# Patient Record
Sex: Male | Born: 1960 | Race: White | Hispanic: No | Marital: Single | Smoking: Current every day smoker
Health system: Southern US, Community
[De-identification: ages and names within clinical notes are randomized; demographics above are authoritative.]

## PROBLEM LIST (undated history)

## (undated) ENCOUNTER — Emergency Department (HOSPITAL_COMMUNITY): Admission: EM | Payer: 59 | Source: Home / Self Care

## (undated) DIAGNOSIS — N489 Disorder of penis, unspecified: Secondary | ICD-10-CM

## (undated) HISTORY — PX: NO PAST SURGERIES: SHX2092

---

## 2015-10-24 ENCOUNTER — Ambulatory Visit (INDEPENDENT_AMBULATORY_CARE_PROVIDER_SITE_OTHER): Payer: Managed Care, Other (non HMO) | Admitting: Physician Assistant

## 2015-10-24 ENCOUNTER — Other Ambulatory Visit: Payer: Self-pay

## 2015-10-24 VITALS — BP 150/100 | HR 97 | Temp 98.3°F | Resp 16 | Ht 69.5 in | Wt 144.8 lb

## 2015-10-24 DIAGNOSIS — L723 Sebaceous cyst: Principal | ICD-10-CM

## 2015-10-24 DIAGNOSIS — L089 Local infection of the skin and subcutaneous tissue, unspecified: Secondary | ICD-10-CM

## 2015-10-24 MED ORDER — DOXYCYCLINE HYCLATE 100 MG PO TABS: 100.0000 mg | ORAL_TABLET | Freq: Two times a day (BID) | ORAL | Status: DC

## 2015-10-24 NOTE — Telephone Encounter (Signed)
Rx sent to [facility], wrong pharmacy.

## 2015-10-24 NOTE — Patient Instructions (Addendum)
Warm compresses Finish all of the antibiotic Pull the packing out in 3 days but please change the dressing daily    IF you received an x-ray today, you will receive an invoice from [facility]. Please contact [facility] at [phone] with questions or concerns regarding your invoice.   IF you received labwork today, you will receive an invoice from [employer]. Please contact [facility] at [phone] with questions or concerns regarding your invoice.   Our billing staff will not be able to assist you with questions regarding bills from these companies.  You will be contacted with the lab results as soon as they are available. The fastest way to get your results is to activate your My Chart account. Instructions are located on the last page of this paperwork. If you have not heard from [place] regarding the results in 2 weeks, please contact this office.

## 2015-10-24 NOTE — Progress Notes (Signed)
   David Townsend  MRN: [id number] DOB: 26-Jul-1960  Subjective:  Pt presents to clinic with area on his left groin that he has had for several days - it is an infection - started as a small red bump but it has gotten bigger and more painful.  He has tried to open it at home and he has not been successful - the area is getting bigger.  He thinks it may have started as an ingrown hair.  There are no active problems to display for this patient.   No current outpatient prescriptions on file prior to visit.   No current facility-administered medications on file prior to visit.    Allergies  Allergen Reactions  . Penicillins     whelps    Review of Systems  Constitutional: Negative for fever and chills.  Skin: Positive for wound.   Objective:  BP 150/100 mmHg  Pulse 97  Temp(Src) 98.3 F (36.8 C) (Oral)  Resp 16  Ht 5' 9.5" (1.765 m)  Wt 144 lb 12.8 oz (65.681 kg)  BMI 21.08 kg/m2  SpO2 99%  Physical Exam  Constitutional: He is oriented to person, place, and time and well-developed, well-nourished, and in no distress.  HENT:  Head: Normocephalic and atraumatic.  Right Ear: External ear normal.  Left Ear: External ear normal.  Eyes: Conjunctivae are normal.  Neck: Normal range of motion.  Pulmonary/Chest: Effort normal.  Neurological: He is alert and oriented to person, place, and time. Gait normal.  Skin: Skin is warm and dry.  Inguinal crease on the left a 1cm raw tissue area without surrounding erythema but induration track inferior  Psychiatric: Mood, memory, affect and judgment normal.   Procedure:  Consent obtained - 2% lido with epi - #11 blade used to open the area - sebaceous msack removed without difficulty  - there was an open track inferior to the wound that had no purulence - 1/4in plain packing placed into the track -drsg placed Assessment and Plan :  Infected sebaceous cyst - Plan: doxycycline (VIBRA-TABS) 100 MG tablet   warm compresses - pull packing out  in 3 days - finish all abx  Windell Hummingbird PA-C  [facility] 10/24/2015 3:18 PM

## 2015-10-25 ENCOUNTER — Telehealth: Payer: Self-pay

## 2015-10-25 NOTE — Telephone Encounter (Signed)
Pt called again wanting to ask Judson Roch some questions.  I gave him David Townsend's response and he said if it is not any better by tomorrow, he will call back.

## 2015-10-25 NOTE — Telephone Encounter (Signed)
Sarah  Patient wants to know if it is okay to put neporin on his wound.  It does not seem to be getting any better.   [phone]

## 2015-10-25 NOTE — Telephone Encounter (Signed)
Keeping taking oral abx.  It is ok to put neosporin on it.  It is going to take a while to look better because of it's size and it takes about 24-48h for the abx to make it better.

## 2015-10-27 ENCOUNTER — Ambulatory Visit (INDEPENDENT_AMBULATORY_CARE_PROVIDER_SITE_OTHER): Payer: Managed Care, Other (non HMO) | Admitting: Physician Assistant

## 2015-10-27 VITALS — BP 148/90 | HR 120 | Temp 98.1°F | Resp 18

## 2015-10-27 DIAGNOSIS — L723 Sebaceous cyst: Secondary | ICD-10-CM

## 2015-10-27 DIAGNOSIS — L089 Local infection of the skin and subcutaneous tissue, unspecified: Secondary | ICD-10-CM

## 2015-10-27 MED ORDER — SULFAMETHOXAZOLE-TRIMETHOPRIM 800-160 MG PO TABS: 1.0000 | ORAL_TABLET | Freq: Two times a day (BID) | ORAL | Status: DC

## 2015-10-27 NOTE — Patient Instructions (Addendum)
Continue the antibiotic as prescribed, and ADD the second antibiotic. Apply a warm compress to the area for 15-20 minutes 2-4 times each day. Change the dressing if it becomes saturated, leaks or falls off.     IF you received an x-ray today, you will receive an invoice from [facility]. Please contact [facility] at [phone] with questions or concerns regarding your invoice.   IF you received labwork today, you will receive an invoice from [employer]. Please contact [facility] at [phone] with questions or concerns regarding your invoice.   Our billing staff will not be able to assist you with questions regarding bills from these companies.  You will be contacted with the lab results as soon as they are available. The fastest way to get your results is to activate your My Chart account. Instructions are located on the last page of this paperwork. If you have not heard from [place] regarding the results in 2 weeks, please contact this office.

## 2015-10-27 NOTE — Progress Notes (Signed)
Chief Complaint  Patient presents with  . Wound Check    left side of groin     History of Present Illness: Patient presents for wound care.  The patient initially presented on 5/22 with a tender, growing lump in the LEFT groin. He was noted to have a raw area in the LEFT inguinal crease with a track inferiorly, but without surrounding erythema. The area was anesthetized and incised, allowing for removal of a sebaceous cyst sack. There was no purulence, but the area was packed due to the track. Doxycycline was prescribed.   Today he reports that he was not improved at all.  He is tolerating the doxycycline without difficulty. No fever, chills. No nausea/vomiting.   Allergies  Allergen Reactions  . Penicillins     whelps    Prior to Admission medications   Medication Sig Start Date End Date Taking? Authorizing Provider  doxycycline (VIBRA-TABS) 100 MG tablet Take 1 tablet (100 mg total) by mouth 2 (two) times daily. 10/24/15  Yes Mancel Bale, PA-C    There are no active problems to display for this patient.    Physical Exam  Constitutional: He is oriented to person, place, and time. He appears well-developed and well-nourished. He is active and cooperative. No distress.  BP 148/90 mmHg  Pulse 120  Temp(Src) 98.1 F (36.7 C) (Oral)  Resp 18  SpO2 98%   Eyes: Conjunctivae are normal.  Pulmonary/Chest: Effort normal.  Neurological: He is alert and oriented to person, place, and time.  Skin:  The dressing and packing removed and the area is noted to be inflamed locally, but there is no surrounding erythema or induration. Palpable track inferiorly, but not tender. Only the raw edges of the wound are tender. Wound cavity irrigated with 1% lidocaine plain and repacked, and dressing applied.   Psychiatric: He has a normal mood and affect. His speech is normal and behavior is normal.      ASSESSMENT & PLAN:  1. Infected sebaceous cyst Add Bactrim DS. Continue local wound  care and warm compresses. RTC in 2 days for re-evaluation. - sulfamethoxazole-trimethoprim (BACTRIM DS,SEPTRA DS) 800-160 MG tablet; Take 1 tablet by mouth 2 (two) times daily.  Dispense: 20 tablet; Refill: 0   Fara Chute, PA-C Physician Assistant-Certified [facility]

## 2015-10-29 ENCOUNTER — Ambulatory Visit (INDEPENDENT_AMBULATORY_CARE_PROVIDER_SITE_OTHER): Payer: Managed Care, Other (non HMO) | Admitting: Physician Assistant

## 2015-10-29 ENCOUNTER — Other Ambulatory Visit: Payer: Self-pay | Admitting: Physician Assistant

## 2015-10-29 VITALS — BP 130/90 | HR 114 | Temp 98.1°F | Resp 18 | Ht 69.5 in | Wt 141.4 lb

## 2015-10-29 DIAGNOSIS — L089 Local infection of the skin and subcutaneous tissue, unspecified: Secondary | ICD-10-CM

## 2015-10-29 DIAGNOSIS — S31104A Unspecified open wound of abdominal wall, left lower quadrant without penetration into peritoneal cavity, initial encounter: Secondary | ICD-10-CM | POA: Diagnosis not present

## 2015-10-29 DIAGNOSIS — L723 Sebaceous cyst: Secondary | ICD-10-CM

## 2015-10-29 DIAGNOSIS — Z125 Encounter for screening for malignant neoplasm of prostate: Secondary | ICD-10-CM

## 2015-10-29 DIAGNOSIS — R739 Hyperglycemia, unspecified: Secondary | ICD-10-CM

## 2015-10-29 LAB — COMPREHENSIVE METABOLIC PANEL
ALT: 292 U/L — ABNORMAL HIGH (ref 9–46)
AST: 1645 U/L — ABNORMAL HIGH (ref 10–35)
Albumin: 4.8 g/dL (ref 3.6–5.1)
Alkaline Phosphatase: 202 U/L — ABNORMAL HIGH (ref 40–115)
BUN: 14 mg/dL (ref 7–25)
CHLORIDE: 95 mmol/L — AB (ref 98–110)
CO2: 23 mmol/L (ref 20–31)
Calcium: 9.4 mg/dL (ref 8.6–10.3)
Creat: 1.24 mg/dL (ref 0.70–1.33)
GLUCOSE: 120 mg/dL — AB (ref 65–99)
POTASSIUM: 4.4 mmol/L (ref 3.5–5.3)
Sodium: 132 mmol/L — ABNORMAL LOW (ref 135–146)
Total Bilirubin: 3.7 mg/dL — ABNORMAL HIGH (ref 0.2–1.2)
Total Protein: 7.2 g/dL (ref 6.1–8.1)

## 2015-10-29 LAB — LIPID PANEL
CHOL/HDL RATIO: 2.5 ratio (ref <=5.0)
Cholesterol: 137 mg/dL (ref 125–200)
HDL: 54 mg/dL (ref >=40)
LDL CALC: 58 mg/dL (ref <130)
TRIGLYCERIDES: 127 mg/dL (ref <150)
VLDL: 25 mg/dL (ref <30)

## 2015-10-29 LAB — POCT CBC
Granulocyte percent: 88.7 %G — AB (ref 37–80)
HEMATOCRIT: 44.9 % (ref 43.5–53.7)
Hemoglobin: 16 g/dL (ref 14.1–18.1)
Lymph, poc: 1 (ref 0.6–3.4)
MCH: 37.1 pg — AB (ref 27–31.2)
MCHC: 35.7 g/dL — AB (ref 31.8–35.4)
MCV: 103.9 fL — AB (ref 80–97)
MID (CBC): 0.2 (ref 0–0.9)
MPV: 7 fL (ref 0–99.8)
POC GRANULOCYTE: 9.3 — AB (ref 2–6.9)
POC LYMPH %: 9.8 % — AB (ref 10–50)
POC MID %: 1.5 %M (ref 0–12)
Platelet Count, POC: 168 10*3/uL (ref 142–424)
RBC: 4.32 M/uL — AB (ref 4.69–6.13)
RDW, POC: 14 %
WBC: 10.5 10*3/uL — AB (ref 4.6–10.2)

## 2015-10-29 LAB — GLUCOSE, POCT (MANUAL RESULT ENTRY): POC Glucose: 133 mg/dl — AB (ref 70–99)

## 2015-10-29 LAB — HEPATITIS C ANTIBODY: HCV Ab: NEGATIVE

## 2015-10-29 LAB — TSH: TSH: 2.06 m[IU]/L (ref 0.40–4.50)

## 2015-10-29 LAB — POCT GLYCOSYLATED HEMOGLOBIN (HGB A1C): HEMOGLOBIN A1C: 5.1

## 2015-10-29 NOTE — Patient Instructions (Addendum)
Continue the antibiotic as prescribed. Apply a warm compress to the area for 15-20 minutes 2-4 times each day. Change the dressing if it becomes saturated, leaks or falls off.     IF you received an x-ray today, you will receive an invoice from [facility]. Please contact [facility] at [phone] with questions or concerns regarding your invoice.   IF you received labwork today, you will receive an invoice from [employer]. Please contact [facility] at [phone] with questions or concerns regarding your invoice.   Our billing staff will not be able to assist you with questions regarding bills from these companies.  You will be contacted with the lab results as soon as they are available. The fastest way to get your results is to activate your My Chart account. Instructions are located on the last page of this paperwork. If you have not heard from [place] regarding the results in 2 weeks, please contact this office.

## 2015-10-29 NOTE — Progress Notes (Signed)
Chief Complaint  Patient presents with  . Wound Check    wound care on sebaceous cyst    History of Present Illness: Patient presents for wound care. He is accompanied today by his father, Mortimer Fries, who asks that we perform screening labs on the patient.  The patient initially presented on 5/22 with a tender, growing lump in the LEFT groin. He was noted to have a raw area in the LEFT inguinal crease with a track inferiorly, but without surrounding erythema. The area was anesthetized and incised, allowing for removal of a sebaceous cyst sack. There was no purulence, but the area was packed due to the track.  Doxycycline was prescribed.  He returned here on 5/25 for wound care, reporting that he was not improved at all. The dressing and packing were removed and the area was noted to be inflamed locally, but there was still no surrounding erythema or induration. There was a palpable track inferiorly, but it was not tender. Only the raw edges of the wound were tender. The wound cavity was irrigated with 1% lidocaine plain and repacked, and Bactrim DS was added to the doxycycline.  Today he reports still no improvement. He is tolerating both antibiotics. No fever, chills, nausea or vomiting.   Allergies  Allergen Reactions  . Penicillins     whelps    Prior to Admission medications   Medication Sig Start Date End Date Taking? Authorizing Provider  doxycycline (VIBRA-TABS) 100 MG tablet Take 1 tablet (100 mg total) by mouth 2 (two) times daily. 10/24/15  Yes Mancel Bale, PA-C  sulfamethoxazole-trimethoprim (BACTRIM DS,SEPTRA DS) 800-160 MG tablet Take 1 tablet by mouth 2 (two) times daily. 10/27/15  Yes Avenir Lozinski, PA-C    There are no active problems to display for this patient.    Physical Exam  Constitutional: He is oriented to person, place, and time. He appears well-developed and well-nourished. He is active and cooperative. He appears distressed (obviously uncomfortable, a bit  anxious appearing, mildly jittery).  BP 130/90 mmHg  Pulse 114  Temp(Src) 98.1 F (36.7 C) (Oral)  Resp 18  Ht 5' 9.5" (1.765 m)  Wt 141 lb 6 oz (64.127 kg)  BMI 20.59 kg/m2  SpO2 98%   Eyes: Conjunctivae are normal.  Pulmonary/Chest: Effort normal.  Neurological: He is alert and oriented to person, place, and time.  Psychiatric: His speech is normal and behavior is normal. His mood appears anxious. His affect is not angry, not blunt, not labile and not inappropriate. He does not exhibit a depressed mood.   Dressing and packing removed. The opening of the wound remains red and very tender. The roof of the wound cavity remains red, but is not tender, and while I can palpate the area of the track inferiorly, it is also not tender. There is no induration and none of the surrounding area is erythematous or tender. There is no exudate of the wound opening or wound bed and no purulence. No surrounding lymphadenopathy. The area was cleansed with gauze and water and bandage applied, with the intention of allowing healing of the wound opening.   Results for orders placed or performed in visit on 10/29/15  POCT CBC  Result Value Ref Range   WBC 10.5 (A) 4.6 - 10.2 K/uL   Lymph, poc 1.0 0.6 - 3.4   POC LYMPH PERCENT 9.8 (A) 10 - 50 %L   MID (cbc) 0.2 0 - 0.9   POC MID % 1.5 0 - 12 %M  POC Granulocyte 9.3 (A) 2 - 6.9   Granulocyte percent 88.7 (A) 37 - 80 %G   RBC 4.32 (A) 4.69 - 6.13 M/uL   Hemoglobin 16.0 14.1 - 18.1 g/dL   HCT, POC 44.9 43.5 - 53.7 %   MCV 103.9 (A) 80 - 97 fL   MCH, POC 37.1 (A) 27 - 31.2 pg   MCHC 35.7 (A) 31.8 - 35.4 g/dL   RDW, POC 14.0 %   Platelet Count, POC 168 142 - 424 K/uL   MPV 7.0 0 - 99.8 fL  POCT glucose (manual entry)  Result Value Ref Range   POC Glucose 133 (A) 70 - 99 mg/dl  POCT glycosylated hemoglobin (Hb A1C)  Result Value Ref Range   Hemoglobin A1C 5.1      ASSESSMENT & PLAN:  1. Infected sebaceous cyst 2. Non-healing open wound of left  groin, initial encounter Unclear why he remains so tender, without any evidence of persistent infection. As the significant pain is at the wound opening, intend to allow healing to see if there is improvement with that. If not, will recommend evaluation with general surgery. - POCT CBC - POCT glucose (manual entry) - Hepatitis C antibody - HIV antibody - Lipid panel - Comprehensive metabolic panel - TSH - RPR  3. Screening for prostate cancer - PSA  4. Hyperglycemia Normal A1C. - POCT glycosylated hemoglobin (Hb A1C)   Fara Chute, PA-C Physician Assistant-Certified [facility]

## 2015-10-30 LAB — HIV ANTIBODY (ROUTINE TESTING W REFLEX): HIV 1&2 Ab, 4th Generation: NONREACTIVE

## 2015-10-31 LAB — PSA: PSA: 2.79 ng/mL (ref <=4.00)

## 2015-11-01 ENCOUNTER — Ambulatory Visit (INDEPENDENT_AMBULATORY_CARE_PROVIDER_SITE_OTHER): Payer: Managed Care, Other (non HMO) | Admitting: Physician Assistant

## 2015-11-01 VITALS — BP 122/72 | HR 100 | Temp 98.3°F | Resp 17 | Ht 69.5 in | Wt 142.0 lb

## 2015-11-01 DIAGNOSIS — L723 Sebaceous cyst: Secondary | ICD-10-CM

## 2015-11-01 DIAGNOSIS — F1010 Alcohol abuse, uncomplicated: Secondary | ICD-10-CM

## 2015-11-01 DIAGNOSIS — L089 Local infection of the skin and subcutaneous tissue, unspecified: Secondary | ICD-10-CM

## 2015-11-01 DIAGNOSIS — R748 Abnormal levels of other serum enzymes: Secondary | ICD-10-CM

## 2015-11-01 LAB — HEPATITIS PANEL, ACUTE
HCV Ab: NEGATIVE
HEP A IGM: NONREACTIVE
Hep B C IgM: NONREACTIVE
Hepatitis B Surface Ag: NEGATIVE

## 2015-11-01 NOTE — Progress Notes (Signed)
   David Townsend  MRN: [id number] DOB: 07-04-1960  Subjective:  Pt presents to clinic for wound recheck.  The pain is much better over the last 2 days.  He is taking and tolerating the abx ok.  He has been off work and he is planning on getting back to work but not doing much movement.   He has not heard about his labs. He did them mainly for his dad because the patient is not worried about anything.  He feels fine. ETOH - daily use of beer - 40-50 beers a week, 3-4 shots vodka a week, - for about for about 15 years - less EOTH use prior to this then but still daily from about teenager Smoker - 2 ppd - for 34 years  There are no active problems to display for this patient.   Current Outpatient Prescriptions on File Prior to Visit  Medication Sig Dispense Refill  . doxycycline (VIBRA-TABS) 100 MG tablet Take 1 tablet (100 mg total) by mouth 2 (two) times daily. 20 tablet 0  . sulfamethoxazole-trimethoprim (BACTRIM DS,SEPTRA DS) 800-160 MG tablet Take 1 tablet by mouth 2 (two) times daily. 20 tablet 0   No current facility-administered medications on file prior to visit.    Allergies  Allergen Reactions  . Penicillins     whelps    Review of Systems  Constitutional: Negative for fever and chills.  Skin: Positive for wound.   Objective:  BP 122/72 mmHg  Pulse 100  Temp(Src) 98.3 F (36.8 C) (Oral)  Resp 17  Ht 5' 9.5" (1.765 m)  Wt 142 lb (64.411 kg)  BMI 20.68 kg/m2  SpO2 99%  Physical Exam  Constitutional: He is oriented to person, place, and time and well-developed, well-nourished, and in no distress.  HENT:  Head: Normocephalic and atraumatic.  Right Ear: External ear normal.  Left Ear: External ear normal.  Eyes: Conjunctivae are normal. No scleral icterus.  Neck: Normal range of motion.  Pulmonary/Chest: Effort normal.  Neurological: He is alert and oriented to person, place, and time. Gait normal.  Skin: Skin is warm and dry.  Scabbed over wound in his groin  - erythema still present but improved from last visit when I saw him - the is no fluctuance but there is still induration present - no TTP  No jaundice  Psychiatric: Mood, memory, affect and judgment normal.    Assessment and Plan :  Infected sebaceous cyst - finish abx - warm compresses - continue to watch but no f/u for this is needed  Elevated liver enzymes   D/w pt his lab work and extremely elevated liver enzymes - he feels fine - we will wait the pending acute hepatitis panel and [place] of liver - we discussed decreasing his ETOH intake but not completely stopping due to withdraw.  Windell Hummingbird PA-C  [facility] 11/01/2015 3:27 PM

## 2015-11-01 NOTE — Patient Instructions (Signed)
     IF you received an x-ray today, you will receive an invoice from [facility]. Please contact [facility] at [phone] with questions or concerns regarding your invoice.   IF you received labwork today, you will receive an invoice from [facility]. Please contact [facility] at [phone] with questions or concerns regarding your invoice.   Our billing staff will not be able to assist you with questions regarding bills from these companies.  You will be contacted with the lab results as soon as they are available. The fastest way to get your results is to activate your My Chart account. Instructions are located on the last page of this paperwork. If you have not heard from us regarding the results in 2 weeks, please contact this office.      

## 2015-11-02 LAB — RPR

## 2015-11-10 ENCOUNTER — Ambulatory Visit
Admission: RE | Admit: 2015-11-10 | Discharge: 2015-11-10 | Disposition: A | Discharge status: Home / Self Care | Payer: Managed Care, Other (non HMO) | Source: Ambulatory Visit | Attending: Physician Assistant | Admitting: Physician Assistant

## 2015-11-10 ENCOUNTER — Telehealth: Payer: Self-pay | Admitting: Radiology

## 2015-11-10 ENCOUNTER — Other Ambulatory Visit: Payer: Self-pay | Admitting: Physician Assistant

## 2015-11-10 DIAGNOSIS — R7989 Other specified abnormal findings of blood chemistry: Secondary | ICD-10-CM

## 2015-11-10 DIAGNOSIS — R748 Abnormal levels of other serum enzymes: Secondary | ICD-10-CM

## 2015-11-10 DIAGNOSIS — F1010 Alcohol abuse, uncomplicated: Secondary | ICD-10-CM

## 2015-11-10 DIAGNOSIS — R945 Abnormal results of liver function studies: Principal | ICD-10-CM

## 2015-11-10 NOTE — Telephone Encounter (Signed)
I see your note about seeing a specialist but do not see a referral. What type of specialist would you like him to see?

## 2015-11-10 NOTE — Telephone Encounter (Signed)
Pt called in stating that someone called and left a VM for him earlier today. Pt states that the person on the VM stated that he needs to see another specialist. Nothing was documented regarding a phone call.

## 2015-11-10 NOTE — Telephone Encounter (Signed)
The information is in result note from an abd [place] it is likely in the CMA/Rad pool

## 2015-11-15 ENCOUNTER — Telehealth: Payer: Self-pay

## 2015-11-15 DIAGNOSIS — R7989 Other specified abnormal findings of blood chemistry: Secondary | ICD-10-CM

## 2015-11-15 DIAGNOSIS — R945 Abnormal results of liver function studies: Principal | ICD-10-CM

## 2015-11-15 NOTE — Telephone Encounter (Signed)
Pt has still not heard anything about what is going on with his liver levels.  Says he was told there was something wrong, then received a call from an "unknown" person that his liver was fine.  He also mentioned other tests and a [facility] visit.  (See previous notes). The last phone messages were from 11-10-15, but he says he has called several times about this.  Please call him at [phone] and if he cant answer he requests that you leave him a number that he can specifically call you directly.

## 2015-11-18 NOTE — Telephone Encounter (Signed)
That is fine - - I would like the patient to RTC in 1 month for a lab only visit which I have placed for lab work only to make sure it is not getting worse  Referral - can we please cancel the referral to GI

## 2015-11-18 NOTE — Telephone Encounter (Signed)
Please call patient.  His liver enzymes were elevated which we talked about were likely related to his alcohol use.  The [place] that we did showed that his liver was not enlarged and had no masses.  It did has some fatty infiltrate but this is nothing something that would cause his LFTs to be as elevated as his were.  There are a few other conditions other than alcohol use that can cause high LFTs and a GI specialist would be the best person to do this type of work-up.  Please give him this information and if he still has further questions I will call him.

## 2015-11-18 NOTE — Telephone Encounter (Signed)
Patient states that he would like to cut back on the alcohol use for now and see if this will help before he goes to see a GI specialist. Patient states that if you do not agree with that plan of care please give him a call.

## 2015-11-19 NOTE — Telephone Encounter (Signed)
Spoke with pt. He states he will come back for lab in 1 month and he will cut back on drinking beer.

## 2015-12-12 NOTE — Progress Notes (Signed)
Pt states he is planning on come back soon. I informed him of our new appt only policy and he stated he will call back to make an appt in the near future.

## 2018-04-29 ENCOUNTER — Emergency Department (HOSPITAL_COMMUNITY): Payer: 59

## 2018-04-29 ENCOUNTER — Encounter (HOSPITAL_COMMUNITY): Payer: Self-pay | Admitting: Emergency Medicine

## 2018-04-29 ENCOUNTER — Other Ambulatory Visit: Payer: Self-pay

## 2018-04-29 ENCOUNTER — Emergency Department (HOSPITAL_COMMUNITY)
Admission: EM | Admit: 2018-04-29 | Discharge: 2018-04-30 | Disposition: A | Discharge status: Home / Self Care | Payer: 59 | Attending: Emergency Medicine | Admitting: Emergency Medicine

## 2018-04-29 DIAGNOSIS — Z79899 Other long term (current) drug therapy: Secondary | ICD-10-CM | POA: Diagnosis not present

## 2018-04-29 DIAGNOSIS — F17210 Nicotine dependence, cigarettes, uncomplicated: Secondary | ICD-10-CM | POA: Insufficient documentation

## 2018-04-29 DIAGNOSIS — R079 Chest pain, unspecified: Secondary | ICD-10-CM | POA: Insufficient documentation

## 2018-04-29 DIAGNOSIS — R531 Weakness: Secondary | ICD-10-CM | POA: Diagnosis not present

## 2018-04-29 LAB — I-STAT TROPONIN, ED: TROPONIN I, POC: 0 ng/mL (ref 0.00–0.08)

## 2018-04-29 LAB — URINALYSIS, ROUTINE W REFLEX MICROSCOPIC
Bilirubin Urine: NEGATIVE
Glucose, UA: NEGATIVE mg/dL
Hgb urine dipstick: NEGATIVE
KETONES UR: NEGATIVE mg/dL
LEUKOCYTES UA: NEGATIVE
NITRITE: NEGATIVE
PH: 6 (ref 5.0–8.0)
Protein, ur: NEGATIVE mg/dL
Specific Gravity, Urine: 1.003 — ABNORMAL LOW (ref 1.005–1.030)

## 2018-04-29 LAB — CBC WITH DIFFERENTIAL/PLATELET
ABS IMMATURE GRANULOCYTES: 0.03 10*3/uL (ref 0.00–0.07)
Basophils Absolute: 0.1 10*3/uL (ref 0.0–0.1)
Basophils Relative: 1 %
EOS ABS: 0.1 10*3/uL (ref 0.0–0.5)
Eosinophils Relative: 2 %
HEMATOCRIT: 41.6 % (ref 39.0–52.0)
Hemoglobin: 14.1 g/dL (ref 13.0–17.0)
Immature Granulocytes: 0 %
LYMPHS ABS: 2.2 10*3/uL (ref 0.7–4.0)
Lymphocytes Relative: 32 %
MCH: 36.8 pg — AB (ref 26.0–34.0)
MCHC: 33.9 g/dL (ref 30.0–36.0)
MCV: 108.6 fL — AB (ref 80.0–100.0)
MONO ABS: 0.6 10*3/uL (ref 0.1–1.0)
Monocytes Relative: 9 %
Neutro Abs: 3.8 10*3/uL (ref 1.7–7.7)
Neutrophils Relative %: 56 %
Platelets: 178 10*3/uL (ref 150–400)
RBC: 3.83 MIL/uL — ABNORMAL LOW (ref 4.22–5.81)
RDW: 13.6 % (ref 11.5–15.5)
WBC: 6.8 10*3/uL (ref 4.0–10.5)
nRBC: 0 % (ref 0.0–0.2)

## 2018-04-29 LAB — BASIC METABOLIC PANEL
Anion gap: 13 (ref 5–15)
BUN: 6 mg/dL (ref 6–20)
CO2: 24 mmol/L (ref 22–32)
Calcium: 9 mg/dL (ref 8.9–10.3)
Chloride: 103 mmol/L (ref 98–111)
Creatinine, Ser: 0.78 mg/dL (ref 0.61–1.24)
GFR calc Af Amer: >60 mL/min (ref >60)
GFR calc non Af Amer: >60 mL/min (ref >60)
Glucose, Bld: 99 mg/dL (ref 70–99)
POTASSIUM: 4.2 mmol/L (ref 3.5–5.1)
Sodium: 140 mmol/L (ref 135–145)

## 2018-04-29 NOTE — ED Triage Notes (Signed)
Pt arriving with chest pain that began 3 days ago and bilateral leg weakness. Pt states the chest pain is "around the sides".

## 2018-04-29 NOTE — ED Provider Notes (Signed)
[name] Provider Note   CSN: [id number] Arrival date & time: 04/29/18  1957     History   Chief Complaint Chief Complaint  Patient presents with  . Chest Pain    HPI David Townsend is a 57 y.o. male.  Patient presents to the ED with complaint of anterior chest discomfort and bilateral lower extremity weakness. The weakness in his legs resulted in a fall at home last night. Chest pain is not substernal, but is located to the lateral aspect of the chest. Patient denies shortness of breath. No headache or visual disturbance. No back pain or prior back injury. Patient states he feels like his thighs are weak.  The history is provided by the patient. No language interpreter was used.  Chest Pain   This is a new problem. The current episode started 2 days ago. The problem occurs constantly. The problem has not changed since onset.The pain is associated with movement. The pain is present in the lateral region. The pain is mild. The quality of the pain is described as pleuritic. The pain does not radiate. Associated symptoms include weakness. Risk factors include alcohol intake.    History reviewed. No pertinent past medical history.  Patient Active Problem List   Diagnosis Date Noted  . ETOH abuse 11/01/2015    History reviewed. No pertinent surgical history.      Home Medications    Prior to Admission medications   Medication Sig Start Date End Date Taking? Authorizing Provider  doxycycline (VIBRA-TABS) 100 MG tablet Take 1 tablet (100 mg total) by mouth 2 (two) times daily. 10/24/15   Weber, Damaris Hippo, PA-C  sulfamethoxazole-trimethoprim (BACTRIM DS,SEPTRA DS) 800-160 MG tablet Take 1 tablet by mouth 2 (two) times daily. 10/27/15   Harrison Mons, PA    Family History No family history on file.  Social History Social History   Tobacco Use  . Smoking status: Current Every Day Smoker    Packs/day: 2.00    Years: 34.00    Pack years:  68.00    Types: Cigarettes  . Smokeless tobacco: Never Used  Substance Use Topics  . Alcohol use: Yes    Alcohol/week: 0.0 standard drinks    Comment: 40-50 drinks a week  . Drug use: No     Allergies   Penicillins   Review of Systems Review of Systems  Constitutional: Positive for activity change.  Cardiovascular: Positive for chest pain.  Neurological: Positive for weakness.  All other systems reviewed and are negative.    Physical Exam Updated Vital Signs BP 135/85 (BP Location: Right Arm)   Pulse 95   Temp 98.2 F (36.8 C) (Oral)   Resp 16   Ht 5\' 10"  (1.778 m)   Wt 68 kg   SpO2 96%   BMI 21.52 kg/m   Physical Exam  Constitutional: He is oriented to person, place, and time. He appears well-developed and well-nourished.  HENT:  Head: Atraumatic.  Eyes: EOM are normal.  Neck: Neck supple.  Cardiovascular: Normal rate and regular rhythm.  Pulmonary/Chest: Effort normal and breath sounds normal.  Abdominal: Soft.  Musculoskeletal:       Right lower leg: Normal.       Left lower leg: Normal.  Neurological: He is alert and oriented to person, place, and time. He has normal strength. No sensory deficit. GCS eye subscore is 4. GCS verbal subscore is 5. GCS motor subscore is 6.  No demonstrable lower extremity weakness elicited on exam.  Skin: Skin is warm and dry.  Psychiatric: He has a normal mood and affect.  Nursing note and vitals reviewed.    ED Treatments / Results  Labs (all labs ordered are listed, but only abnormal results are displayed) Labs Reviewed  CBC WITH DIFFERENTIAL/PLATELET - Abnormal; Notable for the following components:      Result Value   RBC 3.83 (*)    MCV 108.6 (*)    MCH 36.8 (*)    All other components within normal limits  BASIC METABOLIC PANEL  I-STAT TROPONIN, ED    EKG EKG Interpretation  Date/Time:  Tuesday April 29 2018 20:20:29 EST Ventricular Rate:  93 PR Interval:    QRS Duration: 105 QT  Interval:  351 QTC Calculation: 437 R Axis:   65 Text Interpretation:  Sinus rhythm Probable left atrial enlargement RSR' in V1 or V2, right VCD or RVH Borderline ST elevation, anterior leads No old tracing to compare Confirmed by Dorie Rank [phone]) on 04/29/2018 9:20:41 PM   Radiology Dg Chest 2 View  Result Date: 04/29/2018 CLINICAL DATA:  Chest pain EXAM: CHEST - 2 VIEW COMPARISON:  None. FINDINGS: The heart size and mediastinal contours are within normal limits. Both lungs are clear. The visualized skeletal structures are unremarkable. IMPRESSION: No active cardiopulmonary disease. Electronically Signed   By: Donavan Foil M.D.   On: 04/29/2018 21:21    Procedures Procedures (including critical care time)  Medications Ordered in ED Medications - No data to display   Initial Impression / Assessment and Plan / ED Course  I have reviewed the triage vital signs and the nursing notes.  Pertinent labs & imaging results that were available during my care of the patient were reviewed by me and considered in my medical decision making (see chart for details).     Patient is to be discharged with recommendation to follow up with PCP in regards to today's hospital visit. Chest pain is not likely of cardiac or pulmonary etiology d/t presentation, perc negative, VSS, no tracheal deviation, no JVD or new murmur, RRR, breath sounds equal bilaterally, EKG without acute abnormalities, negative troponin, and negative CXR. Pt has been advised to return to the ED is CP becomes exertional, associated with diaphoresis or nausea, radiates to left jaw/arm, worsens or becomes concerning in any way. Pt appears reliable for follow up and is agreeable to discharge.   Case has been discussed with  Dr. Tomi Bamberger who agrees with the above plan to discharge.   Final Clinical Impressions(s) / ED Diagnoses   Final diagnoses:  Nonspecific chest pain    ED Discharge Orders    None       [name],  NP 04/30/18 [name]    Dorie Rank, MD 04/30/18 1859

## 2018-04-29 NOTE — ED Notes (Signed)
Urine sample at bedside

## 2018-04-29 NOTE — ED Notes (Signed)
Called pt from lobby to triage x1 No response

## 2018-04-30 LAB — I-STAT TROPONIN, ED: TROPONIN I, POC: 0.01 ng/mL (ref 0.00–0.08)

## 2019-06-21 ENCOUNTER — Ambulatory Visit: Payer: Self-pay | Attending: Internal Medicine

## 2019-06-21 DIAGNOSIS — Z23 Encounter for immunization: Secondary | ICD-10-CM | POA: Insufficient documentation

## 2019-06-21 NOTE — Progress Notes (Signed)
   Covid-19 Vaccination Clinic  Name:  David Townsend    MRN: [id number] DOB: Apr 09, 1961  06/21/2019  David Townsend was observed post Covid-19 immunization for 15 minutes without incidence. He was provided with Vaccine Information Sheet and instruction to access the V-Safe system.   David Townsend was instructed to call 911 with any severe reactions post vaccine: Marland Kitchen Difficulty breathing  . Swelling of your face and throat  . A fast heartbeat  . A bad rash all over your body  . Dizziness and weakness   Left 11:25

## 2019-07-12 ENCOUNTER — Ambulatory Visit: Payer: Self-pay | Attending: Internal Medicine

## 2019-07-12 DIAGNOSIS — Z23 Encounter for immunization: Secondary | ICD-10-CM | POA: Insufficient documentation

## 2019-07-12 NOTE — Progress Notes (Signed)
   Covid-19 Vaccination Clinic  Name:  David Townsend    MRN: [id number] DOB: [birth year]  07/12/2019  David Townsend was observed post Covid-19 immunization for 15 minutes without incidence. He was provided with Vaccine Information Sheet and instruction to access the V-Safe system.   David Townsend was instructed to call 911 with any severe reactions post vaccine: Marland Kitchen Difficulty breathing  . Swelling of your face and throat  . A fast heartbeat  . A bad rash all over your body  . Dizziness and weakness    Immunizations Administered    Name Date Dose VIS Date Route   Pfizer COVID-19 Vaccine 07/12/2019 10:44 AM 0.3 mL 05/15/2019 Intramuscular   Manufacturer: [place]   Lot: [id number]   [place]: [id number]

## 2019-08-12 ENCOUNTER — Ambulatory Visit: Payer: Self-pay | Admitting: *Deleted

## 2019-08-12 ENCOUNTER — Ambulatory Visit
Admission: EM | Admit: 2019-08-12 | Discharge: 2019-08-12 | Disposition: A | Discharge status: Home / Self Care | Payer: Self-pay | Attending: Internal Medicine | Admitting: Internal Medicine

## 2019-08-12 ENCOUNTER — Other Ambulatory Visit: Payer: Self-pay

## 2019-08-12 ENCOUNTER — Ambulatory Visit (INDEPENDENT_AMBULATORY_CARE_PROVIDER_SITE_OTHER): Payer: Self-pay

## 2019-08-12 ENCOUNTER — Emergency Department (HOSPITAL_COMMUNITY)
Admission: EM | Admit: 2019-08-12 | Discharge: 2019-08-12 | Disposition: A | Discharge status: Home / Self Care | Payer: Self-pay | Attending: Emergency Medicine | Admitting: Emergency Medicine

## 2019-08-12 ENCOUNTER — Encounter: Payer: Self-pay | Admitting: Emergency Medicine

## 2019-08-12 DIAGNOSIS — R109 Unspecified abdominal pain: Secondary | ICD-10-CM

## 2019-08-12 DIAGNOSIS — R102 Pelvic and perineal pain: Secondary | ICD-10-CM

## 2019-08-12 DIAGNOSIS — F17210 Nicotine dependence, cigarettes, uncomplicated: Secondary | ICD-10-CM | POA: Insufficient documentation

## 2019-08-12 DIAGNOSIS — R140 Abdominal distension (gaseous): Secondary | ICD-10-CM

## 2019-08-12 DIAGNOSIS — R1030 Lower abdominal pain, unspecified: Secondary | ICD-10-CM | POA: Insufficient documentation

## 2019-08-12 DIAGNOSIS — R339 Retention of urine, unspecified: Secondary | ICD-10-CM | POA: Insufficient documentation

## 2019-08-12 DIAGNOSIS — E871 Hypo-osmolality and hyponatremia: Secondary | ICD-10-CM | POA: Insufficient documentation

## 2019-08-12 DIAGNOSIS — R338 Other retention of urine: Secondary | ICD-10-CM

## 2019-08-12 LAB — BASIC METABOLIC PANEL
Anion gap: 13 (ref 5–15)
BUN: 10 mg/dL (ref 6–20)
CO2: 22 mmol/L (ref 22–32)
Calcium: 9 mg/dL (ref 8.9–10.3)
Chloride: 94 mmol/L — ABNORMAL LOW (ref 98–111)
Creatinine, Ser: 0.78 mg/dL (ref 0.61–1.24)
GFR calc Af Amer: >60 mL/min (ref >60)
GFR calc non Af Amer: >60 mL/min (ref >60)
Glucose, Bld: 84 mg/dL (ref 70–99)
Potassium: 4 mmol/L (ref 3.5–5.1)
Sodium: 129 mmol/L — ABNORMAL LOW (ref 135–145)

## 2019-08-12 LAB — CBC
HCT: 35 % — ABNORMAL LOW (ref 39.0–52.0)
Hemoglobin: 12 g/dL — ABNORMAL LOW (ref 13.0–17.0)
MCH: 40.1 pg — ABNORMAL HIGH (ref 26.0–34.0)
MCHC: 34.3 g/dL (ref 30.0–36.0)
MCV: 117.1 fL — ABNORMAL HIGH (ref 80.0–100.0)
Platelets: 154 10*3/uL (ref 150–400)
RBC: 2.99 MIL/uL — ABNORMAL LOW (ref 4.22–5.81)
RDW: 13.4 % (ref 11.5–15.5)
WBC: 8.7 10*3/uL (ref 4.0–10.5)
nRBC: 0 % (ref 0.0–0.2)

## 2019-08-12 LAB — POCT URINALYSIS DIP (MANUAL ENTRY)
Bilirubin, UA: NEGATIVE
Glucose, UA: NEGATIVE mg/dL
Leukocytes, UA: NEGATIVE
Nitrite, UA: NEGATIVE
Protein Ur, POC: NEGATIVE mg/dL
Spec Grav, UA: 1.015 (ref 1.010–1.025)
Urobilinogen, UA: 0.2 E.U./dL
pH, UA: 6.5 (ref 5.0–8.0)

## 2019-08-12 LAB — URINALYSIS, ROUTINE W REFLEX MICROSCOPIC
Bilirubin Urine: NEGATIVE
Glucose, UA: NEGATIVE mg/dL
Hgb urine dipstick: NEGATIVE
Ketones, ur: 5 mg/dL — AB
Leukocytes,Ua: NEGATIVE
Nitrite: NEGATIVE
Protein, ur: NEGATIVE mg/dL
Specific Gravity, Urine: 1.01 (ref 1.005–1.030)
pH: 6 (ref 5.0–8.0)

## 2019-08-12 MED ORDER — TAMSULOSIN HCL 0.4 MG PO CAPS: 0.4000 mg | ORAL_CAPSULE | Freq: Two times a day (BID) | ORAL | 0 refills | Status: AC

## 2019-08-12 MED ORDER — LIDOCAINE HCL URETHRAL/MUCOSAL 2 % EX GEL
1.0000 "application " | Freq: Once | CUTANEOUS | Status: AC
  Administered 2019-08-12: 1 via URETHRAL
  Filled 2019-08-12: qty 30

## 2019-08-12 NOTE — ED Triage Notes (Addendum)
Pt presents to [facility] for assessment of 3 days of lower, mid abdominal pain.  States he took a stool softener without relief of pain.  Denies n/v/d.  Pt c/o of "dribbles of urine" since pain began.  Pt c/o worsening of pain with standing, and the pain also comes in 20 minute "waves" of worsening.

## 2019-08-12 NOTE — ED Notes (Signed)
Catheter unclamped per MD request.

## 2019-08-12 NOTE — Discharge Instructions (Signed)
Lower abdominal/pelvic pain, abdominal distention and xray suggestive of massively distended bladder

## 2019-08-12 NOTE — ED Triage Notes (Signed)
Pt reports that was seen at [facility] today and has scan down and advised to go to ED due to distended bladder. Reports having trouble urinating and lower abd pains since Saturday.

## 2019-08-12 NOTE — ED Notes (Signed)
Standard drainage bag removed and leg bag applied. Instructed patient on use of leg bag. Patient verbalizes understanding.

## 2019-08-12 NOTE — ED Notes (Signed)
ED Provider at bedside. 

## 2019-08-12 NOTE — ED Provider Notes (Signed)
[facility] DEPT Provider Note   CSN: [id number] Arrival date & time: 08/12/19  1251     History Chief Complaint  Patient presents with  . Urinary Retention    David Townsend is a 59 y.o. male.  59 year old male with past medical history below who presents with urinary retention.  Patient states that 4 days ago he began having difficulty urinating.  He noticed that he was not able to urinate much when he sat on the toilet and had suprapubic abdominal pain.  He seemed to be able to urinate a little better in a hot shower.  He urinated a little bit this morning but his symptoms have been progressively worsening over the past 4 days.  He denies any vomiting, fevers, or other complaints.  He denies history of urinary retention but he does endorse nocturia, usually waking up 2-3 times per night to void.  The history is provided by the patient.       No past medical history on file.  Patient Active Problem List   Diagnosis Date Noted  . ETOH abuse 11/01/2015    No past surgical history on file.     Family History  Family history unknown: Yes    Social History   Tobacco Use  . Smoking status: Current Every Day Smoker    Packs/day: 2.00    Years: 34.00    Pack years: 68.00    Types: Cigarettes  . Smokeless tobacco: Never Used  Substance Use Topics  . Alcohol use: Yes    Alcohol/week: 0.0 standard drinks    Comment: 40-50 drinks a week  . Drug use: No    Home Medications Prior to Admission medications   Medication Sig Start Date End Date Taking? Authorizing Provider  alum & mag hydroxide-simeth (MAALOX/MYLANTA) 200-200-20 MG/5ML suspension Take 15 mLs by mouth every 6 (six) hours as needed for indigestion or heartburn.   Yes [provider]  bismuth subsalicylate (PEPTO BISMOL) 262 MG/15ML suspension Take 30 mLs by mouth every 6 (six) hours as needed for indigestion or diarrhea or loose stools.   Yes [provider]   tamsulosin (FLOMAX) 0.4 MG CAPS capsule Take 1 capsule (0.4 mg total) by mouth 2 (two) times daily for 14 days. 08/12/19 08/26/19  Little, Wenda Overland, MD    Allergies    Penicillins  Review of Systems   Review of Systems All other systems reviewed and are negative except that which was mentioned in HPI  Physical Exam Updated Vital Signs BP (!) 158/91   Pulse 94   Temp 98.3 F (36.8 C) (Oral)   Resp 14   SpO2 100%   Physical Exam Vitals and nursing note reviewed.  Constitutional:      General: He is not in acute distress.    Appearance: He is well-developed.  HENT:     Head: Normocephalic and atraumatic.  Eyes:     Conjunctiva/sclera: Conjunctivae normal.  Cardiovascular:     Rate and Rhythm: Normal rate and regular rhythm.     Heart sounds: Normal heart sounds. No murmur.  Pulmonary:     Effort: Pulmonary effort is normal.     Breath sounds: Normal breath sounds.  Abdominal:     General: Bowel sounds are normal. There is distension.     Comments: Severely distended and firm bladder palpable above umbilicus, no LUQ or RUQ tenderness  Musculoskeletal:     Cervical back: Neck supple.  Skin:    General: Skin is warm  and dry.  Neurological:     Mental Status: He is alert and oriented to person, place, and time.     Comments: Fluent speech  Psychiatric:        Judgment: Judgment normal.     ED Results / Procedures / Treatments   Labs (all labs ordered are listed, but only abnormal results are displayed) Labs Reviewed  URINALYSIS, ROUTINE W REFLEX MICROSCOPIC - Abnormal; Notable for the following components:      Result Value   Ketones, ur 5 (*)    All other components within normal limits  BASIC METABOLIC PANEL - Abnormal; Notable for the following components:   Sodium 129 (*)    Chloride 94 (*)    All other components within normal limits  CBC - Abnormal; Notable for the following components:   RBC 2.99 (*)    Hemoglobin 12.0 (*)    HCT 35.0 (*)    MCV  117.1 (*)    MCH 40.1 (*)    All other components within normal limits    EKG None  Radiology DG Abd 2 Views  Result Date: 08/12/2019 CLINICAL DATA:  Abdominal pain and bloating for 4 days. EXAM: ABDOMEN - 2 VIEW COMPARISON:  None. FINDINGS: There is a large mass projecting out of the pelvis and up into the abdomen. I suspect this is a massively distended bladder. Displacement of the bowel is noted but no findings for small bowel obstruction or free air. The soft tissue shadows of the abdomen are grossly maintained. No worrisome calcifications. The lung bases are clear. The bony structures are intact. IMPRESSION: Large mass projecting out of the pelvis and up into the abdomen. I suspect this is a massively distended bladder. Electronically Signed   By: Marijo Sanes M.D.   On: 08/12/2019 12:09    Procedures Procedures (including critical care time)  Medications Ordered in ED Medications  lidocaine (XYLOCAINE) 2 % jelly 1 application (has no administration in time range)    ED Course  I have reviewed the triage vital signs and the nursing notes.  Pertinent labs that were available during my care of the patient were reviewed by me and considered in my medical decision making (see chart for details).    MDM Rules/Calculators/A&P                      Severe bladder distension on exam. Foley placed with >3043ml urine out. Clamped at one point and observed prior to unclamping again. Pt remained hemodynamically stable on cardiac monitor. LAbs show normal creatinine, Na 129. UA normal.  I discussed follow-up with urology for further management and we will start the patient on Flomax for suspected BPH.  I recommended follow-up with PCP regarding mild hyponatremia. Final Clinical Impression(s) / ED Diagnoses Final diagnoses:  Acute urinary retention  Hyponatremia    Rx / DC Orders ED Discharge Orders         Ordered    tamsulosin (FLOMAX) 0.4 MG CAPS capsule  2 times daily      08/12/19 1547           Little, Wenda Overland, MD 08/12/19 1550

## 2019-08-12 NOTE — ED Provider Notes (Addendum)
[facility]    CSN: [id number] Arrival date & time: 08/12/19  1105      History   Chief Complaint Chief Complaint  Patient presents with  . Abdominal Pain    HPI David Townsend is a 59 y.o. male history of alcohol abuse, tobacco use, presenting today for evaluation of abdominal pain.  Patient states that over the past 4 days he has had lower abdominal pain.  Initially attributed this to upset stomach and tried some Mylanta and Pepto-Bismol without relief.  He then tried using a laxative as he thought he may be constipated, but this is not helped.  He reports watery bowels prior to onset of pain as well.  Pain has felt lower over his pelvic area.  Has noted some decreased urination and occasional dribbling of urine.  Denies dysuria, increased frequency or urgency.  Denies associated nausea or vomiting.  Denies testicle pain or swelling.  Patient does report he has noticed his abdomen being more distended progressively over the past 6 months.  Denies history of any prostate issues or prior UTIs.  Denies history of kidney stones.  HPI  History reviewed. No pertinent past medical history.  Patient Active Problem List   Diagnosis Date Noted  . ETOH abuse 11/01/2015    History reviewed. No pertinent surgical history.     Home Medications    Prior to Admission medications   Not on File    Family History Family History  Family history unknown: Yes    Social History Social History   Tobacco Use  . Smoking status: Current Every Day Smoker    Packs/day: 2.00    Years: 34.00    Pack years: 68.00    Types: Cigarettes  . Smokeless tobacco: Never Used  Substance Use Topics  . Alcohol use: Yes    Alcohol/week: 0.0 standard drinks    Comment: 40-50 drinks a week  . Drug use: No     Allergies   Penicillins   Review of Systems Review of Systems  Constitutional: Negative for fever.  HENT: Negative for sore throat.   Respiratory: Negative for shortness of  breath.   Cardiovascular: Negative for chest pain.  Gastrointestinal: Positive for abdominal pain. Negative for constipation, diarrhea, nausea and vomiting.  Genitourinary: Positive for decreased urine volume. Negative for difficulty urinating, discharge, dysuria, frequency, penile pain, penile swelling, scrotal swelling and testicular pain.  Skin: Negative for rash.  Neurological: Negative for dizziness, light-headedness and headaches.     Physical Exam Triage Vital Signs ED Triage Vitals  Enc Vitals Group     BP 08/12/19 1112 (!) 145/78     Pulse Rate 08/12/19 1112 (!) 106     Resp 08/12/19 1112 18     Temp 08/12/19 1112 97.9 F (36.6 C)     Temp Source 08/12/19 1112 Temporal     SpO2 08/12/19 1112 97 %     Weight --      Height --      Head Circumference --      Peak Flow --      Pain Score 08/12/19 1114 5     Pain Loc --      Pain Edu? --      Excl. in [place]? --    No data found.  Updated Vital Signs BP (!) 145/78 (BP Location: Left Arm)   Pulse (!) 106   Temp 97.9 F (36.6 C) (Temporal)   Resp 18   SpO2 97%   Visual  Acuity Right Eye Distance:   Left Eye Distance:   Bilateral Distance:    Right Eye Near:   Left Eye Near:    Bilateral Near:     Physical Exam Vitals and nursing note reviewed.  Constitutional:      Appearance: He is well-developed.     Comments: Appears uncomfortable, leaning slightly forward  HENT:     Head: Normocephalic and atraumatic.     Mouth/Throat:     Comments: Breathing comfortably at rest, CTABL, no wheezing, rales or other adventitious sounds auscultated Eyes:     Conjunctiva/sclera: Conjunctivae normal.  Cardiovascular:     Rate and Rhythm: Normal rate and regular rhythm.     Heart sounds: No murmur.  Pulmonary:     Effort: Pulmonary effort is normal. No respiratory distress.     Breath sounds: Normal breath sounds.     Comments: Breathing comfortably at rest, CTABL, no wheezing, rales or other adventitious sounds  auscultated Abdominal:     General: There is distension.     Tenderness: There is abdominal tenderness.     Comments: Abdomen distended and taut, no discoloration, nontender to palpation to upper abdomen, tenderness to palpation of lower Abdomen and into suprapubic area  Musculoskeletal:     Cervical back: Neck supple.  Skin:    General: Skin is warm and dry.  Neurological:     Mental Status: He is alert.      UC Treatments / Results  Labs (all labs ordered are listed, but only abnormal results are displayed) Labs Reviewed  POCT URINALYSIS DIP (MANUAL ENTRY) - Abnormal; Notable for the following components:      Result Value   Ketones, POC UA small (15) (*)    Blood, UA trace-intact (*)    All other components within normal limits    EKG   Radiology DG Abd 2 Views  Result Date: 08/12/2019 CLINICAL DATA:  Abdominal pain and bloating for 4 days. EXAM: ABDOMEN - 2 VIEW COMPARISON:  None. FINDINGS: There is a large mass projecting out of the pelvis and up into the abdomen. I suspect this is a massively distended bladder. Displacement of the bowel is noted but no findings for small bowel obstruction or free air. The soft tissue shadows of the abdomen are grossly maintained. No worrisome calcifications. The lung bases are clear. The bony structures are intact. IMPRESSION: Large mass projecting out of the pelvis and up into the abdomen. I suspect this is a massively distended bladder. Electronically Signed   By: Marijo Sanes M.D.   On: 08/12/2019 12:09    Procedures Procedures (including critical care time)  Medications Ordered in UC Medications - No data to display  Initial Impression / Assessment and Plan / UC Course  I have reviewed the triage vital signs and the nursing notes.  Pertinent labs & imaging results that were available during my care of the patient were reviewed by me and considered in my medical decision making (see chart for details).     X-ray suggestive of  massively distended bladder extending into the abdomen, trace hemoglobin on UA.  Likely urinary retention and distention causing pain, but unclear cause of urinary obstruction-possible stone versus mass versus BPH, recommended CT scanning today in emergency room for further evaluation to figure out cause as well as further relief of bladder distention.  Patient discharged independently with private car, patient has a driver.  Discussed strict return precautions. Patient verbalized understanding and is agreeable with plan.  Final  Clinical Impressions(s) / UC Diagnoses   Final diagnoses:  Pelvic pain     Discharge Instructions     Lower abdominal/pelvic pain, abdominal distention and xray suggestive of massively distended bladder    ED Prescriptions    None     PDMP not reviewed this encounter.   Joneen Caraway, Ilion C, PA-C 08/12/19 1225    Dangela How, [place] C, [place] 08/12/19 1257

## 2019-08-12 NOTE — ED Notes (Signed)
Patient able to ambulate independently  

## 2019-08-12 NOTE — ED Notes (Signed)
MD made aware of patient output. Instructed to clamp foley at this time. Patient placed on cardiac monitoring.

## 2019-08-12 NOTE — Discharge Instructions (Signed)
Your sodium level was slightly lower today. Have your primary care provider recheck your sodium level. Call [facility] for appointment.

## 2019-08-12 NOTE — Telephone Encounter (Signed)
Patient calls with moderate to severe abdominal pain that gradual came on Saturday and has began to worsen over the last 24 hours. It is centered lower abdominal pain above the pubic area.Does not radiate to sides/back. No chest or back pain.. Comes and goes and is worse with standing/walking. Describes as dull core ache that hits about every 20 minutes and last approximately 15 seconds. Taking pepto/mylanta stools are now watery, "squirts of stool" relieve the pain for minutes. Dribbles of urine at times since pain started. No blood in urine or stool noticed. Denies headache and body aches.No fever/vomiting No appetite and decreased fluid intake to less than 3 glasses yesterday.  Advised UC or ED at this time. Will have someone drive him at this time. Patient will call [facility] and set up transfer of care appointment after today.  Reason for Disposition . [1] MODERATE pain (e.g., interferes with normal activities) AND [2] pain comes and goes (cramps) AND [3] present > 24 hours  (Exception: pain with Vomiting or Diarrhea - see that Guideline)  Answer Assessment - Initial Assessment Questions 1. LOCATION: "Where does it hurt?"     Lower abdominal pain 2. RADIATION: "Does the pain shoot anywhere else?" (e.g., chest, back)      3. ONSET: "When did the pain begin?" (Minutes, hours or days ago)      3 days ago 4. SUDDEN: "Gradual or sudden onset?"     Gradual on saturday 5. PATTERN "Does the pain come and go, or is it constant?"    - If constant: "Is it getting better, staying the same, or worsening?"      (Note: Constant means the pain never goes away completely; most serious pain is constant and it progresses)     - If intermittent: "How long does it last?" "Do you have pain now?"     (Note: Intermittent means the pain goes away completely between bouts)     Comes and goes depending on activity 6. SEVERITY: "How bad is the pain?"  (e.g., Scale 1-10; mild, moderate, or severe)    - MILD (1-3):  doesn't interfere with normal activities, abdomen soft and not tender to touch     - MODERATE (4-7): interferes with normal activities or awakens from sleep, tender to touch     - SEVERE (8-10): excruciating pain, doubled over, unable to do any normal activities       9-10 7. RECURRENT SYMPTOM: "Have you ever had this type of abdominal pain before?" If so, ask: "When was the last time?" and "What happened that time?"      no 8. CAUSE: "What do you think is causing the abdominal pain?"     unknown 9. RELIEVING/AGGRAVATING FACTORS: "What makes it better or worse?" (e.g., movement, antacids, bowel movement)     Liquid stool helps for just minutes 10. OTHER SYMPTOMS: "Has there been any vomiting, diarrhea, constipation, or urine problems?"       none  Protocols used: ABDOMINAL PAIN - MALE-A-AH

## 2019-12-28 ENCOUNTER — Other Ambulatory Visit: Payer: Self-pay | Admitting: Urology

## 2020-01-01 ENCOUNTER — Encounter (HOSPITAL_BASED_OUTPATIENT_CLINIC_OR_DEPARTMENT_OTHER): Payer: Self-pay | Admitting: Urology

## 2020-01-01 ENCOUNTER — Other Ambulatory Visit: Payer: Self-pay

## 2020-01-01 NOTE — Progress Notes (Signed)
Spoke w/ via phone for pre-op interview---Pt Lab needs dos---- none              Lab results------none COVID test ------01-08-2020 at 1100 am Arrive at -------900 am 01-12-2020 NPO after MN NO Solid Food.  Clear liquids from MN until---800 amthen npo Medications to take morning of surgery -----none Diabetic medication -----n/a Patient Special Instructions -----none Pre-Op special Istructions -----none Patient verbalized understanding of instructions that were given at this phone interview. Patient denies shortness of breath, chest pain, fever, cough a this phone interview.

## 2020-01-08 ENCOUNTER — Other Ambulatory Visit (HOSPITAL_COMMUNITY)
Admission: RE | Admit: 2020-01-08 | Discharge: 2020-01-08 | Disposition: A | Discharge status: Home / Self Care | Payer: 59 | Source: Ambulatory Visit | Attending: Urology | Admitting: Urology

## 2020-01-08 DIAGNOSIS — Z01812 Encounter for preprocedural laboratory examination: Secondary | ICD-10-CM | POA: Diagnosis present

## 2020-01-08 DIAGNOSIS — Z20822 Contact with and (suspected) exposure to covid-19: Secondary | ICD-10-CM | POA: Diagnosis not present

## 2020-01-08 LAB — SARS CORONAVIRUS 2 (TAT 6-24 HRS): SARS Coronavirus 2: NEGATIVE

## 2020-01-12 ENCOUNTER — Other Ambulatory Visit: Payer: Self-pay | Admitting: Urology

## 2020-01-12 ENCOUNTER — Ambulatory Visit (HOSPITAL_BASED_OUTPATIENT_CLINIC_OR_DEPARTMENT_OTHER): Payer: 59 | Admitting: Anesthesiology

## 2020-01-12 ENCOUNTER — Other Ambulatory Visit: Payer: Self-pay

## 2020-01-12 ENCOUNTER — Encounter (HOSPITAL_BASED_OUTPATIENT_CLINIC_OR_DEPARTMENT_OTHER): Payer: Self-pay | Admitting: Urology

## 2020-01-12 ENCOUNTER — Encounter (HOSPITAL_BASED_OUTPATIENT_CLINIC_OR_DEPARTMENT_OTHER)
Admission: RE | Disposition: A | Discharge status: Home / Self Care | Payer: Self-pay | Source: Home / Self Care | Attending: Urology

## 2020-01-12 ENCOUNTER — Ambulatory Visit (HOSPITAL_COMMUNITY)
Admission: RE | Admit: 2020-01-12 | Discharge: 2020-01-12 | Disposition: A | Discharge status: Home / Self Care | Payer: 59 | Attending: Urology | Admitting: Urology

## 2020-01-12 DIAGNOSIS — R338 Other retention of urine: Secondary | ICD-10-CM | POA: Insufficient documentation

## 2020-01-12 DIAGNOSIS — A630 Anogenital (venereal) warts: Secondary | ICD-10-CM | POA: Insufficient documentation

## 2020-01-12 DIAGNOSIS — D074 Carcinoma in situ of penis: Secondary | ICD-10-CM | POA: Insufficient documentation

## 2020-01-12 DIAGNOSIS — N401 Enlarged prostate with lower urinary tract symptoms: Secondary | ICD-10-CM | POA: Insufficient documentation

## 2020-01-12 DIAGNOSIS — F17200 Nicotine dependence, unspecified, uncomplicated: Secondary | ICD-10-CM | POA: Diagnosis not present

## 2020-01-12 DIAGNOSIS — Z880 Allergy status to penicillin: Secondary | ICD-10-CM | POA: Diagnosis not present

## 2020-01-12 HISTORY — PX: PENILE BIOPSY: SHX6013

## 2020-01-12 HISTORY — DX: Disorder of penis, unspecified: N48.9

## 2020-01-12 SURGERY — BIOPSY, PENIS
Anesthesia: General | Site: Penis

## 2020-01-12 MED ORDER — CLINDAMYCIN PHOSPHATE 600 MG/50ML IV SOLN
INTRAVENOUS | Status: AC
  Filled 2020-01-12: qty 50

## 2020-01-12 MED ORDER — CLINDAMYCIN PHOSPHATE 600 MG/50ML IV SOLN: 600.0000 mg | Freq: Once | INTRAVENOUS | Status: DC

## 2020-01-12 MED ORDER — CEFAZOLIN SODIUM-DEXTROSE 2-4 GM/100ML-% IV SOLN
INTRAVENOUS | Status: AC
  Filled 2020-01-12: qty 100

## 2020-01-12 MED ORDER — DEXAMETHASONE SODIUM PHOSPHATE 10 MG/ML IJ SOLN
INTRAMUSCULAR | Status: AC
  Filled 2020-01-12: qty 1

## 2020-01-12 MED ORDER — OXYCODONE HCL 5 MG/5ML PO SOLN: 5.0000 mg | Freq: Once | ORAL | Status: DC | PRN

## 2020-01-12 MED ORDER — LIDOCAINE 2% (20 MG/ML) 5 ML SYRINGE
INTRAMUSCULAR | Status: AC
  Filled 2020-01-12: qty 5

## 2020-01-12 MED ORDER — FENTANYL CITRATE (PF) 100 MCG/2ML IJ SOLN
INTRAMUSCULAR | Status: DC | PRN
  Administered 2020-01-12: 50 ug via INTRAVENOUS
  Administered 2020-01-12 (×2): 25 ug via INTRAVENOUS

## 2020-01-12 MED ORDER — MIDAZOLAM HCL 2 MG/2ML IJ SOLN
INTRAMUSCULAR | Status: AC
  Filled 2020-01-12: qty 2

## 2020-01-12 MED ORDER — BUPIVACAINE HCL (PF) 0.25 % IJ SOLN
INTRAMUSCULAR | Status: DC | PRN
  Administered 2020-01-12: 5 mL

## 2020-01-12 MED ORDER — CEFAZOLIN SODIUM-DEXTROSE 2-4 GM/100ML-% IV SOLN: 2.0000 g | INTRAVENOUS | Status: DC

## 2020-01-12 MED ORDER — PROMETHAZINE HCL 25 MG/ML IJ SOLN: 6.2500 mg | INTRAMUSCULAR | Status: DC | PRN

## 2020-01-12 MED ORDER — LIDOCAINE HCL 1 % IJ SOLN
INTRAMUSCULAR | Status: DC | PRN
  Administered 2020-01-12: 5 mL

## 2020-01-12 MED ORDER — PROPOFOL 10 MG/ML IV BOLUS
INTRAVENOUS | Status: AC
  Filled 2020-01-12: qty 20

## 2020-01-12 MED ORDER — CLINDAMYCIN PHOSPHATE 600 MG/50ML IV SOLN
INTRAVENOUS | Status: DC | PRN
Start: 2020-01-12 — End: 2020-01-12
  Administered 2020-01-12: 600 mg via INTRAVENOUS

## 2020-01-12 MED ORDER — MIDAZOLAM HCL 5 MG/5ML IJ SOLN
INTRAMUSCULAR | Status: DC | PRN
  Administered 2020-01-12: 2 mg via INTRAVENOUS

## 2020-01-12 MED ORDER — OXYCODONE HCL 5 MG PO TABS: 5.0000 mg | ORAL_TABLET | Freq: Once | ORAL | Status: DC | PRN

## 2020-01-12 MED ORDER — HYDROCODONE-ACETAMINOPHEN 5-325 MG PO TABS: 1.0000 | ORAL_TABLET | Freq: Four times a day (QID) | ORAL | 0 refills | Status: AC | PRN

## 2020-01-12 MED ORDER — FENTANYL CITRATE (PF) 100 MCG/2ML IJ SOLN
INTRAMUSCULAR | Status: AC
  Filled 2020-01-12: qty 2

## 2020-01-12 MED ORDER — LACTATED RINGERS IV SOLN: INTRAVENOUS | Status: DC

## 2020-01-12 MED ORDER — ONDANSETRON HCL 4 MG/2ML IJ SOLN
INTRAMUSCULAR | Status: DC | PRN
  Administered 2020-01-12: 4 mg via INTRAVENOUS

## 2020-01-12 MED ORDER — ACETAMINOPHEN 500 MG PO TABS
1000.0000 mg | ORAL_TABLET | Freq: Once | ORAL | Status: AC
  Administered 2020-01-12: 1000 mg via ORAL

## 2020-01-12 MED ORDER — ACETAMINOPHEN 500 MG PO TABS
ORAL_TABLET | ORAL | Status: AC
  Filled 2020-01-12: qty 2

## 2020-01-12 MED ORDER — ONDANSETRON HCL 4 MG/2ML IJ SOLN
INTRAMUSCULAR | Status: AC
  Filled 2020-01-12: qty 2

## 2020-01-12 MED ORDER — LIDOCAINE HCL (CARDIAC) PF 100 MG/5ML IV SOSY
PREFILLED_SYRINGE | INTRAVENOUS | Status: DC | PRN
  Administered 2020-01-12: 80 mg via INTRAVENOUS

## 2020-01-12 MED ORDER — DEXAMETHASONE SODIUM PHOSPHATE 4 MG/ML IJ SOLN
INTRAMUSCULAR | Status: DC | PRN
  Administered 2020-01-12: 5 mg via INTRAVENOUS

## 2020-01-12 MED ORDER — PROPOFOL 10 MG/ML IV BOLUS
INTRAVENOUS | Status: DC | PRN
  Administered 2020-01-12: 30 mg via INTRAVENOUS
  Administered 2020-01-12: 150 mg via INTRAVENOUS

## 2020-01-12 MED ORDER — FENTANYL CITRATE (PF) 100 MCG/2ML IJ SOLN: 25.0000 ug | INTRAMUSCULAR | Status: DC | PRN

## 2020-01-12 SURGICAL SUPPLY — 28 items
BLADE SURG 15 STRL LF DISP TIS (BLADE) ×1 IMPLANT
BLADE SURG 15 STRL SS (BLADE) ×3
BNDG COHESIVE 1X5 TAN NS LF (GAUZE/BANDAGES/DRESSINGS) ×3 IMPLANT
BNDG COHESIVE 1X5 TAN STRL LF (GAUZE/BANDAGES/DRESSINGS) ×3 IMPLANT
BNDG GAUZE ELAST 4 BULKY (GAUZE/BANDAGES/DRESSINGS) ×3 IMPLANT
CATH COUDE FOLEY 2W 5CC 18FR (CATHETERS) ×3 IMPLANT
COVER BACK TABLE 60X90IN (DRAPES) ×3 IMPLANT
COVER MAYO STAND STRL (DRAPES) ×3 IMPLANT
COVER WAND RF STERILE (DRAPES) ×3 IMPLANT
DRAPE LAPAROTOMY T 98X78 PEDS (DRAPES) ×3 IMPLANT
ELECT REM PT RETURN 9FT ADLT (ELECTROSURGICAL) ×3
ELECTRODE REM PT RTRN 9FT ADLT (ELECTROSURGICAL) ×1 IMPLANT
GAUZE SPONGE 4X4 12PLY STRL (GAUZE/BANDAGES/DRESSINGS) ×3 IMPLANT
GAUZE XEROFORM 1X8 LF (GAUZE/BANDAGES/DRESSINGS) ×3 IMPLANT
GLOVE BIO SURGEON STRL SZ 6.5 (GLOVE) ×2 IMPLANT
GLOVE BIO SURGEONS STRL SZ 6.5 (GLOVE) ×1
GOWN STRL REUS W/TWL LRG LVL3 (GOWN DISPOSABLE) ×3 IMPLANT
KIT TURNOVER CYSTO (KITS) ×3 IMPLANT
NEEDLE HYPO 22GX1.5 SAFETY (NEEDLE) ×3 IMPLANT
NS IRRIG 1000ML POUR BTL (IV SOLUTION) IMPLANT
PACK BASIN DAY SURGERY FS (CUSTOM PROCEDURE TRAY) ×3 IMPLANT
PENCIL SMOKE EVACUATOR (MISCELLANEOUS) IMPLANT
SUT CHROMIC 3 0 SH 27 (SUTURE) ×6 IMPLANT
SUT CHROMIC 4 0 SH 27 (SUTURE) ×3 IMPLANT
SYR 30ML LL (SYRINGE) ×3 IMPLANT
SYR BULB EAR ULCER 3OZ GRN STR (SYRINGE) ×3 IMPLANT
SYR CONTROL 10ML LL (SYRINGE) ×6 IMPLANT
TOWEL OR 17X26 10 PK STRL BLUE (TOWEL DISPOSABLE) ×3 IMPLANT

## 2020-01-12 NOTE — Op Note (Addendum)
Operative Note  Preoperative diagnosis:  1.  Penile CIS with positive margins  Postoperative diagnosis: 1.  Same  Procedure(s): 1.  Re-excision of penile lesion for positive margins  Surgeon: Jacalyn Lefevre, MD  Assistants:  None  Anesthesia:  General  Complications:  None  EBL:  5 mL  Specimens: 1. Left penile lesion 2 cm  Drains/Catheters: 1.  18Fr coude foley changed intraoperatively  Intraoperative findings:   1. Small raised area at prior excision site  Indication:  David Townsend is a 59 y.o. male with history of penile lesion that was excised and pathology showed CIS with positive margins.  Description of procedure:  After the risks, benefits and alternatives of the procedure were discussed with the patient.  Informed consent was obtained.  The patient was taken back to the OR and anesthesia was induced and antibiotics were administered.  He was prepped and draped in the usual sterile fashion.  A time out was performed.  The site of the prior scar was identified and outlined in an ellipse.  Local anesthesia was then injected.  The outlined area was then sharply excised.  Hemostasis was achieved.  The defect in the penile skin was then closed in 2 layers using interuppted 4-0 chromic.   The pre-existing foley catheter was removed prior to the start of the case.  A new 18Fr coude catheter was then placed after the excision of penile lesion using sterile technique.  A xeroform gauze with koban dressing was then applied to penile wound.  The patient emerged from anesthesia in stable condition and was transferred to the PACU in stable condition.      Plan:  Follow up for pathology in 2 weeks

## 2020-01-12 NOTE — Anesthesia Postprocedure Evaluation (Signed)
Anesthesia Post Note  Patient: [name]  Procedure(s) Performed: EXCISION OF PENILE LESION (N/A Penis)     Patient location during evaluation: PACU Anesthesia Type: General Level of consciousness: awake and alert and oriented Pain management: pain level controlled Vital Signs Assessment: post-procedure vital signs reviewed and stable Respiratory status: spontaneous breathing, nonlabored ventilation and respiratory function stable Cardiovascular status: blood pressure returned to baseline Postop Assessment: no apparent nausea or vomiting Anesthetic complications: no   No complications documented.  Last Vitals:  Vitals:   01/12/20 1155 01/12/20 1218  BP: (!) 141/86 (!) 135/94  Pulse: 80 85  Resp: 10 14  Temp:  36.7 C  SpO2: 97% 98%    Last Pain:  Vitals:   01/12/20 1218  TempSrc:   PainSc: 0-No pain                 David Townsend

## 2020-01-12 NOTE — Anesthesia Preprocedure Evaluation (Addendum)
Anesthesia Evaluation  Patient identified by MRN, date of birth, ID band Patient awake    Reviewed: Allergy & Precautions, NPO status , Patient's Chart, lab work & pertinent test results  History of Anesthesia Complications Negative for: history of anesthetic complications  Airway Mallampati: II  TM Distance: >3 FB Neck ROM: Full    Dental  (+) Chipped,    Pulmonary Current Smoker and Patient abstained from smoking.,    Pulmonary exam normal        Cardiovascular negative cardio ROS Normal cardiovascular exam     Neuro/Psych negative neurological ROS  negative psych ROS   GI/Hepatic negative GI ROS, Neg liver ROS,   Endo/Other  negative endocrine ROS  Renal/GU negative Renal ROS   Penile lesion    Musculoskeletal negative musculoskeletal ROS (+)   Abdominal   Peds  Hematology negative hematology ROS (+)   Anesthesia Other Findings Day of surgery medications reviewed with patient.  Reproductive/Obstetrics negative OB ROS                            Anesthesia Physical Anesthesia Plan  ASA: II  Anesthesia Plan: General   Post-op Pain Management:    Induction: Intravenous  PONV Risk Score and Plan: 2 and Treatment may vary due to age or medical condition, Ondansetron, Dexamethasone and Midazolam  Airway Management Planned: LMA  Additional Equipment: None  Intra-op Plan:   Post-operative Plan: Extubation in OR  Informed Consent: I have reviewed the patients History and Physical, chart, labs and discussed the procedure including the risks, benefits and alternatives for the proposed anesthesia with the patient or authorized representative who has indicated his/her understanding and acceptance.     Dental advisory given  Plan Discussed with: CRNA  Anesthesia Plan Comments:        Anesthesia Quick Evaluation

## 2020-01-12 NOTE — H&P (Signed)
cc: urinary retention and CIS penile foreskin   12/11/19: 59 year old man with urinary retention since March 2021 as well as CIS of the penile foreskin comes in today for cystoscopy. We have been talking about learning CIC and patient states he is willing to try today. His urodynamics did show that he felt an urge to urinate but was not able to generate any detrusor function. Patient states he does get an urge to urinate with a catheter placed as well.   11/16/19: 59 year old man with urinary retention since March 2021 returns for follow-up after urodynamic study was done. Held a maximum bladder capacity about 960 mL. His 1st sensation was at 587 mL. He was not able to generate any detrusor activity. He returns today for discussion of bladder management. 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 We have discussed re-excise in this in the operating room.   NURSE IMPRESSION  Mr. See held a max capacity of approx. 960 mls. His 1st sensation was felt at 587 mls. No instability was noted. He tried for a long time to void, as filling was continued. He attempted to void while seated as well as standing up but was never able to generate any detrusor activity. He became uncomfortably full, so the study was stopped. No reflux was noted. A 16 fr foley was inserted post UDS. He will return later this month for OV with Dr. Claudia Desanctis.   10/19/19: 59 year old man with urinary retention and penile lesion that was excised 2 weeks of returns for follow-up. He has a urodynamics scheduled on 11/05/2019. Penile lesion pathology showed SCC in-situ with a positive lateral margin. Patient has been doing well since the procedure and everything skin healing fine. Foley remains in place has been draining well.   10/05/19: 59 year old man with urinary retention as well as penile lesion. Patient has now failed 3 void trials including today. He is also here today for excision of penile lesion.    09/03/19: 59 year old man who presented to the ER with urinary retention on 08/12/2019 and has failed 1 void trial 2 weeks ago returns for 2nd void trial. 500 cc of sterile saline a placed in the bladder however he was unable to void. He denies any urge to void with the catheter in place or bladder spasms.   08/18/19: 59 year old man presents the ER with inability to urinate on 08/12/2019. Foley catheter was placed and over 3 L of urine was drained. No evidence of AKI on lab work. Cross sectional imaging has not been done before. Patient does not have a primary care physician and has had no prostate cancer screening. He denies history of gross hematuria, UTIs, family history of prostate cancer or kidney stones. Prior to going to the emergency room he urinated few times a day and 2-3 times a night. He said his stream was medium and strength. Patient also has a lesion on his penis that is been there for years.     ALLERGIES: penicillin     MEDICATIONS: Flomax 0.4 mg capsule 1 capsule PO BID     GU PSH: Complex cystometrogram, w/ void pressure and urethral pressure profile studies, any technique - 11/05/2019 Complex Uroflow - 11/05/2019 Emg surf Electrd - 11/05/2019 Exc H-f-nk-sp B9+marg 2.1-3 - 10/05/2019, 10/05/2019 Inject For cystogram - 11/05/2019 Intrabd voidng Press - 11/05/2019 Simple Excision of Penis Lesion - 10/05/2019     NON-GU PSH: None   GU PMH: Disorder of Penis Ot, Patient has an exophytic  lesion on left penile shaft that is possibly a wart however looks to be scabbed. Discussed with patient scheduling him for excision of this. - 08/18/2019 Encounter for Prostate Cancer screening, DRE today without any abnormalities. I would like to get a PSA test in the future however given recent urinary retention may be falsely elevated so will defer this a few weeks. - 08/18/2019 Urinary Retention, Patient had over 3 L in his bladder when he went to the emergency room and catheters only been in for 5 days. I  discussed with him leaving and a longer to give his bladder time to recover. He will come back in a little over week for void trial. He will continue to take 0.8 mg of Flomax a day. We discussed that he is at risk for further episodes of urinary retention. I discussed with him that pending the results of his void trial we may need to do further investigating to see how his bladder is functioning and look at prostate size. - 08/18/2019    NON-GU PMH: Carcinoma in situ of skin, unspecified, Discussed pathology report with patient and positive lateral margin. I recommended re-excision of the skin so that we obtain negative margins. I will hold off on scheduling this and till urodynamics sitting completed in the event that he needs a procedure on his prostate so we can coordinate the same time. - 10/19/2019    FAMILY HISTORY: None   SOCIAL HISTORY: Marital Status: Single Preferred Language: English; Ethnicity: Not Hispanic Or Latino; Race: White Current Smoking Status: Patient smokes.   Tobacco Use Assessment Completed: Used Tobacco in last 30 days? Social Drinker.  Does not drink caffeine.    REVIEW OF SYSTEMS:    GU Review Male:   Patient denies frequent urination, hard to postpone urination, burning/ pain with urination, get up at night to urinate, leakage of urine, stream starts and stops, trouble starting your stream, have to strain to urinate , erection problems, and penile pain.  Gastrointestinal (Upper):   Patient denies nausea, vomiting, and indigestion/ heartburn.  Gastrointestinal (Lower):   Patient denies diarrhea and constipation.  Constitutional:   Patient denies fever, night sweats, weight loss, and fatigue.  Skin:   Patient denies skin rash/ lesion and itching.  Eyes:   Patient denies blurred vision and double vision.  Ears/ Nose/ Throat:   Patient denies sore throat and sinus problems.  Hematologic/Lymphatic:   Patient denies swollen glands and easy bruising.  Cardiovascular:    Patient denies chest pains and leg swelling.  Respiratory:   Patient denies cough and shortness of breath.  Endocrine:   Patient denies excessive thirst.  Musculoskeletal:   Patient denies back pain and joint pain.  Neurological:   Patient denies headaches and dizziness.  Psychologic:   Patient denies depression and anxiety.   VITAL SIGNS: None   GU PHYSICAL EXAMINATION:    Urethral Meatus: Normal size. No lesion, no wart, no discharge, no polyp. Normal location.  Penis: Circumcised, no warts, no cracks. No dorsal Peyronie's plaques, no left corporal Peyronie's plaques, no right corporal Peyronie's plaques, no scarring, no warts. No balanitis, no meatal stenosis.   MULTI-SYSTEM PHYSICAL EXAMINATION:    Constitutional: Well-nourished. No physical deformities. Normally developed. Good grooming.  Neck: Neck symmetrical, not swollen. Normal tracheal position.  Respiratory: No labored breathing, no use of accessory muscles.   Cardiovascular: Normal temperature  Skin: No paleness, no jaundice, no cyanosis. No lesion, no ulcer, no rash.  Neurologic / Psychiatric: Oriented to time,  oriented to place, oriented to person. No depression, no anxiety, no agitation.  Gastrointestinal: No rigidity, non obese abdomen.   Eyes: Normal conjunctivae. Normal eyelids.  Ears, Nose, Mouth, and Throat: Left ear no scars, no lesions, no masses. Right ear no scars, no lesions, no masses. Nose no scars, no lesions, no masses. Normal hearing. Normal lips.  Musculoskeletal: Normal gait and station of head and neck.     Complexity of Data:  Records Review:   POC Tool  Urine Test Review:   Urinalysis  Notes:                     08/12/2019: BUN 10, creatinine 0.78   PROCEDURES:         Flexible Cystoscopy - 52000  Risks, benefits, and some of the potential complications of the procedure were discussed at length with the patient including infection, bleeding, voiding discomfort, urinary retention, fever, chills,  sepsis, and others. All questions were answered. Informed consent was obtained. Sterile technique and intraurethral analgesia were used.  Meatus:  Normal size. Normal location. Normal condition.  Urethra:  No strictures.  External Sphincter:  Normal.  Verumontanum:  Normal.  Prostate:  Obstructing. Enlarged median lobe. No hyperplasia.  Bladder Neck:  Non-obstructing.  Ureteral Orifices:  Normal location. Normal size. Normal shape. Effluxed clear urine.  Bladder:  No trabeculation. No tumors. Normal mucosa. No stones. Patient with edema of the posterior bladder wall and some erythema consistent with having indwelling Foley catheter.      The lower urinary tract was carefully examined. The procedure was well-tolerated and without complications. Instructions were given to call the office immediately for bloody urine, difficulty urinating, urinary retention, painful or frequent urination, fever, chills, nausea, vomiting or other illness. The patient stated that he understood these instructions and would comply with them.   ASSESSMENT:      ICD-10 Details  1 GU:   Disorder of Penis Ot - H84.69 Acute, Uncomplicated - Again discussed with patient re-excising the margins from the the lesion on his penis. This will be done in the operating room under general anesthesia. The risks and benefits of the procedure were discussed with the patient including bleeding, infection, pain, poor cosmesis, need for future treatment.  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 Patient is able to the sense and urge to urinate however urodynamics did not show good detrusor function. Patient was unable to learn CIC in the office today secondary to pain. It may be that if his prostate was resected he be able to perform CIC more easily. We also discussed the possibility of proceeding with TURP in the event that he might be able to urinate on his own. I  counseled him though that based on his urodynamic study his detrusor activity was poor and there is a high possibility he will not be able to urinate spontaneously again. He understands and would like to proceed with the TURP.  3   BPH w/LUTS - N40.1 Chronic, Stable

## 2020-01-12 NOTE — Transfer of Care (Signed)
Immediate Anesthesia Transfer of Care Note  Patient: David Townsend  Procedure(s) Performed: Procedure(s) (LRB): EXCISION OF PENILE LESION (N/A)  Patient Location: PACU  Anesthesia Type: General  Level of Consciousness: awake, sedated, patient cooperative and responds to stimulation  Airway & Oxygen Therapy: Patient Spontanous Breathing and Patient connected to [place] 02 and soft FM   Post-op Assessment: Report given to PACU RN, Post -op Vital signs reviewed and stable and Patient moving all extremities  Post vital signs: Reviewed and stable  Complications: No apparent anesthesia complications

## 2020-01-12 NOTE — Discharge Instructions (Signed)
Discharge instructions following penile surgery  Call your doctor for:  Fever is greater than 100.5  Severe nausea or vomiting  Increasing pain not controlled by pain medication  Increasing redness or drainage from incisions  The number for questions or concerns is [phone]  Activity level: No lifting greater than 10 pounds (about equal to milk) for the next 2 weeks or until cleared to do so at follow-up appointment.  Otherwise activity as tolerated by comfort level.      Post Anesthesia Home Care Instructions  Activity: Get plenty of rest for the remainder of the day. A responsible individual must stay with you for 24 hours following the procedure.  For the next 24 hours, DO NOT: -Drive a car -Paediatric nurse -Drink alcoholic beverages -Take any medication unless instructed by your physician -Make any legal decisions or sign important papers.  Meals: Start with liquid foods such as gelatin or soup. Progress to regular foods as tolerated. Avoid greasy, spicy, heavy foods. If nausea and/or vomiting occur, drink only clear liquids until the nausea and/or vomiting subsides. Call your physician if vomiting continues.  Special Instructions/Symptoms: Your throat may feel dry or sore from the anesthesia or the breathing tube placed in your throat during surgery. If this causes discomfort, gargle with warm salt water. The discomfort should disappear within 24 hours.  If you had a scopolamine patch placed behind your ear for the management of post- operative nausea and/or vomiting:  1. The medication in the patch is effective for 72 hours, after which it should be removed.  Wrap patch in a tissue and discard in the trash. Wash hands thoroughly with soap and water. 2. You may remove the patch earlier than 72 hours if you experience unpleasant side effects which may include dry mouth, dizziness or visual disturbances. 3. Avoid touching the patch. Wash your hands with soap and  water after contact with the patch.   Do not take any Tylenol until after 3:00 pm today.      Diet: May resume your regular diet as tolerated  Driving: No driving while still taking opiate pain medications (weight at least 6-8 hours after last dose).  No driving if you still sore from surgery as it may limit her ability to react quickly if necessary.   Shower/bath: May shower and get incision wet pad dry immediately following.  Do not scrub vigorously for the next 2-3 weeks.  Do not soak incision (ID soaking in bath or swimming) until told he may do so by Dr., as this may promote a wound infection.  Wound care: Leave bandage on for 48 hour.  You may then remove it and shower on Thursday, 01/14/20.  Follow-up appointments: Follow-up appointment is scheduled for 8/24 at 2:30pm.

## 2020-01-12 NOTE — Interval H&P Note (Signed)
History and Physical Interval Note: Patient not ready for CIC.  Will hold off on TURP until he is ready for that.  Will proceed with reexcision of penile lesion for margins.   01/12/2020 10:00 AM  David Townsend  has presented today for surgery, with the diagnosis of PENILE LESION.  The various methods of treatment have been discussed with the patient and family. After consideration of risks, benefits and other options for treatment, the patient has consented to  Procedure(s) with comments: [place] (N/A) - 45 MINS as a surgical intervention.  The patient's history has been reviewed, patient examined, no change in status, stable for surgery.  I have reviewed the patient's chart and labs.  Questions were answered to the patient's satisfaction.     David Townsend D Salathiel Ferrara

## 2020-01-12 NOTE — Anesthesia Procedure Notes (Signed)
Procedure Name: LMA Insertion Date/Time: 01/12/2020 10:55 AM Performed by: Justice Rocher, CRNA Pre-anesthesia Checklist: Patient identified, Emergency Drugs available, Suction available, Patient being monitored and Timeout performed Patient Re-evaluated:Patient Re-evaluated prior to induction Oxygen Delivery Method: Circle system utilized Preoxygenation: Pre-oxygenation with 100% oxygen Induction Type: IV induction Ventilation: Mask ventilation without difficulty LMA: LMA inserted LMA Size: 4.0 Number of attempts: 1 Airway Equipment and Method: Bite block Placement Confirmation: positive ETCO2,  CO2 detector and breath sounds checked- equal and bilateral Tube secured with: Tape Dental Injury: Teeth and Oropharynx as per pre-operative assessment

## 2020-01-13 ENCOUNTER — Encounter (HOSPITAL_BASED_OUTPATIENT_CLINIC_OR_DEPARTMENT_OTHER): Payer: Self-pay | Admitting: Urology

## 2020-01-13 LAB — SURGICAL PATHOLOGY

## 2020-02-16 ENCOUNTER — Ambulatory Visit: Payer: Self-pay | Attending: Internal Medicine

## 2020-02-16 DIAGNOSIS — Z23 Encounter for immunization: Secondary | ICD-10-CM

## 2020-02-16 NOTE — Progress Notes (Signed)
   Covid-19 Vaccination Clinic  Name:  Christpoher Sievers    MRN: [id number] DOB: 07-26-1960  02/16/2020  Mr. Kishi was observed post Covid-19 immunization for 15 minutes without incident. He was provided with Vaccine Information Sheet and instruction to access the V-Safe system.   Mr. Orton was instructed to call 911 with any severe reactions post vaccine: Marland Kitchen Difficulty breathing  . Swelling of face and throat  . A fast heartbeat  . A bad rash all over body  . Dizziness and weakness

## 2020-05-16 IMAGING — CR DG CHEST 2V
2 series · 2 of 2 positions shown · non-contrast
Comparison: None.

CLINICAL DATA: Chest pain

EXAM:
CHEST - 2 VIEW

[w chest pa]
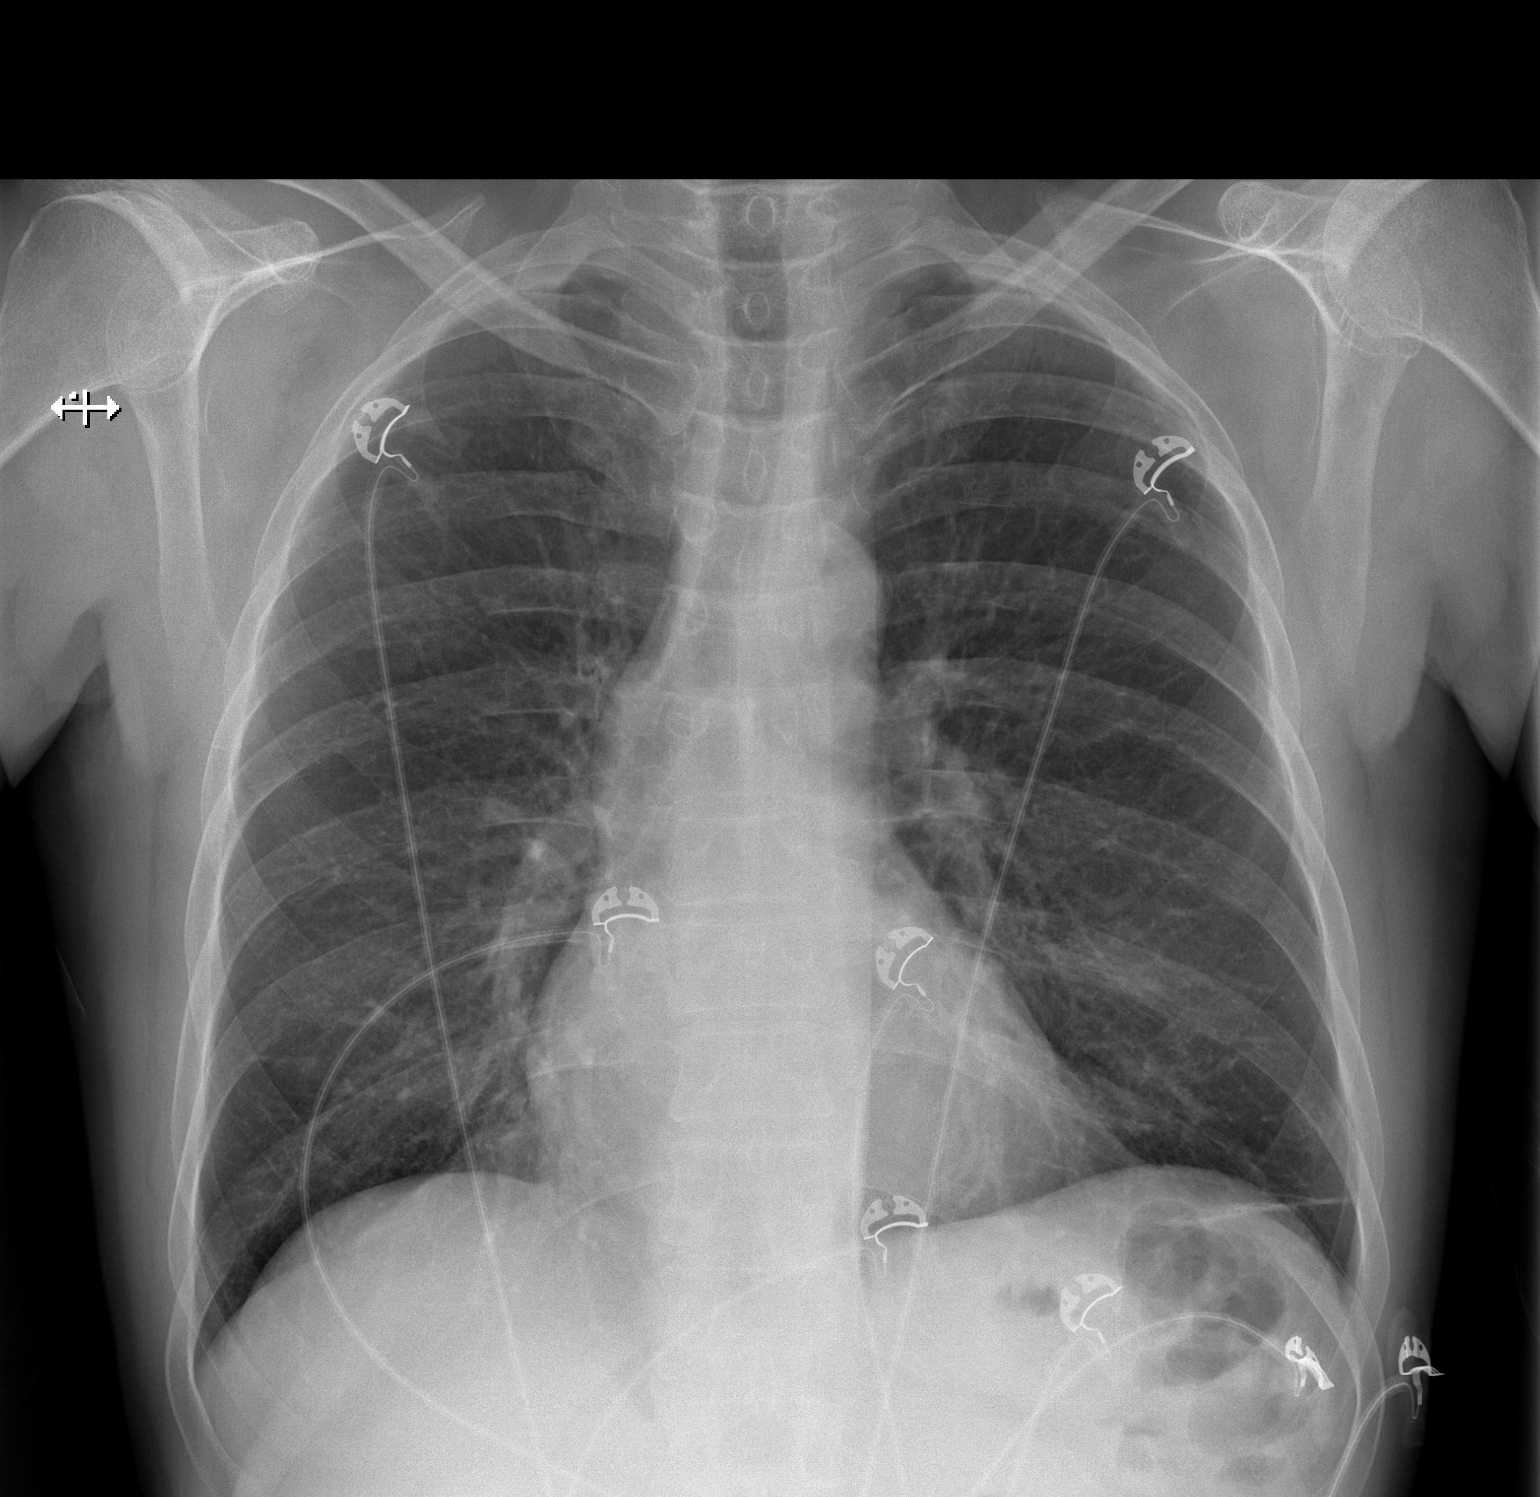

[w chest lat]
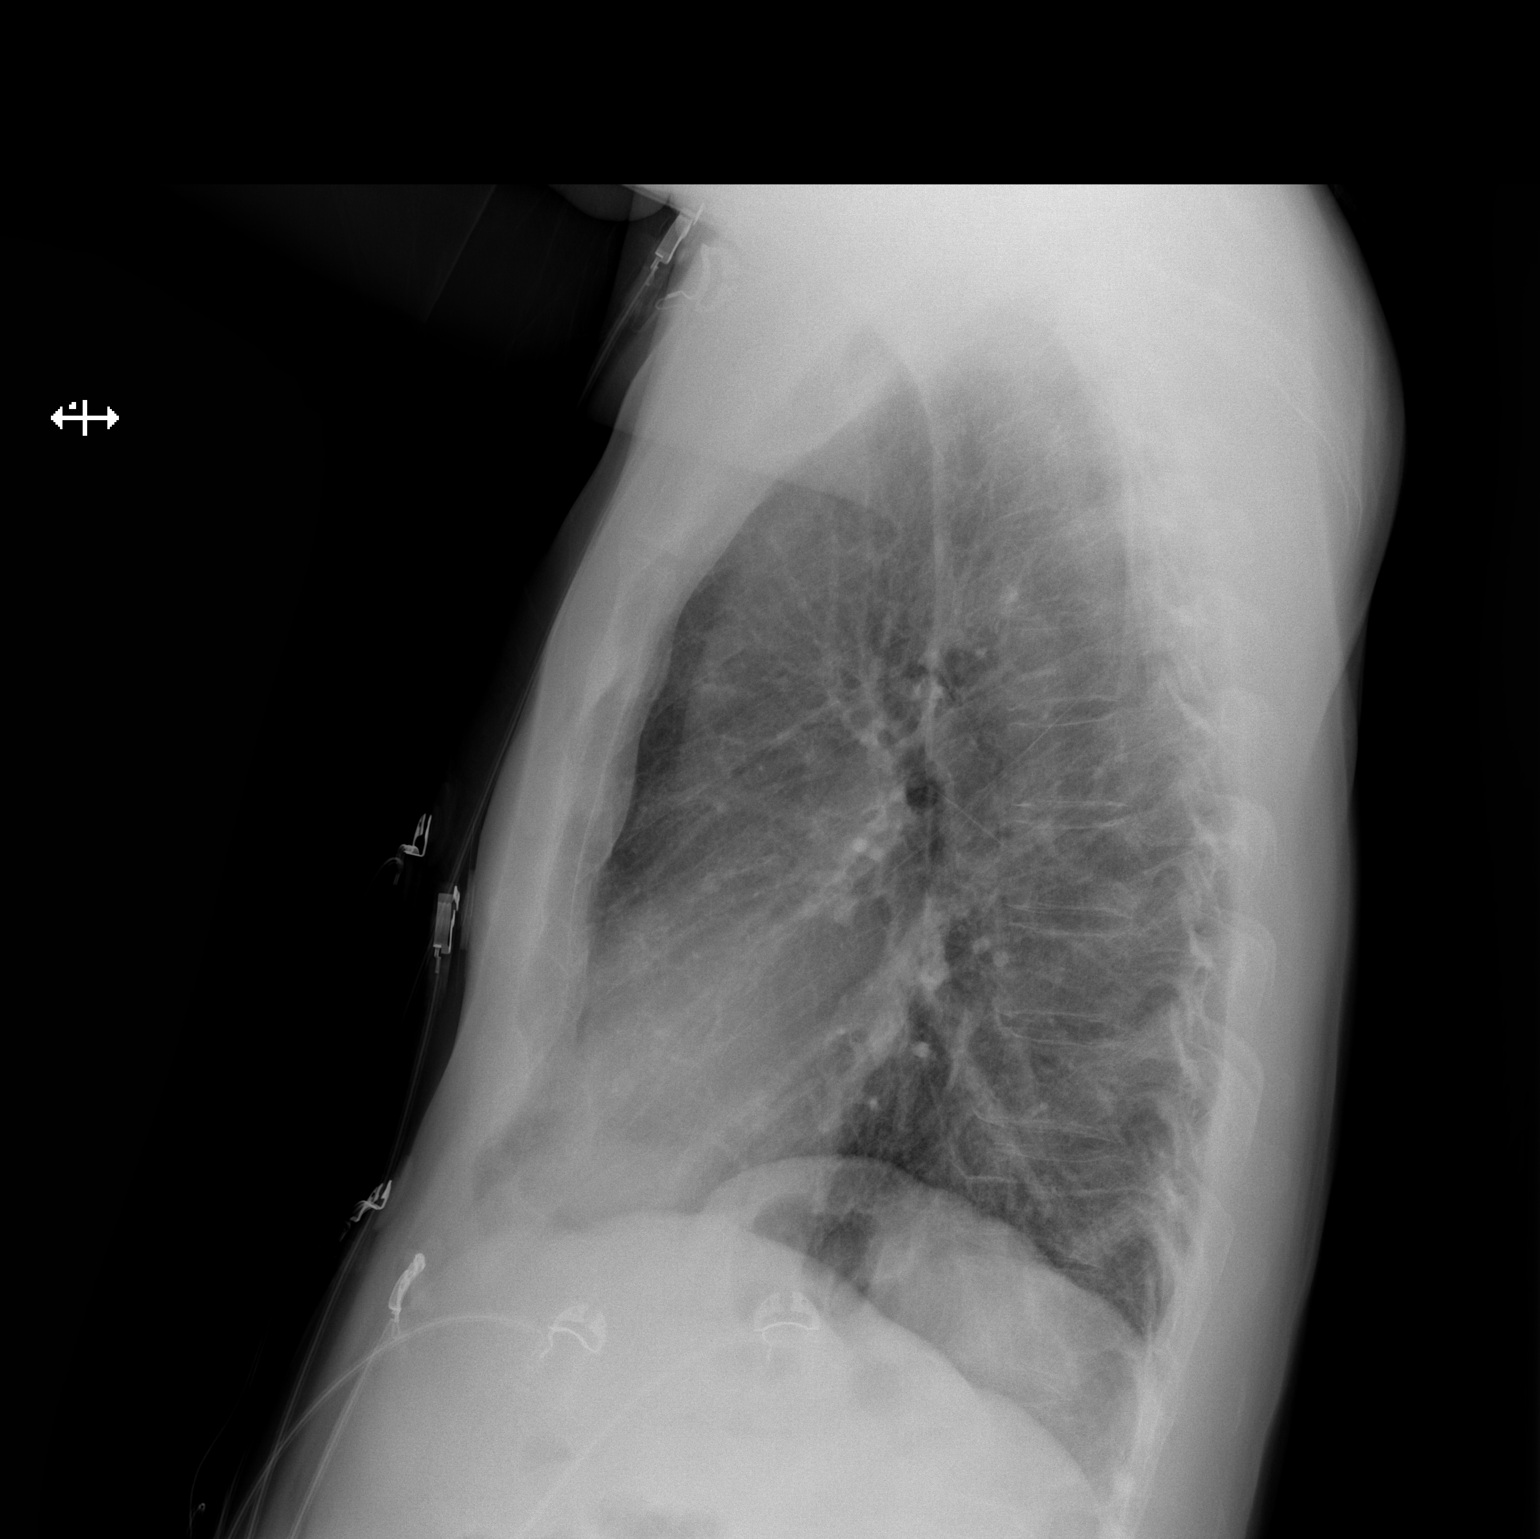

[2 of 2 positions shown; findings below may reference images not displayed]

FINDINGS: The heart size and mediastinal contours are within normal limits.
Both lungs are clear. The visualized skeletal structures are
unremarkable.
IMPRESSION: No active cardiopulmonary disease.

## 2020-12-31 ENCOUNTER — Emergency Department (HOSPITAL_COMMUNITY)
Admission: EM | Admit: 2020-12-31 | Discharge: 2020-12-31 | Disposition: A | Discharge status: Home / Self Care | Payer: 59 | Attending: Emergency Medicine | Admitting: Emergency Medicine

## 2020-12-31 ENCOUNTER — Encounter (HOSPITAL_COMMUNITY): Payer: Self-pay | Admitting: Emergency Medicine

## 2020-12-31 DIAGNOSIS — F17210 Nicotine dependence, cigarettes, uncomplicated: Secondary | ICD-10-CM | POA: Insufficient documentation

## 2020-12-31 DIAGNOSIS — Y846 Urinary catheterization as the cause of abnormal reaction of the patient, or of later complication, without mention of misadventure at the time of the procedure: Secondary | ICD-10-CM | POA: Insufficient documentation

## 2020-12-31 DIAGNOSIS — T83098A Other mechanical complication of other indwelling urethral catheter, initial encounter: Secondary | ICD-10-CM | POA: Diagnosis not present

## 2020-12-31 DIAGNOSIS — T839XXA Unspecified complication of genitourinary prosthetic device, implant and graft, initial encounter: Secondary | ICD-10-CM

## 2020-12-31 LAB — URINALYSIS, ROUTINE W REFLEX MICROSCOPIC
Bilirubin Urine: NEGATIVE
Glucose, UA: NEGATIVE mg/dL
Ketones, ur: NEGATIVE mg/dL
Nitrite: POSITIVE — AB
Protein, ur: 100 mg/dL — AB
Specific Gravity, Urine: 1.014 (ref 1.005–1.030)
WBC, UA: >50 WBC/hpf — ABNORMAL HIGH (ref 0–5)
pH: 5 (ref 5.0–8.0)

## 2020-12-31 NOTE — ED Triage Notes (Signed)
Pt states he has a chronic cath due to bladder issues and the cath came out ~1 hour ago. Pt requesting replacement. No physical complaints at this time.

## 2020-12-31 NOTE — ED Provider Notes (Signed)
[facility] DEPT Provider Note   CSN: [id number] Arrival date & time: 12/31/20  2103     History Chief Complaint  Patient presents with   Catheter Issue    David Townsend is a 60 y.o. male.  HPI     This is a 60 year old male who presents with Foley catheter dislodgment.  Patient reports that his Foley catheter came out approximately 1 hour ago.  He states he has had a catheter for over a year.  Initially he was having it changed monthly; however he states that that got old and this catheter had been in place since approximately December.  He states that it "just came out" 1 hour ago.  He had no significant pain.  Denied any urinary symptoms or fevers.  Past Medical History:  Diagnosis Date   Penile lesion     Patient Active Problem List   Diagnosis Date Noted   ETOH abuse 11/01/2015    Past Surgical History:  Procedure Laterality Date   NO PAST SURGERIES     PENILE BIOPSY N/A 01/12/2020   Procedure: EXCISION OF PENILE LESION;  Surgeon: Robley Fries, MD;  Location: [facility];  Service: Urology;  Laterality: N/A;       Family History  Family history unknown: Yes    Social History   Tobacco Use   Smoking status: Every Day    Packs/day: 2.00    Years: 34.00    Pack years: 68.00    Types: Cigarettes   Smokeless tobacco: Never  Vaping Use   Vaping Use: Never used  Substance Use Topics   Alcohol use: Yes    Alcohol/week: 0.0 standard drinks    Comment: 5-6 beers per day   Drug use: No    Home Medications Prior to Admission medications   Medication Sig Start Date End Date Taking? Authorizing Provider  HYDROcodone-acetaminophen (NORCO/VICODIN) 5-325 MG tablet Take 1 tablet by mouth every 6 (six) hours as needed for moderate pain (postop penile pain). 01/12/20 01/11/21  Robley Fries, MD    Allergies    Penicillins  Review of Systems   Review of Systems  Constitutional:  Negative for fever.   Genitourinary:  Negative for difficulty urinating, dysuria and scrotal swelling.  All other systems reviewed and are negative.  Physical Exam Updated Vital Signs BP (!) 160/100 (BP Location: Right Arm)   Pulse 90   Temp 98.5 F (36.9 C) (Oral)   Resp 16   SpO2 96%   Physical Exam Vitals and nursing note reviewed.  Constitutional:      Appearance: He is well-developed. He is not ill-appearing.  HENT:     Head: Normocephalic and atraumatic.     Nose: Nose normal.     Mouth/Throat:     Mouth: Mucous membranes are moist.  Eyes:     Pupils: Pupils are equal, round, and reactive to light.  Cardiovascular:     Rate and Rhythm: Normal rate and regular rhythm.  Pulmonary:     Effort: Pulmonary effort is normal. No respiratory distress.  Abdominal:     Palpations: Abdomen is soft.  Genitourinary:    Comments: Deferred Musculoskeletal:     Cervical back: Neck supple.  Lymphadenopathy:     Cervical: No cervical adenopathy.  Skin:    General: Skin is warm and dry.  Neurological:     Mental Status: He is alert and oriented to person, place, and time.  Psychiatric:  Mood and Affect: Mood normal.    ED Results / Procedures / Treatments   Labs (all labs ordered are listed, but only abnormal results are displayed) Labs Reviewed  URINALYSIS, ROUTINE W REFLEX MICROSCOPIC    EKG None  Radiology No results found.  Procedures Procedures   Medications Ordered in ED Medications - No data to display  ED Course  I have reviewed the triage vital signs and the nursing notes.  Pertinent labs & imaging results that were available during my care of the patient were reviewed by me and considered in my medical decision making (see chart for details).    MDM Rules/Calculators/A&P                           Patient presents with dislodgment of his Foley catheter.  He brought his Foley catheter with him.  It appears quite dirty and I suspect the Foley bulb got a hole in it.   Catheter was replaced.  Discussed with patient that he needs to follow-up with urology given his ongoing catheter use for routine care.  Patient stated understanding.  Urinalysis was sent but patient did not stay for results.  Doubt UTI and patient is not systemically ill appearing.  After history, exam, and medical workup I feel the patient has been appropriately medically screened and is safe for discharge home. Pertinent diagnoses were discussed with the patient. Patient was given return precautions.  Final Clinical Impression(s) / ED Diagnoses Final diagnoses:  Problem with Foley catheter, initial encounter [facility])    Rx / DC Orders ED Discharge Orders     None        Merryl Hacker, MD 12/31/20 2220

## 2020-12-31 NOTE — Discharge Instructions (Addendum)
You were seen today for replacement of your Foley catheter.  Follow-up with Dr. Claudia Desanctis.  If you develop fevers or any new or worsening symptoms, you should be reassessed.

## 2021-02-25 ENCOUNTER — Other Ambulatory Visit: Payer: Self-pay

## 2021-07-07 ENCOUNTER — Other Ambulatory Visit (HOSPITAL_COMMUNITY): Payer: Self-pay | Admitting: Urology

## 2021-07-07 DIAGNOSIS — C61 Malignant neoplasm of prostate: Secondary | ICD-10-CM

## 2021-07-13 ENCOUNTER — Encounter (HOSPITAL_COMMUNITY)
Admission: RE | Admit: 2021-07-13 | Discharge: 2021-07-13 | Disposition: A | Discharge status: Home / Self Care | Payer: 59 | Source: Ambulatory Visit | Attending: Urology | Admitting: Urology

## 2021-07-13 ENCOUNTER — Other Ambulatory Visit: Payer: Self-pay

## 2021-07-13 DIAGNOSIS — C61 Malignant neoplasm of prostate: Secondary | ICD-10-CM | POA: Insufficient documentation

## 2021-07-13 MED ORDER — TECHNETIUM TC 99M MEDRONATE IV KIT
20.0000 | PACK | Freq: Once | INTRAVENOUS | Status: AC | PRN
  Administered 2021-07-13: 21.2 via INTRAVENOUS

## 2021-08-09 ENCOUNTER — Encounter (HOSPITAL_COMMUNITY): Payer: Self-pay | Admitting: Radiology

## 2021-10-17 ENCOUNTER — Ambulatory Visit
Admission: EM | Admit: 2021-10-17 | Discharge: 2021-10-17 | Disposition: A | Discharge status: Home / Self Care | Payer: 59 | Attending: Physician Assistant | Admitting: Physician Assistant

## 2021-10-17 ENCOUNTER — Other Ambulatory Visit: Payer: Self-pay

## 2021-10-17 ENCOUNTER — Encounter: Payer: Self-pay | Admitting: Emergency Medicine

## 2021-10-17 DIAGNOSIS — Z760 Encounter for issue of repeat prescription: Secondary | ICD-10-CM | POA: Diagnosis not present

## 2021-10-17 DIAGNOSIS — I10 Essential (primary) hypertension: Secondary | ICD-10-CM

## 2021-10-17 DIAGNOSIS — R030 Elevated blood-pressure reading, without diagnosis of hypertension: Secondary | ICD-10-CM

## 2021-10-17 MED ORDER — OLMESARTAN MEDOXOMIL-HCTZ 40-25 MG PO TABS: 1.0000 | ORAL_TABLET | Freq: Every day | ORAL | 2 refills | Status: DC

## 2021-10-17 NOTE — ED Provider Notes (Signed)
?[facility] ? ? ? ?CSN: [id number] ?Arrival date & time: 10/17/21  0737 ? ? ?  ? ?History   ?Chief Complaint ?Chief Complaint  ?Patient presents with  ? Hypertension  ? ? ?HPI ?David Townsend is a 61 y.o. male.  ? ?Patient presents today for blood pressure recheck.  Reports that his primary care provider retired and he has been without primary care for several months.  He is followed by urology for prostate cancer and they refilled his blood pressure medication but probably to give an additional refill as he needed to establish with PCP.  He has been concerned about running out of his medication and so has been rationing it by taking it once every few days.  His last dose was earlier today.  He is not monitoring his diet for salt.  He does avoid NSAIDs and decongestants.  He golfs regularly and is otherwise active.  He monitors his blood pressure occasionally at home and reports systolic between 106-269 and diastolic between 80 and 90.  He denies any chest pain, shortness of breath, headache, vision change, dizziness, leg swelling. ? ? ?Past Medical History:  ?Diagnosis Date  ? Penile lesion   ? ? ?Patient Active Problem List  ? Diagnosis Date Noted  ? ETOH abuse 11/01/2015  ? ? ?Past Surgical History:  ?Procedure Laterality Date  ? NO PAST SURGERIES    ? PENILE BIOPSY N/A 01/12/2020  ? Procedure: EXCISION OF PENILE LESION;  Surgeon: Robley Fries, MD;  Location: [facility];  Service: Urology;  Laterality: N/A;  ? ? ? ? ? ?Home Medications   ? ?Prior to Admission medications   ?Medication Sig Start Date End Date Taking? Authorizing Provider  ?olmesartan-hydrochlorothiazide (BENICAR HCT) 40-25 MG tablet Take 1 tablet by mouth daily. 10/17/21  Yes Kayhan Boardley, Derry Skill, PA-C  ? ? ?Family History ?Family History  ?Family history unknown: Yes  ? ? ?Social History ?Social History  ? ?Tobacco Use  ? Smoking status: Every Day  ?  Packs/day: 2.00  ?  Years: 34.00  ?  Pack years: 68.00  ?  Types:  Cigarettes  ? Smokeless tobacco: Never  ?Vaping Use  ? Vaping Use: Never used  ?Substance Use Topics  ? Alcohol use: Yes  ?  Alcohol/week: 0.0 standard drinks  ?  Comment: 5-6 beers per day  ? Drug use: No  ? ? ? ?Allergies   ?Penicillins ? ? ?Review of Systems ?Review of Systems  ?Constitutional:  Negative for activity change, appetite change, fatigue and fever.  ?Eyes:  Negative for visual disturbance.  ?Respiratory:  Negative for shortness of breath.   ?Cardiovascular:  Negative for chest pain.  ?Gastrointestinal:  Negative for abdominal pain, diarrhea, nausea and vomiting.  ?Neurological:  Negative for dizziness, light-headedness and headaches.  ? ? ?Physical Exam ?Triage Vital Signs ?ED Triage Vitals [10/17/21 1020]  ?Enc Vitals Group  ?   BP (!) 149/82  ?   Pulse Rate 92  ?   Resp 18  ?   Temp 98.7 ?F (37.1 ?C)  ?   Temp Source Oral  ?   SpO2 99 %  ?   Weight   ?   Height   ?   Head Circumference   ?   Peak Flow   ?   Pain Score 0  ?   Pain Loc   ?   Pain Edu?   ?   Excl. in [place]?   ? ?No data  found. ? ?Updated Vital Signs ?BP (!) 149/82 (BP Location: Left Arm)   Pulse 92   Temp 98.7 ?F (37.1 ?C) (Oral)   Resp 18   SpO2 99%  ? ?Visual Acuity ?Right Eye Distance:   ?Left Eye Distance:   ?Bilateral Distance:   ? ?Right Eye Near:   ?Left Eye Near:    ?Bilateral Near:    ? ?Physical Exam ?Vitals reviewed.  ?Constitutional:   ?   General: He is awake.  ?   Appearance: Normal appearance. He is well-developed. He is not ill-appearing.  ?   Comments: Very pleasant male appears stated age in no acute distress sitting comfortably in exam room  ?HENT:  ?   Head: Normocephalic and atraumatic.  ?Cardiovascular:  ?   Rate and Rhythm: Normal rate and regular rhythm.  ?   Heart sounds: Normal heart sounds, S1 normal and S2 normal. No murmur heard. ?Pulmonary:  ?   Effort: Pulmonary effort is normal.  ?   Breath sounds: Normal breath sounds. No stridor. No wheezing, rhonchi or rales.  ?   Comments: Clear to auscultation  bilaterally ?Musculoskeletal:  ?   Right lower leg: No edema.  ?   Left lower leg: No edema.  ?Neurological:  ?   Mental Status: He is alert.  ?Psychiatric:     ?   Behavior: Behavior is cooperative.  ? ? ? ?UC Treatments / Results  ?Labs ?(all labs ordered are listed, but only abnormal results are displayed) ?Labs Reviewed  ?COMPREHENSIVE METABOLIC PANEL  ? ? ?EKG ? ? ?Radiology ?No results found. ? ?Procedures ?Procedures (including critical care time) ? ?Medications Ordered in UC ?Medications - No data to display ? ?Initial Impression / Assessment and Plan / UC Course  ?I have reviewed the triage vital signs and the nursing notes. ? ?Pertinent labs & imaging results that were available during my care of the patient were reviewed by me and considered in my medical decision making (see chart for details). ? ?  ? ?Blood pressure is slightly above goal in clinic but improved.  This will normalize with regular use of medication as patient has been rationing it as of recently.  He denies any signs/symptoms of endorgan damage that would warrant emergent evaluation.  CMP obtained today to monitor electrolytes as well as kidney/liver function.  We will contact patient if lab work changes of treatment plan.  Recommended lifestyle treatment measures including avoiding salt, regular exercise.  He is to monitor his blood pressure at home.  We will try to establish with PCP in the area; referral to obesity assistance placed.  Discussed that if he develops any chest pain, shortness of breath, headache, vision change, dizziness, leg swelling in the setting of high blood pressure he needs to be seen immediately to which he expressed understanding.  Strict return precautions given. ? ?Final Clinical Impressions(s) / UC Diagnoses  ? ?Final diagnoses:  ?Essential hypertension  ?Elevated blood pressure reading  ?Medication refill  ? ? ? ?Discharge Instructions   ? ?  ?Please start taking your medication daily as prescribed.  I will  contact you if your lab work is abnormal and we need to make any adjustments to your treatment plan.  Someone to contact you to schedule a primary care appointment.  Try to avoid salt as well as NSAIDs (aspirin, ibuprofen/Advil, naproxen/Aleve).  If you develop any chest pain, shortness of breath, headache, vision change in the setting of high blood pressure you need to  be seen immediately. ? ? ? ? ?ED Prescriptions   ? ? Medication Sig Dispense Auth. Provider  ? olmesartan-hydrochlorothiazide (BENICAR HCT) 40-25 MG tablet Take 1 tablet by mouth daily. 30 tablet Tamaria Dunleavy K, PA-C  ? ?  ? ?PDMP not reviewed this encounter. ?  ?Terrilee Croak, PA-C ?10/17/21 1041 ? ?

## 2021-10-17 NOTE — ED Triage Notes (Signed)
Pt here for hypertension; pt lost his PCP and needs refill ?

## 2021-10-17 NOTE — Discharge Instructions (Signed)
Please start taking your medication daily as prescribed.  I will contact you if your lab work is abnormal and we need to make any adjustments to your treatment plan.  Someone to contact you to schedule a primary care appointment.  Try to avoid salt as well as NSAIDs (aspirin, ibuprofen/Advil, naproxen/Aleve).  If you develop any chest pain, shortness of breath, headache, vision change in the setting of high blood pressure you need to be seen immediately. ?

## 2021-10-18 ENCOUNTER — Telehealth: Payer: Self-pay

## 2021-10-18 LAB — COMPREHENSIVE METABOLIC PANEL
ALT: 28 IU/L (ref 0–44)
AST: 32 IU/L (ref 0–40)
Albumin/Globulin Ratio: 2.3 — ABNORMAL HIGH (ref 1.2–2.2)
Albumin: 5.2 g/dL — ABNORMAL HIGH (ref 3.8–4.9)
Alkaline Phosphatase: 82 IU/L (ref 44–121)
BUN/Creatinine Ratio: 12 (ref 10–24)
BUN: 11 mg/dL (ref 8–27)
Bilirubin Total: 0.8 mg/dL (ref 0.0–1.2)
CO2: 19 mmol/L — ABNORMAL LOW (ref 20–29)
Calcium: 10.4 mg/dL — ABNORMAL HIGH (ref 8.6–10.2)
Chloride: 96 mmol/L (ref 96–106)
Creatinine, Ser: 0.92 mg/dL (ref 0.76–1.27)
Globulin, Total: 2.3 g/dL (ref 1.5–4.5)
Glucose: 82 mg/dL (ref 70–99)
Potassium: 5.1 mmol/L (ref 3.5–5.2)
Sodium: 133 mmol/L — ABNORMAL LOW (ref 134–144)
Total Protein: 7.5 g/dL (ref 6.0–8.5)
eGFR: 95 mL/min/{1.73_m2} (ref >59)

## 2021-10-18 MED ORDER — HYDROCHLOROTHIAZIDE 25 MG PO TABS: 25.0000 mg | ORAL_TABLET | Freq: Every day | ORAL | 1 refills | Status: DC

## 2021-10-18 MED ORDER — OLMESARTAN MEDOXOMIL 40 MG PO TABS: 40.0000 mg | ORAL_TABLET | Freq: Every day | ORAL | 1 refills | Status: DC

## 2021-10-18 NOTE — Telephone Encounter (Signed)
Medication refill sent: Benicar '40mg'$  daily and HCTZ '25mg'$  daily ?

## 2021-11-01 ENCOUNTER — Inpatient Hospital Stay: Payer: 59 | Admitting: Family

## 2021-11-05 NOTE — Progress Notes (Signed)
Subjective:    David Townsend - 61 y.o. male MRN [id number]  Date of birth: [birth year]  HPI  David Townsend is to establish care.  Current issues and/or concerns: Urgent care follow-up: 10/17/2021 [facility] per PA note:  Blood pressure is slightly above goal in clinic but improved.  This will normalize with regular use of medication as patient has been rationing it as of recently.  He denies any signs/symptoms of endorgan damage that would warrant emergent evaluation.  CMP obtained today to monitor electrolytes as well as kidney/liver function.  We will contact patient if lab work changes of treatment plan.  Recommended lifestyle treatment measures including avoiding salt, regular exercise.  He is to monitor his blood pressure at home.  We will try to establish with PCP in the area; referral to obesity assistance placed.  Discussed that if he develops any chest pain, shortness of breath, headache, vision change, dizziness, leg swelling in the setting of high blood pressure he needs to be seen immediately to which he expressed understanding.  Strict return precautions given.  11/10/2021: Med Adherence: '[x]'$  Yes    '[]'$  No Medication side effects: '[]'$  Yes    '[x]'$  No Adherence with salt restriction (low-salt diet): '[]'$  Yes  '[x]'$  No Exercise: golf  Home Monitoring?: '[x]'$  Yes    '[]'$  No Monitoring Frequency: '[x]'$  Yes    '[]'$  No Home BP results range: '[x]'$  Yes, 130's-150's/80's-90's Smoking '[x]'$  Yes, cigarettes and occasional smoking other substances rarely. Reports began smoking in his 20's. Currently smoking a few packs daily. He is not ready to quit. SOB? '[]'$  Yes    '[x]'$  No Chest Pain?: '[]'$  Yes    '[x]'$  No Comments: Reports he does drink beer and or several mixed alcohol beverages almost daily but tries to limit intake when able to do so. 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 They do have home health aides and friends come over to his father's help to help sometimes which allows him to  take personal time for himself. Reports he is established with Urology for prostate related concerns. Also, reports he is established with a doctor who manages his urinary catheter which he has had for about 1 year without complications.      11/10/2021   10:01 AM 11/01/2015    2:28 PM 10/27/2015    1:01 PM 10/24/2015    1:26 PM  Depression screen PHQ 2/9  Decreased Interest 0 0 0 0  Down, Depressed, Hopeless 0 0 0 0  PHQ - 2 Score 0 0 0 0     ROS per HPI    Health Maintenance: Health Maintenance Due  Topic Date Due   TETANUS/TDAP  Never done   Zoster Vaccines- Shingrix (1 of 2) Never done   COLONOSCOPY (Pts 45-17yr Insurance coverage will need to be confirmed)  Never done   COVID-19 Vaccine (4 - Booster for [employer]) 04/12/2020     Past Medical History: Patient Active Problem List   Diagnosis Date Noted   ETOH abuse 11/01/2015     Social History   reports that he has been smoking cigarettes. He has a 68.00 pack-year smoking history. He has been exposed to tobacco smoke. He has never used smokeless tobacco. He reports current alcohol use. He reports that he does not use drugs.   Family History  Family history is unknown by patient.   Medications: reviewed and updated   Objective:   Physical Exam BP (!) 143/81 (BP Location: Left  Arm, Patient Position: Sitting, Cuff Size: Normal)   Pulse 97   Temp 98.3 F (36.8 C)   Resp 18   Ht '5\' 7"'$  (1.702 m)   Wt 166 lb (75.3 kg)   SpO2 100%   BMI 26.00 kg/m   Physical Exam HENT:     Head: Normocephalic and atraumatic.  Eyes:     Extraocular Movements: Extraocular movements intact.     Conjunctiva/sclera: Conjunctivae normal.     Pupils: Pupils are equal, round, and reactive to light.  Cardiovascular:     Rate and Rhythm: Normal rate and regular rhythm.     Pulses: Normal pulses.     Heart sounds: Normal heart sounds.  Pulmonary:     Effort: Pulmonary effort is normal.     Breath sounds: Normal breath sounds.   Musculoskeletal:     Cervical back: Normal range of motion and neck supple. No tenderness.  Neurological:     General: No focal deficit present.     Mental Status: He is alert and oriented to person, place, and time.  Psychiatric:        Mood and Affect: Mood normal.        Behavior: Behavior normal.     Assessment & Plan:  1. Encounter to establish care - Patient presents today to establish care.  - Return for annual physical examination, labs, and health maintenance. Arrive fasting meaning having no food for at least 8 hours prior to appointment. You may have only water or black coffee. Please take scheduled medications as normal.  2. Essential (primary) hypertension - Blood pressure not at goal during today's visit. Patient asymptomatic without chest pressure, chest pain, palpitations, shortness of breath, worst headache of life, and any additional red flag symptoms. - Continue Olmesartan as prescribed. No refills needed as of present. - Increase Hydrochlorothiazide from 25 mg daily to 50 mg daily. No refills needed as of present. Instructed to take two 25 mg tablets daily to equal the new recommended dose. - Counseled on blood pressure goal of less than 130/80, low-sodium, DASH diet, medication compliance, and 150 minutes of moderate intensity exercise per week as tolerated. Counseled on medication adherence and adverse effects. - BMP to evaluate kidney function and electrolyte balance. - Follow-up with primary provider in 2 weeks or sooner if needed for blood pressure check.  - Basic Metabolic Panel  3. Encounter for smoking cessation counseling - Counseled to quit.  Discussed health risk associated with smoking and cessation methods including NRT, Chantix, Buproprion. Patient declined.  - Follow-up with primary provider as scheduled.  4. Admits to alcohol consumption - Counseled to quit. Patient not ready to do so as of present.     Patient was given clear instructions to go  to Emergency Department or return to medical center if symptoms don't improve, worsen, or new problems develop.The patient verbalized understanding.  I discussed the assessment and treatment plan with the patient. The patient was provided an opportunity to ask questions and all were answered. The patient agreed with the plan and demonstrated an understanding of the instructions.   The patient was advised to call back or seek an in-person evaluation if the symptoms worsen or if the condition fails to improve as anticipated.    Durene Fruits, NP 11/10/2021, 10:11 AM Primary Care at [facility]

## 2021-11-08 ENCOUNTER — Institutional Professional Consult (permissible substitution): Payer: 59 | Admitting: Physician Assistant

## 2021-11-10 ENCOUNTER — Ambulatory Visit (INDEPENDENT_AMBULATORY_CARE_PROVIDER_SITE_OTHER): Payer: 59 | Admitting: Family

## 2021-11-10 ENCOUNTER — Encounter: Payer: Self-pay | Admitting: Family

## 2021-11-10 VITALS — BP 143/81 | HR 97 | Temp 98.3°F | Resp 18 | Ht 67.0 in | Wt 166.0 lb

## 2021-11-10 DIAGNOSIS — I10 Essential (primary) hypertension: Secondary | ICD-10-CM

## 2021-11-10 DIAGNOSIS — Z716 Tobacco abuse counseling: Secondary | ICD-10-CM

## 2021-11-10 DIAGNOSIS — Z7689 Persons encountering health services in other specified circumstances: Secondary | ICD-10-CM | POA: Diagnosis not present

## 2021-11-10 DIAGNOSIS — Z789 Other specified health status: Secondary | ICD-10-CM

## 2021-11-10 NOTE — Patient Instructions (Signed)
Thank you for choosing Primary Care at [facility] for your medical home!    David Townsend was seen by Camillia Herter, NP today.   David Townsend primary care provider is Durene Fruits, NP.   For the best care possible,  you should try to see Durene Fruits, NP whenever you come to office.   We look forward to seeing you again soon!  If you have any questions about your visit today,  please call us at [phone]  Or feel free to reach your provider via [facility].   Keeping you healthy   Get these tests Blood pressure- Have your blood pressure checked once a year by your healthcare provider.  Normal blood pressure is 120/80. Weight- Have your body mass index (BMI) calculated to screen for obesity.  BMI is a measure of body fat based on height and weight. You can also calculate your own BMI at [employer]velB[employer].it. Cholesterol- Have your cholesterol checked regularly starting at age 22, sooner may be necessary if you have diabetes, high blood pressure, if a family member developed heart diseases at an early age or if you smoke.  Chlamydia, HIV, and other sexual transmitted disease- Get screened each year until the age of 22 then within three months of each new sexual partner. Diabetes- Have your blood sugar checked regularly if you have high blood pressure, high cholesterol, a family history of diabetes or if you are overweight.   Get these vaccines Flu shot- Every fall. Tetanus shot- Every 10 years. Menactra- Single dose; prevents meningitis.   Take these steps Don't smoke- If you do smoke, ask your healthcare provider about quitting. For tips on how to quit, go to www.smokefree.gov or call 1-800-QUIT-NOW. Be physically active- Exercise 5 days a week for at least 30 minutes.  If you are not already physically active start slow and gradually work up to 30 minutes of moderate physical activity.  Examples of moderate activity include walking briskly, mowing the yard, dancing, swimming  bicycling, etc. Eat a healthy diet- Eat a variety of healthy foods such as fruits, vegetables, low fat milk, low fat cheese, yogurt, lean meats, poultry, fish, beans, tofu, etc.  For more information on healthy eating, go to www.thenutritionsource.org Drink alcohol in moderation- Limit alcohol intake two drinks or less a day.  Never drink and drive. Dentist- Brush and floss teeth twice daily; visit your dentis twice a year. Depression-Your emotional health is as important as your physical health.  If you're feeling down, losing interest in things you normally enjoy please talk with your healthcare provider. Gun Safety- If you keep a gun in your home, keep it unloaded and with the safety lock on.  Bullets should be stored separately. Helmet use- Always wear a helmet when riding a motorcycle, bicycle, rollerblading or skateboarding. Safe sex- If you may be exposed to a sexually transmitted infection, use a condom Seat belts- Seat bels can save your life; always wear one. Smoke/Carbon Monoxide detectors- These detectors need to be installed on the appropriate level of your home.  Replace batteries at least once a year. Skin Cancer- When out in the sun, cover up and use sunscreen SPF 15 or higher. Violence- If anyone is threatening or hurting you, please tell your healthcare provider.

## 2021-11-10 NOTE — Progress Notes (Signed)
.  Pt presents to establish care,   

## 2021-11-11 ENCOUNTER — Other Ambulatory Visit: Payer: Self-pay | Admitting: Family

## 2021-11-11 DIAGNOSIS — I10 Essential (primary) hypertension: Secondary | ICD-10-CM

## 2021-11-11 LAB — BASIC METABOLIC PANEL
BUN/Creatinine Ratio: 12 (ref 10–24)
BUN: 11 mg/dL (ref 8–27)
CO2: 21 mmol/L (ref 20–29)
Calcium: 10.9 mg/dL — ABNORMAL HIGH (ref 8.6–10.2)
Chloride: 101 mmol/L (ref 96–106)
Creatinine, Ser: 0.94 mg/dL (ref 0.76–1.27)
Glucose: 112 mg/dL — ABNORMAL HIGH (ref 70–99)
Potassium: 5.7 mmol/L — ABNORMAL HIGH (ref 3.5–5.2)
Sodium: 140 mmol/L (ref 134–144)
eGFR: 93 mL/min/{1.73_m2} (ref >59)

## 2021-11-24 NOTE — Progress Notes (Signed)
Patient ID: David Townsend, male    DOB: July 09, 1960  MRN: [id number]  CC: Annual Physical Exam  Subjective: David Townsend is a 61 y.o. male who presents for annual physical exam.   His concerns today include:  - Doing well on current blood pressure medications, no issues/concerns.  Patient Active Problem List   Diagnosis Date Noted   ETOH abuse 11/01/2015     Current Outpatient Medications on File Prior to Visit  Medication Sig Dispense Refill   hydrochlorothiazide (HYDRODIURIL) 25 MG tablet Take 1 tablet (25 mg total) by mouth daily. 90 tablet 1   olmesartan (BENICAR) 40 MG tablet Take 1 tablet (40 mg total) by mouth daily. 90 tablet 1   No current facility-administered medications on file prior to visit.    Allergies  Allergen Reactions   Penicillins     Whelps  Has patient had a PCN reaction causing immediate rash, facial/tongue/throat swelling, SOB or lightheadedness with hypotension: Y Has patient had a PCN reaction causing severe rash involving mucus membranes or skin necrosis: Y Has patient had a PCN reaction that required hospitalization: N Has patient had a PCN reaction occurring within the last 10 years: N If all of the above answers are "NO", then may proceed with Cephalosporin use.     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   Lives alone.   [employer]   Social Determinants of Health   Financial Resource Strain: Not on file  Food Insecurity: Not on file  Transportation Needs: Not on file  Physical Activity: Not on file  Stress: Not on file  Social Connections: Not on file  Intimate Partner Violence: Not on file    Family History  Family history unknown: Yes    Past Surgical History:  Procedure Laterality Date   NO PAST SURGERIES     PENILE BIOPSY N/A 01/12/2020   Procedure: EXCISION OF PENILE LESION;  Surgeon: Robley Fries, MD;  Location: [facility];  Service: Urology;  Laterality: N/A;    ROS: Review of Systems Negative except as stated above  PHYSICAL EXAM: BP 123/77 (BP Location: Left Arm, Patient Position: Sitting, Cuff Size: Normal)   Pulse 93   Temp 98.3 F (36.8 C)   Resp 16   Ht 5' 10.32" (1.786 m)   Wt 168 lb (76.2 kg)   SpO2 98%   BMI 23.89 kg/m   Physical Exam HENT:     Head: Normocephalic and atraumatic.     Right Ear: Tympanic membrane, ear canal and external ear normal.  Left Ear: Tympanic membrane, ear canal and external ear normal.     Nose: Nose normal.     Mouth/Throat:     Mouth: Mucous membranes are moist.     Pharynx: Oropharynx is clear.  Eyes:     Extraocular Movements: Extraocular movements intact.     Conjunctiva/sclera: Conjunctivae normal.     Pupils: Pupils are equal, round, and reactive to light.  Cardiovascular:     Rate and Rhythm: Normal rate and regular rhythm.     Pulses: Normal pulses.     Heart sounds: Normal heart sounds.  Pulmonary:     Effort: Pulmonary effort is normal.     Breath sounds: Normal breath sounds.  Abdominal:     General: Bowel sounds are normal.     Palpations: Abdomen is soft.  Genitourinary:    Comments: Patient declined exam. Musculoskeletal:        General: Normal range of motion.     Right shoulder:  Normal.     Left shoulder: Normal.     Right upper arm: Normal.     Left upper arm: Normal.     Right elbow: Normal.     Left elbow: Normal.     Right forearm: Normal.     Left forearm: Normal.     Right wrist: Normal.     Left wrist: Normal.     Right hand: Normal.     Left hand: Normal.     Cervical back: Normal, normal range of motion and neck supple.     Thoracic back: Normal.     Lumbar back: Normal.     Right hip: Normal.     Left hip: Normal.     Right upper leg: Normal.     Left upper leg: Normal.     Right knee: Normal.     Left knee: Normal.     Right lower leg: Normal.     Left lower leg: Normal.     Right ankle: Normal.     Left ankle: Normal.     Right foot: Normal.     Left foot: Normal.  Skin:    General: Skin is warm and dry.     Capillary Refill: Capillary refill takes less than 2 seconds.  Neurological:     General: No focal deficit present.     Mental Status: He is alert and oriented to person, place, and time.  Psychiatric:        Mood and Affect: Mood normal.        Behavior: Behavior normal.    ASSESSMENT AND PLAN: 1. Annual physical exam - Counseled on 150 minutes of exercise per week as tolerated, healthy eating (including decreased daily intake of saturated fats, cholesterol, added sugars, sodium), STI prevention, and routine healthcare maintenance.  2. Screening for metabolic disorder - ZOX09+UEAV to check kidney function, liver function, and electrolyte balance.  - CMP14+EGFR  3. Screening for deficiency anemia - CBC to screen for anemia. - CBC  4. Diabetes mellitus screening - Hemoglobin A1c to screen for pre-diabetes/diabetes. - Hemoglobin A1c  5. Screening cholesterol level - Lipid panel to screen for high cholesterol.  - Lipid panel  6. Thyroid disorder screen - TSH to check thyroid function.  - TSH  7. Colon cancer screening - Cologuard screening for colon cancer.  - Cologuard  8. Essential (primary) hypertension -  Blood pressure at goal.  - Continue Hydrochlorthiazide 50 mg daily by mouth. No refills needed as of present.  -  Continue Olmesartan 40 mg daily by mouth. No refills needed as of present.  - Counseled on blood pressure goal of less than 130/80, low-sodium, DASH diet, medication compliance, and 150 minutes of moderate intensity exercise per week as tolerated. Counseled on medication adherence and adverse effects. - Follow-up with primary provider in 4 months or sooner if needed.    Patient was given the opportunity to ask questions.  Patient verbalized understanding of the plan and was able to repeat key elements of the plan. Patient was given clear instructions to go to Emergency Department or return to medical center if symptoms don't improve, worsen, or new problems develop.The patient verbalized understanding.   Orders Placed This Encounter  Procedures   CBC   Lipid panel   TSH   CMP14+EGFR   Hemoglobin A1c   Cologuard     Return in about 1 year (around 12/01/2022) for Physical per patient preference, Follow-Up or next available 4 months HTN.  Camillia Herter, NP

## 2021-11-30 ENCOUNTER — Ambulatory Visit (INDEPENDENT_AMBULATORY_CARE_PROVIDER_SITE_OTHER): Payer: 59 | Admitting: Family

## 2021-11-30 ENCOUNTER — Encounter: Payer: Self-pay | Admitting: Family

## 2021-11-30 VITALS — BP 123/77 | HR 93 | Temp 98.3°F | Resp 16 | Ht 70.32 in | Wt 168.0 lb

## 2021-11-30 DIAGNOSIS — Z1211 Encounter for screening for malignant neoplasm of colon: Secondary | ICD-10-CM

## 2021-11-30 DIAGNOSIS — Z0001 Encounter for general adult medical examination with abnormal findings: Secondary | ICD-10-CM

## 2021-11-30 DIAGNOSIS — Z130 Encounter for screening for diseases of the blood and blood-forming organs and certain disorders involving the immune mechanism: Secondary | ICD-10-CM

## 2021-11-30 DIAGNOSIS — I10 Essential (primary) hypertension: Secondary | ICD-10-CM | POA: Diagnosis not present

## 2021-11-30 DIAGNOSIS — Z0000 Encounter for general adult medical examination without abnormal findings: Secondary | ICD-10-CM

## 2021-11-30 DIAGNOSIS — Z13220 Encounter for screening for lipoid disorders: Secondary | ICD-10-CM

## 2021-11-30 DIAGNOSIS — Z13228 Encounter for screening for other metabolic disorders: Secondary | ICD-10-CM

## 2021-11-30 DIAGNOSIS — Z131 Encounter for screening for diabetes mellitus: Secondary | ICD-10-CM

## 2021-11-30 DIAGNOSIS — Z1329 Encounter for screening for other suspected endocrine disorder: Secondary | ICD-10-CM

## 2021-11-30 NOTE — Progress Notes (Signed)
.  Pt presents for annual physical exam  

## 2021-11-30 NOTE — Patient Instructions (Signed)

## 2021-12-01 ENCOUNTER — Other Ambulatory Visit: Payer: Self-pay | Admitting: Family

## 2021-12-01 DIAGNOSIS — D649 Anemia, unspecified: Secondary | ICD-10-CM

## 2021-12-01 LAB — CMP14+EGFR
ALT: 22 IU/L (ref 0–44)
AST: 24 IU/L (ref 0–40)
Albumin/Globulin Ratio: 2.3 — ABNORMAL HIGH (ref 1.2–2.2)
Albumin: 5.1 g/dL — ABNORMAL HIGH (ref 3.8–4.9)
Alkaline Phosphatase: 67 IU/L (ref 44–121)
BUN/Creatinine Ratio: 17 (ref 10–24)
BUN: 19 mg/dL (ref 8–27)
Bilirubin Total: 0.8 mg/dL (ref 0.0–1.2)
CO2: 20 mmol/L (ref 20–29)
Calcium: 10.2 mg/dL (ref 8.6–10.2)
Chloride: 92 mmol/L — ABNORMAL LOW (ref 96–106)
Creatinine, Ser: 1.13 mg/dL (ref 0.76–1.27)
Globulin, Total: 2.2 g/dL (ref 1.5–4.5)
Glucose: 113 mg/dL — ABNORMAL HIGH (ref 70–99)
Potassium: 5.1 mmol/L (ref 3.5–5.2)
Sodium: 130 mmol/L — ABNORMAL LOW (ref 134–144)
Total Protein: 7.3 g/dL (ref 6.0–8.5)
eGFR: 74 mL/min/{1.73_m2} (ref >59)

## 2021-12-01 LAB — CBC
Hematocrit: 31.4 % — ABNORMAL LOW (ref 37.5–51.0)
Hemoglobin: 11.2 g/dL — ABNORMAL LOW (ref 13.0–17.7)
MCH: 39.3 pg — ABNORMAL HIGH (ref 26.6–33.0)
MCHC: 35.7 g/dL (ref 31.5–35.7)
MCV: 110 fL — ABNORMAL HIGH (ref 79–97)
Platelets: 232 10*3/uL (ref 150–450)
RBC: 2.85 x10E6/uL — ABNORMAL LOW (ref 4.14–5.80)
RDW: 12.9 % (ref 11.6–15.4)
WBC: 8.7 10*3/uL (ref 3.4–10.8)

## 2021-12-01 LAB — LIPID PANEL
Chol/HDL Ratio: 4.9 ratio (ref 0.0–5.0)
Cholesterol, Total: 281 mg/dL — ABNORMAL HIGH (ref 100–199)
HDL: 57 mg/dL (ref >39)
LDL Chol Calc (NIH): 187 mg/dL — ABNORMAL HIGH (ref 0–99)
Triglycerides: 198 mg/dL — ABNORMAL HIGH (ref 0–149)
VLDL Cholesterol Cal: 37 mg/dL (ref 5–40)

## 2021-12-01 LAB — TSH: TSH: 2.83 u[IU]/mL (ref 0.450–4.500)

## 2021-12-01 LAB — HEMOGLOBIN A1C
Est. average glucose Bld gHb Est-mCnc: 103 mg/dL
Hgb A1c MFr Bld: 5.2 % (ref 4.8–5.6)

## 2021-12-04 ENCOUNTER — Ambulatory Visit: Payer: Self-pay

## 2021-12-04 NOTE — Telephone Encounter (Signed)
Patient called, left VM to return the call to the office to discuss with a nurse.   Summary: Low iron and elevated Cholestorol advice   Pt is calling to ask advice - Pt reports that his iron low and cholesterol was elevated. Pt went to get flax seed and ferrous. Pt would like to review with nurse.

## 2021-12-04 NOTE — Telephone Encounter (Signed)
  Chief Complaint: info about meds Symptoms: none Frequency: na Pertinent Negatives: Patient denies na Disposition: '[]'$ ED /'[]'$ Urgent Care (no appt availability in office) / '[]'$ Appointment(In office/virtual)/ '[]'$  [place] [facility]/ '[]'$ Home Care/ '[]'$ Refused Recommended Disposition /'[]'$ [place] [facility]/  Follow-up with PCP'[x]'$  Additional Notes: Pt called to state he is going to take Flaxseed and Ferrous Gluconate '27mg'$  until time to recheck blood. We discussed the diet and exercise Amy recommended. Pt will try all these methods until returns for lab work.   Reason for Disposition  Caller has medicine question only, adult not sick, AND triager answers question  Answer Assessment - Initial Assessment Questions 1. REASON FOR CALL or QUESTION: "What is your reason for calling today?" or "How can I best help you?" or "What question do you have that I can help answer?"     To inform  MD that he is taking flaxseed and Ferrous Gluconate '27mg'$   Protocols used: Information Only Call - No Triage-A-AH, Medication Question Call-A-AH

## 2021-12-06 ENCOUNTER — Ambulatory Visit: Payer: Self-pay | Admitting: *Deleted

## 2021-12-06 NOTE — Telephone Encounter (Signed)
Summary: pt told by cancer dr to let PCP be aware of new meds   Pt states that his cancer dr has just started on new medication and stated that he needs to let PCP be aware of new meds. Pls fu with pt at [phone]. Pt states that if he does not answer to just leave a message and he will return call.       Chief Complaint: Information Symptoms: Pt states oncologist advised to alert PCP that he was started on Erleada for his prostate cancer. He is on '60mg'$ , 4 times a day. States he will start med tomorrow.  Frequency:  Pertinent Negatives: Patient denies  Disposition: '[]'$ ED /'[]'$ Urgent Care (no appt availability in office) / '[]'$ Appointment(In office/virtual)/ '[]'$  [facility]/ '[]'$ Home Care/ '[]'$ Refused Recommended Disposition /'[]'$ [place] [facility]/ '[x]'$  Follow-up with PCP Additional Notes: Assured pt NT would alert PCP.  Reason for Disposition  [1] Caller has NON-URGENT medicine question about med that PCP prescribed AND [2] triager unable to answer question  Answer Assessment - Initial Assessment Questions 1. NAME of MEDICATION: "What medicine are you calling about?"     Erleada  Protocols used: Medication Question Call-A-AH

## 2021-12-07 ENCOUNTER — Encounter: Payer: 59 | Admitting: Family

## 2021-12-11 ENCOUNTER — Telehealth: Payer: Self-pay | Admitting: Family

## 2021-12-11 ENCOUNTER — Other Ambulatory Visit: Payer: Self-pay | Admitting: Family

## 2021-12-11 DIAGNOSIS — I10 Essential (primary) hypertension: Secondary | ICD-10-CM

## 2021-12-11 MED ORDER — HYDROCHLOROTHIAZIDE 50 MG PO TABS: 50.0000 mg | ORAL_TABLET | Freq: Every day | ORAL | 2 refills | Status: DC

## 2021-12-11 NOTE — Telephone Encounter (Signed)
Pt stated his Rx was increased to 2 pills instead of 1 but when he tried to get it refilled through his pharmacy he was told it is too early  Medication Refill - Medication: hydrochlorothiazide (HYDRODIURIL) 25 MG tablet  Has the patient contacted their pharmacy? Yes.   Pt told to contact provider  Preferred Pharmacy (with phone number or street name):  [facility] [place], [place] Phone:  [phone]  Fax:  [phone]     Has the patient been seen for an appointment in the last year OR does the patient have an upcoming appointment? Yes.    Agent: Please be advised that RX refills may take up to 3 business days. We ask that you follow-up with your pharmacy.

## 2021-12-11 NOTE — Telephone Encounter (Signed)
Noted last OV note to increase HCTZ to 2 tabs daily, now patient will need a new Rx sent to the pharmacy.

## 2021-12-11 NOTE — Telephone Encounter (Signed)
Order complete. 

## 2022-02-16 ENCOUNTER — Telehealth: Payer: Self-pay | Admitting: Family

## 2022-02-16 NOTE — Telephone Encounter (Signed)
Schedule appointment?

## 2022-02-16 NOTE — Telephone Encounter (Signed)
Mickel Baas calling from [facility] is calling to report that the pt had an abnormal sodium 124. Report will be faxed. Please advise CB- [phone]

## 2022-03-19 NOTE — Progress Notes (Signed)
Patient ID: David Townsend, male    DOB: 03-Oct-1960  MRN: [id number]  CC: Chronic Care Management   Subjective: David Townsend is a 61 y.o. male who presents for chronic care management.   His concerns today include:  - Taking Olmesartan as prescribed. No longer taking Hydrochlorothiazide. States David Colla, MD at [facility] (who manages history of prostate cancer) told him to quit taking Hydrochlorothiazide because it was depleting his sodium levels. He does not check blood pressures in the home setting. Denies red flag symptoms. - Taking over-the-counter flax seed oil to help with cholesterol.  - Feeling tired. Exercising routinely. Current smoker of at least 40 years, not ready to quit, and declined lung cancer screening.Taking over-the-counter Vitamin D and iron supplement. Discussed with patient the importance of establishing with Hematology for evaluation and management of chronic anemia. Patient agreeable.   Patient Active Problem List   Diagnosis Date Noted   ETOH abuse 11/01/2015     Current Outpatient Medications on File Prior to Visit  Medication Sig Dispense Refill   ERLEADA 60 MG tablet Take 240 mg by mouth daily.     hydrochlorothiazide (HYDRODIURIL) 50 MG tablet Take 1 tablet (50 mg total) by mouth daily. 30 tablet 2   No current facility-administered medications on file prior to visit.    Allergies  Allergen Reactions   Penicillins     Whelps  Has patient had a PCN reaction causing immediate rash, facial/tongue/throat swelling, SOB or lightheadedness with hypotension: Y Has patient had a PCN reaction causing severe rash involving mucus membranes or skin necrosis: Y Has patient had a PCN reaction that required hospitalization: N Has patient had a PCN reaction occurring within the last 10 years: N If all of the above answers are "NO", then may proceed with Cephalosporin use.     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   Lives alone.   [employer]   Social Determinants of Health   Financial Resource Strain: Not on file  Food Insecurity: Not on file  Transportation Needs: Not on file  Physical Activity: Not on file  Stress: Not on file  Social Connections: Not on file  Intimate Partner Violence: Not on file    Family History  Family history unknown: Yes    Past Surgical History:  Procedure Laterality Date   NO PAST SURGERIES     PENILE BIOPSY N/A 01/12/2020   Procedure: EXCISION OF PENILE LESION;  Surgeon: Robley Fries, MD;  Location: [facility];  Service: Urology;  Laterality: N/A;  ROS: Review of Systems Negative except as stated above  PHYSICAL EXAM: BP (!) 146/71   Pulse 92   Temp 98.3 F (36.8 C)   Resp 16   Ht 5' 10.32" (1.786 m)   Wt 160 lb (72.6 kg)   SpO2 98%   BMI 22.75 kg/m   Physical Exam HENT:     Head: Normocephalic and atraumatic.  Eyes:     Extraocular Movements: Extraocular movements intact.     Conjunctiva/sclera: Conjunctivae normal.     Pupils: Pupils are equal, round, and reactive to light.  Cardiovascular:     Rate and Rhythm: Normal rate and regular rhythm.     Pulses: Normal pulses.     Heart sounds: Normal heart sounds.  Pulmonary:     Effort:  Pulmonary effort is normal.     Breath sounds: Normal breath sounds.  Musculoskeletal:     Cervical back: Normal range of motion and neck supple.  Neurological:     General: No focal deficit present.     Mental Status: He is alert and oriented to person, place, and time.  Psychiatric:        Mood and Affect: Mood normal.        Behavior: Behavior normal.    ASSESSMENT AND PLAN: 1. Primary hypertension - Blood pressure not at goal during today's visit. Patient asymptomatic without chest pressure, chest pain, palpitations, shortness of breath, worst headache of life, and any additional red flag symptoms. - Continue Olmesartan as prescribed.  - Begin Amlodipine as prescribed. Counseled on medication adherence/adverse effects. - Counseled on blood pressure goal of less than 130/80, low-sodium, DASH diet, medication compliance, and 150 minutes of moderate intensity exercise per week as tolerated. Counseled on medication adherence and adverse effects. - Follow-up with primary provider in 2 weeks or sooner if needed for blood pressure check.  - olmesartan (BENICAR) 40 MG tablet; Take 1 tablet (40 mg total) by mouth daily.  Dispense: 30 tablet; Refill: 2 - amLODipine (NORVASC) 5 MG tablet; Take 1 tablet (5 mg total) by mouth daily.  Dispense: 30 tablet; Refill: 2  2. Screening cholesterol level - Routine screening.  - Lipid Panel  3. Chronic anemia - Referral to Hematology / Oncology for further evaluation and management.  - Ambulatory referral to Hematology / Oncology  4. Current smoker 5. Lung cancer screening declined by patient - Counseled to quit.   - Patient declined lung cancer screening.     Patient was given the opportunity to ask questions.  Patient verbalized understanding of the plan and was able to repeat key elements of the plan. Patient was given clear instructions to go to Emergency Department or return to medical center if symptoms don't improve, worsen, or new problems  develop.The patient verbalized understanding.   Orders Placed This Encounter  Procedures   Lipid Panel   Ambulatory referral to Hematology / Oncology     Requested Prescriptions   Signed Prescriptions Disp Refills   olmesartan (BENICAR) 40 MG tablet 30 tablet 2    Sig: Take 1 tablet (40 mg total) by mouth daily.   amLODipine (NORVASC) 5 MG tablet 30 tablet 2    Sig: Take 1 tablet (5 mg total) by mouth daily.    Return in about 2 weeks (around 04/09/2022) for Follow-Up or next available bp check.  Camillia Herter, NP

## 2022-03-26 ENCOUNTER — Ambulatory Visit (INDEPENDENT_AMBULATORY_CARE_PROVIDER_SITE_OTHER): Payer: 59 | Admitting: Family

## 2022-03-26 ENCOUNTER — Encounter: Payer: Self-pay | Admitting: Family

## 2022-03-26 VITALS — BP 146/71 | HR 92 | Temp 98.3°F | Resp 16 | Ht 70.32 in | Wt 160.0 lb

## 2022-03-26 DIAGNOSIS — Z5320 Procedure and treatment not carried out because of patient's decision for unspecified reasons: Secondary | ICD-10-CM

## 2022-03-26 DIAGNOSIS — Z13220 Encounter for screening for lipoid disorders: Secondary | ICD-10-CM

## 2022-03-26 DIAGNOSIS — F17210 Nicotine dependence, cigarettes, uncomplicated: Secondary | ICD-10-CM | POA: Diagnosis not present

## 2022-03-26 DIAGNOSIS — I10 Essential (primary) hypertension: Secondary | ICD-10-CM

## 2022-03-26 DIAGNOSIS — D649 Anemia, unspecified: Secondary | ICD-10-CM | POA: Diagnosis not present

## 2022-03-26 DIAGNOSIS — F17200 Nicotine dependence, unspecified, uncomplicated: Secondary | ICD-10-CM

## 2022-03-26 MED ORDER — OLMESARTAN MEDOXOMIL 40 MG PO TABS: 40.0000 mg | ORAL_TABLET | Freq: Every day | ORAL | 2 refills | Status: DC

## 2022-03-26 MED ORDER — AMLODIPINE BESYLATE 5 MG PO TABS: 5.0000 mg | ORAL_TABLET | Freq: Every day | ORAL | 2 refills | Status: DC

## 2022-03-26 NOTE — Progress Notes (Signed)
.  Pt presents for chronic care management   -wants to know if he can take 2 iron supplements pills a day instead of one -not taking HCTZ

## 2022-03-27 ENCOUNTER — Other Ambulatory Visit: Payer: Self-pay | Admitting: Family

## 2022-03-27 DIAGNOSIS — E785 Hyperlipidemia, unspecified: Secondary | ICD-10-CM

## 2022-03-27 HISTORY — DX: Hyperlipidemia, unspecified: E78.5

## 2022-03-27 LAB — LIPID PANEL
Chol/HDL Ratio: 4.7 ratio (ref 0.0–5.0)
Cholesterol, Total: 317 mg/dL — ABNORMAL HIGH (ref 100–199)
HDL: 68 mg/dL (ref >39)
LDL Chol Calc (NIH): 203 mg/dL — ABNORMAL HIGH (ref 0–99)
Triglycerides: 237 mg/dL — ABNORMAL HIGH (ref 0–149)
VLDL Cholesterol Cal: 46 mg/dL — ABNORMAL HIGH (ref 5–40)

## 2022-03-27 MED ORDER — ATORVASTATIN CALCIUM 20 MG PO TABS: 20.0000 mg | ORAL_TABLET | Freq: Every day | ORAL | 2 refills | Status: DC

## 2022-03-28 ENCOUNTER — Telehealth: Payer: Self-pay | Admitting: Family

## 2022-03-28 NOTE — Telephone Encounter (Signed)
Copied from [place] [id number]. Topic: General - Other >> Mar 28, 2022  9:54 AM Cyndi Bender wrote: Reason for CRM: Pt requested to speak with Eboney. Pt asked that his call be returned. Cb# [phone]

## 2022-04-03 ENCOUNTER — Telehealth: Payer: Self-pay | Admitting: Hematology and Oncology

## 2022-04-03 NOTE — Telephone Encounter (Signed)
Scheduled appt per 10/23 referral. Pt is aware of appt date and time. Pt is aware to arrive 15 mins prior to appt time and to bring and updated insurance card. Pt is aware of appt location.   

## 2022-04-04 NOTE — Progress Notes (Deleted)
Patient ID: David Townsend, male    DOB: 25-Jan-1961  MRN: [id number]  CC: Blood Pressure Check   Subjective: David Townsend is a 61 y.o. male who presents for blood pressure check.   His concerns today include:  HTN -  - Continue Olmesartan as prescribed.  - Begin Amlodipine as prescribed  Patient Active Problem List   Diagnosis Date Noted   Hyperlipidemia 03/27/2022   ETOH abuse 11/01/2015     Current Outpatient Medications on File Prior to Visit  Medication Sig Dispense Refill   amLODipine (NORVASC) 5 MG tablet Take 1 tablet (5 mg total) by mouth daily. 30 tablet 2   atorvastatin (LIPITOR) 20 MG tablet Take 1 tablet (20 mg total) by mouth daily. 30 tablet 2   ERLEADA 60 MG tablet Take 240 mg by mouth daily.     hydrochlorothiazide (HYDRODIURIL) 50 MG tablet Take 1 tablet (50 mg total) by mouth daily. 30 tablet 2   olmesartan (BENICAR) 40 MG tablet Take 1 tablet (40 mg total) by mouth daily. 30 tablet 2   No current facility-administered medications on file prior to visit.    Allergies  Allergen Reactions   Penicillins     Whelps  Has patient had a PCN reaction causing immediate rash, facial/tongue/throat swelling, SOB or lightheadedness with hypotension: Y Has patient had a PCN reaction causing severe rash involving mucus membranes or skin necrosis: Y Has patient had a PCN reaction that required hospitalization: N Has patient had a PCN reaction occurring within the last 10 years: N If all of the above answers are "NO", then may proceed with Cephalosporin use.     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   Lives alone.   [employer]   Social Determinants of Health   Financial Resource Strain: Not on file  Food Insecurity: Not on file  Transportation Needs: Not on file  Physical Activity: Not on file  Stress: Not on file  Social Connections: Not on file  Intimate Partner Violence: Not on file    Family History  Family history unknown: Yes    Past Surgical History:  Procedure Laterality Date   NO PAST SURGERIES     PENILE BIOPSY N/A 01/12/2020   Procedure: EXCISION OF PENILE LESION;  Surgeon: Robley Fries, MD;  Location: [facility];  Service: Urology;  Laterality: N/A;    ROS: Review of Systems Negative except as stated above  PHYSICAL EXAM: There were no vitals taken for this visit.  Physical Exam  {male adult master:310786} {male adult master:310785}  Latest Ref Rng & Units 11/30/2021   11:32 AM 11/10/2021   10:31 AM 10/17/2021   11:19 AM  CMP  Glucose 70 - 99 mg/dL 113  112  82   BUN 8 - 27 mg/dL '19  11  11   '$ Creatinine 0.76 - 1.27 mg/dL 1.13  0.94  0.92   Sodium 134 - 144 mmol/L 130  140  133   Potassium 3.5 - 5.2 mmol/L 5.1  5.7  5.1   Chloride 96 - 106 mmol/L 92  101  96   CO2 20 - 29 mmol/L '20  21  19   '$ Calcium 8.6 - 10.2 mg/dL 10.2  10.9  10.4   Total Protein 6.0 - 8.5 g/dL 7.3   7.5   Total Bilirubin 0.0 - 1.2 mg/dL 0.8   0.8   Alkaline Phos 44 - 121 IU/L 67   82   AST 0 - 40 IU/L 24   32   ALT 0 - 44 IU/L 22   28    Lipid Panel     Component Value Date/Time   CHOL 317 (H) 03/26/2022 1042   TRIG 237 (H) 03/26/2022 1042   HDL 68 03/26/2022 1042   CHOLHDL 4.7 03/26/2022  1042   CHOLHDL 2.5 10/29/2015 1339   VLDL 25 10/29/2015 1339   LDLCALC 203 (H) 03/26/2022 1042    CBC    Component Value Date/Time   WBC 8.7 11/30/2021 1132   WBC 8.7 08/12/2019 1331   RBC 2.85 (L) 11/30/2021 1132   RBC 2.99 (L) 08/12/2019 1331   HGB 11.2 (L) 11/30/2021 1132   HCT 31.4 (L) 11/30/2021 1132   PLT 232 11/30/2021 1132   MCV 110 (H) 11/30/2021 1132   MCH 39.3 (H) 11/30/2021 1132   MCH 40.1 (H) 08/12/2019 1331   MCHC 35.7 11/30/2021 1132   MCHC 34.3 08/12/2019 1331   RDW 12.9 11/30/2021 1132   LYMPHSABS 2.2 04/29/2018 2040   MONOABS 0.6 04/29/2018 2040   EOSABS 0.1 04/29/2018 2040   BASOSABS 0.1 04/29/2018 2040    ASSESSMENT AND PLAN:  There are no diagnoses linked to this encounter.   Patient was given the opportunity to ask questions.  Patient verbalized understanding of the plan and was able to repeat key elements of the plan. Patient was given clear instructions to go to Emergency Department or return to medical center if symptoms don't improve, worsen, or new problems develop.The patient verbalized understanding.   No orders of the defined types were placed in this encounter.    Requested Prescriptions    No prescriptions requested or ordered in this encounter    No follow-ups on file.  Camillia Herter, NP

## 2022-04-11 ENCOUNTER — Ambulatory Visit: Payer: 59 | Admitting: Family

## 2022-04-11 DIAGNOSIS — I10 Essential (primary) hypertension: Secondary | ICD-10-CM

## 2022-04-17 ENCOUNTER — Telehealth: Payer: Self-pay

## 2022-04-17 NOTE — Telephone Encounter (Signed)
Called medical records at [facility] to request recent office notes. Faxed request to [phone], received fax confirmation.

## 2022-04-23 ENCOUNTER — Inpatient Hospital Stay: Payer: 59 | Attending: Hematology and Oncology | Admitting: Hematology and Oncology

## 2022-04-23 ENCOUNTER — Inpatient Hospital Stay: Payer: 59

## 2022-04-23 ENCOUNTER — Encounter: Payer: Self-pay | Admitting: Hematology and Oncology

## 2022-04-23 ENCOUNTER — Other Ambulatory Visit: Payer: 59

## 2022-04-23 VITALS — BP 146/69 | HR 88 | Temp 97.4°F | Resp 18 | Ht 70.0 in | Wt 164.0 lb

## 2022-04-23 DIAGNOSIS — Z8549 Personal history of malignant neoplasm of other male genital organs: Secondary | ICD-10-CM | POA: Insufficient documentation

## 2022-04-23 DIAGNOSIS — D539 Nutritional anemia, unspecified: Secondary | ICD-10-CM

## 2022-04-23 DIAGNOSIS — D649 Anemia, unspecified: Secondary | ICD-10-CM | POA: Diagnosis present

## 2022-04-23 DIAGNOSIS — Z79899 Other long term (current) drug therapy: Secondary | ICD-10-CM | POA: Diagnosis not present

## 2022-04-23 DIAGNOSIS — C7951 Secondary malignant neoplasm of bone: Secondary | ICD-10-CM | POA: Diagnosis present

## 2022-04-23 DIAGNOSIS — I10 Essential (primary) hypertension: Secondary | ICD-10-CM

## 2022-04-23 DIAGNOSIS — D630 Anemia in neoplastic disease: Secondary | ICD-10-CM

## 2022-04-23 DIAGNOSIS — F1010 Alcohol abuse, uncomplicated: Secondary | ICD-10-CM

## 2022-04-23 DIAGNOSIS — C7952 Secondary malignant neoplasm of bone marrow: Secondary | ICD-10-CM | POA: Diagnosis not present

## 2022-04-23 DIAGNOSIS — F17210 Nicotine dependence, cigarettes, uncomplicated: Secondary | ICD-10-CM | POA: Diagnosis not present

## 2022-04-23 DIAGNOSIS — C61 Malignant neoplasm of prostate: Secondary | ICD-10-CM | POA: Insufficient documentation

## 2022-04-23 DIAGNOSIS — Z720 Tobacco use: Secondary | ICD-10-CM

## 2022-04-23 DIAGNOSIS — R339 Retention of urine, unspecified: Secondary | ICD-10-CM

## 2022-04-23 HISTORY — DX: Anemia in neoplastic disease: D63.0

## 2022-04-23 HISTORY — DX: Secondary malignant neoplasm of bone: C61

## 2022-04-23 HISTORY — DX: Retention of urine, unspecified: R33.9

## 2022-04-23 HISTORY — DX: Personal history of malignant neoplasm of other male genital organs: Z85.49

## 2022-04-23 HISTORY — DX: Essential (primary) hypertension: I10

## 2022-04-23 LAB — CBC WITH DIFFERENTIAL (CANCER CENTER ONLY)
Abs Immature Granulocytes: 0.02 10*3/uL (ref 0.00–0.07)
Basophils Absolute: 0.1 10*3/uL (ref 0.0–0.1)
Basophils Relative: 1 %
Eosinophils Absolute: 0.2 10*3/uL (ref 0.0–0.5)
Eosinophils Relative: 3 %
HCT: 31 % — ABNORMAL LOW (ref 39.0–52.0)
Hemoglobin: 11 g/dL — ABNORMAL LOW (ref 13.0–17.0)
Immature Granulocytes: 0 %
Lymphocytes Relative: 26 %
Lymphs Abs: 1.9 10*3/uL (ref 0.7–4.0)
MCH: 41 pg — ABNORMAL HIGH (ref 26.0–34.0)
MCHC: 35.5 g/dL (ref 30.0–36.0)
MCV: 115.7 fL — ABNORMAL HIGH (ref 80.0–100.0)
Monocytes Absolute: 0.6 10*3/uL (ref 0.1–1.0)
Monocytes Relative: 8 %
Neutro Abs: 4.6 10*3/uL (ref 1.7–7.7)
Neutrophils Relative %: 62 %
Platelet Count: 231 10*3/uL (ref 150–400)
RBC: 2.68 MIL/uL — ABNORMAL LOW (ref 4.22–5.81)
RDW: 12.2 % (ref 11.5–15.5)
WBC Count: 7.4 10*3/uL (ref 4.0–10.5)
nRBC: 0 % (ref 0.0–0.2)

## 2022-04-23 LAB — RETICULOCYTES
Immature Retic Fract: 23.3 % — ABNORMAL HIGH (ref 2.3–15.9)
RBC.: 2.68 MIL/uL — ABNORMAL LOW (ref 4.22–5.81)
Retic Count, Absolute: 60 10*3/uL (ref 19.0–186.0)
Retic Ct Pct: 2.2 % (ref 0.4–3.1)

## 2022-04-23 LAB — CMP (CANCER CENTER ONLY)
ALT: 13 U/L (ref 0–44)
AST: 21 U/L (ref 15–41)
Albumin: 4.8 g/dL (ref 3.5–5.0)
Alkaline Phosphatase: 37 U/L — ABNORMAL LOW (ref 38–126)
Anion gap: 7 (ref 5–15)
BUN: 7 mg/dL — ABNORMAL LOW (ref 8–23)
CO2: 26 mmol/L (ref 22–32)
Calcium: 10.3 mg/dL (ref 8.9–10.3)
Chloride: 100 mmol/L (ref 98–111)
Creatinine: 0.8 mg/dL (ref 0.61–1.24)
GFR, Estimated: >60 mL/min (ref >60)
Glucose, Bld: 106 mg/dL — ABNORMAL HIGH (ref 70–99)
Potassium: 4.2 mmol/L (ref 3.5–5.1)
Sodium: 133 mmol/L — ABNORMAL LOW (ref 135–145)
Total Bilirubin: 0.4 mg/dL (ref 0.3–1.2)
Total Protein: 7.4 g/dL (ref 6.5–8.1)

## 2022-04-23 LAB — IRON AND IRON BINDING CAPACITY (CC-WL,HP ONLY)
Iron: 100 ug/dL (ref 45–182)
Saturation Ratios: 34 % (ref 17.9–39.5)
TIBC: 295 ug/dL (ref 250–450)
UIBC: 195 ug/dL (ref 117–376)

## 2022-04-23 LAB — FERRITIN: Ferritin: 337 ng/mL — ABNORMAL HIGH (ref 24–336)

## 2022-04-23 LAB — TSH: TSH: 4.731 u[IU]/mL — ABNORMAL HIGH (ref 0.350–4.500)

## 2022-04-23 LAB — SEDIMENTATION RATE: Sed Rate: 20 mm/hr — ABNORMAL HIGH (ref 0–16)

## 2022-04-23 LAB — VITAMIN B12: Vitamin B-12: 95 pg/mL — ABNORMAL LOW (ref 180–914)

## 2022-04-23 NOTE — Progress Notes (Signed)
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 The patient has been taking oral iron supplement for a while I do not see any iron studies being performed Oral iron supplement will only be helpful if he has iron deficiency from other causes I will order additional work-up and will call him with test results  The patient is not interested to quit drinking and will not likely to stop treatment for his metastatic prostate cancer and has declined colonoscopy At this point in time, there is no need for him to return   Prostate cancer metastatic to bone [facility]) I would defer to urologist for management  Tobacco abuse He is not interested to quit smoking  ETOH abuse He is not interested to quit drinking  Orders Placed This Encounter  Procedures   Vitamin B12    Standing Status:   Future    Number of Occurrences:   1    Standing Expiration Date:   04/24/2023   Ferritin    Standing Status:   Future    Number of Occurrences:   1    Standing Expiration Date:   04/23/2023   Iron and Iron Binding Capacity ([facility] only)    Standing Status:   Future    Number of Occurrences:   1    Standing Expiration Date:   04/24/2023   TSH    Standing Status:   Future    Number of Occurrences:   1    Standing Expiration Date:   04/24/2023   CBC with Differential ([place] Only)    Standing Status:   Future    Number of Occurrences:   1    Standing Expiration Date:   04/24/2023   CMP ([place] only)    Standing Status:   Future    Number of Occurrences:   1    Standing Expiration Date:   04/24/2023   Reticulocytes    Standing Status:   Future    Number  of Occurrences:   1    Standing Expiration Date:   04/24/2023   Sedimentation rate    Standing Status:   Future    Number of Occurrences:   1    Standing Expiration Date:   04/24/2023    All questions were answered. The patient knows to call the clinic with any problems, questions or concerns.  The total time spent in the appointment was 60 minutes encounter with patients including review of chart and various tests results, discussions about plan of care and coordination of care plan  Heath Lark, MD 11/20/20239:59 AM   CHIEF COMPLAINTS/PURPOSE OF CONSULTATION:  Anemia  HISTORY OF PRESENTING ILLNESS:  David Townsend 61 y.o. male is here because of anemia  He was found to have abnormal CBC from recent blood work He complains of fatigue  His baseline blood count in 2017 was high On 10/29/2015, his white count was 10.5, hemoglobin 16 and MCV was 103.9 On November 30, 2021, his white blood cell count is 8.7, hemoglobin 11.2, MCV of 110 and platelet count of 232  I have obtained records from [facility] I have reviewed his electronic records extensively The patient was diagnosed with  metastatic prostate cancer earlier this year with diffuse bone metastasis.  His baseline PSA was 403.  He was treated with Mills Koller, eventually with Eligard and apalutamide He appears to be responding well to treatment.  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 He had not noticed any recent bleeding such as epistaxis, hematuria or hematochezia The patient denies over the counter NSAID ingestion. He is not on antiplatelets agents. He has never have a screening colonoscopy He denies any pica and eats a variety of diet. He never donated blood or received blood transfusion The  patient was prescribed oral iron supplements and he takes 1 tablet daily The patient have significant tobacco use for over 40 years and is not interested to quit smoking He also have extensive alcohol intake, a mixture of beer and other liquor for the past 40 years and is also not interested to quit drinking  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   Hyperlipidemia 03/27/2022   Penile lesion    Prostate cancer metastatic to bone ([place]) 04/23/2022   Urinary retention 04/23/2022    SURGICAL HISTORY: Past Surgical History:  Procedure Laterality Date   NO PAST SURGERIES     PENILE BIOPSY N/A 01/12/2020   Procedure: EXCISION OF PENILE LESION;  Surgeon: Robley Fries, MD;  Location: [place];  Service: Urology;  Laterality: N/A;    SOCIAL HISTORY: Social History   Socioeconomic History   Marital status: Single    Spouse name: n/a   Number of children: 0   Years of education: 12+   Highest education level: Not on file  Occupational History   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   Lives alone.   Golf Pro   Social Determinants of Health   Financial Resource Strain: Not on file  Food Insecurity: Not on file  Transportation Needs: Not on file  Physical Activity: Not on file  Stress: Not on file  Social  Connections: Not on file  Intimate Partner Violence: Not on file    FAMILY HISTORY: Family History  Family history unknown: Yes    ALLERGIES:  is allergic to penicillins.  MEDICATIONS:  Current Outpatient Medications  Medication Sig Dispense Refill   amLODipine (NORVASC) 5 MG tablet Take 1 tablet (5 mg total) by mouth daily. 30 tablet 2   atorvastatin (LIPITOR) 20 MG tablet Take 1 tablet (20 mg total) by mouth daily. 30 tablet 2   ERLEADA 60 MG tablet Take 240 mg by mouth  daily.     olmesartan (BENICAR) 40 MG tablet Take 1 tablet (40 mg total) by mouth daily. 30 tablet 2   No current facility-administered medications for this visit.    REVIEW OF SYSTEMS:   Constitutional: Denies fevers, chills or abnormal night sweats Eyes: Denies blurriness of vision, double vision or watery eyes Ears, nose, mouth, throat, and face: Denies mucositis or sore throat Respiratory: Denies cough, dyspnea or wheezes Cardiovascular: Denies palpitation, chest discomfort or lower extremity swelling Gastrointestinal:  Denies nausea, heartburn or change in bowel habits Skin: Denies abnormal skin rashes Lymphatics: Denies new lymphadenopathy or easy bruising Neurological:Denies numbness, tingling or new weaknesses Behavioral/Psych: Mood is stable, no new changes  All other systems were reviewed with the patient and are negative.  PHYSICAL EXAMINATION: ECOG PERFORMANCE STATUS: 1 - Symptomatic but completely ambulatory  There were no vitals filed for this visit. There were no vitals filed for this visit.  GENERAL:alert, no distress and comfortable SKIN: skin color, texture, turgor are normal, no rashes or significant lesions EYES: normal, conjunctiva are pink and non-injected, sclera clear OROPHARYNX:no exudate, no erythema and lips, buccal mucosa, and tongue normal  NECK: supple, thyroid normal size, non-tender, without nodularity LYMPH:  no palpable lymphadenopathy in the cervical, axillary or  inguinal LUNGS: clear to auscultation and percussion with normal breathing effort HEART: regular rate & rhythm and no murmurs and no lower extremity edema ABDOMEN:abdomen soft, non-tender and normal bowel sounds Musculoskeletal:no cyanosis of digits and no clubbing  PSYCH: alert & oriented x 3 with fluent speech NEURO: no focal motor/sensory deficits

## 2022-04-23 NOTE — Assessment & Plan Note (Signed)
He is not interested to quit drinking

## 2022-04-23 NOTE — Assessment & Plan Note (Signed)
I would defer to urologist for management

## 2022-04-23 NOTE — Assessment & Plan Note (Addendum)
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

## 2022-04-23 NOTE — Assessment & Plan Note (Signed)
He is not interested to quit smoking

## 2022-04-24 ENCOUNTER — Telehealth: Payer: Self-pay | Admitting: Hematology and Oncology

## 2022-04-24 NOTE — Telephone Encounter (Signed)
I reviewed test results with the patient over the telephone His iron studies are normal/ferritin is high I instructed him to stop taking iron supplement  His B12 level is low I recommend vitamin B12 injection monthly for 6 months and then recheck The patient desired to get treated by his primary care doctor because his house is closer to her office I will send a message to inform her primary care doctor to arrange this He does not need long-term follow-up with me

## 2022-04-25 ENCOUNTER — Other Ambulatory Visit (INDEPENDENT_AMBULATORY_CARE_PROVIDER_SITE_OTHER): Payer: 59

## 2022-04-25 DIAGNOSIS — E785 Hyperlipidemia, unspecified: Secondary | ICD-10-CM

## 2022-04-25 DIAGNOSIS — E538 Deficiency of other specified B group vitamins: Secondary | ICD-10-CM

## 2022-04-25 MED ORDER — CYANOCOBALAMIN 1000 MCG/ML IJ SOLN
1000.0000 ug | Freq: Once | INTRAMUSCULAR | Status: AC
  Administered 2022-04-25: 1000 ug via INTRAMUSCULAR

## 2022-04-25 NOTE — Progress Notes (Signed)
Vit B12 administered right deltoid

## 2022-04-26 LAB — LIPID PANEL
Chol/HDL Ratio: 3.5 ratio (ref 0.0–5.0)
Cholesterol, Total: 222 mg/dL — ABNORMAL HIGH (ref 100–199)
HDL: 64 mg/dL (ref >39)
LDL Chol Calc (NIH): 127 mg/dL — ABNORMAL HIGH (ref 0–99)
Triglycerides: 178 mg/dL — ABNORMAL HIGH (ref 0–149)
VLDL Cholesterol Cal: 31 mg/dL (ref 5–40)

## 2022-05-10 ENCOUNTER — Encounter: Payer: Self-pay | Admitting: Family

## 2022-05-25 ENCOUNTER — Ambulatory Visit (INDEPENDENT_AMBULATORY_CARE_PROVIDER_SITE_OTHER): Payer: 59

## 2022-05-25 DIAGNOSIS — E538 Deficiency of other specified B group vitamins: Secondary | ICD-10-CM

## 2022-05-25 MED ORDER — CYANOCOBALAMIN 1000 MCG/ML IJ SOLN
1000.0000 ug | Freq: Once | INTRAMUSCULAR | Status: AC
  Administered 2022-05-25: 1000 ug via INTRAMUSCULAR

## 2022-05-25 NOTE — Progress Notes (Signed)
Patient came  in for B12 injection to RD. No complications

## 2022-06-01 ENCOUNTER — Telehealth: Payer: Self-pay | Admitting: Family

## 2022-06-01 ENCOUNTER — Other Ambulatory Visit: Payer: Self-pay

## 2022-06-01 DIAGNOSIS — E785 Hyperlipidemia, unspecified: Secondary | ICD-10-CM

## 2022-06-01 MED ORDER — ATORVASTATIN CALCIUM 40 MG PO TABS: 40.0000 mg | ORAL_TABLET | Freq: Every day | ORAL | 3 refills | Status: DC

## 2022-06-01 NOTE — Telephone Encounter (Signed)
Atorvastatin 40 mg sent into pharmacy

## 2022-06-01 NOTE — Telephone Encounter (Signed)
Pt is calling to report that Amy did increase the atorvastatin (LIPITOR) 20 MG tablet [[id number]]  to 2 per day. Pt will run out of medication on Monday.   Preferred [phone]

## 2022-06-20 ENCOUNTER — Other Ambulatory Visit: Payer: Self-pay | Admitting: Family

## 2022-06-20 DIAGNOSIS — I10 Essential (primary) hypertension: Secondary | ICD-10-CM

## 2022-06-20 NOTE — Telephone Encounter (Signed)
Requested medication (s) are due for refill today: yes  Requested medication (s) are on the active medication list: yes  Last refill:  both last refilled 03/26/22 #30 2 RF  Future visit scheduled: yes  Notes to clinic:  both prescriptions end date is 06/24/22   Requested Prescriptions  Pending Prescriptions Disp Refills   olmesartan (BENICAR) 40 MG tablet [Pharmacy Med Name: OLMESARTAN MEDOXOMIL '40MG'$  TABLETS] 30 tablet 2    Sig: TAKE 1 TABLET(40 MG) BY MOUTH DAILY     Cardiovascular:  Angiotensin Receptor Blockers Failed - 06/20/2022 10:06 AM      Failed - Last BP in normal range    BP Readings from Last 1 Encounters:  04/23/22 (!) 146/69         Passed - Cr in normal range and within 180 days    Creatinine  Date Value Ref Range Status  04/23/2022 0.80 0.61 - 1.24 mg/dL Final   Creat  Date Value Ref Range Status  10/29/2015 1.24 0.70 - 1.33 mg/dL Final         Passed - K in normal range and within 180 days    Potassium  Date Value Ref Range Status  04/23/2022 4.2 3.5 - 5.1 mmol/L Final         Passed - Patient is not pregnant      Passed - Valid encounter within last 6 months    Recent Outpatient Visits           2 months ago Primary hypertension   [facility] at [facility], Amy J, NP   6 months ago Annual physical exam   Primary Care at [facility], Amy J, NP   7 months ago Encounter to establish care   Primary Care at [facility], Amy J, NP   6 years ago Infected sebaceous cyst   Primary Care at [place], PA-C   6 years ago Infected sebaceous cyst   Primary Care at [facility], [place], [place]       Future Appointments             In 1 month Camillia Herter, NP Primary Care at [facility]   In 5 months Camillia Herter, NP Primary Care at [facility]             amLODipine ([place]) 5 MG tablet [Pharmacy Med Name: AMLODIPINE BESYLATE '5MG'$  TABLETS] 30 tablet 2    Sig: TAKE 1 TABLET(5 MG) BY MOUTH  DAILY     Cardiovascular: Calcium Channel Blockers 2 Failed - 06/20/2022 10:06 AM      Failed - Last BP in normal range    BP Readings from Last 1 Encounters:  04/23/22 (!) 146/69         Passed - Last Heart Rate in normal range    Pulse Readings from Last 1 Encounters:  04/23/22 88         Passed - Valid encounter within last 6 months    Recent Outpatient Visits           2 months ago Primary hypertension   Primary Care at [facility], Amy J, NP   6 months ago Annual physical exam   Primary Care at [facility], Amy J, NP   7 months ago Encounter to establish care   Primary Care at [facility], Amy J, NP   6 years ago Infected sebaceous cyst   [facility] at [facility], Judson Roch  L, PA-C   6 years ago Infected sebaceous cyst   Primary Care at [facility], [place], [place]       Future Appointments             In 1 month Camillia Herter, NP Primary Care at [facility]   In 5 months Camillia Herter, NP Primary Care at [facility]

## 2022-06-25 ENCOUNTER — Ambulatory Visit (INDEPENDENT_AMBULATORY_CARE_PROVIDER_SITE_OTHER): Payer: 59

## 2022-06-25 ENCOUNTER — Ambulatory Visit: Payer: 59

## 2022-06-25 DIAGNOSIS — E538 Deficiency of other specified B group vitamins: Secondary | ICD-10-CM

## 2022-06-25 MED ORDER — CYANOCOBALAMIN 1000 MCG/ML IJ SOLN
1000.0000 ug | Freq: Once | INTRAMUSCULAR | Status: AC
  Administered 2022-06-25: 1000 ug via INTRAMUSCULAR

## 2022-06-25 NOTE — Progress Notes (Signed)
Administered in right deltoid

## 2022-07-20 NOTE — Progress Notes (Unsigned)
Patient ID: David Townsend, male    DOB: 01-25-1961  MRN: [id number]  CC: Chronic Care Management  Subjective: David Townsend is a 62 y.o. male who presents for chronic care management.   His concerns today include:  HTN - Olmesartan, Amlodipine HLD - Atorvastatin    Patient Active Problem List   Diagnosis Date Noted   Prostate cancer metastatic to bone ([place]) 04/23/2022   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   Tobacco abuse 04/23/2022   Essential hypertension 04/23/2022   Urinary retention 04/23/2022   Hyperlipidemia 03/27/2022   ETOH abuse 11/01/2015     Current Outpatient Medications on File Prior to Visit  Medication Sig Dispense Refill   atorvastatin (LIPITOR) 40 MG tablet Take 1 tablet (40 mg total) by mouth daily. 90 tablet 3   amLODipine (NORVASC) 5 MG tablet TAKE 1 TABLET(5 MG) BY MOUTH DAILY 30 tablet 2   ERLEADA 60 MG tablet Take 240 mg by mouth daily.     olmesartan (BENICAR) 40 MG tablet TAKE 1 TABLET(40 MG) BY MOUTH DAILY 30 tablet 2   No current facility-administered medications on file prior to visit.    Allergies  Allergen Reactions   Penicillins     [place]  Has patient had a PCN reaction causing immediate rash, facial/tongue/throat swelling, SOB or lightheadedness with hypotension: Y Has patient had a PCN reaction causing severe rash involving mucus membranes or skin necrosis: Y Has patient had a PCN reaction that required hospitalization: N Has patient had a PCN reaction occurring within the last 10 years: N If all of the above answers are "NO", then may proceed with Cephalosporin use.     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   Lives alone.   Golf [employer]   Social Determinants of Health   Financial Resource Strain: Not on file  Food Insecurity: Not on file  Transportation Needs: Not on file  Physical Activity: Not on file  Stress: Not on file  Social Connections: Not on file  Intimate Partner Violence: Not on file    Family History  Family history unknown: Yes    Past Surgical History:  Procedure Laterality Date   NO PAST SURGERIES     PENILE BIOPSY N/A 01/12/2020   Procedure: EXCISION OF PENILE LESION;  Surgeon: Robley Fries, MD;  Location: [place];  Service: Urology;  Laterality: N/A;    ROS: Review of Systems Negative except as stated above  PHYSICAL EXAM: There were no vitals taken for this visit.  Physical Exam  {male adult master:[id number]} {male adult master:[id number]}     Latest Ref Rng & Units 04/23/2022    9:59 AM 11/30/2021   11:32 AM 11/10/2021   10:31 AM  CMP  Glucose 70 - 99 mg/dL 106  113  112   BUN 8 - 23 mg/dL 7  19  11   $ Creatinine 0.61 - 1.24 mg/dL 0.80  1.13  0.94   Sodium 135 - 145 mmol/L 133  130  140   Potassium 3.5 - 5.1 mmol/L 4.2  5.1  5.7   Chloride 98 - 111 mmol/L 100  92  101   CO2 22 - 32 mmol/L 26  20  21   $ Calcium 8.9 - 10.3 mg/dL 10.3  10.2  10.9   Total Protein 6.5 - 8.1 g/dL 7.4  7.3    Total Bilirubin 0.3 - 1.2 mg/dL 0.4  0.8    Alkaline Phos 38 - 126 U/L 37  67    AST 15 - 41 U/L 21  24    ALT 0 - 44 U/L 13  22     Lipid Panel     Component Value Date/Time   CHOL 222 (H) 04/25/2022  0932   TRIG 178 (H) 04/25/2022 0932   HDL 64 04/25/2022 0932   CHOLHDL 3.5 04/25/2022 0932   CHOLHDL 2.5 10/29/2015 1339   VLDL 25 10/29/2015 1339   LDLCALC 127 (H) 04/25/2022 0932    CBC    Component Value Date/Time   WBC 7.4 04/23/2022 0959   WBC 8.7 08/12/2019 1331   RBC 2.68 (L) 04/23/2022 1000   RBC 2.68 (L) 04/23/2022 0959   HGB 11.0 (L) 04/23/2022 0959   HGB 11.2 (L) 11/30/2021 1132   HCT 31.0 (L) 04/23/2022 0959   HCT 31.4 (L) 11/30/2021 1132   PLT 231 04/23/2022 0959   PLT 232 11/30/2021 1132   MCV 115.7 (H) 04/23/2022 0959   MCV 110 (H) 11/30/2021 1132   MCH 41.0 (H) 04/23/2022 0959   MCHC 35.5 04/23/2022 0959   RDW 12.2 04/23/2022 0959   RDW 12.9 11/30/2021 1132   LYMPHSABS 1.9 04/23/2022 0959   MONOABS 0.6 04/23/2022 0959   EOSABS 0.2 04/23/2022 0959   BASOSABS 0.1 04/23/2022 0959    ASSESSMENT AND PLAN:  There are no diagnoses linked to this encounter.   Patient was given the opportunity to ask questions.  Patient verbalized understanding of the plan and was able to repeat key elements of the plan. Patient was given clear instructions to go to Emergency Department or return to medical center if symptoms don't improve, worsen, or new problems develop.The patient verbalized understanding.   No orders of the defined types were placed in this encounter.    Requested Prescriptions    No prescriptions requested or ordered in this encounter    No follow-ups on file.  Camillia Herter, NP

## 2022-07-24 ENCOUNTER — Ambulatory Visit (INDEPENDENT_AMBULATORY_CARE_PROVIDER_SITE_OTHER): Payer: 59 | Admitting: Family

## 2022-07-24 VITALS — BP 143/77 | HR 84 | Temp 98.3°F | Resp 16 | Ht 70.0 in | Wt 164.0 lb

## 2022-07-24 DIAGNOSIS — E785 Hyperlipidemia, unspecified: Secondary | ICD-10-CM

## 2022-07-24 DIAGNOSIS — I10 Essential (primary) hypertension: Secondary | ICD-10-CM

## 2022-07-24 DIAGNOSIS — E538 Deficiency of other specified B group vitamins: Secondary | ICD-10-CM | POA: Diagnosis not present

## 2022-07-24 MED ORDER — OLMESARTAN MEDOXOMIL 40 MG PO TABS: ORAL_TABLET | ORAL | 2 refills | Status: DC

## 2022-07-24 MED ORDER — CYANOCOBALAMIN 1000 MCG/ML IJ SOLN
1000.0000 ug | Freq: Once | INTRAMUSCULAR | Status: AC
  Administered 2022-07-24: 1000 ug via INTRAMUSCULAR

## 2022-07-24 MED ORDER — AMLODIPINE BESYLATE 10 MG PO TABS: 10.0000 mg | ORAL_TABLET | Freq: Every day | ORAL | 2 refills | Status: DC

## 2022-07-24 MED ORDER — ATORVASTATIN CALCIUM 40 MG PO TABS: 40.0000 mg | ORAL_TABLET | Freq: Every day | ORAL | 2 refills | Status: DC

## 2022-07-24 NOTE — Progress Notes (Signed)
.  Pt presents for chronic care management   -cyanocobalamin administered right deltoid

## 2022-07-25 LAB — LIPID PANEL
Chol/HDL Ratio: 2.5 ratio (ref 0.0–5.0)
Cholesterol, Total: 157 mg/dL (ref 100–199)
HDL: 62 mg/dL (ref >39)
LDL Chol Calc (NIH): 74 mg/dL (ref 0–99)
Triglycerides: 117 mg/dL (ref 0–149)
VLDL Cholesterol Cal: 21 mg/dL (ref 5–40)

## 2022-07-26 ENCOUNTER — Other Ambulatory Visit: Payer: Self-pay | Admitting: Family

## 2022-07-26 DIAGNOSIS — I10 Essential (primary) hypertension: Secondary | ICD-10-CM

## 2022-07-26 NOTE — Telephone Encounter (Signed)
Copied from [place] [id number]. Topic: General - Other >> Jul 26, 2022  8:44 AM Everette C wrote: Reason for CRM: Medication Refill - Medication: olmesartan (BENICAR) 40 MG tablet [id number]  Has the patient contacted their pharmacy? No. (Agent: If no, request that the patient contact the pharmacy for the refill. If patient does not wish to contact the pharmacy document the reason why and proceed with request.) (Agent: If yes, when and what did the pharmacy advise?)   Preferred Pharmacy (with phone number or street name): [facility] [place], [place] [address] [address] Phone: [phone] Fax: [phone] Hours: Not open 24 hours   Has the patient been seen for an appointment in the last year OR does the patient have an upcoming appointment? Yes.    Agent: Please be advised that RX refills may take up to 3 business days. We ask that you follow-up with your pharmacy.

## 2022-07-26 NOTE — Telephone Encounter (Signed)
Refilled 07/24/2022 #30 2 rf - same pharmacy. Requested Prescriptions  Pending Prescriptions Disp Refills   olmesartan (BENICAR) 40 MG tablet 30 tablet 2    Sig: TAKE 1 TABLET(40 MG) BY MOUTH DAILY     Cardiovascular:  Angiotensin Receptor Blockers Failed - 07/26/2022 10:10 AM      Failed - Last BP in normal range    BP Readings from Last 1 Encounters:  07/24/22 (!) 143/77         Passed - Cr in normal range and within 180 days    Creatinine  Date Value Ref Range Status  04/23/2022 0.80 0.61 - 1.24 mg/dL Final   Creat  Date Value Ref Range Status  10/29/2015 1.24 0.70 - 1.33 mg/dL Final         Passed - K in normal range and within 180 days    Potassium  Date Value Ref Range Status  04/23/2022 4.2 3.5 - 5.1 mmol/L Final         Passed - Patient is not pregnant      Passed - Valid encounter within last 6 months    Recent Outpatient Visits           2 days ago Primary hypertension   [place] [facility] at [facility], [place], NP   4 months ago Primary hypertension   [place] [facility] at [facility], [place], NP   7 months ago Annual physical exam   [place] [facility] at [facility], [place], NP   8 months ago Encounter to establish care   [facility] at [facility], [place], NP   6 years ago Infected sebaceous cyst   Primary Care at [place], PA-C       Future Appointments             In 3 weeks Camillia Herter, NP [place] at [facility]   In 4 months Camillia Herter, NP [place] at [facility]

## 2022-07-31 ENCOUNTER — Telehealth: Payer: Self-pay

## 2022-07-31 NOTE — Telephone Encounter (Signed)
Patient called in was told he can pick up his med today at 1, so he will do so.

## 2022-07-31 NOTE — Telephone Encounter (Signed)
Att to contact pt to verify if he has went to pharmacy to pick up medication as it was sent in on 07/24/22 and confirmation received from pharmacy.  olmesartan (BENICAR) 40 MG tablet 30 tablet 2 07/24/2022    Sig: TAKE 1 TABLET(40 MG) BY MOUTH DAILY   Sent to pharmacy as: olmesartan (BENICAR) 40 MG tablet   E-Prescribing Status: Receipt confirmed by pharmacy (07/24/2022 10:06 AM EST)

## 2022-08-20 NOTE — Progress Notes (Unsigned)
Patient ID: David Townsend, male    DOB: 02/15/1961  MRN: [id number]  CC: Blood Pressure Check  Subjective: David Townsend is a 62 y.o. male who presents for blood pressure check.   His concerns today include:  Taking blood pressure medications, no issues/concerns. He denies red flag symptoms such as but not limited to chest pain, shortness of breath, worst headache of life, nausea/vomiting. Reports his blood pressure monitor at home is not accurate. No further issues/concerns for discussion today.    Patient Active Problem List   Diagnosis Date Noted   Prostate cancer metastatic to bone ([place]) 04/23/2022   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   Tobacco abuse 04/23/2022   Essential hypertension 04/23/2022   Urinary retention 04/23/2022   Hyperlipidemia 03/27/2022   ETOH abuse 11/01/2015     Current Outpatient Medications on File Prior to Visit  Medication Sig Dispense Refill   amLODipine (NORVASC) 10 MG tablet Take 1 tablet (10 mg total) by mouth daily. 30 tablet 2   atorvastatin (LIPITOR) 40 MG tablet Take 1 tablet (40 mg total) by mouth daily. 30 tablet 2   ERLEADA 60 MG tablet Take 240 mg by mouth daily.     olmesartan (BENICAR) 40 MG tablet TAKE 1 TABLET(40 MG) BY MOUTH DAILY 30 tablet 2   No current facility-administered medications on file prior to visit.    Allergies  Allergen Reactions   Penicillins     [place]  Has patient had a PCN reaction causing immediate rash, facial/tongue/throat swelling, SOB or lightheadedness with hypotension: Y Has patient had a PCN reaction causing severe rash involving mucus membranes or skin necrosis: Y Has patient had a PCN reaction that required hospitalization: N Has patient had a PCN reaction occurring within the last 10 years: N If all of the above answers are "NO", then may proceed with Cephalosporin use.     Social History   Socioeconomic History   Marital status: Single    Spouse  name: n/a   Number of children: 0   Years of education: 12+   Highest education level: Not on file  Occupational History   Occupation: golf guy at [employer] Use   Smoking status: Every Day    Packs/day: 2.00    Years: 34.00    Additional pack years: 0.00    Total pack years: 68.00    Types: Cigarettes    Passive exposure: Current   Smokeless tobacco: Never  Vaping Use   Vaping Use: Never used  Substance and Sexual Activity   Alcohol use: Yes    Alcohol/week: 0.0 standard drinks of alcohol    Comment: 5-6 beers per day   Drug use: No   Sexual activity: Not on file  Other Topics Concern   Not on file  Social History Narrative   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   Lives alone.   Golf [employer]   Social Determinants of Health   Financial Resource Strain: Not on file  Food Insecurity: Not on file  Transportation Needs: Not on file  Physical Activity: Not on file  Stress: Not on file  Social Connections: Not on file  Intimate Partner Violence: Not on file    Family History  Family history unknown: Yes    Past Surgical History:  Procedure Laterality Date   NO PAST SURGERIES     PENILE BIOPSY N/A 01/12/2020   Procedure: EXCISION  OF PENILE LESION;  Surgeon: Robley Fries, MD;  Location: [facility];  Service: Urology;  Laterality: N/A;    ROS: Review of Systems Negative except as stated above  PHYSICAL EXAM: BP (!) 150/87   Pulse 98   Resp 18   Ht 5\' 9"  (1.753 m)   Wt 160 lb (72.6 kg)   SpO2 100%   BMI 23.63 kg/m   Physical Exam HENT:     Head: Normocephalic and atraumatic.  Eyes:     Extraocular Movements: Extraocular movements intact.     Conjunctiva/sclera: Conjunctivae normal.     Pupils: Pupils are equal, round, and reactive to light.  Cardiovascular:     Rate and Rhythm: Normal rate and regular rhythm.     Pulses: Normal pulses.     Heart sounds: Normal heart sounds.  Pulmonary:      Effort: Pulmonary effort is normal.     Breath sounds: Normal breath sounds.  Musculoskeletal:     Cervical back: Normal range of motion and neck supple.  Neurological:     General: No focal deficit present.     Mental Status: He is alert and oriented to person, place, and time.  Psychiatric:        Mood and Affect: Mood normal.        Behavior: Behavior normal.     ASSESSMENT AND PLAN: 1. Primary hypertension - Blood pressure not at goal during today's visit. Patient asymptomatic without chest pressure, chest pain, palpitations, shortness of breath, worst headache of life, and any additional red flag symptoms. - Continue Amlodipine and Olmesartan as prescribed. No refills needed as of present.  - Begin low dose Spironolactone as prescribed. Counseled on medication adherence/adverse effects.  - Counseled on blood pressure goal of less than 130/80, low-sodium, DASH diet, medication compliance, and 150 minutes of moderate intensity exercise per week as tolerated. Counseled on medication adherence and adverse effects. - Follow-up with primary provider in 2 weeks or sooner if needed for blood pressure check.  - spironolactone (ALDACTONE) 25 MG tablet; Take 0.5 tablets (12.5 mg total) by mouth daily.  Dispense: 30 tablet; Refill: 0 - Basic Metabolic Panel  2. B12 deficiency - Administered.  - cyanocobalamin (VITAMIN B12) injection 1,000 mcg   Patient was given the opportunity to ask questions.  Patient verbalized understanding of the plan and was able to repeat key elements of the plan. Patient was given clear instructions to go to Emergency Department or return to medical center if symptoms don't improve, worsen, or new problems develop.The patient verbalized understanding.   Orders Placed This Encounter  Procedures   Basic Metabolic Panel     Requested Prescriptions   Signed Prescriptions Disp Refills   spironolactone (ALDACTONE) 25 MG tablet 30 tablet 0    Sig: Take 0.5 tablets  (12.5 mg total) by mouth daily.    Return in about 2 weeks (around 09/05/2022) for Follow-Up or next available bp check .  Camillia Herter, NP

## 2022-08-22 ENCOUNTER — Encounter: Payer: Self-pay | Admitting: Family

## 2022-08-22 ENCOUNTER — Ambulatory Visit (INDEPENDENT_AMBULATORY_CARE_PROVIDER_SITE_OTHER): Payer: 59 | Admitting: Family

## 2022-08-22 VITALS — BP 150/87 | HR 98 | Resp 18 | Ht 69.0 in | Wt 160.0 lb

## 2022-08-22 DIAGNOSIS — I10 Essential (primary) hypertension: Secondary | ICD-10-CM

## 2022-08-22 DIAGNOSIS — F17210 Nicotine dependence, cigarettes, uncomplicated: Secondary | ICD-10-CM

## 2022-08-22 DIAGNOSIS — E538 Deficiency of other specified B group vitamins: Secondary | ICD-10-CM | POA: Diagnosis not present

## 2022-08-22 MED ORDER — CYANOCOBALAMIN 1000 MCG/ML IJ SOLN
1000.0000 ug | Freq: Once | INTRAMUSCULAR | Status: AC
  Administered 2022-08-22: 1000 ug via INTRAMUSCULAR

## 2022-08-22 MED ORDER — SPIRONOLACTONE 25 MG PO TABS: 12.5000 mg | ORAL_TABLET | Freq: Every day | ORAL | 0 refills | Status: DC

## 2022-08-22 NOTE — Progress Notes (Signed)
.  Pt presents for blood pressure check   

## 2022-08-23 ENCOUNTER — Ambulatory Visit: Payer: Self-pay

## 2022-08-23 ENCOUNTER — Telehealth: Payer: Self-pay

## 2022-08-23 LAB — BASIC METABOLIC PANEL
BUN/Creatinine Ratio: 13 (ref 10–24)
BUN: 12 mg/dL (ref 8–27)
CO2: 21 mmol/L (ref 20–29)
Calcium: 10.4 mg/dL — ABNORMAL HIGH (ref 8.6–10.2)
Chloride: 102 mmol/L (ref 96–106)
Creatinine, Ser: 0.91 mg/dL (ref 0.76–1.27)
Glucose: 92 mg/dL (ref 70–99)
Potassium: 4.6 mmol/L (ref 3.5–5.2)
Sodium: 142 mmol/L (ref 134–144)
eGFR: 96 mL/min/{1.73_m2} (ref >59)

## 2022-08-23 NOTE — Telephone Encounter (Signed)
  Chief Complaint: Medication question Symptoms: Which HTN meds should he be taking? Frequency:  Pertinent Negatives: Patient denies  Disposition: [] ED /[] Urgent Care (no appt availability in office) / [] Appointment(In office/virtual)/ []  [place] [facility]/ [] Home Care/ [] Refused Recommended Disposition /[] [facility]/ []  Follow-up with PCP Additional Notes: Pt called to clarify which HTN meds he should be taking.  From OV notes:    Continue Amlodipine and Olmesartan as prescribed. No refills needed as of present.  - Begin low dose Spironolactone as prescribed. Counseled on medication adherence/adverse effects.   No further questions   Reason for Disposition  Pharmacy calling with prescription question and triager answers question  Answer Assessment - Initial Assessment Questions 1. NAME of MEDICINE: "What medicine(s) are you calling about?"     What HTN meds should he be taking? 2. QUESTION: "What is your question?" (e.g., double dose of medicine, side effect)      3. PRESCRIBER: "Who prescribed the medicine?" Reason: if prescribed by specialist, call should be referred to that group.     Amy Stephens 4. SYMPTOMS: "Do you have any symptoms?" If Yes, ask: "What symptoms are you having?"  "How bad are the symptoms (e.g., mild, moderate, severe)  Protocols used: Medication Question Call-A-AH

## 2022-08-23 NOTE — Telephone Encounter (Signed)
Att to return pt call about BP med clarification no ans lvm

## 2022-08-23 NOTE — Telephone Encounter (Signed)
Will close out this NT encounter d/t pt has been triaged by another RN today. Please review for reference.

## 2022-08-23 NOTE — Telephone Encounter (Signed)
Pt called in and was speaking with Domnique E, [place] and was transferring to NT but lost connection with pt during transfer. Tried called pt back and LVMTCB to discuss HTN medications.

## 2022-08-23 NOTE — Telephone Encounter (Unsigned)
Copied from [place] [id number]. Topic: General - Inquiry >> Aug 23, 2022  9:32 AM Penni Bombard wrote: Reason for CRM: pt needs clarification on his blood pressure meds.  Please advise

## 2022-08-24 ENCOUNTER — Telehealth: Payer: Self-pay

## 2022-08-24 NOTE — Telephone Encounter (Signed)
Spoke to pt advised that he is to take 1/2 tab of Spironolactone along with other BP meds

## 2022-08-24 NOTE — Telephone Encounter (Unsigned)
Copied from [place] [id number]. Topic: General - Inquiry >> Aug 23, 2022 11:07 AM Erskine Squibb wrote: Reason for CRM: The patient called in returning Lenee Franze's phone call. Unable to reach office through call or teams. Please assist patient further

## 2022-08-24 NOTE — Telephone Encounter (Signed)
Pt contacted.

## 2022-09-06 ENCOUNTER — Other Ambulatory Visit: Payer: Self-pay

## 2022-09-06 ENCOUNTER — Other Ambulatory Visit: Payer: Self-pay | Admitting: Family

## 2022-09-06 DIAGNOSIS — I10 Essential (primary) hypertension: Secondary | ICD-10-CM

## 2022-09-06 MED ORDER — AMLODIPINE BESYLATE 10 MG PO TABS: 10.0000 mg | ORAL_TABLET | Freq: Every day | ORAL | 0 refills | Status: DC

## 2022-09-06 NOTE — Telephone Encounter (Signed)
Unable to refill per protocol, Rx request is too soon, last refill 07/24/22 for 30 days and 2 refills.  Requested Prescriptions  Pending Prescriptions Disp Refills   amLODipine (NORVASC) 10 MG tablet 30 tablet 2    Sig: Take 1 tablet (10 mg total) by mouth daily.     Cardiovascular: Calcium Channel Blockers 2 Failed - 09/06/2022 10:37 AM      Failed - Last BP in normal range    BP Readings from Last 1 Encounters:  08/22/22 (!) 150/87         Passed - Last Heart Rate in normal range    Pulse Readings from Last 1 Encounters:  08/22/22 98         Passed - Valid encounter within last 6 months    Recent Outpatient Visits           2 weeks ago Primary hypertension   [facility], [place], NP   1 month ago Primary hypertension   [facility], Amy J, NP   5 months ago Primary hypertension    [facility], [place], NP   9 months ago Annual physical exam   [facility], [place], NP   10 months ago Encounter to establish care   [facility], Flonnie Hailstone, NP       Future Appointments             In 2 months Camillia Herter, NP [facility]

## 2022-09-06 NOTE — Telephone Encounter (Signed)
Medication Refill - Medication: amLODipine (NORVASC) 10 MG tablet [id number]   Has the patient contacted their pharmacy? Yes.   (Agent: If no, request that the patient contact the pharmacy for the refill. If patient does not wish to contact the pharmacy document the reason why and proceed with request.) (Agent: If yes, when and what did the pharmacy advise?)  Preferred Pharmacy (with phone number or street name): [facility], [place] [address], [place] [address] Phone: [phone]  Fax: [phone]   Has the patient been seen for an appointment in the last year OR does the patient have an upcoming appointment? Yes.    Agent: Please be advised that RX refills may take up to 3 business days. We ask that you follow-up with your pharmacy.

## 2022-09-19 ENCOUNTER — Encounter (HOSPITAL_COMMUNITY): Payer: Self-pay

## 2022-09-19 ENCOUNTER — Inpatient Hospital Stay (HOSPITAL_COMMUNITY)
Admission: EM | Admit: 2022-09-19 | Discharge: 2022-10-04 | DRG: 698 | Disposition: A | Discharge status: Skilled Nursing Facility | Payer: 59 | Attending: Internal Medicine | Admitting: Internal Medicine

## 2022-09-19 ENCOUNTER — Emergency Department (HOSPITAL_COMMUNITY): Payer: 59

## 2022-09-19 ENCOUNTER — Other Ambulatory Visit: Payer: Self-pay

## 2022-09-19 DIAGNOSIS — E871 Hypo-osmolality and hyponatremia: Secondary | ICD-10-CM

## 2022-09-19 DIAGNOSIS — Z79899 Other long term (current) drug therapy: Secondary | ICD-10-CM

## 2022-09-19 DIAGNOSIS — J80 Acute respiratory distress syndrome: Secondary | ICD-10-CM

## 2022-09-19 DIAGNOSIS — J181 Lobar pneumonia, unspecified organism: Secondary | ICD-10-CM

## 2022-09-19 DIAGNOSIS — Z1152 Encounter for screening for COVID-19: Secondary | ICD-10-CM

## 2022-09-19 DIAGNOSIS — E876 Hypokalemia: Secondary | ICD-10-CM | POA: Diagnosis present

## 2022-09-19 DIAGNOSIS — N3001 Acute cystitis with hematuria: Secondary | ICD-10-CM

## 2022-09-19 DIAGNOSIS — E538 Deficiency of other specified B group vitamins: Secondary | ICD-10-CM | POA: Diagnosis present

## 2022-09-19 DIAGNOSIS — F17210 Nicotine dependence, cigarettes, uncomplicated: Secondary | ICD-10-CM | POA: Diagnosis present

## 2022-09-19 DIAGNOSIS — I10 Essential (primary) hypertension: Secondary | ICD-10-CM | POA: Diagnosis present

## 2022-09-19 DIAGNOSIS — Z880 Allergy status to penicillin: Secondary | ICD-10-CM

## 2022-09-19 DIAGNOSIS — Y95 Nosocomial condition: Secondary | ICD-10-CM | POA: Diagnosis not present

## 2022-09-19 DIAGNOSIS — A4151 Sepsis due to Escherichia coli [E. coli]: Secondary | ICD-10-CM | POA: Diagnosis present

## 2022-09-19 DIAGNOSIS — R748 Abnormal levels of other serum enzymes: Secondary | ICD-10-CM

## 2022-09-19 DIAGNOSIS — E860 Dehydration: Secondary | ICD-10-CM | POA: Diagnosis present

## 2022-09-19 DIAGNOSIS — M6282 Rhabdomyolysis: Secondary | ICD-10-CM | POA: Diagnosis present

## 2022-09-19 DIAGNOSIS — T83511A Infection and inflammatory reaction due to indwelling urethral catheter, initial encounter: Secondary | ICD-10-CM | POA: Diagnosis not present

## 2022-09-19 DIAGNOSIS — A419 Sepsis, unspecified organism: Secondary | ICD-10-CM | POA: Insufficient documentation

## 2022-09-19 DIAGNOSIS — Y731 Therapeutic (nonsurgical) and rehabilitative gastroenterology and urology devices associated with adverse incidents: Secondary | ICD-10-CM | POA: Diagnosis present

## 2022-09-19 DIAGNOSIS — W06XXXA Fall from bed, initial encounter: Secondary | ICD-10-CM | POA: Diagnosis present

## 2022-09-19 DIAGNOSIS — I2510 Atherosclerotic heart disease of native coronary artery without angina pectoris: Secondary | ICD-10-CM | POA: Diagnosis present

## 2022-09-19 DIAGNOSIS — F1010 Alcohol abuse, uncomplicated: Secondary | ICD-10-CM | POA: Diagnosis present

## 2022-09-19 DIAGNOSIS — R531 Weakness: Secondary | ICD-10-CM

## 2022-09-19 DIAGNOSIS — E222 Syndrome of inappropriate secretion of antidiuretic hormone: Secondary | ICD-10-CM | POA: Diagnosis present

## 2022-09-19 DIAGNOSIS — A4181 Sepsis due to Enterococcus: Secondary | ICD-10-CM | POA: Diagnosis present

## 2022-09-19 DIAGNOSIS — Z720 Tobacco use: Secondary | ICD-10-CM | POA: Diagnosis present

## 2022-09-19 DIAGNOSIS — E785 Hyperlipidemia, unspecified: Secondary | ICD-10-CM | POA: Diagnosis present

## 2022-09-19 DIAGNOSIS — C7951 Secondary malignant neoplasm of bone: Secondary | ICD-10-CM

## 2022-09-19 DIAGNOSIS — C61 Malignant neoplasm of prostate: Secondary | ICD-10-CM | POA: Diagnosis present

## 2022-09-19 DIAGNOSIS — E874 Mixed disorder of acid-base balance: Secondary | ICD-10-CM | POA: Diagnosis not present

## 2022-09-19 LAB — HEPATIC FUNCTION PANEL
ALT: 36 U/L (ref 0–44)
AST: 148 U/L — ABNORMAL HIGH (ref 15–41)
Albumin: 4.5 g/dL (ref 3.5–5.0)
Alkaline Phosphatase: 54 U/L (ref 38–126)
Bilirubin, Direct: 0.2 mg/dL (ref 0.0–0.2)
Indirect Bilirubin: 0.5 mg/dL (ref 0.3–0.9)
Total Bilirubin: 0.7 mg/dL (ref 0.3–1.2)
Total Protein: 8.3 g/dL — ABNORMAL HIGH (ref 6.5–8.1)

## 2022-09-19 LAB — CBC WITH DIFFERENTIAL/PLATELET
Abs Immature Granulocytes: 0.15 10*3/uL — ABNORMAL HIGH (ref 0.00–0.07)
Basophils Absolute: 0.1 10*3/uL (ref 0.0–0.1)
Basophils Relative: 0 %
Eosinophils Absolute: 0 10*3/uL (ref 0.0–0.5)
Eosinophils Relative: 0 %
HCT: 35.7 % — ABNORMAL LOW (ref 39.0–52.0)
Hemoglobin: 12.8 g/dL — ABNORMAL LOW (ref 13.0–17.0)
Immature Granulocytes: 1 %
Lymphocytes Relative: 4 %
Lymphs Abs: 0.7 10*3/uL (ref 0.7–4.0)
MCH: 40.3 pg — ABNORMAL HIGH (ref 26.0–34.0)
MCHC: 35.9 g/dL (ref 30.0–36.0)
MCV: 112.3 fL — ABNORMAL HIGH (ref 80.0–100.0)
Monocytes Absolute: 0.6 10*3/uL (ref 0.1–1.0)
Monocytes Relative: 4 %
Neutro Abs: 15.1 10*3/uL — ABNORMAL HIGH (ref 1.7–7.7)
Neutrophils Relative %: 91 %
Platelets: 193 10*3/uL (ref 150–400)
RBC: 3.18 MIL/uL — ABNORMAL LOW (ref 4.22–5.81)
RDW: 12.2 % (ref 11.5–15.5)
WBC: 16.6 10*3/uL — ABNORMAL HIGH (ref 4.0–10.5)
nRBC: 0 % (ref 0.0–0.2)

## 2022-09-19 LAB — URINALYSIS, ROUTINE W REFLEX MICROSCOPIC
Bilirubin Urine: NEGATIVE
Glucose, UA: NEGATIVE mg/dL
Ketones, ur: 20 mg/dL — AB
Nitrite: NEGATIVE
Protein, ur: 100 mg/dL — AB
Specific Gravity, Urine: 1.019 (ref 1.005–1.030)
pH: 5 (ref 5.0–8.0)

## 2022-09-19 LAB — RAPID URINE DRUG SCREEN, HOSP PERFORMED
Amphetamines: NOT DETECTED
Barbiturates: NOT DETECTED
Benzodiazepines: NOT DETECTED
Cocaine: NOT DETECTED
Opiates: NOT DETECTED
Tetrahydrocannabinol: NOT DETECTED

## 2022-09-19 LAB — MAGNESIUM: Magnesium: 1.9 mg/dL (ref 1.7–2.4)

## 2022-09-19 LAB — BASIC METABOLIC PANEL
Anion gap: 13 (ref 5–15)
BUN: 16 mg/dL (ref 8–23)
CO2: 23 mmol/L (ref 22–32)
Calcium: 9.7 mg/dL (ref 8.9–10.3)
Chloride: 91 mmol/L — ABNORMAL LOW (ref 98–111)
Creatinine, Ser: 0.99 mg/dL (ref 0.61–1.24)
GFR, Estimated: >60 mL/min (ref >60)
Glucose, Bld: 110 mg/dL — ABNORMAL HIGH (ref 70–99)
Potassium: 3.6 mmol/L (ref 3.5–5.1)
Sodium: 127 mmol/L — ABNORMAL LOW (ref 135–145)

## 2022-09-19 LAB — LACTIC ACID, PLASMA: Lactic Acid, Venous: 0.8 mmol/L (ref 0.5–1.9)

## 2022-09-19 LAB — CK: Total CK: 6277 U/L — ABNORMAL HIGH (ref 49–397)

## 2022-09-19 LAB — ETHANOL: Alcohol, Ethyl (B): <10 mg/dL (ref <10)

## 2022-09-19 MED ORDER — SODIUM CHLORIDE 0.9 % IV BOLUS
1000.0000 mL | Freq: Once | INTRAVENOUS | Status: AC
  Administered 2022-09-19: 1000 mL via INTRAVENOUS

## 2022-09-19 MED ORDER — SODIUM CHLORIDE 0.9 % IV SOLN: INTRAVENOUS | Status: DC

## 2022-09-19 MED ORDER — IRBESARTAN 300 MG PO TABS
300.0000 mg | ORAL_TABLET | Freq: Every day | ORAL | Status: DC
  Administered 2022-09-19 – 2022-09-22 (×4): 300 mg via ORAL
  Filled 2022-09-19 (×4): qty 1

## 2022-09-19 MED ORDER — ADULT MULTIVITAMIN W/MINERALS CH
1.0000 | ORAL_TABLET | Freq: Every day | ORAL | Status: DC
  Administered 2022-09-20 – 2022-10-04 (×15): 1 via ORAL
  Filled 2022-09-19 (×15): qty 1

## 2022-09-19 MED ORDER — ENOXAPARIN SODIUM 40 MG/0.4ML IJ SOSY
40.0000 mg | PREFILLED_SYRINGE | INTRAMUSCULAR | Status: DC
  Administered 2022-09-20 – 2022-10-04 (×15): 40 mg via SUBCUTANEOUS
  Filled 2022-09-19 (×15): qty 0.4

## 2022-09-19 MED ORDER — LACTATED RINGERS IV BOLUS
1000.0000 mL | Freq: Once | INTRAVENOUS | Status: AC
  Administered 2022-09-19: 1000 mL via INTRAVENOUS

## 2022-09-19 MED ORDER — ATORVASTATIN CALCIUM 40 MG PO TABS
40.0000 mg | ORAL_TABLET | Freq: Every day | ORAL | Status: DC
  Administered 2022-09-19 – 2022-09-24 (×6): 40 mg via ORAL
  Filled 2022-09-19 (×6): qty 1

## 2022-09-19 MED ORDER — AMLODIPINE BESYLATE 10 MG PO TABS
10.0000 mg | ORAL_TABLET | Freq: Every day | ORAL | Status: DC
  Administered 2022-09-19 – 2022-10-04 (×16): 10 mg via ORAL
  Filled 2022-09-19 (×16): qty 1

## 2022-09-19 MED ORDER — LORAZEPAM 1 MG PO TABS: 1.0000 mg | ORAL_TABLET | Freq: Four times a day (QID) | ORAL | Status: AC | PRN

## 2022-09-19 MED ORDER — THIAMINE MONONITRATE 100 MG PO TABS
100.0000 mg | ORAL_TABLET | Freq: Every day | ORAL | Status: DC
  Administered 2022-09-20 – 2022-10-04 (×13): 100 mg via ORAL
  Filled 2022-09-19 (×15): qty 1

## 2022-09-19 MED ORDER — FOLIC ACID 1 MG PO TABS
1.0000 mg | ORAL_TABLET | Freq: Every day | ORAL | Status: DC
  Administered 2022-09-20 – 2022-10-04 (×15): 1 mg via ORAL
  Filled 2022-09-19 (×15): qty 1

## 2022-09-19 MED ORDER — CIPROFLOXACIN IN D5W 200 MG/100ML IV SOLN
200.0000 mg | Freq: Two times a day (BID) | INTRAVENOUS | Status: DC
  Administered 2022-09-19 – 2022-09-20 (×2): 200 mg via INTRAVENOUS
  Filled 2022-09-19 (×2): qty 100

## 2022-09-19 MED ORDER — ACETAMINOPHEN 325 MG PO TABS
650.0000 mg | ORAL_TABLET | Freq: Four times a day (QID) | ORAL | Status: DC | PRN
  Administered 2022-09-19 – 2022-09-25 (×8): 650 mg via ORAL
  Filled 2022-09-19 (×8): qty 2

## 2022-09-19 MED ORDER — THIAMINE HCL 100 MG/ML IJ SOLN
100.0000 mg | Freq: Every day | INTRAMUSCULAR | Status: DC
  Administered 2022-09-21 – 2022-09-27 (×2): 100 mg via INTRAVENOUS
  Filled 2022-09-19 (×3): qty 2

## 2022-09-19 MED ORDER — APALUTAMIDE 60 MG PO TABS: 240.0000 mg | ORAL_TABLET | Freq: Every day | ORAL | Status: DC

## 2022-09-19 MED ORDER — NICOTINE 21 MG/24HR TD PT24
21.0000 mg | MEDICATED_PATCH | Freq: Every day | TRANSDERMAL | Status: DC
  Administered 2022-09-19 – 2022-10-04 (×16): 21 mg via TRANSDERMAL
  Filled 2022-09-19 (×16): qty 1

## 2022-09-19 NOTE — Assessment & Plan Note (Signed)
-  CK of 6277 -Continuous IV fluid overnight

## 2022-09-19 NOTE — Assessment & Plan Note (Signed)
continue statin

## 2022-09-19 NOTE — Assessment & Plan Note (Signed)
-  reports multiple cocktail and beers daily with last drink "a few days ago" -place on CIWA q6hr

## 2022-09-19 NOTE — ED Triage Notes (Signed)
Patient brought in by EMS due to generalized weakness. Reports that it started yesterday. Patient currently taking chemo medications for prostate cancer. Has not been able to walk or sit up since yesterday.

## 2022-09-19 NOTE — Assessment & Plan Note (Addendum)
-  suspect weakness secondary to UTI. 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 Has chronic indwelling catheter for many years.  -As evidence with fever, tachycardia and positive UA -UA with small leukocytes and many bacteria. No dysuria but has suprapubic tenderness on exam.  -start IV Ciprofloxacin-has penicillin allergies -urine culture pending

## 2022-09-19 NOTE — Assessment & Plan Note (Signed)
-  secondary to dehydration -continuous IV fluid overnight

## 2022-09-19 NOTE — ED Provider Notes (Signed)
[place] EMERGENCY DEPARTMENT AT [facility] Provider Note   CSN: [id number] Arrival date & time: 09/19/22  1115     History  Chief Complaint  Patient presents with   Weakness    David Townsend is a 62 y.o. male with PMH HTN, HLD, alcohol use disorder, prostate cancer metastatic to bone, who presents to ED c/o generalized weakness since yesterday.  Patient reports that at baseline he is able to ambulate on his own unassisted.  He states that since yesterday he has been unable to get up on his own.  He has a minimal associated cough he attributes to smoking.  He does have a history of prostate cancer metastatic to the bone but states that he is not currently undergoing any chemotherapy infusions or radiation treatment at this time directed by his urologist who believed that his condition was stable and did not require any active treatment at this time.  He denies associated fever, chills, nausea, vomiting, diarrhea, abdominal pain, chest pain, shortness of breath, urinary symptoms, or problems with bowel movements.  He states that this happened once before and he attributed it to being dehydrated and improved after IV hydration.  Patient states that he has not been drinking much lately and believes this may also be the case today.      Home Medications Prior to Admission medications   Medication Sig Start Date End Date Taking? Authorizing Provider  amLODipine (NORVASC) 10 MG tablet Take 1 tablet (10 mg total) by mouth daily. 09/06/22 12/05/22  Rema Fendt, NP  atorvastatin (LIPITOR) 40 MG tablet Take 1 tablet (40 mg total) by mouth daily. 07/24/22 10/22/22  Rema Fendt, NP  ERLEADA 60 MG tablet Take 240 mg by mouth daily. 02/20/22   [provider]  olmesartan (BENICAR) 40 MG tablet TAKE 1 TABLET(40 MG) BY MOUTH DAILY 07/24/22   Rema Fendt, NP  spironolactone (ALDACTONE) 25 MG tablet Take 0.5 tablets (12.5 mg total) by mouth daily. 08/22/22   Rema Fendt, NP       Allergies    Penicillins    Review of Systems   Review of Systems  All other systems reviewed and are negative.   Physical Exam Updated Vital Signs BP (!) 162/76   Pulse 91   Temp 98.5 F (36.9 C) (Oral)   Resp 17   Ht 5\' 9"  (1.753 m)   Wt 70.3 kg   SpO2 95%   BMI 22.89 kg/m  Physical Exam Vitals and nursing note reviewed.  Constitutional:      General: He is not in acute distress.    Appearance: Normal appearance. He is not toxic-appearing or diaphoretic.     Comments: Generally weak to all extremities/trunk but strength is equal bilaterally, patient with significant difficulty sitting self up in bed even with assistance  HENT:     Head: Normocephalic and atraumatic.     Nose: Nose normal.     Mouth/Throat:     Mouth: Mucous membranes are dry.  Eyes:     General: No scleral icterus.    Extraocular Movements: Extraocular movements intact.     Conjunctiva/sclera: Conjunctivae normal.     Pupils: Pupils are equal, round, and reactive to light.  Cardiovascular:     Rate and Rhythm: Normal rate and regular rhythm.     Heart sounds: No murmur heard. Pulmonary:     Effort: Pulmonary effort is normal. No respiratory distress.     Breath sounds: Normal breath sounds.  No stridor. No wheezing, rhonchi or rales.  Abdominal:     General: Abdomen is flat. There is no distension.     Palpations: Abdomen is soft.     Tenderness: There is no abdominal tenderness. There is no right CVA tenderness, left CVA tenderness, guarding or rebound.  Musculoskeletal:        General: No deformity.     Cervical back: Normal range of motion and neck supple. No rigidity.     Right lower leg: No edema.     Left lower leg: No edema.     Comments: No midline spinal tenderness, stepoffs, or deformities  Skin:    General: Skin is warm and dry.     Capillary Refill: Capillary refill takes 2 to 3 seconds.     Coloration: Skin is not jaundiced or pale.     Findings: No rash.  Neurological:      General: No focal deficit present.     Mental Status: He is alert and oriented to person, place, and time.     GCS: GCS eye subscore is 4. GCS verbal subscore is 5. GCS motor subscore is 6.     Cranial Nerves: No cranial nerve deficit.     Sensory: No sensory deficit.     Comments: Patellar reflexes brisk and equal bilaterally  Psychiatric:        Mood and Affect: Mood normal.        Behavior: Behavior normal.     ED Results / Procedures / Treatments   Labs (all labs ordered are listed, but only abnormal results are displayed) Labs Reviewed  CBC WITH DIFFERENTIAL/PLATELET - Abnormal; Notable for the following components:      Result Value   WBC 16.6 (*)    RBC 3.18 (*)    Hemoglobin 12.8 (*)    HCT 35.7 (*)    MCV 112.3 (*)    MCH 40.3 (*)    Neutro Abs 15.1 (*)    Abs Immature Granulocytes 0.15 (*)    All other components within normal limits  BASIC METABOLIC PANEL - Abnormal; Notable for the following components:   Sodium 127 (*)    Chloride 91 (*)    Glucose, Bld 110 (*)    All other components within normal limits  URINALYSIS, ROUTINE W REFLEX MICROSCOPIC - Abnormal; Notable for the following components:   APPearance HAZY (*)    Hgb urine dipstick MODERATE (*)    Ketones, ur 20 (*)    Protein, ur 100 (*)    Leukocytes,Ua SMALL (*)    Bacteria, UA MANY (*)    All other components within normal limits  HEPATIC FUNCTION PANEL - Abnormal; Notable for the following components:   Total Protein 8.3 (*)    AST 148 (*)    All other components within normal limits  CK - Abnormal; Notable for the following components:   Total CK 6,277 (*)    All other components within normal limits  MAGNESIUM  ETHANOL  RAPID URINE DRUG SCREEN, HOSP PERFORMED  LACTIC ACID, PLASMA    EKG EKG Interpretation  Date/Time:  Wednesday September 19 2022 11:25:19 EDT Ventricular Rate:  101 PR Interval:  136 QRS Duration: 99 QT Interval:  344 QTC Calculation: 446 R Axis:   67 Text  Interpretation: Sinus tachycardia RSR' in V1 or V2, right VCD or RVH Nonspecific T abnormalities, lateral leads No significant change since last tracing Confirmed by Gwyneth Sprout ([id number]) on 09/19/2022 12:00:08 PM  Radiology MR  LUMBAR SPINE WO CONTRAST  Result Date: 09/19/2022 CLINICAL DATA:  Bilateral lower extremity weakness, right greater than left getting treatment for prostate cancer. EXAM: MRI THORACIC AND LUMBAR SPINE WITHOUT CONTRAST TECHNIQUE: Multiplanar and multiecho pulse sequences of the thoracic and lumbar spine were obtained without intravenous contrast. COMPARISON:  NM bone scan 07/23/2021, CT 07/12/2021. FINDINGS: MRI THORACIC SPINE FINDINGS Alignment:  Physiologic. Vertebrae: There is diffuse osseous metastatic disease throughout the thoracic spine, and also notable in the sternum. There are areas of marrow edema signal within multiple vertebral bodies but no evidence of acute compression fracture. No evidence of discitis. Cord: No abnormal cord signal. Paraspinal and other soft tissues: Left posterior medial lung signal abnormality/consolidation. Disc levels: Evaluation of the disc levels are demonstrates no large disc herniation, significant spinal canal or neural foraminal narrowing. MRI LUMBAR SPINE FINDINGS Segmentation:  Standard. Alignment:  Physiologic. Vertebrae: Diffuse osseous metastatic disease throughout the lumbar spine and partially visualized sacrum. Areas of marrow edema signal but no evidence of acute compression fracture. Conus medullaris and cauda equina: Conus extends to the L1 level. Conus and cauda equina appear normal. Paraspinal and other soft tissues: Negative. Disc levels: T12-L1: No significant spinal canal or neural foraminal narrowing. L1-L2: A minimal asymmetric right disc bulging. No significant spinal canal or neural foraminal stenosis. L2-L3: Minimal disc bulging. No significant spinal canal or neural foraminal stenosis. L3-L4: Mild disc bulging, ligament  flavum hypertrophy and bilateral facet arthropathy. Mild bilateral subarticular narrowing and mild right-sided neural foraminal narrowing. L4-L5: Broad disc bulging, ligament flavum hypertrophy and bilateral facet arthropathy. There is moderate spinal canal stenosis and moderate bilateral neural foraminal stenosis. L5-S1: Broad disc bulging, ligament flavum hypertrophy and bilateral facet arthropathy. There is moderate bilateral neural foraminal stenosis. No spinal canal stenosis. Small perineural cyst on the right at S2. IMPRESSION: Diffuse osseous metastatic disease throughout the thoracic spine, lumbar spine, and in the partially visualized sacrum. No evidence of cord compression or abnormal cord signal. No evidence of acute fracture. Multilevel degenerative changes of the lumbar spine. Moderate spinal canal stenosis at L4-L5. Moderate bilateral neural foraminal stenosis at L4-L5 and L5-S1. Left posterior medial lung signal abnormality/consolidation, could reflect pneumonia. Recommend correlation with chest CT. Electronically Signed   By: Caprice Renshaw M.D.   On: 09/19/2022 18:55   MR THORACIC SPINE WO CONTRAST  Result Date: 09/19/2022 CLINICAL DATA:  Bilateral lower extremity weakness, right greater than left getting treatment for prostate cancer. EXAM: MRI THORACIC AND LUMBAR SPINE WITHOUT CONTRAST TECHNIQUE: Multiplanar and multiecho pulse sequences of the thoracic and lumbar spine were obtained without intravenous contrast. COMPARISON:  NM bone scan 07/23/2021, CT 07/12/2021. FINDINGS: MRI THORACIC SPINE FINDINGS Alignment:  Physiologic. Vertebrae: There is diffuse osseous metastatic disease throughout the thoracic spine, and also notable in the sternum. There are areas of marrow edema signal within multiple vertebral bodies but no evidence of acute compression fracture. No evidence of discitis. Cord: No abnormal cord signal. Paraspinal and other soft tissues: Left posterior medial lung signal  abnormality/consolidation. Disc levels: Evaluation of the disc levels are demonstrates no large disc herniation, significant spinal canal or neural foraminal narrowing. MRI LUMBAR SPINE FINDINGS Segmentation:  Standard. Alignment:  Physiologic. Vertebrae: Diffuse osseous metastatic disease throughout the lumbar spine and partially visualized sacrum. Areas of marrow edema signal but no evidence of acute compression fracture. Conus medullaris and cauda equina: Conus extends to the L1 level. Conus and cauda equina appear normal. Paraspinal and other soft tissues: Negative. Disc levels: T12-L1: No significant spinal  canal or neural foraminal narrowing. L1-L2: A minimal asymmetric right disc bulging. No significant spinal canal or neural foraminal stenosis. L2-L3: Minimal disc bulging. No significant spinal canal or neural foraminal stenosis. L3-L4: Mild disc bulging, ligament flavum hypertrophy and bilateral facet arthropathy. Mild bilateral subarticular narrowing and mild right-sided neural foraminal narrowing. L4-L5: Broad disc bulging, ligament flavum hypertrophy and bilateral facet arthropathy. There is moderate spinal canal stenosis and moderate bilateral neural foraminal stenosis. L5-S1: Broad disc bulging, ligament flavum hypertrophy and bilateral facet arthropathy. There is moderate bilateral neural foraminal stenosis. No spinal canal stenosis. Small perineural cyst on the right at S2. IMPRESSION: Diffuse osseous metastatic disease throughout the thoracic spine, lumbar spine, and in the partially visualized sacrum. No evidence of cord compression or abnormal cord signal. No evidence of acute fracture. Multilevel degenerative changes of the lumbar spine. Moderate spinal canal stenosis at L4-L5. Moderate bilateral neural foraminal stenosis at L4-L5 and L5-S1. Left posterior medial lung signal abnormality/consolidation, could reflect pneumonia. Recommend correlation with chest CT. Electronically Signed   By: Caprice Renshaw M.D.   On: 09/19/2022 18:55   CT Head Wo Contrast  Result Date: 09/19/2022 CLINICAL DATA:  Altered mental status. EXAM: CT HEAD WITHOUT CONTRAST TECHNIQUE: Contiguous axial images were obtained from the base of the skull through the vertex without intravenous contrast. RADIATION DOSE REDUCTION: This exam was performed according to the departmental dose-optimization program which includes automated exposure control, adjustment of the mA and/or kV according to patient size and/or use of iterative reconstruction technique. COMPARISON:  None Available. FINDINGS: Brain: Mild age-related atrophy and chronic microvascular ischemic changes. There is no acute intracranial hemorrhage. No mass effect or midline shift. No extra-axial fluid collection. Vascular: No hyperdense vessel or unexpected calcification. Skull: No acute calvarial pathology. Sclerotic osseous metastatic disease in keeping with known metastatic prostate cancer. Sinuses/Orbits: Mild mucoperiosteal thickening of paranasal sinuses. The mastoid air cells are clear. Other: None IMPRESSION: 1. No acute intracranial pathology. 2. Mild age-related atrophy and chronic microvascular ischemic changes. 3. 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 Electronically Signed   By: Elgie Collard M.D.   On: 09/19/2022 18:39   DG Chest Portable 1 View  Result Date: 09/19/2022 CLINICAL DATA:  cough, weakness EXAM: PORTABLE CHEST - 1 VIEW COMPARISON:  04/29/2018 FINDINGS: Cardiac silhouette is unremarkable. No pneumothorax or pleural effusion. The lungs are clear. The visualized skeletal structures are unremarkable. IMPRESSION: No acute cardiopulmonary process. Electronically Signed   By: Layla Maw M.D.   On: 09/19/2022 12:35    Procedures .Critical Care  Performed by: Tonette Lederer, PA-C Authorized by: Tonette Lederer, PA-C   Critical care provider statement:    Critical care time (minutes):  45   Critical care time was exclusive of:  Separately  billable procedures and treating other patients and teaching time   Critical care was necessary to treat or prevent imminent or life-threatening deterioration of the following conditions:  Cardiac failure, circulatory failure, CNS failure or compromise, renal failure, respiratory failure, sepsis, shock, dehydration, endocrine crisis, hepatic failure and metabolic crisis   Critical care was time spent personally by me on the following activities:  Development of treatment plan with patient or surrogate, discussions with consultants, evaluation of patient's response to treatment, examination of patient, obtaining history from patient or surrogate, ordering and performing treatments and interventions, ordering and review of laboratory studies, ordering and review of radiographic studies, pulse oximetry, re-evaluation of patient's condition and review of old charts   Care discussed with: admitting provider  Medications Ordered in ED Medications  sodium chloride 0.9 % bolus 1,000 mL (0 mLs Intravenous Stopped 09/19/22 1550)  lactated ringers bolus 1,000 mL (0 mLs Intravenous Stopped 09/19/22 1738)    ED Course/ Medical Decision Making/ A&P                             Medical Decision Making Amount and/or Complexity of Data Reviewed Labs: ordered. Decision-making details documented in ED Course. Radiology: ordered. Decision-making details documented in ED Course. ECG/medicine tests: ordered. Decision-making details documented in ED Course.  Risk Decision regarding hospitalization.   Medical Decision Making:   David Townsend is a 62 y.o. male who presented to the ED today with generalized weakness detailed above.    Patient's presentation is complicated by their history of HTN, HLD, alcohol use disorder, prostate cancer.  Patient placed on continuous vitals and telemetry monitoring while in ED which was reviewed periodically.  Complete initial physical exam performed, notably the patient  was in no acute distress. Alert and oriented. CNI. Generally weak, no focal neuro deficit. Abdomen soft and nontender.  Dry mucus membranes and slightly delayed capillary refill. Nontoxic appearing.  Reviewed and confirmed nursing documentation for past medical history, family history, social history.    Initial Assessment:   With the patient's presentation of generalized weakness, differential diagnosis includes but is not limited to CVA, spinal cord injury, ACS, arrhythmia, syncope, orthostatic hypotension, sepsis, hypoglycemia, hypoxia, electrolyte disturbance, endocrine disorder, anemia, environmental exposure, polypharmacy. This is most consistent with an acute complicated illness  Initial Plan:  Screening labs including CBC and Metabolic panel to evaluate for infectious or metabolic etiology of disease.  Urinalysis with reflex culture ordered to evaluate for UTI or relevant urologic/nephrologic pathology.  CXR to evaluate for structural/infectious intrathoracic pathology.  EKG to evaluate for cardiac pathology Hepatic function panel due to history of AUD IV fluids to treat dehydration Objective evaluation as below reviewed   1340 - I went to recheck patient.  He has accidentally spilled water in the room.  He is awake and watching TV and without signs of acute distress but he does appear slightly confused compared to my initial evaluation of him.  He is asking what multiple common items in the room are. He is still moving all extremities and has a nonfocal neurologic exam.  Workup so far is unrevealing.  His history was pertinent for history of alcohol use disorder so we will add an ethanol level and UDS but also CT brain with his history of metastatic prostate cancer and questionable altered mental status for further assessment. Pt has unknown LKWT as he reports now he has had generalized weakness and been lying on the floor of his home for 2 days. CK and lactate added as well for further  evaluation.   1400 - Rechecked patient with attending physician Dr. Revonda Humphrey at bedside.  Patient answering questions appropriately and mental status does seem improved.  Still with weakness to both legs.  He does seem to have greater weakness to the right leg compared to the left.  No other focal deficits identified on repeat neurologic exam.  Brisk, equal patellar reflexes bilaterally.  Normal sensation.  Will add MR T/L-spine without contrast for further evaluation.  Do not suspect GBS.  On multiple following rechecks, pt remains alert and oriented in no acute distress. Delay in disposition while obtaining imaging. LR bolus added while imaging pending with elevated CK. Pt aware of plan  for admission and agreeable.   Initial Study Results:   Laboratory  All laboratory results reviewed without evidence of clinically relevant pathology.   Exceptions include: WBC 16.6, Hgb 12.8,  AST 148, NA 127, Cl 91, CK 6277  EKG EKG was reviewed independently. Sinus tachy, no STEMI  Radiology:  All images reviewed independently. Agree with radiology report at this time.   MR LUMBAR SPINE WO CONTRAST  Result Date: 09/19/2022 CLINICAL DATA:  Bilateral lower extremity weakness, right greater than left getting treatment for prostate cancer. EXAM: MRI THORACIC AND LUMBAR SPINE WITHOUT CONTRAST TECHNIQUE: Multiplanar and multiecho pulse sequences of the thoracic and lumbar spine were obtained without intravenous contrast. COMPARISON:  NM bone scan 07/23/2021, CT 07/12/2021. FINDINGS: MRI THORACIC SPINE FINDINGS Alignment:  Physiologic. Vertebrae: There is diffuse osseous metastatic disease throughout the thoracic spine, and also notable in the sternum. There are areas of marrow edema signal within multiple vertebral bodies but no evidence of acute compression fracture. No evidence of discitis. Cord: No abnormal cord signal. Paraspinal and other soft tissues: Left posterior medial lung signal abnormality/consolidation.  Disc levels: Evaluation of the disc levels are demonstrates no large disc herniation, significant spinal canal or neural foraminal narrowing. MRI LUMBAR SPINE FINDINGS Segmentation:  Standard. Alignment:  Physiologic. Vertebrae: Diffuse osseous metastatic disease throughout the lumbar spine and partially visualized sacrum. Areas of marrow edema signal but no evidence of acute compression fracture. Conus medullaris and cauda equina: Conus extends to the L1 level. Conus and cauda equina appear normal. Paraspinal and other soft tissues: Negative. Disc levels: T12-L1: No significant spinal canal or neural foraminal narrowing. L1-L2: A minimal asymmetric right disc bulging. No significant spinal canal or neural foraminal stenosis. L2-L3: Minimal disc bulging. No significant spinal canal or neural foraminal stenosis. L3-L4: Mild disc bulging, ligament flavum hypertrophy and bilateral facet arthropathy. Mild bilateral subarticular narrowing and mild right-sided neural foraminal narrowing. L4-L5: Broad disc bulging, ligament flavum hypertrophy and bilateral facet arthropathy. There is moderate spinal canal stenosis and moderate bilateral neural foraminal stenosis. L5-S1: Broad disc bulging, ligament flavum hypertrophy and bilateral facet arthropathy. There is moderate bilateral neural foraminal stenosis. No spinal canal stenosis. Small perineural cyst on the right at S2. IMPRESSION: Diffuse osseous metastatic disease throughout the thoracic spine, lumbar spine, and in the partially visualized sacrum. No evidence of cord compression or abnormal cord signal. No evidence of acute fracture. Multilevel degenerative changes of the lumbar spine. Moderate spinal canal stenosis at L4-L5. Moderate bilateral neural foraminal stenosis at L4-L5 and L5-S1. Left posterior medial lung signal abnormality/consolidation, could reflect pneumonia. Recommend correlation with chest CT. Electronically Signed   By: Caprice Renshaw M.D.   On: 09/19/2022  18:55   MR THORACIC SPINE WO CONTRAST  Result Date: 09/19/2022 CLINICAL DATA:  Bilateral lower extremity weakness, right greater than left getting treatment for prostate cancer. EXAM: MRI THORACIC AND LUMBAR SPINE WITHOUT CONTRAST TECHNIQUE: Multiplanar and multiecho pulse sequences of the thoracic and lumbar spine were obtained without intravenous contrast. COMPARISON:  NM bone scan 07/23/2021, CT 07/12/2021. FINDINGS: MRI THORACIC SPINE FINDINGS Alignment:  Physiologic. Vertebrae: There is diffuse osseous metastatic disease throughout the thoracic spine, and also notable in the sternum. There are areas of marrow edema signal within multiple vertebral bodies but no evidence of acute compression fracture. No evidence of discitis. Cord: No abnormal cord signal. Paraspinal and other soft tissues: Left posterior medial lung signal abnormality/consolidation. Disc levels: Evaluation of the disc levels are demonstrates no large disc herniation, significant spinal canal or  neural foraminal narrowing. MRI LUMBAR SPINE FINDINGS Segmentation:  Standard. Alignment:  Physiologic. Vertebrae: Diffuse osseous metastatic disease throughout the lumbar spine and partially visualized sacrum. Areas of marrow edema signal but no evidence of acute compression fracture. Conus medullaris and cauda equina: Conus extends to the L1 level. Conus and cauda equina appear normal. Paraspinal and other soft tissues: Negative. Disc levels: T12-L1: No significant spinal canal or neural foraminal narrowing. L1-L2: A minimal asymmetric right disc bulging. No significant spinal canal or neural foraminal stenosis. L2-L3: Minimal disc bulging. No significant spinal canal or neural foraminal stenosis. L3-L4: Mild disc bulging, ligament flavum hypertrophy and bilateral facet arthropathy. Mild bilateral subarticular narrowing and mild right-sided neural foraminal narrowing. L4-L5: Broad disc bulging, ligament flavum hypertrophy and bilateral facet  arthropathy. There is moderate spinal canal stenosis and moderate bilateral neural foraminal stenosis. L5-S1: Broad disc bulging, ligament flavum hypertrophy and bilateral facet arthropathy. There is moderate bilateral neural foraminal stenosis. No spinal canal stenosis. Small perineural cyst on the right at S2. IMPRESSION: Diffuse osseous metastatic disease throughout the thoracic spine, lumbar spine, and in the partially visualized sacrum. No evidence of cord compression or abnormal cord signal. No evidence of acute fracture. Multilevel degenerative changes of the lumbar spine. Moderate spinal canal stenosis at L4-L5. Moderate bilateral neural foraminal stenosis at L4-L5 and L5-S1. Left posterior medial lung signal abnormality/consolidation, could reflect pneumonia. Recommend correlation with chest CT. Electronically Signed   By: Caprice Renshaw M.D.   On: 09/19/2022 18:55   CT Head Wo Contrast  Result Date: 09/19/2022 CLINICAL DATA:  Altered mental status. EXAM: CT HEAD WITHOUT CONTRAST TECHNIQUE: Contiguous axial images were obtained from the base of the skull through the vertex without intravenous contrast. RADIATION DOSE REDUCTION: This exam was performed according to the departmental dose-optimization program which includes automated exposure control, adjustment of the mA and/or kV according to patient size and/or use of iterative reconstruction technique. COMPARISON:  None Available. FINDINGS: Brain: Mild age-related atrophy and chronic microvascular ischemic changes. There is no acute intracranial hemorrhage. No mass effect or midline shift. No extra-axial fluid collection. Vascular: No hyperdense vessel or unexpected calcification. Skull: No acute calvarial pathology. Sclerotic osseous metastatic disease in keeping with known metastatic prostate cancer. Sinuses/Orbits: Mild mucoperiosteal thickening of paranasal sinuses. The mastoid air cells are clear. Other: None IMPRESSION: 1. No acute intracranial  pathology. 2. Mild age-related atrophy and chronic microvascular ischemic changes. 3. 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 Electronically Signed   By: Elgie Collard M.D.   On: 09/19/2022 18:39   DG Chest Portable 1 View  Result Date: 09/19/2022 CLINICAL DATA:  cough, weakness EXAM: PORTABLE CHEST - 1 VIEW COMPARISON:  04/29/2018 FINDINGS: Cardiac silhouette is unremarkable. No pneumothorax or pleural effusion. The lungs are clear. The visualized skeletal structures are unremarkable. IMPRESSION: No acute cardiopulmonary process. Electronically Signed   By: Layla Maw M.D.   On: 09/19/2022 12:35      Consults: Case discussed with hospitalist who accepts admission.   Final Assessment and Plan:   62 year old male presenting to ED for evaluation of generalized weakness.  Vital signs initially hypertensive, afebrile, mild tachycardia. Exam initially pertinent for generalized weakness, no focal deficits. Brief episode questionable altered mental status on re-exam. On assessment with attending physician at bedside, questionable greater weakness to right leg compared to left. No facial droop, no slurred speech, UE strength equal bilaterally, no sensory deficits. Pt denied fall/trauma. Hx prostate cancer with bone metastasis, unspecified location. Initial labs revealed leukocytosis 16.6, Hgb  12.8, normal lactic, hyponatremia 127. CXR negative. EKG unremarkable. Nontoxic appearing, initially questionably meets SIRS criteria with mild tachycardia and elevated WBC. Tachycardia resolved with fluid bolus. Pt did later develop low grade fever. No infectious source identified. No meningismus on exam. LCTA. No signs of infection to skin. Minimal improvement in weakness following 2L IV fluid bolus. MR T/L spine revealed diffuse osseous metastatic disease but no cord compression. Unclear if metastatic disease advanced or changed from previous. CK elevated but not to level of typical acute rhabdo, unclear timeframe of  pt's symptoms so underlying rhabdo may be contributor to pt's weakness. No AKI or other signs of rhabdo. Possible early UTI with small leukocytes. No nitrites or abdominal pain however. Could consider as source for sepsis. With functional decline and abnormalities as noted, believe pt would most benefit from medical admission. Hospitalist consulted and will admit. Pt stable at time of admission.    Clinical Impression:  1. Generalized weakness   2. Dehydration   3. Elevated CK   4. Malignant neoplasm metastatic to bone   5. Sepsis, due to unspecified organism, unspecified whether acute organ dysfunction present   6. Acute cystitis with hematuria      Admit           Final Clinical Impression(s) / ED Diagnoses Final diagnoses:  Dehydration  Generalized weakness  Elevated CK  Malignant neoplasm metastatic to bone  Sepsis, due to unspecified organism, unspecified whether acute organ dysfunction present  Acute cystitis with hematuria    Rx / DC Orders ED Discharge Orders     None         Tonette Lederer, PA-C 09/19/22 2331    Gwyneth Sprout, MD 09/26/22 0013

## 2022-09-19 NOTE — H&P (Signed)
History and Physical    Patient: David Townsend UJW:[id number] DOB: [birth year] DOA: 09/19/2022 DOS: the patient was seen and examined on 09/19/2022 PCP: Rema Fendt, NP  Patient coming from: Home  Chief Complaint:  Chief Complaint  Patient presents with   Weakness   HPI: David Townsend is a 62 y.o. 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   Reports a few days ago he felt generalized weakness and fell in the bathroom. Had to crawl to get back into bed. Mostly has been in bed since. Today slipped out of bed and laid on floor for 3 hours unable to get up. States he has abdominal pain but thinks it is due to straining to try and get up off the floor. No nausea, vomiting, or diarrhea. No dysuria. Has chronic cough thinks due to smoking but no worse than usual. No runny nose. No fever. Has decrease intake. Has not been able to get up to take his medications.   In the ED, initially afebrile and then later with temperature of 100.68F. BP up to 181/78.HR 102.   Leukocytosis of 16.6 with left shift. Hgb of 12.8. Plt of 193. Lactic within normal limits.   Hyponatremia of 127, K of 3.6, Chloride of 91. Creatinine of 0.99. Mg within normal limits LFTs normal.  CK 6277  ETOH and UDS negative.  UA with small leukocytes and many bacteria.   MRI thoracic and lumbar spine with diffuse osseous metastatic disease through spine and sacrum. No cord compression or abnormal cord signal.   CT head without acute pathology. There is sclerotic metastasis to the skull.   He was given 2L IV fluid bolus and hospitalist was consulted for admission.  Review of Systems: As mentioned in the history of present illness. All other systems reviewed and are negative. 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 He has a 68.00 pack-year smoking history. He has been exposed to tobacco smoke. He has never used smokeless tobacco. He reports current alcohol use. He reports that he does not use drugs.  Allergies  Allergen Reactions   Penicillins     Whelps  Has patient had a PCN reaction causing immediate rash, facial/tongue/throat swelling, SOB or lightheadedness with hypotension: Y Has patient had a PCN reaction causing severe rash involving mucus membranes or skin necrosis: Y Has patient had a PCN reaction that required hospitalization: N Has patient had a PCN reaction occurring within the last 10 years: N If all of the above answers are "NO", then may proceed with Cephalosporin use.     Family History  Family history unknown: Yes    Prior to Admission medications   Medication Sig Start Date End Date Taking? Authorizing Provider  amLODipine (NORVASC) 10 MG tablet Take 1 tablet (10 mg total) by mouth daily.  09/06/22 12/05/22  Rema Fendt, NP  atorvastatin (LIPITOR) 40 MG tablet Take 1 tablet (40 mg total) by mouth daily. 07/24/22 10/22/22  Rema Fendt, NP  ERLEADA 60 MG tablet Take 240 mg by mouth daily. 02/20/22   [provider]  olmesartan (BENICAR) 40 MG tablet TAKE 1 TABLET(40 MG) BY MOUTH DAILY 07/24/22   Rema Fendt, NP  spironolactone (ALDACTONE) 25 MG tablet Take 0.5 tablets (12.5 mg total) by mouth daily. 08/22/22   Rema Fendt, NP    Physical Exam: Vitals:    09/19/22 2100 09/19/22 2115 09/19/22 2130 09/19/22 2204  BP: (!) 171/82  (!) 150/83 136/87  Pulse: 90 96 94 99  Resp:  (!) 24 (!) 23 18  Temp:   100.3 F (37.9 C) (!) 101.3 F (38.5 C)  TempSrc:   Oral Oral  SpO2: 95% 96% 96% 96%  Weight:      Height:       Constitutional: NAD, calm, comfortable, non-toxic well appearing elderly male laying in bed Eyes: lids and conjunctivae normal ENMT: Mucous membranes are moist.  Neck: normal, supple Respiratory: clear to auscultation bilaterally, no wheezing, no crackles. Normal respiratory effort. No accessory muscle use.  Cardiovascular: Regular rate and rhythm, no murmurs / rubs / gallops. No extremity edema.   Abdomen: soft, non-distended, suprapubic tenderness, no masses palpated.  No rebound tenderness, guarding or rigidity. GU: Indwelling Foley catheter in place Musculoskeletal: no clubbing / cyanosis. No joint deformity upper and lower extremities. Good ROM, no contractures. Normal muscle tone.  Skin: no rashes, lesions, ulcers.  Neurologic: CN 2-12 grossly intact. Strength 5/5 in all 4.  Equal and firm bilateral symmetric handgrip. Psychiatric: Normal judgment and insight. Alert and oriented x 3. Normal mood. Data Reviewed:  See HPI  Assessment and Plan: * Sepsis secondary to UTI -suspect weakness secondary to UTI. 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 Has chronic indwelling catheter for many years.  -As evidence with fever, tachycardia and positive UA -UA with small leukocytes and many bacteria. No dysuria but has suprapubic tenderness on exam.  -start IV Ciprofloxacin-has penicillin allergies -urine culture pending   Hyponatremia -secondary to dehydration -continuous IV fluid overnight  Rhabdomyolysis -CK of 6277 -Continuous IV fluid overnight  Essential hypertension -uncontrolled due to missed medication the past few days -continue amlodipine, ACE-I -Holding diuretic for now while  receiving IV fluids  Tobacco abuse -smokes a pack daily -nicotine patch daily  Prostate cancer metastatic to bone -Follows with Dr. Berneice [facility] -will need to take home Erleada as it is non-formulary -also on Eligard injections q6 months  Hyperlipidemia -continue statin  ETOH abuse -reports multiple cocktail and beers daily with last drink "a few days ago" -place on CIWA q6hr      Advance Care Planning:   Code Status: Full Code   Consults: none  Family Communication: none at bedside  Severity of Illness: The appropriate patient status for this patient is OBSERVATION. Observation status is judged to be reasonable and necessary in order to provide the required intensity of service to ensure the patient's safety. The patient's presenting symptoms, physical exam findings, and initial radiographic and laboratory data in the context of their medical condition is felt to place them at decreased risk for further clinical deterioration. Furthermore, it is anticipated that the patient will be medically stable for discharge from the hospital within 2 midnights of admission.   Author: Anselm Jungling, DO 09/19/2022 11:35 PM  For on call review [email or web address].

## 2022-09-19 NOTE — Assessment & Plan Note (Signed)
-  uncontrolled due to missed medication the past few days -continue amlodipine, ACE-I -Holding diuretic for now while receiving IV fluids

## 2022-09-19 NOTE — Assessment & Plan Note (Signed)
-  smokes a pack daily -nicotine patch daily

## 2022-09-19 NOTE — ED Provider Triage Note (Signed)
Emergency Medicine Provider Triage Evaluation Note  David Townsend , a 62 y.o. male  was evaluated in triage.  Pt complains of weakness to back and legs. Unable to walk since yesterday. Has foley at baseline. Getting tx for prostate CA. No bowel incontinence   Review of Systems  Positive: Weakness, leg weakness Negative:   Physical Exam  BP 126/71 (BP Location: Right Arm)   Pulse (!) 102   Temp 98.8 F (37.1 C) (Oral)   Resp (!) 22   Ht  (1.753 m)   Wt 70.3 kg   SpO2 98%   BMI 22.89 kg/m  Gen:   Awake, no distress   Resp:  Normal effort  MSK:   Moves extremities without difficulty, decreased strength to BLE. Other:    Medical Decision Making  Medically screening exam initiated at 11:50 AM.  Appropriate orders placed.  David Townsend was informed that the remainder of the evaluation will be completed by another provider, this initial triage assessment does not replace that evaluation, and the importance of remaining in the ED until their evaluation is complete.  weakness   Crescentia Boutwell A, PA-C 09/19/22 1151

## 2022-09-19 NOTE — Assessment & Plan Note (Addendum)
-  Follows with Dr. Berneice [facility] -will need to take home Erleada as it is non-formulary -also on Eligard injections q6 months

## 2022-09-20 DIAGNOSIS — R6520 Severe sepsis without septic shock: Secondary | ICD-10-CM | POA: Diagnosis not present

## 2022-09-20 DIAGNOSIS — E785 Hyperlipidemia, unspecified: Secondary | ICD-10-CM | POA: Diagnosis present

## 2022-09-20 DIAGNOSIS — Z79899 Other long term (current) drug therapy: Secondary | ICD-10-CM | POA: Diagnosis not present

## 2022-09-20 DIAGNOSIS — N390 Urinary tract infection, site not specified: Secondary | ICD-10-CM

## 2022-09-20 DIAGNOSIS — I2510 Atherosclerotic heart disease of native coronary artery without angina pectoris: Secondary | ICD-10-CM | POA: Diagnosis present

## 2022-09-20 DIAGNOSIS — Y731 Therapeutic (nonsurgical) and rehabilitative gastroenterology and urology devices associated with adverse incidents: Secondary | ICD-10-CM | POA: Diagnosis present

## 2022-09-20 DIAGNOSIS — E538 Deficiency of other specified B group vitamins: Secondary | ICD-10-CM | POA: Diagnosis present

## 2022-09-20 DIAGNOSIS — Z880 Allergy status to penicillin: Secondary | ICD-10-CM | POA: Diagnosis not present

## 2022-09-20 DIAGNOSIS — F1010 Alcohol abuse, uncomplicated: Secondary | ICD-10-CM | POA: Diagnosis present

## 2022-09-20 DIAGNOSIS — J181 Lobar pneumonia, unspecified organism: Secondary | ICD-10-CM | POA: Diagnosis not present

## 2022-09-20 DIAGNOSIS — C61 Malignant neoplasm of prostate: Secondary | ICD-10-CM | POA: Diagnosis present

## 2022-09-20 DIAGNOSIS — A419 Sepsis, unspecified organism: Secondary | ICD-10-CM | POA: Diagnosis not present

## 2022-09-20 DIAGNOSIS — J80 Acute respiratory distress syndrome: Secondary | ICD-10-CM | POA: Diagnosis not present

## 2022-09-20 DIAGNOSIS — A4151 Sepsis due to Escherichia coli [E. coli]: Secondary | ICD-10-CM | POA: Diagnosis present

## 2022-09-20 DIAGNOSIS — Z1152 Encounter for screening for COVID-19: Secondary | ICD-10-CM | POA: Diagnosis not present

## 2022-09-20 DIAGNOSIS — C7951 Secondary malignant neoplasm of bone: Secondary | ICD-10-CM | POA: Diagnosis present

## 2022-09-20 DIAGNOSIS — E222 Syndrome of inappropriate secretion of antidiuretic hormone: Secondary | ICD-10-CM | POA: Diagnosis present

## 2022-09-20 DIAGNOSIS — F17210 Nicotine dependence, cigarettes, uncomplicated: Secondary | ICD-10-CM | POA: Diagnosis present

## 2022-09-20 DIAGNOSIS — M6282 Rhabdomyolysis: Secondary | ICD-10-CM | POA: Diagnosis present

## 2022-09-20 DIAGNOSIS — W06XXXA Fall from bed, initial encounter: Secondary | ICD-10-CM | POA: Diagnosis present

## 2022-09-20 DIAGNOSIS — A4181 Sepsis due to Enterococcus: Secondary | ICD-10-CM | POA: Diagnosis present

## 2022-09-20 DIAGNOSIS — T83511A Infection and inflammatory reaction due to indwelling urethral catheter, initial encounter: Secondary | ICD-10-CM | POA: Diagnosis present

## 2022-09-20 DIAGNOSIS — Y95 Nosocomial condition: Secondary | ICD-10-CM | POA: Diagnosis not present

## 2022-09-20 DIAGNOSIS — E876 Hypokalemia: Secondary | ICD-10-CM | POA: Diagnosis present

## 2022-09-20 DIAGNOSIS — I10 Essential (primary) hypertension: Secondary | ICD-10-CM | POA: Diagnosis present

## 2022-09-20 DIAGNOSIS — E874 Mixed disorder of acid-base balance: Secondary | ICD-10-CM | POA: Diagnosis not present

## 2022-09-20 DIAGNOSIS — E860 Dehydration: Secondary | ICD-10-CM | POA: Diagnosis present

## 2022-09-20 DIAGNOSIS — R509 Fever, unspecified: Secondary | ICD-10-CM | POA: Diagnosis not present

## 2022-09-20 LAB — BASIC METABOLIC PANEL
Anion gap: 12 (ref 5–15)
BUN: 14 mg/dL (ref 8–23)
CO2: 20 mmol/L — ABNORMAL LOW (ref 22–32)
Calcium: 8.7 mg/dL — ABNORMAL LOW (ref 8.9–10.3)
Chloride: 93 mmol/L — ABNORMAL LOW (ref 98–111)
Creatinine, Ser: 0.85 mg/dL (ref 0.61–1.24)
GFR, Estimated: >60 mL/min (ref >60)
Glucose, Bld: 79 mg/dL (ref 70–99)
Potassium: 3.4 mmol/L — ABNORMAL LOW (ref 3.5–5.1)
Sodium: 125 mmol/L — ABNORMAL LOW (ref 135–145)

## 2022-09-20 LAB — HIV ANTIBODY (ROUTINE TESTING W REFLEX): HIV Screen 4th Generation wRfx: NONREACTIVE

## 2022-09-20 LAB — CBC
HCT: 32.4 % — ABNORMAL LOW (ref 39.0–52.0)
Hemoglobin: 11.1 g/dL — ABNORMAL LOW (ref 13.0–17.0)
MCH: 39.1 pg — ABNORMAL HIGH (ref 26.0–34.0)
MCHC: 34.3 g/dL (ref 30.0–36.0)
MCV: 114.1 fL — ABNORMAL HIGH (ref 80.0–100.0)
Platelets: 158 10*3/uL (ref 150–400)
RBC: 2.84 MIL/uL — ABNORMAL LOW (ref 4.22–5.81)
RDW: 11.9 % (ref 11.5–15.5)
WBC: 13.9 10*3/uL — ABNORMAL HIGH (ref 4.0–10.5)
nRBC: 0 % (ref 0.0–0.2)

## 2022-09-20 LAB — CK: Total CK: 3688 U/L — ABNORMAL HIGH (ref 49–397)

## 2022-09-20 MED ORDER — IBUPROFEN 800 MG PO TABS
800.0000 mg | ORAL_TABLET | Freq: Four times a day (QID) | ORAL | Status: DC | PRN
  Administered 2022-09-20 – 2022-10-04 (×9): 800 mg via ORAL
  Filled 2022-09-20 (×9): qty 1

## 2022-09-20 MED ORDER — SODIUM CHLORIDE 0.9 % IV SOLN
1.0000 g | INTRAVENOUS | Status: DC
  Administered 2022-09-20 – 2022-09-21 (×2): 1 g via INTRAVENOUS
  Filled 2022-09-20 (×2): qty 10

## 2022-09-20 MED ORDER — CHLORHEXIDINE GLUCONATE CLOTH 2 % EX PADS
6.0000 | MEDICATED_PAD | Freq: Every day | CUTANEOUS | Status: DC
  Administered 2022-09-20 – 2022-09-22 (×4): 6 via TOPICAL

## 2022-09-20 MED ORDER — SODIUM CHLORIDE 0.9 % IV SOLN: INTRAVENOUS | Status: DC

## 2022-09-20 MED ORDER — POTASSIUM CHLORIDE CRYS ER 20 MEQ PO TBCR
40.0000 meq | EXTENDED_RELEASE_TABLET | Freq: Once | ORAL | Status: AC
  Administered 2022-09-20: 40 meq via ORAL
  Filled 2022-09-20: qty 2

## 2022-09-20 NOTE — Progress Notes (Signed)
PT Cancellation Note  Patient Details Name: David Townsend MRN: [id number] DOB: 10-04-1960   Cancelled Treatment:    Reason Eval/Treat Not Completed: Other (comment) Pt declined PT this afternoon due to fatigue.  Reports he tried with OT earlier and had a hard time trying to stand and does not feel up for it again at this time.  Requested PT return tomorrow.  Anise Salvo, PT Acute Rehab [facility] [phone]   Rayetta Humphrey 09/20/2022, 5:25 PM

## 2022-09-20 NOTE — Progress Notes (Signed)
Patient educated on importance of foley exchange and patient declined. He said it is too painful and rather not do it. MD Noralee Stain paged and refusal documented.

## 2022-09-20 NOTE — Progress Notes (Signed)
   09/20/22 0703  Assess: MEWS Score  Temp (!) 102.9 F (39.4 C)  BP (!) 159/72  MAP (mmHg) 94  Pulse Rate 97  Resp 18  SpO2 97 %  O2 Device Room Air  Assess: MEWS Score  MEWS Temp 2  MEWS Systolic 0  MEWS Pulse 0  MEWS RR 0  MEWS LOC 0  MEWS Score 2  MEWS Score Color Yellow  Assess: if the MEWS score is Yellow or Red  Were vital signs taken at a resting state? Yes  Focused Assessment No change from prior assessment  Does the patient meet 2 or more of the SIRS criteria? Yes  Does the patient have a confirmed or suspected source of infection? Yes  Provider and Rapid Response Notified? Yes  MEWS guidelines implemented  Yes, yellow  Treat  MEWS Interventions Considered administering scheduled or prn medications/treatments as ordered  Take Vital Signs  Increase Vital Sign Frequency  Yellow: Q2hr x1, continue Q4hrs until patient remains green for 12hrs  Escalate  MEWS: Escalate Yellow: Discuss with charge nurse and consider notifying provider and/or RRT  Notify: Charge Nurse/RN  Name of Charge Nurse/RN Notified Blythe Stanford, RN  Provider Notification  Provider Name/Title Noralee Stain, DO  Date Provider Notified 09/20/22  Time Provider Notified [phone]  Method of Notification Page (secure chat)  Notification Reason Other (Comment) (yellow mews)  Provider response See new orders  Date of Provider Response 09/20/22  Time of Provider Response 0722  Assess: SIRS CRITERIA  SIRS Temperature  1  SIRS Pulse 1  SIRS Respirations  0  SIRS WBC 0  SIRS Score Sum  2

## 2022-09-20 NOTE — Progress Notes (Signed)
PROGRESS NOTE    David Townsend  WUJ:[id number] DOB: Oct 03, 1960 DOA: 09/19/2022 PCP: Rema Fendt, NP     Brief Narrative:  David Townsend is a 62 y.o. 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    Reports a few days ago he felt generalized weakness and fell in the bathroom. Had to crawl to get back into bed. Mostly has been in bed since. Today slipped out of bed and laid on floor for 3 hours unable to get up. States he has abdominal pain but thinks it is due to straining to try and get up off the floor.   In the emergency department, patient was found to have UTI and was started on empiric antibiotic.  New events last 24 hours / Subjective: Patient very weak, states that he has difficulty even sitting up in bed.  Lives at home alone, has a girl who stays with him to help (friend? Girlfriend?).  He does not want his Foley catheter exchanged.  States that it gets exchanged at [facility], states that it is very painful to get it changed out.  Assessment & Plan:   Principal Problem:   Sepsis secondary to UTI Active Problems:   ETOH abuse   Hyperlipidemia   Prostate cancer metastatic to bone   Tobacco abuse   Essential hypertension   Rhabdomyolysis   Hyponatremia   Sepsis secondary to UTI -UTI related with chronic indwelling Foley catheter -Sepsis present on admission with fever, tachycardia -Rocephin -Urine culture is pending -Check blood culture -WBC improving  Fall with generalized weakness -PT OT, suspect patient may need placement in SNF for discharge  Hyponatremia -IV fluid  Hypokalemia -Replace  Rhabdomyolysis -Improving with IV fluid  Metastatic prostate cancer -Followed by Dr. Berneice Heinrich, [facility] -Lavone Neri   Hypertension -Amlodipine,  Avapro  Hyperlipidemia -Lipitor  Alcohol abuse -CIWA protocol, B1, folic acid  Tobacco abuse -Nicotine patch    DVT prophylaxis:  enoxaparin (LOVENOX) injection 40 mg Start: 09/20/22 1000  Code Status: Full code Family Communication: None at bedside Disposition Plan: Home versus SNF pending PT evaluation Status is: Observation The patient will require care spanning > 2 midnights and should be moved to inpatient because: IV antibiotics, IV fluid    Antimicrobials:  Anti-infectives (From admission, onward)    Start     Dose/Rate Route Frequency Ordered Stop   09/20/22 2000  cefTRIAXone (ROCEPHIN) 1 g in sodium chloride 0.9 % 100 mL IVPB        1 g 200 mL/hr over 30 Minutes Intravenous Every 24 hours 09/20/22 1038     09/19/22 2200  ciprofloxacin (CIPRO) IVPB 200 mg  Status:  Discontinued        200 mg 100 mL/hr over 60 Minutes Intravenous Every 12 hours 09/19/22 2133 09/20/22 1038        Objective: Vitals:   09/20/22 0437 09/20/22 0703 09/20/22 0800 09/20/22 1035  BP: 127/65 (!) 159/72 (!) 157/78 115/60  Pulse: 91 97 98 93  Resp: Temp: (!) 101.1 F (38.4 C) (!) 102.9 F (39.4 C) (!) 101.3 F (38.5 C) (!) 100.4 F (38 C)  TempSrc: Oral Oral Oral Axillary  SpO2: 94% 97% 97% 94%  Weight:      Height:        Intake/Output Summary (Last 24 hours) at 09/20/2022 1124 Last data filed at  09/20/2022 1010 Gross per 24 hour  Intake 3107.51 ml  Output 900 ml  Net 2207.51 ml   Filed Weights   09/19/22 1128  Weight: 70.3 kg    Examination:  General exam: Appears calm and comfortable  Respiratory system: Clear to auscultation. Respiratory effort normal. No respiratory distress. No conversational dyspnea.  Cardiovascular system: S1 & S2 heard, RRR. No murmurs. No pedal edema. Gastrointestinal system: Abdomen is nondistended, soft and nontender. Normal bowel sounds heard. Central nervous system: Alert and oriented.  Extremities: Symmetric in appearance   Skin: No rashes, lesions or ulcers on exposed skin  Psychiatry: Judgement and insight appear normal. Mood & affect appropriate.   Data Reviewed: I have personally reviewed following labs and imaging studies  CBC: Recent Labs  Lab 09/19/22 1150 09/20/22 0631  WBC 16.6* 13.9*  NEUTROABS 15.1*  --   HGB 12.8* 11.1*  HCT 35.7* 32.4*  MCV 112.3* 114.1*  PLT 193 158   Basic Metabolic Panel: Recent Labs  Lab 09/19/22 1150 09/20/22 0631  NA 127* 125*  K 3.6 3.4*  CL 91* 93*  CO2 23 20*  GLUCOSE 110* 79  BUN 16 14  CREATININE 0.99 0.85  CALCIUM 9.7 8.7*  MG 1.9  --    GFR: Estimated Creatinine Clearance: 90.7 mL/min (by C-G formula based on SCr of 0.85 mg/dL). Liver Function Tests: Recent Labs  Lab 09/19/22 1244  AST 148*  ALT 36  ALKPHOS 54  BILITOT 0.7  PROT 8.3*  ALBUMIN 4.5   No results for input(s): "LIPASE", "AMYLASE" in the last 168 hours. No results for input(s): "AMMONIA" in the last 168 hours. Coagulation Profile: No results for input(s): "INR", "PROTIME" in the last 168 hours. Cardiac Enzymes: Recent Labs  Lab 09/19/22 1424 09/20/22 0631  CKTOTAL 6,277* 3,688*   BNP (last 3 results) No results for input(s): "PROBNP" in the last 8760 hours. HbA1C: No results for input(s): "HGBA1C" in the last 72 hours. CBG: No results for input(s): "GLUCAP" in the last 168 hours. Lipid Profile: No results for input(s): "CHOL", "HDL", "LDLCALC", "TRIG", "CHOLHDL", "LDLDIRECT" in the last 72 hours. Thyroid Function Tests: No results for input(s): "TSH", "T4TOTAL", "FREET4", "T3FREE", "THYROIDAB" in the last 72 hours. Anemia Panel: No results for input(s): "VITAMINB12", "FOLATE", "FERRITIN", "TIBC", "IRON", "RETICCTPCT" in the last 72 hours. Sepsis Labs: Recent Labs  Lab 09/19/22 1423  LATICACIDVEN 0.8    No results found for this or any previous visit (from the past 240 hour(s)).    Radiology Studies: MR LUMBAR SPINE WO CONTRAST  Result Date:  09/19/2022 CLINICAL DATA:  Bilateral lower extremity weakness, right greater than left getting treatment for prostate cancer. EXAM: MRI THORACIC AND LUMBAR SPINE WITHOUT CONTRAST TECHNIQUE: Multiplanar and multiecho pulse sequences of the thoracic and lumbar spine were obtained without intravenous contrast. COMPARISON:  NM bone scan 07/23/2021, CT 07/12/2021. FINDINGS: MRI THORACIC SPINE FINDINGS Alignment:  Physiologic. Vertebrae: There is diffuse osseous metastatic disease throughout the thoracic spine, and also notable in the sternum. There are areas of marrow edema signal within multiple vertebral bodies but no evidence of acute compression fracture. No evidence of discitis. Cord: No abnormal cord signal. Paraspinal and other soft tissues: Left posterior medial lung signal abnormality/consolidation. Disc levels: Evaluation of the disc levels are demonstrates no large disc herniation, significant spinal canal or neural foraminal narrowing. MRI LUMBAR SPINE FINDINGS Segmentation:  Standard. Alignment:  Physiologic. Vertebrae: Diffuse osseous metastatic disease throughout the lumbar spine and partially visualized sacrum. Areas of marrow edema signal  but no evidence of acute compression fracture. Conus medullaris and cauda equina: Conus extends to the L1 level. Conus and cauda equina appear normal. Paraspinal and other soft tissues: Negative. Disc levels: T12-L1: No significant spinal canal or neural foraminal narrowing. L1-L2: A minimal asymmetric right disc bulging. No significant spinal canal or neural foraminal stenosis. L2-L3: Minimal disc bulging. No significant spinal canal or neural foraminal stenosis. L3-L4: Mild disc bulging, ligament flavum hypertrophy and bilateral facet arthropathy. Mild bilateral subarticular narrowing and mild right-sided neural foraminal narrowing. L4-L5: Broad disc bulging, ligament flavum hypertrophy and bilateral facet arthropathy. There is moderate spinal canal stenosis and  moderate bilateral neural foraminal stenosis. L5-S1: Broad disc bulging, ligament flavum hypertrophy and bilateral facet arthropathy. There is moderate bilateral neural foraminal stenosis. No spinal canal stenosis. Small perineural cyst on the right at S2. IMPRESSION: Diffuse osseous metastatic disease throughout the thoracic spine, lumbar spine, and in the partially visualized sacrum. No evidence of cord compression or abnormal cord signal. No evidence of acute fracture. Multilevel degenerative changes of the lumbar spine. Moderate spinal canal stenosis at L4-L5. Moderate bilateral neural foraminal stenosis at L4-L5 and L5-S1. Left posterior medial lung signal abnormality/consolidation, could reflect pneumonia. Recommend correlation with chest CT. Electronically Signed   By: Caprice Renshaw M.D.   On: 09/19/2022 18:55   MR THORACIC SPINE WO CONTRAST  Result Date: 09/19/2022 CLINICAL DATA:  Bilateral lower extremity weakness, right greater than left getting treatment for prostate cancer. EXAM: MRI THORACIC AND LUMBAR SPINE WITHOUT CONTRAST TECHNIQUE: Multiplanar and multiecho pulse sequences of the thoracic and lumbar spine were obtained without intravenous contrast. COMPARISON:  NM bone scan 07/23/2021, CT 07/12/2021. FINDINGS: MRI THORACIC SPINE FINDINGS Alignment:  Physiologic. Vertebrae: There is diffuse osseous metastatic disease throughout the thoracic spine, and also notable in the sternum. There are areas of marrow edema signal within multiple vertebral bodies but no evidence of acute compression fracture. No evidence of discitis. Cord: No abnormal cord signal. Paraspinal and other soft tissues: Left posterior medial lung signal abnormality/consolidation. Disc levels: Evaluation of the disc levels are demonstrates no large disc herniation, significant spinal canal or neural foraminal narrowing. MRI LUMBAR SPINE FINDINGS Segmentation:  Standard. Alignment:  Physiologic. Vertebrae: Diffuse osseous metastatic  disease throughout the lumbar spine and partially visualized sacrum. Areas of marrow edema signal but no evidence of acute compression fracture. Conus medullaris and cauda equina: Conus extends to the L1 level. Conus and cauda equina appear normal. Paraspinal and other soft tissues: Negative. Disc levels: T12-L1: No significant spinal canal or neural foraminal narrowing. L1-L2: A minimal asymmetric right disc bulging. No significant spinal canal or neural foraminal stenosis. L2-L3: Minimal disc bulging. No significant spinal canal or neural foraminal stenosis. L3-L4: Mild disc bulging, ligament flavum hypertrophy and bilateral facet arthropathy. Mild bilateral subarticular narrowing and mild right-sided neural foraminal narrowing. L4-L5: Broad disc bulging, ligament flavum hypertrophy and bilateral facet arthropathy. There is moderate spinal canal stenosis and moderate bilateral neural foraminal stenosis. L5-S1: Broad disc bulging, ligament flavum hypertrophy and bilateral facet arthropathy. There is moderate bilateral neural foraminal stenosis. No spinal canal stenosis. Small perineural cyst on the right at S2. IMPRESSION: Diffuse osseous metastatic disease throughout the thoracic spine, lumbar spine, and in the partially visualized sacrum. No evidence of cord compression or abnormal cord signal. No evidence of acute fracture. Multilevel degenerative changes of the lumbar spine. Moderate spinal canal stenosis at L4-L5. Moderate bilateral neural foraminal stenosis at L4-L5 and L5-S1. Left posterior medial lung signal abnormality/consolidation, could reflect pneumonia.  Recommend correlation with chest CT. Electronically Signed   By: Caprice Renshaw M.D.   On: 09/19/2022 18:55   CT Head Wo Contrast  Result Date: 09/19/2022 CLINICAL DATA:  Altered mental status. EXAM: CT HEAD WITHOUT CONTRAST TECHNIQUE: Contiguous axial images were obtained from the base of the skull through the vertex without intravenous contrast.  RADIATION DOSE REDUCTION: This exam was performed according to the departmental dose-optimization program which includes automated exposure control, adjustment of the mA and/or kV according to patient size and/or use of iterative reconstruction technique. COMPARISON:  None Available. FINDINGS: Brain: Mild age-related atrophy and chronic microvascular ischemic changes. There is no acute intracranial hemorrhage. No mass effect or midline shift. No extra-axial fluid collection. Vascular: No hyperdense vessel or unexpected calcification. Skull: No acute calvarial pathology. Sclerotic osseous metastatic disease in keeping with known metastatic prostate cancer. Sinuses/Orbits: Mild mucoperiosteal thickening of paranasal sinuses. The mastoid air cells are clear. Other: None IMPRESSION: 1. No acute intracranial pathology. 2. Mild age-related atrophy and chronic microvascular ischemic changes. 3. 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 Electronically Signed   By: Elgie Collard M.D.   On: 09/19/2022 18:39   DG Chest Portable 1 View  Result Date: 09/19/2022 CLINICAL DATA:  cough, weakness EXAM: PORTABLE CHEST - 1 VIEW COMPARISON:  04/29/2018 FINDINGS: Cardiac silhouette is unremarkable. No pneumothorax or pleural effusion. The lungs are clear. The visualized skeletal structures are unremarkable. IMPRESSION: No acute cardiopulmonary process. Electronically Signed   By: Layla Maw M.D.   On: 09/19/2022 12:35      Scheduled Meds:  amLODipine  10 mg Oral Daily   apalutamide  240 mg Oral Daily   atorvastatin  40 mg Oral Daily   Chlorhexidine Gluconate Cloth  6 each Topical Daily   enoxaparin (LOVENOX) injection  40 mg Subcutaneous Q24H   folic acid  1 mg Oral Daily   irbesartan  300 mg Oral Daily   multivitamin with minerals  1 tablet Oral Daily   nicotine  21 mg Transdermal Daily   thiamine  100 mg Oral Daily   Or   thiamine  100 mg Intravenous Daily   Continuous Infusions:  sodium chloride 125 mL/hr at  09/20/22 0725   cefTRIAXone (ROCEPHIN)  IV       LOS: 0 days   Time spent: 25 minutes   Noralee Stain, DO [facility] 09/20/2022, 11:24 AM   Available via Epic secure chat 7am-7pm After these hours, please refer to coverage provider listed on [email or web address]

## 2022-09-20 NOTE — Evaluation (Signed)
Occupational Therapy Evaluation Patient Details Name: David Townsend MRN: [id number] DOB: 14-Oct-1960 Today's Date: 09/20/2022   History of Present Illness 62 y.o. 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 Sepsis secondary to UTI   Clinical Impression   Patient admitted for the diagnosis above.  PTA he lives at home, and has a live in friend who has been providing supportive assist until recently, patient has needed assist post fall to get up.  Very weak, unsteady and slowed mentation.  PTA patient states he walked without an AD, and remained Ind with ADL completion.  He has no real DME at home.  Currently he is needing up to Mod A for simple transfers, and Max A for lower body ADL.  OT is indicated in the acute setting to address deficits, and Patient will benefit from continued inpatient follow up therapy, <3 hours/day.  Patient would like to try for [facility] rehab, but will depend on progress.         Recommendations for follow up therapy are one component of a multi-disciplinary discharge planning process, led by the attending physician.  Recommendations may be updated based on patient status, additional functional criteria and insurance authorization.   Assistance Recommended at Discharge Frequent or constant Supervision/Assistance  Patient can return home with the following Assist for transportation;Help with stairs or ramp for entrance;Assistance with cooking/housework;A lot of help with walking and/or transfers;A lot of help with bathing/dressing/bathroom;Direct supervision/assist for medications management    Functional Status Assessment  Patient has had a recent decline in their functional status and demonstrates the ability to make significant improvements in function in a reasonable and predictable amount of time.   Equipment Recommendations  BSC/3in1;Tub/shower seat    Recommendations for Other Services       Precautions / Restrictions Precautions Precautions: Fall Precaution Comments: foley Restrictions Weight Bearing Restrictions: No      Mobility Bed Mobility Overal bed mobility: Needs Assistance Bed Mobility: Supine to Sit, Sit to Supine     Supine to sit: Min assist, Mod assist Sit to supine: Min assist, Mod assist   General bed mobility comments: assist with legs and bringing hips to the edge    Transfers Overall transfer level: Needs assistance Equipment used: Rolling walker (2 wheels) Transfers: Sit to/from Stand, Bed to chair/wheelchair/BSC Sit to Stand: Mod assist     Step pivot transfers: Mod assist     General transfer comment: balance back      Balance Overall balance assessment: Needs assistance Sitting-balance support: Feet supported, Bilateral upper extremity supported Sitting balance-Leahy Scale: Fair     Standing balance support: Reliant on assistive device for balance Standing balance-Leahy Scale: Poor                             ADL either performed or assessed with clinical judgement   ADL Overall ADL's : Needs assistance/impaired Eating/Feeding: Set up;Bed level   Grooming: Wash/dry hands;Wash/dry face;Min guard;Sitting   Upper Body Bathing: Minimal assistance;Sitting   Lower Body Bathing: Maximal assistance;Bed level   Upper Body Dressing : Minimal assistance;Sitting   Lower Body Dressing: Maximal assistance;Bed level   Toilet Transfer: Moderate assistance;BSC/3in1;Stand-pivot;Rolling walker (2 wheels)   Toileting- Clothing Manipulation and Hygiene: Total assistance;Sit to/from stand  Vision Patient Visual Report: No change from baseline       Perception     Praxis      Pertinent Vitals/Pain Pain Assessment Pain Assessment: No/denies pain     Hand Dominance Right   Extremity/Trunk  Assessment Upper Extremity Assessment Upper Extremity Assessment: Generalized weakness   Lower Extremity Assessment Lower Extremity Assessment: Defer to PT evaluation   Cervical / Trunk Assessment Cervical / Trunk Assessment: Kyphotic   Communication Communication Communication: No difficulties   Cognition Arousal/Alertness: Awake/alert Behavior During Therapy: Flat affect Overall Cognitive Status: Impaired/Different from baseline Area of Impairment: Problem solving, Following commands, Safety/judgement, Awareness                       Following Commands: Follows one step commands consistently   Awareness: Emergent Problem Solving: Slow processing, Decreased initiation, Requires verbal cues       General Comments   VSS on RA    Exercises     Shoulder Instructions      Home Living Family/patient expects to be discharged to:: Private residence Living Arrangements: Non-relatives/Friends Available Help at Discharge: Available 24 hours/day Type of Home: House Home Access: Stairs to enter [employer] of Steps: 4 Entrance Stairs-Rails: Right;Left Home Layout: One level     Bathroom Shower/Tub: Tub/shower unit;Walk-in shower   Bathroom Toilet: Standard Bathroom Accessibility: Yes How Accessible: Accessible via walker Home Equipment: None          Prior Functioning/Environment Prior Level of Function : Independent/Modified Independent;History of Falls (last six months)             Mobility Comments: Per patient, no AD needed. ADLs Comments: Per patient: Ind with ADL, has live in friend that assists with driving and iADL        OT Problem List: Decreased strength;Decreased activity tolerance;Impaired balance (sitting and/or standing);Decreased knowledge of use of DME or AE;Decreased safety awareness      OT Treatment/Interventions: Self-care/ADL training;Therapeutic exercise;Therapeutic activities;Cognitive  remediation/compensation;Patient/family education;Balance training;DME and/or AE instruction;Energy conservation    OT Goals(Current goals can be found in the care plan section) Acute Rehab OT Goals Patient Stated Goal: Go home OT Goal Formulation: With patient Time For Goal Achievement: 10/04/22 Potential to Achieve Goals: Fair ADL Goals Pt Will Perform Grooming: with set-up;sitting Pt Will Perform Upper Body Bathing: with supervision;sitting Pt Will Perform Lower Body Bathing: with min assist;sitting/lateral leans Pt Will Perform Upper Body Dressing: with supervision;sitting Pt Will Perform Lower Body Dressing: with min assist;sitting/lateral leans Pt Will Transfer to Toilet: with min assist;stand pivot transfer;bedside commode Pt/caregiver will Perform Home Exercise Program: Increased strength;Both right and left upper extremity;With theraband;With Supervision  OT Frequency: Min 2X/week    Co-evaluation              AM-PAC OT "6 Clicks" Daily Activity     Outcome Measure Help from another person eating meals?: None Help from another person taking care of personal grooming?: None Help from another person toileting, which includes using toliet, bedpan, or urinal?: A Lot Help from another person bathing (including washing, rinsing, drying)?: A Lot Help from another person to put on and taking off regular upper body clothing?: A Little Help from another person to put on and taking off regular lower body clothing?: A Lot 6 Click Score: 17   End of Session Equipment Utilized During Treatment: Gait belt;Rolling walker (2 wheels) Nurse Communication: Mobility status  Activity Tolerance: Patient tolerated treatment well Patient left: in bed;with call bell/phone  within reach;with bed alarm set;with family/visitor present  OT Visit Diagnosis: Unsteadiness on feet (R26.81);Muscle weakness (generalized) (M62.81);History of falling (Z91.81)                Time: 1610-9604 OT Time  Calculation (min): 22 min Charges:  OT General Charges $OT Visit: 1 Visit OT Evaluation $OT Eval Moderate Complexity: 1 Mod  09/20/2022  RP, OTR/L  Acute Rehabilitation Services  Office:  [phone]   Suzanna Obey 09/20/2022, 1:38 PM

## 2022-09-21 DIAGNOSIS — A419 Sepsis, unspecified organism: Secondary | ICD-10-CM | POA: Diagnosis not present

## 2022-09-21 DIAGNOSIS — N390 Urinary tract infection, site not specified: Secondary | ICD-10-CM | POA: Diagnosis not present

## 2022-09-21 LAB — BASIC METABOLIC PANEL
Anion gap: 14 (ref 5–15)
BUN: 13 mg/dL (ref 8–23)
CO2: 15 mmol/L — ABNORMAL LOW (ref 22–32)
Calcium: 8.3 mg/dL — ABNORMAL LOW (ref 8.9–10.3)
Chloride: 94 mmol/L — ABNORMAL LOW (ref 98–111)
Creatinine, Ser: 0.74 mg/dL (ref 0.61–1.24)
GFR, Estimated: >60 mL/min (ref >60)
Glucose, Bld: 74 mg/dL (ref 70–99)
Potassium: 3.3 mmol/L — ABNORMAL LOW (ref 3.5–5.1)
Sodium: 123 mmol/L — ABNORMAL LOW (ref 135–145)

## 2022-09-21 LAB — OSMOLALITY: Osmolality: 273 mOsm/kg — ABNORMAL LOW (ref 275–295)

## 2022-09-21 LAB — CBC
HCT: 32.5 % — ABNORMAL LOW (ref 39.0–52.0)
Hemoglobin: 11.3 g/dL — ABNORMAL LOW (ref 13.0–17.0)
MCH: 39.9 pg — ABNORMAL HIGH (ref 26.0–34.0)
MCHC: 34.8 g/dL (ref 30.0–36.0)
MCV: 114.8 fL — ABNORMAL HIGH (ref 80.0–100.0)
Platelets: 153 10*3/uL (ref 150–400)
RBC: 2.83 MIL/uL — ABNORMAL LOW (ref 4.22–5.81)
RDW: 12 % (ref 11.5–15.5)
WBC: 9.3 10*3/uL (ref 4.0–10.5)
nRBC: 0 % (ref 0.0–0.2)

## 2022-09-21 LAB — MAGNESIUM: Magnesium: 1.7 mg/dL (ref 1.7–2.4)

## 2022-09-21 LAB — CULTURE, BLOOD (ROUTINE X 2): Special Requests: ADEQUATE

## 2022-09-21 MED ORDER — GUAIFENESIN-DM 100-10 MG/5ML PO SYRP
5.0000 mL | ORAL_SOLUTION | ORAL | Status: DC | PRN
  Administered 2022-09-21 – 2022-09-23 (×5): 5 mL via ORAL
  Filled 2022-09-21 (×6): qty 5
  Filled 2022-09-21: qty 10

## 2022-09-21 MED ORDER — POTASSIUM CHLORIDE CRYS ER 20 MEQ PO TBCR
40.0000 meq | EXTENDED_RELEASE_TABLET | Freq: Once | ORAL | Status: AC
  Administered 2022-09-21: 40 meq via ORAL
  Filled 2022-09-21: qty 2

## 2022-09-21 NOTE — Progress Notes (Signed)
PROGRESS NOTE    David Townsend  ZOX:[id number] DOB: 13-Jul-1960 DOA: 09/19/2022 PCP: Rema Fendt, NP     Brief Narrative:  David Townsend is a 62 y.o. 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    Reports a few days ago he felt generalized weakness and fell in the bathroom. Had to crawl to get back into bed. Mostly has been in bed since. Today slipped out of bed and laid on floor for 3 hours unable to get up. States he has abdominal pain but thinks it is due to straining to try and get up off the floor.   In the emergency department, patient was found to have UTI and was started on empiric antibiotic.  His Foley catheter was not exchanged, patient declined.  New events last 24 hours / Subjective: Patient with fever one 101.4 this morning.  WBC continues to improve.  He is unsure if he wants to go to SNF, wants to see if he can go home with home health instead.  However, he also tells me that with occupational therapy, he was only able to take 2 steps using a walker.  Assessment & Plan:   Principal Problem:   Sepsis secondary to UTI Active Problems:   ETOH abuse   Hyperlipidemia   Prostate cancer metastatic to bone   Tobacco abuse   Essential hypertension   Rhabdomyolysis   Hyponatremia   Sepsis secondary to UTI -UTI related with chronic indwelling Foley catheter -Sepsis present on admission with fever, tachycardia -Rocephin -Urine culture pending -Blood culture pending -Leukocytosis resolved  Fall with generalized weakness -PT OT, suspect patient may need placement in SNF for discharge  Hyponatremia -IV fluid -Check urine osmole, urine sodium, serum osmole  Hypokalemia -Replace  Rhabdomyolysis -Improved with IV fluid  Metastatic prostate cancer -Followed by Dr. Berneice Heinrich[facility] -Lavone Neri   Hypertension -Amlodipine, Avapro  Hyperlipidemia -Lipitor  Alcohol abuse -CIWA protocol, B1, folic acid  Tobacco abuse -Nicotine patch    DVT prophylaxis:  enoxaparin (LOVENOX) injection 40 mg Start: 09/20/22 1000  Code Status: Full code Family Communication: None at bedside Disposition Plan: Home versus SNF pending PT evaluation Status is: Inpatient Remains inpatient appropriate because: IV antibiotics, IV fluid      Antimicrobials:  Anti-infectives (From admission, onward)    Start     Dose/Rate Route Frequency Ordered Stop   09/20/22 2000  cefTRIAXone (ROCEPHIN) 1 g in sodium chloride 0.9 % 100 mL IVPB        1 g 200 mL/hr over 30 Minutes Intravenous Every 24 hours 09/20/22 1038     09/19/22 2200  ciprofloxacin (CIPRO) IVPB 200 mg  Status:  Discontinued        200 mg 100 mL/hr over 60 Minutes Intravenous Every 12 hours 09/19/22 2133 09/20/22 1038        Objective: Vitals:   09/20/22 1607 09/20/22 1726 09/21/22 0024 09/21/22 0517  BP: (!) 161/84  (!) 152/63 (!) 142/80  Pulse: (!) 101  95 92  Resp: 18  18 18   Temp: (!) 100.6 F (38.1 C) 99.2 F (37.3 C) 100 F (37.8 C) (!) 101.4 F (38.6 C)  TempSrc: Oral Oral  Oral  SpO2: 95%  96% 92%  Weight:      Height:        Intake/Output Summary (Last 24 hours) at 09/21/2022 1224 Last  data filed at 09/21/2022 0940 [place] per 24 hour  Intake 2699.03 ml  Output 2700 ml  Net -0.97 ml    Filed Weights   09/19/22 1128  Weight: 70.3 kg    Examination:  General exam: Appears calm and comfortable  Respiratory system: Clear to auscultation. Respiratory effort normal. No respiratory distress. No conversational dyspnea.  Cardiovascular system: S1 & S2 heard, RRR. No murmurs. No pedal edema. Gastrointestinal system: Abdomen is nondistended, soft and nontender. Normal bowel sounds heard. Central nervous system: Alert and oriented.  Extremities: Symmetric in appearance  Skin: No rashes,  lesions or ulcers on exposed skin  Psychiatry: Judgement and insight appear normal. Mood & affect appropriate.   Data Reviewed: I have personally reviewed following labs and imaging studies  CBC: Recent Labs  Lab 09/19/22 1150 09/20/22 0631 09/21/22 0543  WBC 16.6* 13.9* 9.3  NEUTROABS 15.1*  --   --   HGB 12.8* 11.1* 11.3*  HCT 35.7* 32.4* 32.5*  MCV 112.3* 114.1* 114.8*  PLT 193 158 153    Basic Metabolic Panel: Recent Labs  Lab 09/19/22 1150 09/20/22 0631 09/21/22 0543  NA 127* 125* 123*  K 3.6 3.4* 3.3*  CL 91* 93* 94*  CO2 23 20* 15*  GLUCOSE 110* 79 74  BUN CREATININE 0.99 0.85 0.74  CALCIUM 9.7 8.7* 8.3*  MG 1.9  --  1.7    GFR: Estimated Creatinine Clearance: 96.4 mL/min (by C-G formula based on SCr of 0.74 mg/dL). Liver Function Tests: Recent Labs  Lab 09/19/22 1244  AST 148*  ALT 36  ALKPHOS 54  BILITOT 0.7  PROT 8.3*  ALBUMIN 4.5    No results for input(s): "LIPASE", "AMYLASE" in the last 168 hours. No results for input(s): "AMMONIA" in the last 168 hours. Coagulation Profile: No results for input(s): "INR", "PROTIME" in the last 168 hours. Cardiac Enzymes: Recent Labs  Lab 09/19/22 1424 09/20/22 0631  CKTOTAL 6,277* 3,688*    BNP (last 3 results) No results for input(s): "PROBNP" in the last 8760 hours. HbA1C: No results for input(s): "HGBA1C" in the last 72 hours. CBG: No results for input(s): "GLUCAP" in the last 168 hours. Lipid Profile: No results for input(s): "CHOL", "HDL", "LDLCALC", "TRIG", "CHOLHDL", "LDLDIRECT" in the last 72 hours. Thyroid Function Tests: No results for input(s): "TSH", "T4TOTAL", "FREET4", "T3FREE", "THYROIDAB" in the last 72 hours. Anemia Panel: No results for input(s): "VITAMINB12", "FOLATE", "FERRITIN", "TIBC", "IRON", "RETICCTPCT" in the last 72 hours. Sepsis Labs: Recent Labs  Lab 09/19/22 1423  LATICACIDVEN 0.8     Recent Results (from the past 240 hour(s))  Culture, Urine (Do  not remove urinary catheter, catheter placed by urology or difficult to place)     Status: Abnormal (Preliminary result)   Collection Time: 09/19/22 11:50 AM   Specimen: Urine, Catheterized  Result Value Ref Range Status   Specimen Description   Final    URINE, CATHETERIZED Performed at [facility], [address] [place], [place] [address]    Special Requests   Final    NONE Performed at [facility], [address], [place], [place] [address]    Culture (A)  Final    >=100,000 COLONIES/mL GRAM NEGATIVE RODS SUSCEPTIBILITIES TO FOLLOW Performed at [facility], [address] [place], [place] [address]    Report Status PENDING  Incomplete  Culture, blood (Routine X 2) w Reflex to ID Panel     Status: None (Preliminary result)  Collection Time: 09/20/22 11:48 AM   Specimen: BLOOD  Result Value Ref Range Status   Specimen Description   Final    BLOOD BLOOD RIGHT ARM AEROBIC BOTTLE ONLY Performed at [facility], [address] [place], [place] [address]    Special Requests   Final    BOTTLES DRAWN AEROBIC ONLY Blood Culture adequate volume Performed at [facility], [address], [place], [place] [address]    Culture   Final    NO GROWTH < 24 HOURS Performed at [facility], [address] [place], [place] [address]    Report Status PENDING  Incomplete  Culture, blood (Routine X 2) w Reflex to ID Panel     Status: None (Preliminary result)   Collection Time: 09/20/22 11:48 AM   Specimen: BLOOD  Result Value Ref Range Status   Specimen Description   Final    BLOOD BLOOD RIGHT HAND Performed at [facility], [address], [place], [place] [address]    Special Requests   Final    BOTTLES DRAWN AEROBIC ONLY Blood Culture adequate volume Performed at [facility], [address], [place], [place] [address]    Culture   Final    NO GROWTH <  24 HOURS Performed at [facility], [address], [place], [place] [address]    Report Status PENDING  Incomplete      Radiology Studies: MR LUMBAR SPINE WO CONTRAST  Result Date: 09/19/2022 CLINICAL DATA:  Bilateral lower extremity weakness, right greater than left getting treatment for prostate cancer. EXAM: MRI THORACIC AND LUMBAR SPINE WITHOUT CONTRAST TECHNIQUE: Multiplanar and multiecho pulse sequences of the thoracic and lumbar spine were obtained without intravenous contrast. COMPARISON:  NM bone scan 07/23/2021, CT 07/12/2021. FINDINGS: MRI THORACIC SPINE FINDINGS Alignment:  Physiologic. Vertebrae: There is diffuse osseous metastatic disease throughout the thoracic spine, and also notable in the sternum. There are areas of marrow edema signal within multiple vertebral bodies but no evidence of acute compression fracture. No evidence of discitis. Cord: No abnormal cord signal. Paraspinal and other soft tissues: Left posterior medial lung signal abnormality/consolidation. Disc levels: Evaluation of the disc levels are demonstrates no large disc herniation, significant spinal canal or neural foraminal narrowing. MRI LUMBAR SPINE FINDINGS Segmentation:  Standard. Alignment:  Physiologic. Vertebrae: Diffuse osseous metastatic disease throughout the lumbar spine and partially visualized sacrum. Areas of marrow edema signal but no evidence of acute compression fracture. Conus medullaris and cauda equina: Conus extends to the L1 level. Conus and cauda equina appear normal. Paraspinal and other soft tissues: Negative. Disc levels: T12-L1: No significant spinal canal or neural foraminal narrowing. L1-L2: A minimal asymmetric right disc bulging. No significant spinal canal or neural foraminal stenosis. L2-L3: Minimal disc bulging. No significant spinal canal or neural foraminal stenosis. L3-L4: Mild disc bulging, ligament flavum hypertrophy and bilateral facet arthropathy. Mild bilateral  subarticular narrowing and mild right-sided neural foraminal narrowing. L4-L5: Broad disc bulging, ligament flavum hypertrophy and bilateral facet arthropathy. There is moderate spinal canal stenosis and moderate bilateral neural foraminal stenosis. L5-S1: Broad disc bulging, ligament flavum hypertrophy and bilateral facet arthropathy. There is moderate bilateral neural foraminal stenosis. No spinal canal stenosis. Small perineural cyst on the right at S2. IMPRESSION: Diffuse osseous metastatic disease throughout the thoracic spine, lumbar spine, and in the partially visualized sacrum. No evidence of cord compression or abnormal cord signal. No evidence of acute fracture. Multilevel degenerative changes of the  lumbar spine. Moderate spinal canal stenosis at L4-L5. Moderate bilateral neural foraminal stenosis at L4-L5 and L5-S1. Left posterior medial lung signal abnormality/consolidation, could reflect pneumonia. Recommend correlation with chest CT. Electronically Signed   By: Caprice Renshaw M.D.   On: 09/19/2022 18:55   MR THORACIC SPINE WO CONTRAST  Result Date: 09/19/2022 CLINICAL DATA:  Bilateral lower extremity weakness, right greater than left getting treatment for prostate cancer. EXAM: MRI THORACIC AND LUMBAR SPINE WITHOUT CONTRAST TECHNIQUE: Multiplanar and multiecho pulse sequences of the thoracic and lumbar spine were obtained without intravenous contrast. COMPARISON:  NM bone scan 07/23/2021, CT 07/12/2021. FINDINGS: MRI THORACIC SPINE FINDINGS Alignment:  Physiologic. Vertebrae: There is diffuse osseous metastatic disease throughout the thoracic spine, and also notable in the sternum. There are areas of marrow edema signal within multiple vertebral bodies but no evidence of acute compression fracture. No evidence of discitis. Cord: No abnormal cord signal. Paraspinal and other soft tissues: Left posterior medial lung signal abnormality/consolidation. Disc levels: Evaluation of the disc levels are  demonstrates no large disc herniation, significant spinal canal or neural foraminal narrowing. MRI LUMBAR SPINE FINDINGS Segmentation:  Standard. Alignment:  Physiologic. Vertebrae: Diffuse osseous metastatic disease throughout the lumbar spine and partially visualized sacrum. Areas of marrow edema signal but no evidence of acute compression fracture. Conus medullaris and cauda equina: Conus extends to the L1 level. Conus and cauda equina appear normal. Paraspinal and other soft tissues: Negative. Disc levels: T12-L1: No significant spinal canal or neural foraminal narrowing. L1-L2: A minimal asymmetric right disc bulging. No significant spinal canal or neural foraminal stenosis. L2-L3: Minimal disc bulging. No significant spinal canal or neural foraminal stenosis. L3-L4: Mild disc bulging, ligament flavum hypertrophy and bilateral facet arthropathy. Mild bilateral subarticular narrowing and mild right-sided neural foraminal narrowing. L4-L5: Broad disc bulging, ligament flavum hypertrophy and bilateral facet arthropathy. There is moderate spinal canal stenosis and moderate bilateral neural foraminal stenosis. L5-S1: Broad disc bulging, ligament flavum hypertrophy and bilateral facet arthropathy. There is moderate bilateral neural foraminal stenosis. No spinal canal stenosis. Small perineural cyst on the right at S2. IMPRESSION: Diffuse osseous metastatic disease throughout the thoracic spine, lumbar spine, and in the partially visualized sacrum. No evidence of cord compression or abnormal cord signal. No evidence of acute fracture. Multilevel degenerative changes of the lumbar spine. Moderate spinal canal stenosis at L4-L5. Moderate bilateral neural foraminal stenosis at L4-L5 and L5-S1. Left posterior medial lung signal abnormality/consolidation, could reflect pneumonia. Recommend correlation with chest CT. Electronically Signed   By: Caprice Renshaw M.D.   On: 09/19/2022 18:55   CT Head Wo Contrast  Result Date:  09/19/2022 CLINICAL DATA:  Altered mental status. EXAM: CT HEAD WITHOUT CONTRAST TECHNIQUE: Contiguous axial images were obtained from the base of the skull through the vertex without intravenous contrast. RADIATION DOSE REDUCTION: This exam was performed according to the departmental dose-optimization program which includes automated exposure control, adjustment of the mA and/or kV according to patient size and/or use of iterative reconstruction technique. COMPARISON:  None Available. FINDINGS: Brain: Mild age-related atrophy and chronic microvascular ischemic changes. There is no acute intracranial hemorrhage. No mass effect or midline shift. No extra-axial fluid collection. Vascular: No hyperdense vessel or unexpected calcification. Skull: No acute calvarial pathology. Sclerotic osseous metastatic disease in keeping with known metastatic prostate cancer. Sinuses/Orbits: Mild mucoperiosteal thickening of paranasal sinuses. The mastoid air cells are clear. Other: None IMPRESSION: 1. No acute intracranial pathology. 2. Mild age-related atrophy and chronic microvascular ischemic changes. 3. 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 Electronically Signed   By: Elgie Collard M.D.   On: 09/19/2022 18:39   DG Chest Portable 1 View  Result Date: 09/19/2022 CLINICAL DATA:  cough, weakness EXAM: PORTABLE CHEST - 1 VIEW COMPARISON:  04/29/2018 FINDINGS: Cardiac silhouette is unremarkable. No pneumothorax or pleural effusion. The lungs are clear. The visualized skeletal structures are unremarkable. IMPRESSION: No acute cardiopulmonary process. Electronically Signed   By: Layla Maw M.D.   On: 09/19/2022 12:35      Scheduled Meds:  amLODipine  10 mg Oral Daily   atorvastatin  40 mg Oral Daily   Chlorhexidine Gluconate Cloth  6 each Topical Daily   enoxaparin (LOVENOX) injection  40 mg Subcutaneous Q24H   folic acid  1 mg Oral Daily   irbesartan  300 mg Oral Daily   multivitamin with minerals  1 tablet Oral  Daily   nicotine  21 mg Transdermal Daily   thiamine  100 mg Oral Daily   Or   thiamine  100 mg Intravenous Daily   Continuous Infusions:  sodium chloride 125 mL/hr at 09/21/22 0400   cefTRIAXone (ROCEPHIN)  IV Stopped (09/21/22 0347)     LOS: 1 day   Time spent: 25 minutes   Noralee Stain, DO [facility] 09/21/2022, 12:24 PM   Available via Epic secure chat 7am-7pm After these hours, please refer to coverage provider listed on [email or web address]

## 2022-09-21 NOTE — TOC Progression Note (Addendum)
Transition of Care [facility]) - Progression Note    Patient Details  Name: Gregroy Dombkowski MRN: [id number] Date of Birth: 1961-05-31  Transition of Care [facility]) CM/SW Contact  Beckie Busing, RN Phone Number:[phone]  09/21/2022, 12:21 PM  Clinical Narrative:     Transition of Care [facility]) Screening Note   Patient Details  Name: Nicolus Ose Date of Birth: [birth year]   Transition of Care [facility]) CM/SW Contact:    Beckie Busing, RN Phone Number: 09/21/2022, 12:21 PM    Transition of Care Department [facility]) has reviewed patient and no TOC needs have been identified at this time. We will continue to monitor patient advancement through interdisciplinary progression rounds. If new patient transition needs arise, please place a TOC consult.  1222 TOC acknowledges consult for substance abuse counseling.           Expected Discharge Plan and Services                                               Social Determinants of Health (SDOH) Interventions SDOH Screenings   Depression (PHQ2-9): Low Risk  (08/22/2022)  Tobacco Use: High Risk (09/19/2022)    Readmission Risk Interventions     No data to display

## 2022-09-21 NOTE — TOC CAGE-AID Note (Signed)
Transition of Care [facility]) - CAGE-AID Screening   Patient Details  Name: David Townsend MRN: [id number] Date of Birth: 03-17-1961  Transition of Care [facility]) CM/SW Contact:    Beckie Busing, RN Phone [phone]  09/21/2022, 1:40 PM   Clinical Narrative: Patient refused CAGE aid and substance abuse resources.    CAGE-AID Screening: Substance Abuse Screening unable to be completed due to: : Patient Refused             Substance Abuse Education Offered: Yes  Substance abuse interventions: Other (must comment) (Patient refused)

## 2022-09-21 NOTE — Evaluation (Signed)
Physical Therapy Evaluation Patient Details Name: David Townsend MRN: [id number] DOB: 22-Jul-1960 Today's Date: 09/21/2022  History of Present Illness  Pt is 62 yo male presented on 09/19/22 after a fall and generalized weakness.  Pt admitted with sepsis secondary to UTI and rhabdomyolysis.  Additionally, pt with ETOH abuse an on CIWA protocol. 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  Clinical Impression  Pt admitted with above diagnosis. At baseline pt reports independent and active. He reports he has assist at home if needed.  Today, pt was min A for transfers but required mod cues for technique and balance.  He ambulated 15' in room but again min A balance and mod cues for safety.  Pt does have some mild agitation and poor safety awareness.  Pt currently with functional limitations due to the deficits listed below (see PT Problem List). Pt will benefit from acute skilled PT to increase their independence and safety with mobility to allow discharge.  At this time patient will benefit from continued inpatient follow up therapy, <3 hours/day after d/c; however, he has good rehab potential and possibility to progress to lower level of care.           Recommendations for follow up therapy are one component of a multi-disciplinary discharge planning process, led by the attending physician.  Recommendations may be updated based on patient status, additional functional criteria and insurance authorization.  Follow Up Recommendations Can patient physically be transported by private vehicle: Yes     Assistance Recommended at Discharge Frequent or constant Supervision/Assistance  Patient can return home with the following  A little help with walking and/or transfers;A little help with bathing/dressing/bathroom;Assistance with cooking/housework;Help with stairs or ramp for  entrance    Equipment Recommendations Rolling walker (2 wheels)  Recommendations for Other Services       Functional Status Assessment Patient has had a recent decline in their functional status and demonstrates the ability to make significant improvements in function in a reasonable and predictable amount of time.     Precautions / Restrictions Precautions Precautions: Fall      Mobility  Bed Mobility Overal bed mobility: Needs Assistance Bed Mobility: Supine to Sit, Sit to Supine     Supine to sit: Min assist Sit to supine: Min guard   General bed mobility comments: Increased time with assist and cues to scoot forward to sit.  Min guard for lines back to bed    Transfers Overall transfer level: Needs assistance Equipment used: Rolling walker (2 wheels) Transfers: Sit to/from Stand Sit to Stand: Min assist, +2 safety/equipment           General transfer comment: Cues for hand placement and min A to stand at EOB.  Had +2 for safety but likely not needed in future treatments    Ambulation/Gait Ambulation/Gait assistance: Min assist Gait Distance (Feet): 15 Feet Assistive device: Rolling walker (2 wheels) Gait Pattern/deviations: Step-to pattern, Decreased stride length, Shuffle Gait velocity: decreased     General Gait Details: Pt with short shuffle steps but improved some during session.  Tendency for posterior lean.  Cues for RW use and safety with turns.  Stairs            Wheelchair Mobility    Modified Rankin (Stroke Patients Only)       Balance Overall balance assessment: Needs assistance Sitting-balance support: Feet supported, Bilateral upper extremity  supported, No upper extremity supported Sitting balance-Leahy Scale: Fair Sitting balance - Comments: Pt could sit without UE support but tendency for posterior lean during dynamic task requiring use of UE and min guard. Postural control: Posterior lean Standing balance support: Reliant on  assistive device for balance, Bilateral upper extremity supported Standing balance-Leahy Scale: Poor Standing balance comment: Requiring RW and min guard                             Pertinent Vitals/Pain Pain Assessment Pain Assessment: No/denies pain    Home Living Family/patient expects to be discharged to:: Private residence Living Arrangements: Non-relatives/Friends (can provide some physical support) Available Help at Discharge: Available 24 hours/day ("most of the time") Type of Home: House Home Access: Stairs to enter Entrance Stairs-Rails: Doctor, general practice of Steps: 4   Home Layout: One level Home Equipment: Grab bars - tub/shower      Prior Function Prior Level of Function : Independent/Modified Independent             Mobility Comments: Reports could ambulate in community; likes to play golf; was a golf pro ADLs Comments: Independent with ADLs; has a friend that assist with iadls and driving  Pt has had 2 falls associated with this admission and only other fall was going up stairs carrying food and pt described as just an accident.      Hand Dominance   Dominant Hand: Right    Extremity/Trunk Assessment   Upper Extremity Assessment Upper Extremity Assessment: LUE deficits/detail;RUE deficits/detail RUE Deficits / Details: ROM WFL; MMT 4+/5 LUE Deficits / Details: ROM WFL; MMT 4+/5  Noted mild tremors bil hands  Lower Extremity Assessment Lower Extremity Assessment: LLE deficits/detail;RLE deficits/detail RLE Deficits / Details: ROM WFL; MMT 4+/5 LLE Deficits / Details: ROM WFL; MMT 4+/5    Cervical / Trunk Assessment Cervical / Trunk Assessment: Normal  Communication   Communication: No difficulties  Cognition Arousal/Alertness: Awake/alert Behavior During Therapy: Agitated, Impulsive Overall Cognitive Status: Impaired/Different from baseline Area of Impairment: Problem solving, Safety/judgement                        Following Commands: Follows one step commands consistently Safety/Judgement: Decreased awareness of safety Awareness: Emergent Problem Solving: Requires verbal cues, Requires tactile cues General Comments: Pt mildly impulsive (particularly with lines) but improves with cues.  Pt agitated and cursing at times in regards to lines and situation but agreeable to therapy.        General Comments General comments (skin integrity, edema, etc.): Pt initially reports weak and needs time, but agreeable to PT with education on importance of mobility to regain strength and decrease risk of further detoriation  Pt with some shortness of breath with activity.  HR was 80's and O2 was 96%   Exercises Other Exercises Other Exercises: Pt asking about doing heel slides, ankle pumps, and knee ext - demonstrated safely and encouraged to perform on his own as able.   Assessment/Plan    PT Assessment Patient needs continued PT services  PT Problem List Decreased strength;Decreased mobility;Decreased knowledge of precautions;Decreased safety awareness;Decreased balance;Decreased activity tolerance;Decreased knowledge of use of DME;Decreased range of motion;Decreased cognition;Decreased coordination       PT Treatment Interventions DME instruction;Therapeutic exercise;Gait training;Stair training;Functional mobility training;Therapeutic activities;Patient/family education;Modalities;Balance training    PT Goals (Current goals can be found in the Care Plan section)  Acute Rehab PT Goals Patient Stated  Goal: regain strength PT Goal Formulation: With patient Time For Goal Achievement: 10/05/22 Potential to Achieve Goals: Good    Frequency Min 1X/week     Co-evaluation               AM-PAC PT "6 Clicks" Mobility  Outcome Measure Help needed turning from your back to your side while in a flat bed without using bedrails?: A Little Help needed moving from lying on your back to sitting on  the side of a flat bed without using bedrails?: A Lot (min A but mod cues) Help needed moving to and from a bed to a chair (including a wheelchair)?: A Little Help needed standing up from a chair using your arms (e.g., wheelchair or bedside chair)?: A Little Help needed to walk in hospital room?: A Lot (min A but mod cues) Help needed climbing 3-5 steps with a railing? : A Lot 6 Click Score: 15    End of Session Equipment Utilized During Treatment: Gait belt Activity Tolerance: Patient tolerated treatment well Patient left: in bed;with call bell/phone within reach;with bed alarm set (declined chair) Nurse Communication: Mobility status (pt somewhat agitated and with mild tremors) PT Visit Diagnosis: Other abnormalities of gait and mobility (R26.89);Muscle weakness (generalized) (M62.81)    Time: 1610-9604 PT Time Calculation (min) (ACUTE ONLY): 20 min   Charges:   PT Evaluation $PT Eval Low Complexity: 1 Low          Arah Aro, PT Acute Rehab [facility] [phone]   Rayetta Humphrey 09/21/2022, 3:12 PM

## 2022-09-22 ENCOUNTER — Ambulatory Visit: Payer: 59

## 2022-09-22 ENCOUNTER — Inpatient Hospital Stay (HOSPITAL_COMMUNITY): Payer: 59

## 2022-09-22 DIAGNOSIS — A419 Sepsis, unspecified organism: Secondary | ICD-10-CM | POA: Diagnosis not present

## 2022-09-22 DIAGNOSIS — N390 Urinary tract infection, site not specified: Secondary | ICD-10-CM | POA: Diagnosis not present

## 2022-09-22 LAB — CBC
HCT: 31.5 % — ABNORMAL LOW (ref 39.0–52.0)
Hemoglobin: 11.2 g/dL — ABNORMAL LOW (ref 13.0–17.0)
MCH: 39.6 pg — ABNORMAL HIGH (ref 26.0–34.0)
MCHC: 35.6 g/dL (ref 30.0–36.0)
MCV: 111.3 fL — ABNORMAL HIGH (ref 80.0–100.0)
Platelets: 153 10*3/uL (ref 150–400)
RBC: 2.83 MIL/uL — ABNORMAL LOW (ref 4.22–5.81)
RDW: 11.9 % (ref 11.5–15.5)
WBC: 8.9 10*3/uL (ref 4.0–10.5)
nRBC: 0 % (ref 0.0–0.2)

## 2022-09-22 LAB — BASIC METABOLIC PANEL
Anion gap: 13 (ref 5–15)
BUN: 9 mg/dL (ref 8–23)
CO2: 18 mmol/L — ABNORMAL LOW (ref 22–32)
Calcium: 8.4 mg/dL — ABNORMAL LOW (ref 8.9–10.3)
Chloride: 93 mmol/L — ABNORMAL LOW (ref 98–111)
Creatinine, Ser: 0.72 mg/dL (ref 0.61–1.24)
GFR, Estimated: >60 mL/min (ref >60)
Glucose, Bld: 77 mg/dL (ref 70–99)
Potassium: 3 mmol/L — ABNORMAL LOW (ref 3.5–5.1)
Sodium: 124 mmol/L — ABNORMAL LOW (ref 135–145)

## 2022-09-22 LAB — BLOOD GAS, VENOUS
Acid-base deficit: 7.6 mmol/L — ABNORMAL HIGH (ref 0.0–2.0)
Bicarbonate: 14.3 mmol/L — ABNORMAL LOW (ref 20.0–28.0)
O2 Saturation: 91.2 %
Patient temperature: 37
pCO2, Ven: 21 mmHg — ABNORMAL LOW (ref 44–60)
pH, Ven: 7.44 — ABNORMAL HIGH (ref 7.25–7.43)
pO2, Ven: 59 mmHg — ABNORMAL HIGH (ref 32–45)

## 2022-09-22 LAB — URINE CULTURE: Culture: >=100000 — AB

## 2022-09-22 LAB — CULTURE, BLOOD (ROUTINE X 2): Culture: NO GROWTH

## 2022-09-22 LAB — MAGNESIUM: Magnesium: 1.5 mg/dL — ABNORMAL LOW (ref 1.7–2.4)

## 2022-09-22 LAB — SODIUM, URINE, RANDOM: Sodium, Ur: 123 mmol/L

## 2022-09-22 MED ORDER — IPRATROPIUM-ALBUTEROL 0.5-2.5 (3) MG/3ML IN SOLN: 3.0000 mL | Freq: Four times a day (QID) | RESPIRATORY_TRACT | Status: DC | PRN

## 2022-09-22 MED ORDER — SODIUM CHLORIDE 0.9 % IV SOLN
2.0000 g | Freq: Two times a day (BID) | INTRAVENOUS | Status: DC
  Administered 2022-09-22: 2 g via INTRAVENOUS
  Filled 2022-09-22: qty 12.5

## 2022-09-22 MED ORDER — ALUM & MAG HYDROXIDE-SIMETH 200-200-20 MG/5ML PO SUSP
30.0000 mL | ORAL | Status: DC | PRN
  Administered 2022-09-22 – 2022-10-03 (×6): 30 mL via ORAL
  Filled 2022-09-22 (×6): qty 30

## 2022-09-22 MED ORDER — VANCOMYCIN HCL 1250 MG/250ML IV SOLN
1250.0000 mg | Freq: Once | INTRAVENOUS | Status: AC
  Administered 2022-09-23: 1250 mg via INTRAVENOUS
  Filled 2022-09-22: qty 250

## 2022-09-22 MED ORDER — MAGNESIUM SULFATE 2 GM/50ML IV SOLN
2.0000 g | Freq: Once | INTRAVENOUS | Status: AC
  Administered 2022-09-22: 2 g via INTRAVENOUS
  Filled 2022-09-22: qty 50

## 2022-09-22 MED ORDER — VANCOMYCIN HCL IN DEXTROSE 1-5 GM/200ML-% IV SOLN: 1000.0000 mg | Freq: Two times a day (BID) | INTRAVENOUS | Status: DC

## 2022-09-22 MED ORDER — SODIUM CHLORIDE 0.9 % IV SOLN
2.0000 g | Freq: Three times a day (TID) | INTRAVENOUS | Status: DC
  Administered 2022-09-22 – 2022-09-27 (×14): 2 g via INTRAVENOUS
  Filled 2022-09-22 (×14): qty 12.5

## 2022-09-22 MED ORDER — ORAL CARE MOUTH RINSE: 15.0000 mL | OROMUCOSAL | Status: DC | PRN

## 2022-09-22 MED ORDER — MENTHOL 3 MG MT LOZG
1.0000 | LOZENGE | OROMUCOSAL | Status: DC | PRN
  Administered 2022-09-22 – 2022-09-25 (×2): 3 mg via ORAL
  Filled 2022-09-22 (×2): qty 9

## 2022-09-22 MED ORDER — POTASSIUM CHLORIDE CRYS ER 20 MEQ PO TBCR
40.0000 meq | EXTENDED_RELEASE_TABLET | ORAL | Status: AC
  Administered 2022-09-22 (×2): 40 meq via ORAL
  Filled 2022-09-22 (×2): qty 2

## 2022-09-22 MED ORDER — LIP MEDEX EX OINT
TOPICAL_OINTMENT | CUTANEOUS | Status: DC | PRN
  Filled 2022-09-22: qty 7

## 2022-09-22 NOTE — Progress Notes (Signed)
CSW spoke to the patient about SNF. At this time the patient does not want SNF he stated that he wanted to wait closer to DC. CSW explained that we will need to come up with a safe DC plan. This CSW will follow back up with this patient in the AM. TOC will continue to follow.

## 2022-09-22 NOTE — Progress Notes (Signed)
Pharmacy Antibiotic Note  David Townsend is a 62 y.o. 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 .  Pharmacy has been consulted for vanc and cefepime dosing for pna  Plan: Vancomycin  IV x 1 then 1gm q12h (AUC 518.4, Used Scr 0.8, TBW) Pt already on cefepime 2gm IV q12h will adjust to q8h Follow renal function ,cultures and clinical course  Height:  (175.3 cm) Weight: 66.7 kg (147 lb 0.8 oz) IBW/kg (Calculated) : 70.7  Temp (24hrs), Avg:100.2 F (37.9 C), Min:98.3 F (36.8 C), Max:102.5 F (39.2 C)  Recent Labs  Lab 09/19/22 1150 09/19/22 1423 09/20/22 0631 09/21/22 0543 09/22/22 0907  WBC 16.6*  --  13.9* 9.3 8.9  CREATININE 0.99  --  0.85 0.74 0.72  LATICACIDVEN  --  0.8  --   --   --     Estimated Creatinine Clearance: 91.5 mL/min (by C-G formula based on SCr of 0.72 mg/dL).    Allergies  Allergen Reactions   Penicillins Other (See Comments)    Whelps     Antimicrobials this admission: 4/17 cipro >> 4/18 4/18 CTX >> 4/19 4/20 cefepime >> 4/21 vanc >>   Dose adjustments this admission:   Microbiology results: 4/18 BCx:  4/17 UCx: David Townsend  4/20 MRSA PCR:   Thank you for allowing pharmacy to be a part of this patient's care.  David Townsend RPh 09/22/2022, 11:35 PM

## 2022-09-22 NOTE — Progress Notes (Signed)
Virgel Manifold, NP and RRT Jule Ser, RN on floor, RT in room, new orders rec'd and followed as ordered, patient transferred to ICU 1229  09/22/22 2132  Assess: MEWS Score  Temp (!) 102.5 F (39.2 C)  BP (!) 167/80  Pulse Rate (!) 106  Resp (!) 36  Level of Consciousness Alert  O2 Device Nasal Cannula  O2 Flow Rate (L/min) 5 L/min  Assess: MEWS Score  MEWS Temp 2  MEWS Systolic 0  MEWS Pulse 1  MEWS RR 3  MEWS LOC 0  MEWS Score 6  MEWS Score Color Red  Assess: if the MEWS score is Yellow or Red  Were vital signs taken at a resting state? Yes  Focused Assessment No change from prior assessment  Does the patient meet 2 or more of the SIRS criteria? Yes  Does the patient have a confirmed or suspected source of infection? Yes  Provider and Rapid Response Notified? Yes  MEWS guidelines implemented  Yes, red  Treat  MEWS Interventions Considered administering scheduled or prn medications/treatments as ordered  Take Vital Signs  Increase Vital Sign Frequency  Red: Q1hr x2, continue Q4hrs until patient remains green for 12hrs  Escalate  MEWS: Escalate Red: Discuss with charge nurse and notify provider. Consider notifying RRT. If remains red for 2 hours consider need for higher level of care  Notify: Charge Nurse/RN  Name of Charge Nurse/RN Notified [place], RN  Provider Notification  Provider Name/Title Virgel Manifold, NP  Date Provider Notified 09/22/22  Time Provider Notified 2147  Method of Notification Page (securechat)  Notification Reason Other (Comment) (red MEWS)  Provider response En route  Date of Provider Response 09/20/22  Time of Provider Response 2149  Notify: Rapid Response  Name of Rapid Response RN Notified [place], RN  Date Rapid Response Notified 09/22/22  Time Rapid Response Notified 2148  Assess: SIRS CRITERIA  SIRS Temperature  1  SIRS Pulse 1  SIRS Respirations  1  SIRS WBC 0  SIRS Score Sum  3

## 2022-09-22 NOTE — Progress Notes (Signed)
PROGRESS NOTE    David Townsend  ZOX:[id number] DOB: 05/24/1961 DOA: 09/19/2022 PCP: Rema Fendt, NP     Brief Narrative:  David Townsend is a 62 y.o. 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    Reports a few days ago he felt generalized weakness and fell in the bathroom. Had to crawl to get back into bed. Mostly has been in bed since. Today slipped out of bed and laid on floor for 3 hours unable to get up. States he has abdominal pain but thinks it is due to straining to try and get up off the floor.   In the emergency department, patient was found to have UTI and was started on empiric antibiotic.  His Foley catheter was not exchanged, patient declined.  New events last 24 hours / Subjective: Fever 101.1 last night.  Feeling about the same.  States that he was able to work with physical therapy, walked around the room using a walker.  Continues to be very weak.  Drinks a lot of fluids.  Assessment & Plan:   Principal Problem:   Sepsis secondary to UTI Active Problems:   ETOH abuse   Hyperlipidemia   Prostate cancer metastatic to bone   Tobacco abuse   Essential hypertension   Rhabdomyolysis   Hyponatremia   Sepsis secondary to UTI -UTI related with chronic indwelling Foley catheter -Sepsis present on admission with fever, tachycardia -Rocephin --> cefepime due to continued fever -Urine culture pending, showing E. coli so far.  Susceptibility is pending -Blood culture negative to date -Leukocytosis resolved  Fall with generalized weakness -PT OT, suspect patient may need placement in SNF for discharge  Hypotonic hyponatremia -Serum osmole 273 -Urine sodium 123 -Urine osmole pending -Question SIADH, Potomania.  Placed on fluid restriction diet.  May need salt tabs. -Hold  Avapro  Hypokalemia -Replace  Hypomagnesemia -Replace  Rhabdomyolysis -Improved with IV fluid  Metastatic prostate cancer -Followed by Dr. Berneice Heinrich, [facility] -Lavone Neri   Hypertension -Amlodipine, Avapro - on hold due to hyponatremia   Hyperlipidemia -Lipitor  Alcohol abuse -CIWA protocol, B1, folic acid  Tobacco abuse -Nicotine patch    DVT prophylaxis:  enoxaparin (LOVENOX) injection 40 mg Start: 09/20/22 1000  Code Status: Full code Family Communication: None at bedside Disposition Plan: Home versus SNF pending PT evaluation Status is: Inpatient Remains inpatient appropriate because: IV antibiotics, sodium workup.  Needs PT and SNF      Antimicrobials:  Anti-infectives (From admission, onward)    Start     Dose/Rate Route Frequency Ordered Stop   09/20/22 2000  cefTRIAXone (ROCEPHIN) 1 g in sodium chloride 0.9 % 100 mL IVPB        1 g 200 mL/hr over 30 Minutes Intravenous Every 24 hours 09/20/22 1038     09/19/22 2200  ciprofloxacin (CIPRO) IVPB 200 mg  Status:  Discontinued        200 mg 100 mL/hr over 60 Minutes Intravenous Every 12 hours 09/19/22 2133 09/20/22 1038        Objective: Vitals:   09/21/22 1423 09/21/22 2034 09/21/22 2352 09/22/22 0547  BP: (!) 150/69 (!) 152/78  135/70  Pulse: 95 98  85  Resp: Temp: 97.6 F (36.4 C) (!) 101.1 F (38.4 C) 98.4 F (36.9 C) 98.3 F (36.8 C)  TempSrc: Oral Oral Oral Oral  SpO2: 94% 94%  92%  Weight:      Height:        Intake/Output Summary (Last 24 hours) at 09/22/2022 1116 Last data filed at 09/22/2022 0700 Gross per 24 hour  Intake 1570 ml  Output 3050 ml  Net -1480 ml    Filed Weights   09/19/22 1128  Weight: 70.3 kg    Examination:  General exam: Appears calm and comfortable  Respiratory system: Clear to auscultation. Respiratory effort normal. No respiratory distress. No conversational dyspnea.  Cardiovascular system: S1 & S2 heard, RRR. No murmurs. No pedal  edema. Gastrointestinal system: Abdomen is nondistended, soft and nontender. Normal bowel sounds heard. Central nervous system: Alert and oriented.  Extremities: Symmetric in appearance  Skin: No rashes, lesions or ulcers on exposed skin  Psychiatry: Judgement and insight appear normal. Mood & affect appropriate.   Data Reviewed: I have personally reviewed following labs and imaging studies  CBC: Recent Labs  Lab 09/19/22 1150 09/20/22 0631 09/21/22 0543 09/22/22 0907  WBC 16.6* 13.9* 9.3 8.9  NEUTROABS 15.1*  --   --   --   HGB 12.8* 11.1* 11.3* 11.2*  HCT 35.7* 32.4* 32.5* 31.5*  MCV 112.3* 114.1* 114.8* 111.3*  PLT 193 158 153 153    Basic Metabolic Panel: Recent Labs  Lab 09/19/22 1150 09/20/22 0631 09/21/22 0543  NA 127* 125* 123*  K 3.6 3.4* 3.3*  CL 91* 93* 94*  CO2 23 20* 15*  GLUCOSE 110* 79 74  BUN 16 14 13   CREATININE 0.99 0.85 0.74  CALCIUM 9.7 8.7* 8.3*  MG 1.9  --  1.7    GFR: Estimated Creatinine Clearance: 96.4 mL/min (by C-G formula based on SCr of 0.74 mg/dL). Liver Function Tests: Recent Labs  Lab 09/19/22 1244  AST 148*  ALT 36  ALKPHOS 54  BILITOT 0.7  PROT 8.3*  ALBUMIN 4.5    No results for input(s): "LIPASE", "AMYLASE" in the last 168 hours. No results for input(s): "AMMONIA" in the last 168 hours. Coagulation Profile: No results for input(s): "INR", "PROTIME" in the last 168 hours. Cardiac Enzymes: Recent Labs  Lab 09/19/22 1424 09/20/22 0631  CKTOTAL 6,277* 3,688*    BNP (last 3 results) No results for input(s): "PROBNP" in the last 8760 hours. HbA1C: No results for input(s): "HGBA1C" in the last 72 hours. CBG: No results for input(s): "GLUCAP" in the last 168 hours. Lipid Profile: No results for input(s): "CHOL", "HDL", "LDLCALC", "TRIG", "CHOLHDL", "LDLDIRECT" in the last 72 hours. Thyroid Function Tests: No results for input(s): "TSH", "T4TOTAL", "FREET4", "T3FREE", "THYROIDAB" in the last 72 hours. Anemia  Panel: No results for input(s): "VITAMINB12", "FOLATE", "FERRITIN", "TIBC", "IRON", "RETICCTPCT" in the last 72 hours. Sepsis Labs: Recent Labs  Lab 09/19/22 1423  LATICACIDVEN 0.8     Recent Results (from the past 240 hour(s))  Culture, Urine (Do not remove urinary catheter, catheter placed by urology or difficult to place)     Status: Abnormal (Preliminary result)   Collection Time: 09/19/22 11:50 AM   Specimen: Urine, Catheterized  Result Value Ref Range Status   Specimen Description   Final    URINE, CATHETERIZED Performed at [facility], [address], [place], [place] [address]    Special Requests   Final    NONE Performed at [facility], [address] [place], [place] [address]    Culture (A)  Final    >=100,000 COLONIES/mL ESCHERICHIA COLI CULTURE REINCUBATED FOR BETTER GROWTH SUSCEPTIBILITIES  TO FOLLOW Performed at [facility], [address], [place], [place] [address]    Report Status PENDING  Incomplete  Culture, blood (Routine X 2) w Reflex to ID Panel     Status: None (Preliminary result)   Collection Time: 09/20/22 11:48 AM   Specimen: BLOOD  Result Value Ref Range Status   Specimen Description   Final    BLOOD BLOOD RIGHT ARM AEROBIC BOTTLE ONLY Performed at [facility], [address], [place], [place] [address]    Special Requests   Final    BOTTLES DRAWN AEROBIC ONLY Blood Culture adequate volume Performed at [facility], [address] [place], [place] [address]    Culture   Final    NO GROWTH 2 DAYS Performed at [facility], [address], [place], [place] [address]    Report Status PENDING  Incomplete  Culture, blood (Routine X 2) w Reflex to ID Panel     Status: None (Preliminary result)   Collection Time: 09/20/22 11:48 AM   Specimen: BLOOD  Result Value Ref Range Status   Specimen Description   Final    BLOOD BLOOD RIGHT HAND Performed at  [facility], [address], [place], [place] [address]    Special Requests   Final    BOTTLES DRAWN AEROBIC ONLY Blood Culture adequate volume Performed at [facility], [address], [place], [place] [address]    Culture   Final    NO GROWTH 2 DAYS Performed at [facility], [address] [place], [place] [address]    Report Status PENDING  Incomplete      Radiology Studies: No results found.    Scheduled Meds:  amLODipine  10 mg Oral Daily   atorvastatin  40 mg Oral Daily   Chlorhexidine Gluconate Cloth  6 each Topical Daily   enoxaparin (LOVENOX) injection  40 mg Subcutaneous Q24H   folic acid  1 mg Oral Daily   irbesartan  300 mg Oral Daily   multivitamin with minerals  1 tablet Oral Daily   nicotine  21 mg Transdermal Daily   thiamine  100 mg Oral Daily   Or   thiamine  100 mg Intravenous Daily   Continuous Infusions:  cefTRIAXone (ROCEPHIN)  IV Stopped (09/21/22 2046)     LOS: 2 days   Time spent: 25 minutes   Noralee Stain, DO [facility] 09/22/2022, 11:16 AM   Available via Epic secure chat 7am-7pm After these hours, please refer to coverage provider listed on [email or web address]

## 2022-09-22 NOTE — Progress Notes (Signed)
PT Cancellation Note  Patient Details Name: Raji Glinski MRN: [id number] DOB: July 07, 1960   Cancelled Treatment:    Reason Eval/Treat Not Completed: Fatigue/lethargy limiting ability to participate (pt refused PT, he reports he had a rough night last night and needs to sleep right now.)  Tamala Ser PT 09/22/2022  Acute Rehabilitation Services  Office [phone]

## 2022-09-22 NOTE — Progress Notes (Signed)
RN called due to oxygen sats being in the 80's. RN placed patient on a 6 L [place]. This RT placed patient on 8L [place] to bring sats above 92%. Patient is sating 92% at this time.

## 2022-09-23 ENCOUNTER — Inpatient Hospital Stay (HOSPITAL_COMMUNITY): Payer: 59

## 2022-09-23 DIAGNOSIS — A419 Sepsis, unspecified organism: Secondary | ICD-10-CM | POA: Diagnosis not present

## 2022-09-23 DIAGNOSIS — N390 Urinary tract infection, site not specified: Secondary | ICD-10-CM | POA: Diagnosis not present

## 2022-09-23 LAB — CBC WITH DIFFERENTIAL/PLATELET
Abs Immature Granulocytes: 0.1 10*3/uL — ABNORMAL HIGH (ref 0.00–0.07)
Basophils Absolute: 0 10*3/uL (ref 0.0–0.1)
Basophils Relative: 0 %
Eosinophils Absolute: 0 10*3/uL (ref 0.0–0.5)
Eosinophils Relative: 0 %
HCT: 30.1 % — ABNORMAL LOW (ref 39.0–52.0)
Hemoglobin: 11.1 g/dL — ABNORMAL LOW (ref 13.0–17.0)
Immature Granulocytes: 1 %
Lymphocytes Relative: 3 %
Lymphs Abs: 0.3 10*3/uL — ABNORMAL LOW (ref 0.7–4.0)
MCH: 39.4 pg — ABNORMAL HIGH (ref 26.0–34.0)
MCHC: 36.9 g/dL — ABNORMAL HIGH (ref 30.0–36.0)
MCV: 106.7 fL — ABNORMAL HIGH (ref 80.0–100.0)
Monocytes Absolute: 0.3 10*3/uL (ref 0.1–1.0)
Monocytes Relative: 3 %
Neutro Abs: 7.5 10*3/uL (ref 1.7–7.7)
Neutrophils Relative %: 93 %
Platelets: 134 10*3/uL — ABNORMAL LOW (ref 150–400)
RBC: 2.82 MIL/uL — ABNORMAL LOW (ref 4.22–5.81)
RDW: 11.8 % (ref 11.5–15.5)
WBC: 8.1 10*3/uL (ref 4.0–10.5)
nRBC: 0 % (ref 0.0–0.2)

## 2022-09-23 LAB — COMPREHENSIVE METABOLIC PANEL
ALT: 86 U/L — ABNORMAL HIGH (ref 0–44)
AST: 392 U/L — ABNORMAL HIGH (ref 15–41)
Albumin: 2.6 g/dL — ABNORMAL LOW (ref 3.5–5.0)
Alkaline Phosphatase: 58 U/L (ref 38–126)
Anion gap: 16 — ABNORMAL HIGH (ref 5–15)
BUN: 11 mg/dL (ref 8–23)
CO2: 15 mmol/L — ABNORMAL LOW (ref 22–32)
Calcium: 7.8 mg/dL — ABNORMAL LOW (ref 8.9–10.3)
Chloride: 89 mmol/L — ABNORMAL LOW (ref 98–111)
Creatinine, Ser: 0.87 mg/dL (ref 0.61–1.24)
GFR, Estimated: >60 mL/min (ref >60)
Glucose, Bld: 82 mg/dL (ref 70–99)
Potassium: 2.7 mmol/L — CL (ref 3.5–5.1)
Sodium: 120 mmol/L — ABNORMAL LOW (ref 135–145)
Total Bilirubin: 1.4 mg/dL — ABNORMAL HIGH (ref 0.3–1.2)
Total Protein: 5.6 g/dL — ABNORMAL LOW (ref 6.5–8.1)

## 2022-09-23 LAB — MAGNESIUM
Magnesium: 1.9 mg/dL (ref 1.7–2.4)
Magnesium: 2 mg/dL (ref 1.7–2.4)

## 2022-09-23 LAB — SARS CORONAVIRUS 2 BY RT PCR: SARS Coronavirus 2 by RT PCR: NEGATIVE

## 2022-09-23 LAB — PROCALCITONIN: Procalcitonin: 6.43 ng/mL

## 2022-09-23 LAB — BLOOD GAS, ARTERIAL
Acid-base deficit: 4.7 mmol/L — ABNORMAL HIGH (ref 0.0–2.0)
Bicarbonate: 16 mmol/L — ABNORMAL LOW (ref 20.0–28.0)
Drawn by: 270211
O2 Saturation: 97.7 %
Patient temperature: 37.1
pCO2 arterial: 20 mmHg — ABNORMAL LOW (ref 32–48)
pH, Arterial: 7.51 — ABNORMAL HIGH (ref 7.35–7.45)
pO2, Arterial: 65 mmHg — ABNORMAL LOW (ref 83–108)

## 2022-09-23 LAB — BLOOD GAS, VENOUS
Acid-base deficit: 6.3 mmol/L — ABNORMAL HIGH (ref 0.0–2.0)
Bicarbonate: 18.1 mmol/L — ABNORMAL LOW (ref 20.0–28.0)
O2 Saturation: 25.2 %
Patient temperature: 36.7
pCO2, Ven: 32 mmHg — ABNORMAL LOW (ref 44–60)
pH, Ven: 7.36 (ref 7.25–7.43)
pO2, Ven: <31 mmHg — CL (ref 32–45)

## 2022-09-23 LAB — BASIC METABOLIC PANEL
Anion gap: 9 (ref 5–15)
BUN: 13 mg/dL (ref 8–23)
CO2: 18 mmol/L — ABNORMAL LOW (ref 22–32)
Calcium: 7.8 mg/dL — ABNORMAL LOW (ref 8.9–10.3)
Chloride: 94 mmol/L — ABNORMAL LOW (ref 98–111)
Creatinine, Ser: 0.89 mg/dL (ref 0.61–1.24)
GFR, Estimated: >60 mL/min (ref >60)
Glucose, Bld: 91 mg/dL (ref 70–99)
Potassium: 2.9 mmol/L — ABNORMAL LOW (ref 3.5–5.1)
Sodium: 121 mmol/L — ABNORMAL LOW (ref 135–145)

## 2022-09-23 LAB — CULTURE, BLOOD (ROUTINE X 2)
Culture: NO GROWTH
Special Requests: ADEQUATE

## 2022-09-23 LAB — LACTIC ACID, PLASMA
Lactic Acid, Venous: 0.9 mmol/L (ref 0.5–1.9)
Lactic Acid, Venous: 0.9 mmol/L (ref 0.5–1.9)
Lactic Acid, Venous: 1.1 mmol/L (ref 0.5–1.9)

## 2022-09-23 LAB — URINE CULTURE

## 2022-09-23 LAB — BRAIN NATRIURETIC PEPTIDE: B Natriuretic Peptide: 275.5 pg/mL — ABNORMAL HIGH (ref 0.0–100.0)

## 2022-09-23 LAB — D-DIMER, QUANTITATIVE: D-Dimer, Quant: 3.65 ug/mL-FEU — ABNORMAL HIGH (ref 0.00–0.50)

## 2022-09-23 LAB — OSMOLALITY, URINE: Osmolality, Ur: 493 mOsm/kg (ref 300–900)

## 2022-09-23 LAB — MRSA NEXT GEN BY PCR, NASAL: MRSA by PCR Next Gen: NOT DETECTED

## 2022-09-23 MED ORDER — NITROFURANTOIN MONOHYD MACRO 100 MG PO CAPS
100.0000 mg | ORAL_CAPSULE | Freq: Two times a day (BID) | ORAL | Status: DC
  Administered 2022-09-23 – 2022-09-27 (×9): 100 mg via ORAL
  Filled 2022-09-23 (×10): qty 1

## 2022-09-23 MED ORDER — LACTATED RINGERS IV BOLUS: 250.0000 mL | Freq: Once | INTRAVENOUS | Status: DC

## 2022-09-23 MED ORDER — HYDROCODONE BIT-HOMATROP MBR 5-1.5 MG/5ML PO SOLN
5.0000 mL | Freq: Four times a day (QID) | ORAL | Status: DC | PRN
  Administered 2022-09-23 – 2022-09-26 (×7): 5 mL via ORAL
  Filled 2022-09-23 (×8): qty 5

## 2022-09-23 MED ORDER — SODIUM CHLORIDE (PF) 0.9 % IJ SOLN
INTRAMUSCULAR | Status: AC
  Filled 2022-09-23: qty 50

## 2022-09-23 MED ORDER — MOMETASONE FURO-FORMOTEROL FUM 200-5 MCG/ACT IN AERO
2.0000 | INHALATION_SPRAY | Freq: Two times a day (BID) | RESPIRATORY_TRACT | Status: DC
  Administered 2022-09-23 – 2022-10-04 (×22): 2 via RESPIRATORY_TRACT
  Filled 2022-09-23: qty 8.8

## 2022-09-23 MED ORDER — METHYLPREDNISOLONE SODIUM SUCC 125 MG IJ SOLR
80.0000 mg | Freq: Every day | INTRAMUSCULAR | Status: DC
  Administered 2022-09-23 – 2022-09-28 (×6): 80 mg via INTRAVENOUS
  Filled 2022-09-23 (×6): qty 2

## 2022-09-23 MED ORDER — POTASSIUM CHLORIDE 10 MEQ/100ML IV SOLN
10.0000 meq | INTRAVENOUS | Status: AC
  Administered 2022-09-23 (×6): 10 meq via INTRAVENOUS
  Filled 2022-09-23 (×6): qty 100

## 2022-09-23 MED ORDER — LIDOCAINE HCL URETHRAL/MUCOSAL 2 % EX GEL
1.0000 | Freq: Once | CUTANEOUS | Status: AC
  Administered 2022-09-23: 1 via URETHRAL
  Filled 2022-09-23: qty 5

## 2022-09-23 MED ORDER — SODIUM CHLORIDE 0.9 % IV SOLN: INTRAVENOUS | Status: DC

## 2022-09-23 MED ORDER — LIDOCAINE 5 % EX PTCH
1.0000 | MEDICATED_PATCH | CUTANEOUS | Status: DC
  Administered 2022-09-23 – 2022-10-04 (×12): 1 via TRANSDERMAL
  Filled 2022-09-23 (×12): qty 1

## 2022-09-23 MED ORDER — SODIUM CHLORIDE 1 G PO TABS
1.0000 g | ORAL_TABLET | Freq: Two times a day (BID) | ORAL | Status: DC
  Administered 2022-09-23 – 2022-10-04 (×23): 1 g via ORAL
  Filled 2022-09-23 (×23): qty 1

## 2022-09-23 MED ORDER — TRAMADOL HCL 50 MG PO TABS
50.0000 mg | ORAL_TABLET | Freq: Two times a day (BID) | ORAL | Status: DC | PRN
  Administered 2022-09-23 – 2022-09-30 (×8): 50 mg via ORAL
  Filled 2022-09-23 (×8): qty 1

## 2022-09-23 MED ORDER — LACTATED RINGERS IV BOLUS
500.0000 mL | Freq: Once | INTRAVENOUS | Status: AC
  Administered 2022-09-23: 500 mL via INTRAVENOUS

## 2022-09-23 MED ORDER — IOHEXOL 350 MG/ML SOLN
75.0000 mL | Freq: Once | INTRAVENOUS | Status: AC | PRN
  Administered 2022-09-23: 75 mL via INTRAVENOUS

## 2022-09-23 NOTE — Evaluation (Signed)
Clinical/Bedside Swallow Evaluation Patient Details  Name: David Townsend MRN: [id number] Date of Birth: 07-31-1960  Today's Date: 09/23/2022 Time: SLP Start Time (ACUTE ONLY): 0830 SLP Stop Time (ACUTE ONLY): 0840 SLP Time Calculation (min) (ACUTE ONLY): 10 min  Past Medical History:  Past Medical History:  Diagnosis Date   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   Essential hypertension 04/23/2022   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   Hyperlipidemia 03/27/2022   Penile lesion    Prostate cancer metastatic to bone 04/23/2022   Urinary retention 04/23/2022   Past Surgical History:  Past Surgical History:  Procedure Laterality Date   NO PAST SURGERIES     PENILE BIOPSY N/A 01/12/2020   Procedure: EXCISION OF PENILE LESION;  Surgeon: Noel Christmas, MD;  Location: [facility] [place];  Service: Urology;  Laterality: N/A;   HPI:  Patient is a 62 y.o. male with PMH: HTN, B12 deficiency, prostate cancer with metastasis to bone on androgen deprivation therapy, chronic indwelling foley catheter, ETOH abuse, heavy tobacco use (1ppd). He presented to the hospital on 09/19/22 with weakness after fall in bathroom and inability to get up for three hours. ETOH and UDS both negative,  CT head negative for acute intracranial abnormality, MRI thoracic and lumbar spine with diffuse osseous metastatic disease through spine and sacrum. UA with small leukocytes and many bacteria. He was admitted with sepsis secondary to UTI. CXR chest results were consistent with developing infiltrate throughout left lung and CT angio/chest/PE showed severe, bilateral multilobar PNA, worse on left.  SLP swallow evaluation ordered 09/22/22 secondary to new SOB with changes to chest x-ray as well as muscle weakness due to rhabo and cancer causing concern for potential aspiration.    Assessment / Plan / Recommendation  Clinical Impression  Patient is not currently presenting with clinical s/s of dysphagia as per this  bedside swallow evaluation. He has no h/o oropharyngeal dysphagia or GERD as per chart review and patient interview. When SLP arrived into room, RN in room about to give him a medication, patient awake and alert and nasal cannula in place. SpO2 in mid-90% and RR in low to mid 20's. Patient's voice was clear and strong and he did not appear to be in any distress. He was agreeable to taking his medication and drinking some water but declined any other PO's, telling SLP and RN he was not hungry. Swallow initiation appeared timely and no overt s/s aspiration or penetration observed. No change in voice or vitals. SLP not recommending any further skilled intervention at this time and risk of aspiration from PO's is judged to be very low. SLP Visit Diagnosis: Dysphagia, unspecified (R13.10)    Aspiration Risk  No limitations;Mild aspiration risk    Diet Recommendation Regular;Thin liquid   Liquid Administration via: Straw;Cup Medication Administration: Whole meds with liquid Supervision: Patient able to self feed Postural Changes: Seated upright at 90 degrees    Other  Recommendations Oral Care Recommendations: Oral care BID    Recommendations for follow up therapy are one component of a multi-disciplinary discharge planning process, led by the attending physician.  Recommendations may be updated based on patient status, additional functional criteria and insurance authorization.  Follow up Recommendations No SLP follow up      Assistance Recommended at Discharge  N/A  Functional Status Assessment Patient has not had a recent decline in their functional status  Frequency and Duration   N/A         Prognosis  Swallow Study   General Date of Onset: 09/22/22 HPI: Patient is a 62 y.o. male with PMH: HTN, B12 deficiency, prostate cancer with metastasis to bone on androgen deprivation therapy, chronic indwelling foley catheter, ETOH abuse, heavy tobacco use (1ppd). He presented to the  hospital on 09/19/22 with weakness after fall in bathroom and inability to get up for three hours. ETOH and UDS both negative,  CT head negative for acute intracranial abnormality, MRI thoracic and lumbar spine with diffuse osseous metastatic disease through spine and sacrum. UA with small leukocytes and many bacteria. He was admitted with sepsis secondary to UTI. CXR chest results were consistent with developing infiltrate throughout left lung and CT angio/chest/PE showed severe, bilateral multilobar PNA, worse on left.  SLP swallow evaluation ordered 09/22/22 secondary to new SOB with changes to chest x-ray as well as muscle weakness due to rhabo and cancer causing concern for potential aspiration. Type of Study: Bedside Swallow Evaluation Previous Swallow Assessment: none found Diet Prior to this Study: Regular;Thin liquids (Level 0) Temperature Spikes Noted: No Respiratory Status: Nasal cannula History of Recent Intubation: No Behavior/Cognition: Alert;Cooperative;Pleasant mood Oral Cavity Assessment: Within Functional Limits Oral Care Completed by SLP: No Oral Cavity - Dentition: Adequate natural dentition Vision: Functional for self-feeding Self-Feeding Abilities: Able to feed self Patient Positioning: Upright in bed Baseline Vocal Quality: Normal Volitional Cough: Strong Volitional Swallow: Able to elicit    Oral/Motor/Sensory Function Overall Oral Motor/Sensory Function: Within functional limits   Ice Chips     Thin Liquid Thin Liquid: Within functional limits Presentation: Straw;Self Fed    Nectar Thick     Honey Thick     Puree Puree: Not tested   Solid     Solid: Not tested     Angela Nevin, MA, CCC-SLP Speech Therapy

## 2022-09-23 NOTE — Progress Notes (Signed)
Rapid Response Event Note   Reason for Call : sats decreased to the 80%   Initial Focused Assessment: arrived to find pt A/O.  Able to F/C.  Lung sounds are decreased.  See flowsheet for VS.  Pt is weak and has an elevated temperature and increased R/R.  Denies pain.   Interventions: Chest x-ray, VBG, Nebulizing treatment, Labs- CBC, CMP, lactic acid, procalcitonin, D-dimer, BNP, and a Fluid bolus per [facility], NP orders.  Plan of Care: Pt will transfer to ICU/SD to room 1229, pt states he will call his family in the morning to let them know that he has moved to a new location.   Event Summary:   MD Notified: yes Call Time: 2150 Arrival Time: 2200 End Time: 2235  Conley Rolls, RN

## 2022-09-23 NOTE — Progress Notes (Signed)
PROGRESS NOTE    David Townsend  NGE:[id number] DOB: Oct 22, 1960 DOA: 09/19/2022 PCP: Rema Fendt, NP     Brief Narrative:  David Townsend is a 62 y.o. 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    Reports a few days ago he felt generalized weakness and fell in the bathroom. Had to crawl to get back into bed. Mostly has been in bed since. Today slipped out of bed and laid on floor for 3 hours unable to get up. States he has abdominal pain but thinks it is due to straining to try and get up off the floor.   In the emergency department, patient was found to have UTI and was started on empiric antibiotic.  His Foley catheter was not exchanged, patient declined.  New events last 24 hours / Subjective: Overnight, patient became hypoxic.  He required up to 10 L high flow oxygen.  CTA chest was completed which revealed multilobar pneumonia bilaterally.  He was transferred to stepdown unit.  This morning, patient denies any worsening shortness of breath.  Continues to have cough.  Assessment & Plan:   Principal Problem:   Sepsis secondary to UTI Active Problems:   ETOH abuse   Hyperlipidemia   Prostate cancer metastatic to bone   Tobacco abuse   Essential hypertension   Rhabdomyolysis   Hyponatremia   Sepsis secondary to UTI, HCAP -UTI related with chronic indwelling Foley catheter -Sepsis present on admission with fever, tachycardia -COVID negative, MRSA PCR negative -Blood culture negative to date -Leukocytosis resolved -Urine culture showing E. Coli, enterococcus --> Macrobid -Rocephin --> cefepime to cover HCAP   Acute hypoxemic respiratory failure -Overnight 4/20-21, he became hypoxic, saturating 80% on room air.  Required up to 10 L high flow oxygen.  Transfer to stepdown unit. -Secondary to HCAP -Remains on  9 L nasal cannula O2 today -Added Solu-Medrol, Dulera. He has significant smoking history.  Fall with generalized weakness -PT OT, suspect patient may need placement in SNF for discharge  Hypotonic hyponatremia -Serum osmole 273 -Urine sodium 123 -Urine osmole 493  -Consistent with SIADH, consider potomania.  Placed on fluid restriction diet. -Start salt tabs  -Monitor BMP   Hypokalemia -Replace  Rhabdomyolysis -Improved with IV fluid  Metastatic prostate cancer -Followed by Dr. Berneice Heinrich, [facility] -Lavone Neri   Hypertension -Amlodipine, Avapro - on hold due to hyponatremia   Hyperlipidemia -Lipitor  Alcohol abuse -CIWA protocol, B1, folic acid  Tobacco abuse -Nicotine patch    DVT prophylaxis:  enoxaparin (LOVENOX) injection 40 mg Start: 09/20/22 1000  Code Status: Full code Family Communication: None at bedside Disposition Plan: Home versus SNF Status is: Inpatient Remains inpatient appropriate because: IV antibiotics, O2    Antimicrobials:  Anti-infectives (From admission, onward)    Start     Dose/Rate Route Frequency Ordered Stop   09/23/22 1200  vancomycin (VANCOCIN) IVPB 1000 mg/200 mL premix  Status:  Discontinued        1,000 mg 200 mL/hr over 60 Minutes Intravenous Every 12 hours 09/22/22 2336 09/23/22 0743   09/23/22 1000  nitrofurantoin (macrocrystal-monohydrate) (MACROBID) capsule 100 mg        100 mg Oral Every 12 hours 09/23/22 0743     09/23/22 0015  vancomycin (VANCOREADY) IVPB 1250 mg/250 mL        1,250 mg 166.7 mL/hr over 90 Minutes Intravenous  Once  09/22/22 2325 09/23/22 0326   09/22/22 2345  ceFEPIme (MAXIPIME) 2 g in sodium chloride 0.9 % 100 mL IVPB        2 g 200 mL/hr over 30 Minutes Intravenous Every 8 hours 09/22/22 2336     09/22/22 1200  ceFEPIme (MAXIPIME) 2 g in sodium chloride 0.9 % 100 mL IVPB  Status:  Discontinued        2 g 200 mL/hr over 30 Minutes Intravenous Every 12 hours 09/22/22 1121 09/22/22 2336    09/20/22 2000  cefTRIAXone (ROCEPHIN) 1 g in sodium chloride 0.9 % 100 mL IVPB  Status:  Discontinued        1 g 200 mL/hr over 30 Minutes Intravenous Every 24 hours 09/20/22 1038 09/22/22 1121   09/19/22 2200  ciprofloxacin (CIPRO) IVPB 200 mg  Status:  Discontinued        200 mg 100 mL/hr over 60 Minutes Intravenous Every 12 hours 09/19/22 2133 09/20/22 1038        Objective: Vitals:   09/23/22 1100 09/23/22 1200 09/23/22 1300 09/23/22 1313  BP: (!) 135/121 (!) 146/56  (!) 124/57  Pulse: 88 93 94 (!) 101  Resp: (!) 31 (!) 34 (!) 31 (!) 30  Temp:  98.3 F (36.8 C)    TempSrc:  Oral    SpO2: 95% 96% 93% 92%  Weight:      Height:        Intake/Output Summary (Last 24 hours) at 09/23/2022 1329 Last data filed at 09/23/2022 1122 Gross per 24 hour  Intake 2042.72 ml  Output 1200 ml  Net 842.72 ml    Filed Weights   09/19/22 1128 09/22/22 2245  Weight: 70.3 kg 66.7 kg    Examination:  General exam: Appears calm and comfortable  Respiratory system: Without distress  Cardiovascular system: NSR. No pedal edema. Gastrointestinal system: Abdomen is nondistended, soft and nontender Central nervous system: Alert and oriented.  Extremities: Symmetric in appearance  Skin: No rashes, lesions or ulcers on exposed skin  Psychiatry: Judgement and insight appear normal. Mood & affect appropriate.   Data Reviewed: I have personally reviewed following labs and imaging studies  CBC: Recent Labs  Lab 09/19/22 1150 09/20/22 0631 09/21/22 0543 09/22/22 0907 09/22/22 2333  WBC 16.6* 13.9* 9.3 8.9 8.1  NEUTROABS 15.1*  --   --   --  7.5  HGB 12.8* 11.1* 11.3* 11.2* 11.1*  HCT 35.7* 32.4* 32.5* 31.5* 30.1*  MCV 112.3* 114.1* 114.8* 111.3* 106.7*  PLT 193 158 153 153 134*    Basic Metabolic Panel: Recent Labs  Lab 09/19/22 1150 09/20/22 0631 09/21/22 0543 09/22/22 0907 09/22/22 2333 09/22/22 2335 09/23/22 0255  NA 127* 125* 123* 124* 120*  --  121*  K 3.6 3.4* 3.3* 3.0*  2.7*  --  2.9*  CL 91* 93* 94* 93* 89*  --  94*  CO2 23 20* 15* 18* 15*  --  18*  GLUCOSE 110* 79 74 77 82  --  91  BUN --  13  CREATININE 0.99 0.85 0.74 0.72 0.87  --  0.89  CALCIUM 9.7 8.7* 8.3* 8.4* 7.8*  --  7.8*  MG 1.9  --  1.7 1.5*  --  1.9 2.0    GFR: Estimated Creatinine Clearance: 82.2 mL/min (by C-G formula based on SCr of 0.89 mg/dL). Liver Function Tests: Recent Labs  Lab 09/19/22 1244 09/22/22 2333  AST 148* 392*  ALT 36 86*  ALKPHOS 54  58  BILITOT 0.7 1.4*  PROT 8.3* 5.6*  ALBUMIN 4.5 2.6*    No results for input(s): "LIPASE", "AMYLASE" in the last 168 hours. No results for input(s): "AMMONIA" in the last 168 hours. Coagulation Profile: No results for input(s): "INR", "PROTIME" in the last 168 hours. Cardiac Enzymes: Recent Labs  Lab 09/19/22 1424 09/20/22 0631  CKTOTAL 6,277* 3,688*    BNP (last 3 results) No results for input(s): "PROBNP" in the last 8760 hours. HbA1C: No results for input(s): "HGBA1C" in the last 72 hours. CBG: No results for input(s): "GLUCAP" in the last 168 hours. Lipid Profile: No results for input(s): "CHOL", "HDL", "LDLCALC", "TRIG", "CHOLHDL", "LDLDIRECT" in the last 72 hours. Thyroid Function Tests: No results for input(s): "TSH", "T4TOTAL", "FREET4", "T3FREE", "THYROIDAB" in the last 72 hours. Anemia Panel: No results for input(s): "VITAMINB12", "FOLATE", "FERRITIN", "TIBC", "IRON", "RETICCTPCT" in the last 72 hours. Sepsis Labs: Recent Labs  Lab 09/19/22 1423 09/22/22 2333 09/22/22 2335 09/23/22 0144  PROCALCITON  --  6.43  --   --   LATICACIDVEN 0.8 0.9 0.9 1.1     Recent Results (from the past 240 hour(s))  Culture, Urine (Do not remove urinary catheter, catheter placed by urology or difficult to place)     Status: Abnormal   Collection Time: 09/19/22 11:50 AM   Specimen: Urine, Catheterized  Result Value Ref Range Status   Specimen Description   Final    URINE, CATHETERIZED Performed at  [facility], [address] [place], [place] [address]    Special Requests   Final    NONE Performed at [facility], [address], [place], [place] [address]    Culture (A)  Final    >=100,000 COLONIES/mL ESCHERICHIA COLI 80,000 COLONIES/mL ENTEROCOCCUS FAECALIS    Report Status 09/23/2022 FINAL  Final   Organism ID, Bacteria ENTEROCOCCUS FAECALIS (A)  Final   Organism ID, Bacteria ESCHERICHIA COLI (A)  Final      Susceptibility   Escherichia coli - MIC*    AMPICILLIN 8 SENSITIVE Sensitive     CEFAZOLIN <=4 SENSITIVE Sensitive     CEFTRIAXONE <=0.25 SENSITIVE Sensitive     CIPROFLOXACIN <=0.25 SENSITIVE Sensitive     GENTAMICIN <=1 SENSITIVE Sensitive     IMIPENEM <=0.25 SENSITIVE Sensitive     NITROFURANTOIN <=16 SENSITIVE Sensitive     TRIMETH/SULFA <=20 SENSITIVE Sensitive     AMPICILLIN/SULBACTAM 8 SENSITIVE Sensitive     * >=100,000 COLONIES/mL ESCHERICHIA COLI   Enterococcus faecalis - MIC*    AMPICILLIN <=2 SENSITIVE Sensitive     NITROFURANTOIN <=16 SENSITIVE Sensitive     VANCOMYCIN 2 SENSITIVE Sensitive     * 80,000 COLONIES/mL ENTEROCOCCUS FAECALIS  Culture, blood (Routine X 2) w Reflex to ID Panel     Status: None (Preliminary result)   Collection Time: 09/20/22 11:48 AM   Specimen: BLOOD  Result Value Ref Range Status   Specimen Description   Final    BLOOD BLOOD RIGHT ARM AEROBIC BOTTLE ONLY Performed at [facility], [address] [place], [place] [address]    Special Requests   Final    BOTTLES DRAWN AEROBIC ONLY Blood Culture adequate volume Performed at [facility], [address], [place], [place] [address]    Culture   Final    NO GROWTH 3 DAYS Performed at [facility], [address], [place], [place] [address]    Report Status PENDING  Incomplete  Culture,  blood (Routine X 2) w Reflex to ID Panel     Status: None (Preliminary result)   Collection Time:  09/20/22 11:48 AM   Specimen: BLOOD  Result Value Ref Range Status   Specimen Description   Final    BLOOD BLOOD RIGHT HAND Performed at [facility], [address] [place], [place] [address]    Special Requests   Final    BOTTLES DRAWN AEROBIC ONLY Blood Culture adequate volume Performed at [facility], [address] [place], [place] [address]    Culture   Final    NO GROWTH 3 DAYS Performed at [facility], [address] [place], [place] [address]    Report Status PENDING  Incomplete  MRSA Next Gen by PCR, Nasal     Status: None   Collection Time: 09/22/22 11:10 PM   Specimen: Nasal Mucosa; Nasal Swab  Result Value Ref Range Status   MRSA by PCR Next Gen NOT DETECTED NOT DETECTED Final    Comment: (NOTE) The GeneXpert MRSA Assay (FDA approved for NASAL specimens only), is one component of a comprehensive MRSA colonization surveillance program. It is not intended to diagnose MRSA infection nor to guide or monitor treatment for MRSA infections. Test performance is not FDA approved in patients less than 16 years old. Performed at [facility], [address] [place], [place] [address]   SARS Coronavirus 2 by RT PCR (hospital order, performed in [facility] hospital lab) *cepheid single result test* Anterior Nasal Swab     Status: None   Collection Time: 09/23/22  8:00 AM   Specimen: Anterior Nasal Swab  Result Value Ref Range Status   SARS Coronavirus 2 by RT PCR NEGATIVE NEGATIVE Final    Comment: (NOTE) SARS-CoV-2 target nucleic acids are NOT DETECTED.  The SARS-CoV-2 RNA is generally detectable in upper and lower respiratory specimens during the acute phase of infection. The lowest concentration of SARS-CoV-2 viral copies this assay can detect is 250 copies / mL. A negative result does not preclude SARS-CoV-2 infection and should not be used as the sole basis for treatment or other patient management  decisions.  A negative result may occur with improper specimen collection / handling, submission of specimen other than nasopharyngeal swab, presence of viral mutation(s) within the areas targeted by this assay, and inadequate number of viral copies (<250 copies / mL). A negative result must be combined with clinical observations, patient history, and epidemiological information.  Fact Sheet for Patients:   RoadLapTop.co.za  Fact Sheet for Healthcare Providers: http://kim-miller.com/  This test is not yet approved or  cleared by the [place] FDA and has been authorized for detection and/or diagnosis of SARS-CoV-2 by FDA under an Emergency Use Authorization (EUA).  This EUA will remain in effect (meaning this test can be used) for the duration of the COVID-19 declaration under Section 564(b)(1) of the Act, 21 U.S.C. section 360bbb-3(b)(1), unless the authorization is terminated or revoked sooner.  Performed at [facility], [address] [place], [place] [address]       Radiology Studies: CT Angio Chest Pulmonary Embolism (PE) W or WO Contrast  Result Date: 09/23/2022 CLINICAL DATA:  62 year old male with respiratory illness. Weakness. Abnormal left lung on portable x-ray yesterday. History of prostate cancer. EXAM: CT ANGIOGRAPHY CHEST WITH CONTRAST TECHNIQUE: Multidetector CT imaging of the chest was performed using the standard protocol during bolus administration of intravenous contrast. Multiplanar CT image reconstructions and  MIPs were obtained to evaluate the vascular anatomy. RADIATION DOSE REDUCTION: This exam was performed according to the departmental dose-optimization program which includes automated exposure control, adjustment of the mA and/or kV according to patient size and/or use of iterative reconstruction technique. CONTRAST:  75mL OMNIPAQUE IOHEXOL 350 MG/ML SOLN COMPARISON:  Portable chest x-ray  09/22/2022. CT Abdomen and Pelvis 07/12/2021. FINDINGS: Cardiovascular: Adequate contrast bolus timing in the pulmonary arterial tree. Respiratory motion. No central or saddle pulmonary artery filling defect. No lobar embolus. No convincing filling defect in the visible segmental and subsegmental pulmonary artery branches. Heart size remains normal. No pericardial effusion. Calcified aortic atherosclerosis. Extensive calcified coronary artery plaque or stent. And also there is a left side SVC anatomic variant. Mediastinum/Nodes: Negative for mediastinal mass. No significant lymphadenopathy. Lungs/Pleura: Small or trace layering left pleural effusion with simple fluid density. Superimposed subtotal left lower lobe consolidation. Subtotal left upper lobe combined ground-glass and early consolidation. And multifocal similar mostly sub solid peribronchial and scattered right lung opacity affecting the upper lobe, medial segment right middle lobe, superior segment right lower lobe. Major airways remain patent. Upper Abdomen: Negative visible mostly noncontrast liver, spleen, pancreas, adrenal glands and kidneys. Negative visible bowel. No free air or free fluid in the upper abdomen. Musculoskeletal: Widely scattered sclerotic bone metastases throughout the visible spine, ribs, sternum, and also the right scapula. Appearance is similar to the CT Abdomen and Pelvis last year. No thoracic vertebral pathologic fracture identified. However, there is a minimally displaced fracture of the left posterolateral 10th rib (series 6, image 141), although this fracture is not in the area of sclerotic metastasis of that rib. And there is also evidence of a subtle acute left 11th rib fracture nearby. Furthermore, subtle left lateral 9th rib fracture on series 6 appears un healed. These appear posttraumatic. Review of the MIP images confirms the above findings. IMPRESSION: 1. Respiratory motion.  No pulmonary embolus identified. 2.  Severe multilobar Pneumonia is bilateral, worse in the left lung. Associated trace layering left pleural effusion. 3. Chronic sclerotic skeletal metastatic disease. But superimposed posttraumatic acute or subacute fractures of the Left ribs 9 through 11. 4. Aortic Atherosclerosis (ICD10-I70.0). Coronary artery atherosclerosis. Electronically Signed   By: Odessa Fleming M.D.   On: 09/23/2022 04:44   DG Chest Port 1 View  Result Date: 09/22/2022 CLINICAL DATA:  Respiratory illness. EXAM: PORTABLE CHEST 1 VIEW COMPARISON:  Chest radiograph dated 09/19/2022. FINDINGS: Diffuse airspace density throughout the left lung most consistent with developing infiltrate. Asymmetric edema is less likely. Clinical correlation is recommended. The right lung is clear. A small left pleural effusion suspected. No pneumothorax. The cardiac silhouette is within normal limits. No acute osseous pathology. IMPRESSION: Findings most consistent with developing infiltrate throughout the left lung. Electronically Signed   By: Elgie Collard M.D.   On: 09/22/2022 22:32      Scheduled Meds:  amLODipine  10 mg Oral Daily   atorvastatin  40 mg Oral Daily   Chlorhexidine Gluconate Cloth  6 each Topical Daily   enoxaparin (LOVENOX) injection  40 mg Subcutaneous Q24H   folic acid  1 mg Oral Daily   lidocaine  1 patch Transdermal Q24H   methylPREDNISolone (SOLU-MEDROL) injection  80 mg Intravenous Daily   mometasone-formoterol  2 puff Inhalation BID   multivitamin with minerals  1 tablet Oral Daily   nicotine  21 mg Transdermal Daily   nitrofurantoin (macrocrystal-monohydrate)  100 mg Oral Q12H   sodium chloride  1 g Oral BID  WC   thiamine  100 mg Oral Daily   Or   thiamine  100 mg Intravenous Daily   Continuous Infusions:  ceFEPime (MAXIPIME) IV Stopped (09/23/22 0819)     LOS: 3 days   Time spent: 25 minutes   Noralee Stain, DO [facility] 09/23/2022, 1:29 PM   Available via Epic secure chat 7am-7pm After these  hours, please refer to coverage provider listed on [email or web address]

## 2022-09-23 NOTE — Progress Notes (Signed)
    Patient Name: David Townsend           DOB: 1960/06/10  MRN: [id number]      Admission Date: 09/19/2022  Attending Provider: Noralee Stain, DO  Primary Diagnosis: Sepsis secondary to UTI   Level of care: Stepdown    CROSS COVER NOTE   Date of Service   09/23/2022   David Townsend, 62 y.o. male, was admitted on 09/19/2022 for Sepsis secondary to UTI.  Patient currently on cefepime.   HPI/Events of Note   Bedside RN reports patient febrile,102.5 F, and tachypneic with RR 30s satting 80% on room air.   Patient was placed on high flow [place].  Now requiring 10 L oxygen. Chest x-ray, nebulizer, and VBG initially ordered.   Patient to be transferred to stepdown unit for further monitoring.  During bedside assessment, patient is alert and oriented x 4.  Does not appear to be in respiratory distress.  He denies chest pain, palpitations, shortness of breath, abdominal discomfort.   Chest x-ray -->  newly developed infiltrates throughout the left lung.  Right lung clear, left lung diminished.   VBG pH 7.44, pCO2 21, pO2 59, bicarb 14.3.  Interpreted as respiratory alkalosis with secondary metabolic acidosis.    0030- BP 92/32 (52).  Hemoglobin 11.1, no indication of bleeding.  500 cc Fluid bolus ordered.    0200- Labs reviewed.  Potassium 2.9, Mag 2.0, Lactic acid negative.  Procalcitonin 6.43.  D-dimer elevated 3.65. Orders placed for potassium replacement, CTA to rule out PE.   0515- CTA CHEST  IMPRESSION: 1. Respiratory motion.  No pulmonary embolus identified. 2. Severe multilobar Pneumonia is bilateral, worse in the left lung. Associated trace layering left pleural effusion. 3. Chronic sclerotic skeletal metastatic disease. But superimposed posttraumatic acute or subacute fractures of the Left ribs 9 through 11. 4. Aortic Atherosclerosis (ICD10-I70.0). Coronary artery atherosclerosis.   Interventions/ Plan   Chest x-ray VBG Nebulizing treatment Labs- CBC, CMP, lactic acid,  procalcitonin, D-dimer, BNP Fluid bolus        David Harada, DNP, ACNPC- AG [facility] [place]

## 2022-09-23 NOTE — Progress Notes (Signed)
Patient transported to and from CT without incident.

## 2022-09-24 ENCOUNTER — Ambulatory Visit: Payer: 59

## 2022-09-24 DIAGNOSIS — A419 Sepsis, unspecified organism: Secondary | ICD-10-CM | POA: Diagnosis not present

## 2022-09-24 DIAGNOSIS — N390 Urinary tract infection, site not specified: Secondary | ICD-10-CM | POA: Diagnosis not present

## 2022-09-24 LAB — CBC
HCT: 27.6 % — ABNORMAL LOW (ref 39.0–52.0)
Hemoglobin: 9.9 g/dL — ABNORMAL LOW (ref 13.0–17.0)
MCH: 39.3 pg — ABNORMAL HIGH (ref 26.0–34.0)
MCHC: 35.9 g/dL (ref 30.0–36.0)
MCV: 109.5 fL — ABNORMAL HIGH (ref 80.0–100.0)
Platelets: 143 10*3/uL — ABNORMAL LOW (ref 150–400)
RBC: 2.52 MIL/uL — ABNORMAL LOW (ref 4.22–5.81)
RDW: 11.9 % (ref 11.5–15.5)
WBC: 8.5 10*3/uL (ref 4.0–10.5)
nRBC: 0 % (ref 0.0–0.2)

## 2022-09-24 LAB — CULTURE, BLOOD (ROUTINE X 2)

## 2022-09-24 LAB — BASIC METABOLIC PANEL
Anion gap: 11 (ref 5–15)
BUN: 21 mg/dL (ref 8–23)
CO2: 18 mmol/L — ABNORMAL LOW (ref 22–32)
Calcium: 8.5 mg/dL — ABNORMAL LOW (ref 8.9–10.3)
Chloride: 98 mmol/L (ref 98–111)
Creatinine, Ser: 0.65 mg/dL (ref 0.61–1.24)
GFR, Estimated: >60 mL/min (ref >60)
Glucose, Bld: 155 mg/dL — ABNORMAL HIGH (ref 70–99)
Potassium: 3.4 mmol/L — ABNORMAL LOW (ref 3.5–5.1)
Sodium: 127 mmol/L — ABNORMAL LOW (ref 135–145)

## 2022-09-24 LAB — MAGNESIUM: Magnesium: 2.2 mg/dL (ref 1.7–2.4)

## 2022-09-24 MED ORDER — ATORVASTATIN CALCIUM 40 MG PO TABS: 20.0000 mg | ORAL_TABLET | Freq: Every day | ORAL | Status: DC

## 2022-09-24 MED ORDER — CHLORHEXIDINE GLUCONATE CLOTH 2 % EX PADS
6.0000 | MEDICATED_PAD | Freq: Every day | CUTANEOUS | Status: DC
  Administered 2022-09-24 – 2022-09-27 (×4): 6 via TOPICAL

## 2022-09-24 MED ORDER — POTASSIUM CHLORIDE CRYS ER 20 MEQ PO TBCR
40.0000 meq | EXTENDED_RELEASE_TABLET | Freq: Once | ORAL | Status: AC
  Administered 2022-09-24: 40 meq via ORAL
  Filled 2022-09-24: qty 2

## 2022-09-24 NOTE — Progress Notes (Signed)
Physical Therapy Treatment Patient Details Name: David Townsend MRN: [id number] DOB: Dec 08, 1960 Today's Date: 09/24/2022   History of Present Illness Pt is 62 yo male presented on 09/19/22 after a fall and generalized weakness.  Pt admitted with sepsis secondary to UTI and rhabdomyolysis. Left ribs 9-11 fractured.  Additionally, pt with ETOH abuse an on CIWA protocol. 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 to SDU 4/20    PT Comments    The patient is resting in bed, lateral lean to the left and head tilted to left. Patient has difficulty repositioning to midline with out assistance.  Patient required mod assistance  for bed mobility and +2 mod to stand at Rw, again, leaning to left. Patient required mod assistance of to ambulate x 10', poor stepping, wide base and ataxic.  Patient was maintained  on 8 L HFNC, SPO2 dropped to 84 % from 95%, HR 100's,  RR 34, patient returned to 90% after resting in recliner. Continues to have left lean bias.  Recommendations for follow up therapy are one component of a multi-disciplinary discharge planning process, led by the attending physician.  Recommendations may be updated based on patient status, additional functional criteria and insurance authorization.  Follow Up Recommendations  Can patient physically be transported by private vehicle: No    Assistance Recommended at Discharge    Patient can return home with the following Two people to help with walking and/or transfers;A lot of help with bathing/dressing/bathroom;Assistance with cooking/housework;Direct supervision/assist for financial management;Assist for transportation;Help with stairs or ramp for entrance   Equipment Recommendations  Rolling walker (2 wheels)    Recommendations for Other Services       Precautions / Restrictions Precautions Precautions:  Fall Precaution Comments: foley, On 8 L HFNC,     Mobility  Bed Mobility   Bed Mobility: Supine to Sit     Supine to sit: Min assist     General bed mobility comments: Increased time with assist and cues to scoot forward to sit.    Transfers   Equipment used: Rolling walker (2 wheels) Transfers: Sit to/from Stand Sit to Stand: Mod assist, +2 safety/equipment, +2 physical assistance   Step pivot transfers: Mod assist, +2 safety/equipment, +2 physical assistance       General transfer comment: patient leans to the left  whenstanding, does not shift to midline    Ambulation/Gait Ambulation/Gait assistance: Mod assist, +2 safety/equipment, +2 physical assistance Gait Distance (Feet): 10 Feet Assistive device: Rolling walker (2 wheels) Gait Pattern/deviations: Step-to pattern, Decreased stride length, Shuffle       General Gait Details: Patient listing to left and does not stand erect, head also laterall lean, Poor step length on left, required mod assistance for balance throughout   Stairs             Wheelchair Mobility    Modified Rankin (Stroke Patients Only)       Balance Overall balance assessment: Needs assistance, History of Falls Sitting-balance support: Feet supported, Bilateral upper extremity supported, No upper extremity supported Sitting balance-Leahy Scale: Poor Sitting balance - Comments: lateral lean of trunk and head Postural control: Posterior lean, Left lateral lean Standing balance support: Reliant on assistive device for balance, Bilateral upper extremity supported Standing balance-Leahy Scale: Poor Standing balance comment: Requiring RW and mod  Cognition Arousal/Alertness: Awake/alert Behavior During Therapy: Restless, Anxious Overall Cognitive Status: Impaired/Different from baseline Area of Impairment: Problem solving, Safety/judgement                   Current Attention Level:  Alternating   Following Commands: Follows one step commands consistently Safety/Judgement: Decreased awareness of safety Awareness: Emergent Problem Solving: Requires verbal cues, Requires tactile cues General Comments: Pt mildly impulsive (particularly with lines) but improves with cues.        Exercises      General Comments        Pertinent Vitals/Pain Pain Assessment Faces Pain Scale: Hurts little more Pain Location: left side Pain Descriptors / Indicators: Discomfort, Sore Pain Intervention(s): Monitored during session    Home Living                          Prior Function            PT Goals (current goals can now be found in the care plan section) Progress towards PT goals: Progressing toward goals    Frequency    Min 1X/week      PT Plan Current plan remains appropriate    Co-evaluation              AM-PAC PT "6 Clicks" Mobility   Outcome Measure  Help needed turning from your back to your side while in a flat bed without using bedrails?: A Lot Help needed moving from lying on your back to sitting on the side of a flat bed without using bedrails?: A Lot Help needed moving to and from a bed to a chair (including a wheelchair)?: A Lot Help needed standing up from a chair using your arms (e.g., wheelchair or bedside chair)?: A Lot Help needed to walk in hospital room?: Total Help needed climbing 3-5 steps with a railing? : Total 6 Click Score: 10    End of Session Equipment Utilized During Treatment: Gait belt;Oxygen Activity Tolerance: Patient limited by fatigue;Treatment limited secondary to medical complications (Comment) Patient left: in chair;with call bell/phone within reach;with chair alarm set Nurse Communication: Mobility status PT Visit Diagnosis: Other abnormalities of gait and mobility (R26.89);Muscle weakness (generalized) (M62.81);History of falling (Z91.81)     Time: 0981-1914 PT Time Calculation (min) (ACUTE  ONLY): 29 min  Charges:  $Gait Training: 23-37 mins                     Blanchard Kelch PT Acute Rehabilitation Services Office [phone] Weekend pager-[phone]     Rada Hay 09/24/2022, 1:03 PM

## 2022-09-24 NOTE — Progress Notes (Addendum)
PROGRESS NOTE    David Townsend  BJY:[id number] DOB: [birth year] DOA: 09/19/2022 PCP: Rema Fendt, NP     Brief Narrative:  David Townsend is a 62 y.o. 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    Reports a few days ago he felt generalized weakness and fell in the bathroom. Had to crawl to get back into bed. Mostly has been in bed since. Today slipped out of bed and laid on floor for 3 hours unable to get up. States he has abdominal pain but thinks it is due to straining to try and get up off the floor.   In the emergency department, patient was found to have UTI and was started on empiric antibiotic.  His Foley catheter was not exchanged, patient declined.  Overnight 4/20, patient became hypoxic.  He required up to 10 L high flow oxygen.  CTA chest was completed which revealed multilobar pneumonia bilaterally.  He was transferred to stepdown unit.  New events last 24 hours / Subjective: Patient continues to have a cough, hypotension has been helping.  He is now weaned to 5 L oxygen.  He denies any worsening shortness of breath today.  Continues to be very weak.  Assessment & Plan:   Principal Problem:   Sepsis secondary to UTI Active Problems:   ETOH abuse   Hyperlipidemia   Prostate cancer metastatic to bone   Tobacco abuse   Essential hypertension   Rhabdomyolysis   Hyponatremia   Sepsis secondary to UTI, HCAP -UTI related with chronic indwelling Foley catheter -Sepsis present on admission with fever, tachycardia -COVID negative, MRSA PCR negative -Blood culture negative to date -Leukocytosis resolved -Urine culture showing E. Coli, enterococcus --> Macrobid -Rocephin --> cefepime to cover HCAP   Acute hypoxemic respiratory failure -Overnight 4/20-21, he became hypoxic, saturating 80% on room air.   Required up to 10 L high flow oxygen.  Transfer to stepdown unit. -Secondary to HCAP -Remains on 5 L nasal cannula O2 today -Continue Solu-Medrol, Dulera. He has significant smoking history.  Fall with generalized weakness -PT OT, suspect patient may need placement in SNF for discharge  Hypotonic hyponatremia -Serum osmole 273 -Urine sodium 123 -Urine osmole 493  -Consistent with SIADH, consider potomania.  Placed on fluid restriction diet. -Continue salt tabs  -Monitor BMP, improved  Hypokalemia -Replace  Rhabdomyolysis -Improved with IV fluid  Metastatic prostate cancer -Followed by Dr. Berneice Heinrich, [facility] -Lavone Neri   Hypertension -Amlodipine, Avapro - on hold due to hyponatremia   Hyperlipidemia -Lipitor  Alcohol abuse -CIWA protocol, B1, folic acid  Tobacco abuse -Nicotine patch    DVT prophylaxis:  enoxaparin (LOVENOX) injection 40 mg Start: 09/20/22 1000  Code Status: Full code Family Communication: None at bedside Disposition Plan: Home versus SNF Status is: Inpatient Remains inpatient appropriate because: IV antibiotics, O2    Antimicrobials:  Anti-infectives (From admission, onward)    Start     Dose/Rate Route Frequency Ordered Stop   09/23/22 1200  vancomycin (VANCOCIN) IVPB 1000 mg/200 mL premix  Status:  Discontinued        1,000 mg 200 mL/hr over 60 Minutes Intravenous Every 12 hours 09/22/22 2336 09/23/22 0743   09/23/22 1000  nitrofurantoin (macrocrystal-monohydrate) (MACROBID) capsule 100 mg        100 mg Oral Every 12 hours 09/23/22 0743     09/23/22 0015  vancomycin (VANCOREADY) IVPB  1250 mg/250 mL        1,250 mg 166.7 mL/hr over 90 Minutes Intravenous  Once 09/22/22 2325 09/23/22 0326   09/22/22 2345  ceFEPIme (MAXIPIME) 2 g in sodium chloride 0.9 % 100 mL IVPB        2 g 200 mL/hr over 30 Minutes Intravenous Every 8 hours 09/22/22 2336     09/22/22 1200  ceFEPIme (MAXIPIME) 2 g in sodium chloride 0.9 % 100 mL IVPB  Status:   Discontinued        2 g 200 mL/hr over 30 Minutes Intravenous Every 12 hours 09/22/22 1121 09/22/22 2336   09/20/22 2000  cefTRIAXone (ROCEPHIN) 1 g in sodium chloride 0.9 % 100 mL IVPB  Status:  Discontinued        1 g 200 mL/hr over 30 Minutes Intravenous Every 24 hours 09/20/22 1038 09/22/22 1121   09/19/22 2200  ciprofloxacin (CIPRO) IVPB 200 mg  Status:  Discontinued        200 mg 100 mL/hr over 60 Minutes Intravenous Every 12 hours 09/19/22 2133 09/20/22 1038        Objective: Vitals:   09/24/22 0800 09/24/22 0835 09/24/22 0900 09/24/22 0949  BP: 133/69  (!) 154/67 (!) 154/67  Pulse: 72  90   Resp: (!) 23  (!) 36   Temp:  97.6 F (36.4 C)    TempSrc:  Oral    SpO2: 94%  92%   Weight:      Height:        Intake/Output Summary (Last 24 hours) at 09/24/2022 1022 Last data filed at 09/24/2022 0915 Gross per 24 hour  Intake 879.16 ml  Output 1475 ml  Net -595.84 ml    Filed Weights   09/19/22 1128 09/22/22 2245  Weight: 70.3 kg 66.7 kg    Examination:  General exam: Appears calm and comfortable  Respiratory system: Without distress, clear to auscultation anteriorly Cardiovascular system: NSR. No pedal edema. Gastrointestinal system: Abdomen is nondistended, soft and nontender Central nervous system: Alert and oriented.  Extremities: Symmetric in appearance  Skin: No rashes, lesions or ulcers on exposed skin  Psychiatry: Judgement and insight appear normal. Mood & affect appropriate.   Data Reviewed: I have personally reviewed following labs and imaging studies  CBC: Recent Labs  Lab 09/19/22 1150 09/20/22 0631 09/21/22 0543 09/22/22 0907 09/22/22 2333 09/24/22 0258  WBC 16.6* 13.9* 9.3 8.9 8.1 8.5  NEUTROABS 15.1*  --   --   --  7.5  --   HGB 12.8* 11.1* 11.3* 11.2* 11.1* 9.9*  HCT 35.7* 32.4* 32.5* 31.5* 30.1* 27.6*  MCV 112.3* 114.1* 114.8* 111.3* 106.7* 109.5*  PLT 193 158 153 153 134* 143*    Basic Metabolic Panel: Recent Labs  Lab  09/21/22 0543 09/22/22 0907 09/22/22 2333 09/22/22 2335 09/23/22 0255 09/24/22 0258  NA 123* 124* 120*  --  121* 127*  K 3.3* 3.0* 2.7*  --  2.9* 3.4*  CL 94* 93* 89*  --  94* 98  CO2 15* 18* 15*  --  18* 18*  GLUCOSE 74 77 82  --  91 155*  BUN 13 9 11   --  13 21  CREATININE 0.74 0.72 0.87  --  0.89 0.65  CALCIUM 8.3* 8.4* 7.8*  --  7.8* 8.5*  MG 1.7 1.5*  --  1.9 2.0 2.2    GFR: Estimated Creatinine Clearance: 91.5 mL/min (by C-G formula based on SCr of 0.65 mg/dL). Liver Function Tests: Recent Labs  Lab 09/19/22 1244 09/22/22 2333  AST 148* 392*  ALT 36 86*  ALKPHOS 54 58  BILITOT 0.7 1.4*  PROT 8.3* 5.6*  ALBUMIN 4.5 2.6*    No results for input(s): "LIPASE", "AMYLASE" in the last 168 hours. No results for input(s): "AMMONIA" in the last 168 hours. Coagulation Profile: No results for input(s): "INR", "PROTIME" in the last 168 hours. Cardiac Enzymes: Recent Labs  Lab 09/19/22 1424 09/20/22 0631  CKTOTAL 6,277* 3,688*    BNP (last 3 results) No results for input(s): "PROBNP" in the last 8760 hours. HbA1C: No results for input(s): "HGBA1C" in the last 72 hours. CBG: No results for input(s): "GLUCAP" in the last 168 hours. Lipid Profile: No results for input(s): "CHOL", "HDL", "LDLCALC", "TRIG", "CHOLHDL", "LDLDIRECT" in the last 72 hours. Thyroid Function Tests: No results for input(s): "TSH", "T4TOTAL", "FREET4", "T3FREE", "THYROIDAB" in the last 72 hours. Anemia Panel: No results for input(s): "VITAMINB12", "FOLATE", "FERRITIN", "TIBC", "IRON", "RETICCTPCT" in the last 72 hours. Sepsis Labs: Recent Labs  Lab 09/19/22 1423 09/22/22 2333 09/22/22 2335 09/23/22 0144  PROCALCITON  --  6.43  --   --   LATICACIDVEN 0.8 0.9 0.9 1.1     Recent Results (from the past 240 hour(s))  Culture, Urine (Do not remove urinary catheter, catheter placed by urology or difficult to place)     Status: Abnormal   Collection Time: 09/19/22 11:50 AM   Specimen: Urine,  Catheterized  Result Value Ref Range Status   Specimen Description   Final    URINE, CATHETERIZED Performed at [facility], [address], [place], [place] [address]    Special Requests   Final    NONE Performed at [facility], [address] [place], [place] [address]    Culture (A)  Final    >=100,000 COLONIES/mL ESCHERICHIA COLI 80,000 COLONIES/mL ENTEROCOCCUS FAECALIS    Report Status 09/23/2022 FINAL  Final   Organism ID, Bacteria ENTEROCOCCUS FAECALIS (A)  Final   Organism ID, Bacteria ESCHERICHIA COLI (A)  Final      Susceptibility   Escherichia coli - MIC*    AMPICILLIN 8 SENSITIVE Sensitive     CEFAZOLIN <=4 SENSITIVE Sensitive     CEFTRIAXONE <=0.25 SENSITIVE Sensitive     CIPROFLOXACIN <=0.25 SENSITIVE Sensitive     GENTAMICIN <=1 SENSITIVE Sensitive     IMIPENEM <=0.25 SENSITIVE Sensitive     NITROFURANTOIN <=16 SENSITIVE Sensitive     TRIMETH/SULFA <=20 SENSITIVE Sensitive     AMPICILLIN/SULBACTAM 8 SENSITIVE Sensitive     * >=100,000 COLONIES/mL ESCHERICHIA COLI   Enterococcus faecalis - MIC*    AMPICILLIN <=2 SENSITIVE Sensitive     NITROFURANTOIN <=16 SENSITIVE Sensitive     VANCOMYCIN 2 SENSITIVE Sensitive     * 80,000 COLONIES/mL ENTEROCOCCUS FAECALIS  Culture, blood (Routine X 2) w Reflex to ID Panel     Status: None (Preliminary result)   Collection Time: 09/20/22 11:48 AM   Specimen: BLOOD  Result Value Ref Range Status   Specimen Description   Final    BLOOD BLOOD RIGHT ARM AEROBIC BOTTLE ONLY Performed at [facility], [address] [place], [place] [address]    Special Requests   Final    BOTTLES DRAWN AEROBIC ONLY Blood Culture adequate volume Performed at [facility], [address] [place], [place] [address]    Culture   Final    NO GROWTH 4 DAYS Performed at [facility], [address], [place], [place] [address]    Report Status PENDING  Incomplete   Culture, blood (Routine X 2) w Reflex to ID Panel     Status: None (Preliminary result)   Collection Time: 09/20/22 11:48 AM   Specimen: BLOOD  Result Value Ref Range Status   Specimen Description   Final    BLOOD BLOOD RIGHT HAND Performed at [facility], [address], [place], [place] [address]    Special Requests   Final    BOTTLES DRAWN AEROBIC ONLY Blood Culture adequate volume Performed at [facility], [address], [place], [place] [address]    Culture   Final    NO GROWTH 4 DAYS Performed at [facility], [address] [place], [place] [address]    Report Status PENDING  Incomplete  MRSA Next Gen by PCR, Nasal     Status: None   Collection Time: 09/22/22 11:10 PM   Specimen: Nasal Mucosa; Nasal Swab  Result Value Ref Range Status   MRSA by PCR Next Gen NOT DETECTED NOT DETECTED Final    Comment: (NOTE) The GeneXpert MRSA Assay (FDA approved for NASAL specimens only), is one component of a comprehensive MRSA colonization surveillance program. It is not intended to diagnose MRSA infection nor to guide or monitor treatment for MRSA infections. Test performance is not FDA approved in patients less than 75 years old. Performed at [facility], [address] [place], [place] [address]   SARS Coronavirus 2 by RT PCR (hospital order, performed in [facility] hospital lab) *cepheid single result test* Anterior Nasal Swab     Status: None   Collection Time: 09/23/22  8:00 AM   Specimen: Anterior Nasal Swab  Result Value Ref Range Status   SARS Coronavirus 2 by RT PCR NEGATIVE NEGATIVE Final    Comment: (NOTE) SARS-CoV-2 target nucleic acids are NOT DETECTED.  The SARS-CoV-2 RNA is generally detectable in upper and lower respiratory specimens during the acute phase of infection. The lowest concentration of SARS-CoV-2 viral copies this assay can detect is 250 copies / mL. A negative result does not  preclude SARS-CoV-2 infection and should not be used as the sole basis for treatment or other patient management decisions.  A negative result may occur with improper specimen collection / handling, submission of specimen other than nasopharyngeal swab, presence of viral mutation(s) within the areas targeted by this assay, and inadequate number of viral copies (<250 copies / mL). A negative result must be combined with clinical observations, patient history, and epidemiological information.  Fact Sheet for Patients:   RoadLapTop.co.za  Fact Sheet for Healthcare Providers: http://kim-miller.com/  This test is not yet approved or  cleared by the [place] FDA and has been authorized for detection and/or diagnosis of SARS-CoV-2 by FDA under an Emergency Use Authorization (EUA).  This EUA will remain in effect (meaning this test can be used) for the duration of the COVID-19 declaration under Section 564(b)(1) of the Act, 21 U.S.C. section 360bbb-3(b)(1), unless the authorization is terminated or revoked sooner.  Performed at [facility], [address] [place], [place] [address]       Radiology Studies: CT Angio Chest Pulmonary Embolism (PE) W or WO Contrast  Result Date: 09/23/2022 CLINICAL DATA:  62 year old male with respiratory illness. Weakness. Abnormal left lung on portable x-ray yesterday. History of prostate cancer. EXAM: CT ANGIOGRAPHY CHEST WITH CONTRAST TECHNIQUE: Multidetector CT imaging of the chest was  performed using the standard protocol during bolus administration of intravenous contrast. Multiplanar CT image reconstructions and MIPs were obtained to evaluate the vascular anatomy. RADIATION DOSE REDUCTION: This exam was performed according to the departmental dose-optimization program which includes automated exposure control, adjustment of the mA and/or kV according to patient size and/or use of  iterative reconstruction technique. CONTRAST:  75mL OMNIPAQUE IOHEXOL 350 MG/ML SOLN COMPARISON:  Portable chest x-ray 09/22/2022. CT Abdomen and Pelvis 07/12/2021. FINDINGS: Cardiovascular: Adequate contrast bolus timing in the pulmonary arterial tree. Respiratory motion. No central or saddle pulmonary artery filling defect. No lobar embolus. No convincing filling defect in the visible segmental and subsegmental pulmonary artery branches. Heart size remains normal. No pericardial effusion. Calcified aortic atherosclerosis. Extensive calcified coronary artery plaque or stent. And also there is a left side SVC anatomic variant. Mediastinum/Nodes: Negative for mediastinal mass. No significant lymphadenopathy. Lungs/Pleura: Small or trace layering left pleural effusion with simple fluid density. Superimposed subtotal left lower lobe consolidation. Subtotal left upper lobe combined ground-glass and early consolidation. And multifocal similar mostly sub solid peribronchial and scattered right lung opacity affecting the upper lobe, medial segment right middle lobe, superior segment right lower lobe. Major airways remain patent. Upper Abdomen: Negative visible mostly noncontrast liver, spleen, pancreas, adrenal glands and kidneys. Negative visible bowel. No free air or free fluid in the upper abdomen. Musculoskeletal: Widely scattered sclerotic bone metastases throughout the visible spine, ribs, sternum, and also the right scapula. Appearance is similar to the CT Abdomen and Pelvis last year. No thoracic vertebral pathologic fracture identified. However, there is a minimally displaced fracture of the left posterolateral 10th rib (series 6, image 141), although this fracture is not in the area of sclerotic metastasis of that rib. And there is also evidence of a subtle acute left 11th rib fracture nearby. Furthermore, subtle left lateral 9th rib fracture on series 6 appears un healed. These appear posttraumatic. Review of  the MIP images confirms the above findings. IMPRESSION: 1. Respiratory motion.  No pulmonary embolus identified. 2. Severe multilobar Pneumonia is bilateral, worse in the left lung. Associated trace layering left pleural effusion. 3. Chronic sclerotic skeletal metastatic disease. But superimposed posttraumatic acute or subacute fractures of the Left ribs 9 through 11. 4. Aortic Atherosclerosis (ICD10-I70.0). Coronary artery atherosclerosis. Electronically Signed   By: Odessa Fleming M.D.   On: 09/23/2022 04:44   DG Chest Port 1 View  Result Date: 09/22/2022 CLINICAL DATA:  Respiratory illness. EXAM: PORTABLE CHEST 1 VIEW COMPARISON:  Chest radiograph dated 09/19/2022. FINDINGS: Diffuse airspace density throughout the left lung most consistent with developing infiltrate. Asymmetric edema is less likely. Clinical correlation is recommended. The right lung is clear. A small left pleural effusion suspected. No pneumothorax. The cardiac silhouette is within normal limits. No acute osseous pathology. IMPRESSION: Findings most consistent with developing infiltrate throughout the left lung. Electronically Signed   By: Elgie Collard M.D.   On: 09/22/2022 22:32      Scheduled Meds:  amLODipine  10 mg Oral Daily   atorvastatin  40 mg Oral Daily   Chlorhexidine Gluconate Cloth  6 each Topical Daily   enoxaparin (LOVENOX) injection  40 mg Subcutaneous Q24H   folic acid  1 mg Oral Daily   lidocaine  1 patch Transdermal Q24H   methylPREDNISolone (SOLU-MEDROL) injection  80 mg Intravenous Daily   mometasone-formoterol  2 puff Inhalation BID   multivitamin with minerals  1 tablet Oral Daily   nicotine  21 mg Transdermal Daily  nitrofurantoin (macrocrystal-monohydrate)  100 mg Oral Q12H   sodium chloride  1 g Oral BID WC   thiamine  100 mg Oral Daily   Or   thiamine  100 mg Intravenous Daily   Continuous Infusions:  ceFEPime (MAXIPIME) IV Stopped (09/24/22 0855)     LOS: 4 days   Time spent: 25 minutes    Noralee Stain, DO [facility] 09/24/2022, 10:22 AM   Available via Epic secure chat 7am-7pm After these hours, please refer to coverage provider listed on [email or web address]

## 2022-09-25 ENCOUNTER — Inpatient Hospital Stay (HOSPITAL_COMMUNITY): Payer: 59

## 2022-09-25 DIAGNOSIS — A419 Sepsis, unspecified organism: Secondary | ICD-10-CM | POA: Diagnosis not present

## 2022-09-25 DIAGNOSIS — N390 Urinary tract infection, site not specified: Secondary | ICD-10-CM | POA: Diagnosis not present

## 2022-09-25 LAB — COMPREHENSIVE METABOLIC PANEL
ALT: 274 U/L — ABNORMAL HIGH (ref 0–44)
AST: 916 U/L — ABNORMAL HIGH (ref 15–41)
Albumin: 2.6 g/dL — ABNORMAL LOW (ref 3.5–5.0)
Alkaline Phosphatase: 103 U/L (ref 38–126)
Anion gap: 12 (ref 5–15)
BUN: 16 mg/dL (ref 8–23)
CO2: 22 mmol/L (ref 22–32)
Calcium: 8.9 mg/dL (ref 8.9–10.3)
Chloride: 94 mmol/L — ABNORMAL LOW (ref 98–111)
Creatinine, Ser: 0.72 mg/dL (ref 0.61–1.24)
GFR, Estimated: >60 mL/min (ref >60)
Glucose, Bld: 116 mg/dL — ABNORMAL HIGH (ref 70–99)
Potassium: 3.6 mmol/L (ref 3.5–5.1)
Sodium: 128 mmol/L — ABNORMAL LOW (ref 135–145)
Total Bilirubin: 0.8 mg/dL (ref 0.3–1.2)
Total Protein: 5.9 g/dL — ABNORMAL LOW (ref 6.5–8.1)

## 2022-09-25 LAB — HEPATITIS PANEL, ACUTE
HCV Ab: NONREACTIVE
Hep A IgM: NONREACTIVE
Hep B C IgM: NONREACTIVE
Hepatitis B Surface Ag: NONREACTIVE

## 2022-09-25 LAB — BRAIN NATRIURETIC PEPTIDE: B Natriuretic Peptide: 108.6 pg/mL — ABNORMAL HIGH (ref 0.0–100.0)

## 2022-09-25 NOTE — Progress Notes (Signed)
Pharmacy Antibiotic Note  David Townsend is a 62 y.o. 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 .  Pharmacy has been consulted for vanc and cefepime dosing for pna on 09/22/22   09/25/2022 D7 total abx D4 cefepime D3 nitrofurantion Tmax 100.6, WBC WNL (4/22) SCr WNL   Plan: Cefepime 2 gm IV q8h Nitrofurantion 100 mg po BID Follow renal function ,cultures and clinical course ? Length of therapy  Height:  (175.3 cm) Weight: 66.7 kg (147 lb 0.8 oz) IBW/kg (Calculated) : 70.7  Temp (24hrs), Avg:99 F (37.2 C), Min:98.1 F (36.7 C), Max:100.6 F (38.1 C)  Recent Labs  Lab 09/19/22 1423 09/20/22 0631 09/21/22 0543 09/22/22 0907 09/22/22 2333 09/22/22 2335 09/23/22 0144 09/23/22 0255 09/24/22 0258 09/25/22 0322  WBC  --  13.9* 9.3 8.9 8.1  --   --   --  8.5  --   CREATININE  --  0.85 0.74 0.72 0.87  --   --  0.89 0.65 0.72  LATICACIDVEN 0.8  --   --   --  0.9 0.9 1.1  --   --   --      Estimated Creatinine Clearance: 91.5 mL/min (by C-G formula based on SCr of 0.72 mg/dL).    Allergies  Allergen Reactions   Penicillins Other (See Comments)    Whelps     Antimicrobials this admission: 4/17 cipro >> 4/18 4/18 CTX >> 4/19 4/20 cefepime >> 4/21 vanc >>4/21 NTF 4/21>> Dose adjustments this admission:  Microbiology results: 4/18 BCx: ngF 4/17 UCx (Chronic foley, pt refused change in ED): >100k Ecoli (pan-sens), 80k E.faecalis (amp sens) 4/20 MRSA PCR:not detected 4/23 BCx2:  4/23 Hep A B C NR   Thank you for allowing pharmacy to be a part of this patient's care.  Herby Abraham, Pharm.D Use secure chat for questions 09/25/2022 1:59 PM

## 2022-09-25 NOTE — Progress Notes (Signed)
PROGRESS NOTE    David Townsend  BJY:[id number] DOB: May 17, 1961 DOA: 09/19/2022 PCP: Rema Fendt, NP     Brief Narrative:  David Townsend is a 62 y.o. 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    Reports a few days ago he felt generalized weakness and fell in the bathroom. Had to crawl to get back into bed. Mostly has been in bed since. Today slipped out of bed and laid on floor for 3 hours unable to get up. States he has abdominal pain but thinks it is due to straining to try and get up off the floor.   In the emergency department, patient was found to have UTI and was started on empiric antibiotic.  His Foley catheter was not exchanged, patient declined.  Overnight 4/20, patient became hypoxic.  He required up to 10 L high flow oxygen.  CTA chest was completed which revealed multilobar pneumonia bilaterally.  He was transferred to stepdown unit.  New events last 24 hours / Subjective: Unhappy due to his fluid restriction.  He has no complaints of worsening shortness of breath.  Continues to have a cough.  Fever 100.6 overnight.  States that he normally drinks 4-6 bottles of beer as well as 2 double screwdrivers daily and has been doing so for many years.  Assessment & Plan:   Principal Problem:   Sepsis secondary to UTI Active Problems:   ETOH abuse   Hyperlipidemia   Prostate cancer metastatic to bone   Tobacco abuse   Essential hypertension   Rhabdomyolysis   Hyponatremia   Sepsis secondary to UTI, HCAP -UTI related with chronic indwelling Foley catheter -Sepsis present on admission with fever, tachycardia -COVID negative, MRSA PCR negative -Blood culture negative to date -Leukocytosis resolved -Urine culture showing E. Coli, enterococcus --> Macrobid -Rocephin --> cefepime to cover HCAP  -Repeat blood  cultures today due to fever  Acute hypoxemic respiratory failure -Overnight 4/20-21, he became hypoxic, saturating 80% on room air.  Required up to 10 L high flow oxygen.  Transfer to stepdown unit. -Secondary to HCAP -Remains on 7 L nasal cannula O2 today -Continue Solu-Medrol, Dulera. He has significant smoking history.  Hypotonic hyponatremia -Serum osmole 273 -Urine sodium 123 -Urine osmole 493  -Consistent with SIADH, consider potomania.  Placed on fluid restriction diet. -Continue salt tabs  -Monitor BMP, improving   Elevated LFTs -Hold Tylenol, Lipitor -Check hepatitis panel -Check right upper quadrant ultrasound -Check echocardiogram and BNP to see if any cardiac component  Rhabdomyolysis -Improved with IV fluid  Metastatic prostate cancer -Followed by Dr. Berneice Heinrich, [facility]   Hypertension -Amlodipine. Avapro - on hold due to hyponatremia   Hyperlipidemia -Lipitor -on hold due to elevation in LFTs  Alcohol abuse -CIWA protocol, B1, folic acid  Tobacco abuse -Nicotine patch   Fall with generalized weakness -PT OT, suspect patient may need placement in SNF for discharge   DVT prophylaxis:  enoxaparin (LOVENOX) injection 40 mg Start: 09/20/22 1000  Code Status: Full code Family Communication: None at bedside Disposition Plan: Home versus SNF Status is: Inpatient Remains inpatient appropriate because: IV antibiotics, O2, LFT elevation   Antimicrobials:  Anti-infectives (From admission, onward)    Start     Dose/Rate Route Frequency Ordered Stop   09/23/22 1200  vancomycin (VANCOCIN) IVPB 1000 mg/200 mL premix  Status:  Discontinued  1,000 mg 200 mL/hr over 60 Minutes Intravenous Every 12 hours 09/22/22 2336 09/23/22 0743   09/23/22 1000  nitrofurantoin (macrocrystal-monohydrate) (MACROBID) capsule 100 mg        100 mg Oral Every 12 hours 09/23/22 0743     09/23/22 0015  vancomycin (VANCOREADY) IVPB 1250 mg/250 mL        1,250  mg 166.7 mL/hr over 90 Minutes Intravenous  Once 09/22/22 2325 09/23/22 0326   09/22/22 2345  ceFEPIme (MAXIPIME) 2 g in sodium chloride 0.9 % 100 mL IVPB        2 g 200 mL/hr over 30 Minutes Intravenous Every 8 hours 09/22/22 2336     09/22/22 1200  ceFEPIme (MAXIPIME) 2 g in sodium chloride 0.9 % 100 mL IVPB  Status:  Discontinued        2 g 200 mL/hr over 30 Minutes Intravenous Every 12 hours 09/22/22 1121 09/22/22 2336   09/20/22 2000  cefTRIAXone (ROCEPHIN) 1 g in sodium chloride 0.9 % 100 mL IVPB  Status:  Discontinued        1 g 200 mL/hr over 30 Minutes Intravenous Every 24 hours 09/20/22 1038 09/22/22 1121   09/19/22 2200  ciprofloxacin (CIPRO) IVPB 200 mg  Status:  Discontinued        200 mg 100 mL/hr over 60 Minutes Intravenous Every 12 hours 09/19/22 2133 09/20/22 1038        Objective: Vitals:   09/25/22 0410 09/25/22 0700 09/25/22 0743 09/25/22 0800  BP:  (!) 161/70  (!) 153/62  Pulse:  86 91 100  Resp:  (!) 24 (!) 28 (!) 29  Temp: 100.3 F (37.9 C)   98.4 F (36.9 C)  TempSrc: Oral   Oral  SpO2:  92% 91% 90%  Weight:      Height:        Intake/Output Summary (Last 24 hours) at 09/25/2022 1005 Last data filed at 09/25/2022 0729 Gross per 24 hour  Intake 580 ml  Output 1375 ml  Net -795 ml    Filed Weights   09/19/22 1128 09/22/22 2245  Weight: 70.3 kg 66.7 kg    Examination:  General exam: Appears calm and comfortable, weak appearing Respiratory system: Without distress, clear to auscultation anteriorly Cardiovascular system: NSR.  Mild bilateral pedal edema. Gastrointestinal system: Abdomen is nondistended, soft and nontender Central nervous system: Alert and oriented.  Extremities: Symmetric in appearance  Skin: No rashes, lesions or ulcers on exposed skin  Psychiatry: Judgement and insight appear normal. Mood & affect appropriate.   Data Reviewed: I have personally reviewed following labs and imaging studies  CBC: Recent Labs  Lab  09/19/22 1150 09/20/22 0631 09/21/22 0543 09/22/22 0907 09/22/22 2333 09/24/22 0258  WBC 16.6* 13.9* 9.3 8.9 8.1 8.5  NEUTROABS 15.1*  --   --   --  7.5  --   HGB 12.8* 11.1* 11.3* 11.2* 11.1* 9.9*  HCT 35.7* 32.4* 32.5* 31.5* 30.1* 27.6*  MCV 112.3* 114.1* 114.8* 111.3* 106.7* 109.5*  PLT 193 158 153 153 134* 143*    Basic Metabolic Panel: Recent Labs  Lab 09/21/22 0543 09/22/22 0907 09/22/22 2333 09/22/22 2335 09/23/22 0255 09/24/22 0258 09/25/22 0322  NA 123* 124* 120*  --  121* 127* 128*  K 3.3* 3.0* 2.7*  --  2.9* 3.4* 3.6  CL 94* 93* 89*  --  94* 98 94*  CO2 15* 18* 15*  --  18* 18* 22  GLUCOSE 74 77 82  --  91 155*  116*  BUN 13 9 11   --  13 21 16   CREATININE 0.74 0.72 0.87  --  0.89 0.65 0.72  CALCIUM 8.3* 8.4* 7.8*  --  7.8* 8.5* 8.9  MG 1.7 1.5*  --  1.9 2.0 2.2  --     GFR: Estimated Creatinine Clearance: 91.5 mL/min (by C-G formula based on SCr of 0.72 mg/dL). Liver Function Tests: Recent Labs  Lab 09/19/22 1244 09/22/22 2333 09/25/22 0322  AST 148* 392* 916*  ALT 36 86* 274*  ALKPHOS 54 58 103  BILITOT 0.7 1.4* 0.8  PROT 8.3* 5.6* 5.9*  ALBUMIN 4.5 2.6* 2.6*    No results for input(s): "LIPASE", "AMYLASE" in the last 168 hours. No results for input(s): "AMMONIA" in the last 168 hours. Coagulation Profile: No results for input(s): "INR", "PROTIME" in the last 168 hours. Cardiac Enzymes: Recent Labs  Lab 09/19/22 1424 09/20/22 0631  CKTOTAL 6,277* 3,688*    BNP (last 3 results) No results for input(s): "PROBNP" in the last 8760 hours. HbA1C: No results for input(s): "HGBA1C" in the last 72 hours. CBG: No results for input(s): "GLUCAP" in the last 168 hours. Lipid Profile: No results for input(s): "CHOL", "HDL", "LDLCALC", "TRIG", "CHOLHDL", "LDLDIRECT" in the last 72 hours. Thyroid Function Tests: No results for input(s): "TSH", "T4TOTAL", "FREET4", "T3FREE", "THYROIDAB" in the last 72 hours. Anemia Panel: No results for input(s):  "VITAMINB12", "FOLATE", "FERRITIN", "TIBC", "IRON", "RETICCTPCT" in the last 72 hours. Sepsis Labs: Recent Labs  Lab 09/19/22 1423 09/22/22 2333 09/22/22 2335 09/23/22 0144  PROCALCITON  --  6.43  --   --   LATICACIDVEN 0.8 0.9 0.9 1.1     Recent Results (from the past 240 hour(s))  Culture, Urine (Do not remove urinary catheter, catheter placed by urology or difficult to place)     Status: Abnormal   Collection Time: 09/19/22 11:50 AM   Specimen: Urine, Catheterized  Result Value Ref Range Status   Specimen Description   Final    URINE, CATHETERIZED Performed at [facility], [address], [place], [place] [address]    Special Requests   Final    NONE Performed at [facility], [address] [place], [place] [address]    Culture (A)  Final    >=100,000 COLONIES/mL ESCHERICHIA COLI 80,000 COLONIES/mL ENTEROCOCCUS FAECALIS    Report Status 09/23/2022 FINAL  Final   Organism ID, Bacteria ENTEROCOCCUS FAECALIS (A)  Final   Organism ID, Bacteria ESCHERICHIA COLI (A)  Final      Susceptibility   Escherichia coli - MIC*    AMPICILLIN 8 SENSITIVE Sensitive     CEFAZOLIN <=4 SENSITIVE Sensitive     CEFTRIAXONE <=0.25 SENSITIVE Sensitive     CIPROFLOXACIN <=0.25 SENSITIVE Sensitive     GENTAMICIN <=1 SENSITIVE Sensitive     IMIPENEM <=0.25 SENSITIVE Sensitive     NITROFURANTOIN <=16 SENSITIVE Sensitive     TRIMETH/SULFA <=20 SENSITIVE Sensitive     AMPICILLIN/SULBACTAM 8 SENSITIVE Sensitive     * >=100,000 COLONIES/mL ESCHERICHIA COLI   Enterococcus faecalis - MIC*    AMPICILLIN <=2 SENSITIVE Sensitive     NITROFURANTOIN <=16 SENSITIVE Sensitive     VANCOMYCIN 2 SENSITIVE Sensitive     * 80,000 COLONIES/mL ENTEROCOCCUS FAECALIS  Culture, blood (Routine X 2) w Reflex to ID Panel     Status: None   Collection Time: 09/20/22 11:48 AM   Specimen: BLOOD  Result Value Ref Range Status   Specimen Description  Final    BLOOD BLOOD RIGHT  ARM AEROBIC BOTTLE ONLY Performed at [facility], [address], [place], [place] [address]    Special Requests   Final    BOTTLES DRAWN AEROBIC ONLY Blood Culture adequate volume Performed at [facility], [address] [place], [place] [address]    Culture   Final    NO GROWTH 5 DAYS Performed at [facility], [address], [place], [place] [address]    Report Status 09/25/2022 FINAL  Final  Culture, blood (Routine X 2) w Reflex to ID Panel     Status: None   Collection Time: 09/20/22 11:48 AM   Specimen: BLOOD  Result Value Ref Range Status   Specimen Description   Final    BLOOD BLOOD RIGHT HAND Performed at [facility], [address], [place], [place] [address]    Special Requests   Final    BOTTLES DRAWN AEROBIC ONLY Blood Culture adequate volume Performed at [facility], [address] [place], [place] [address]    Culture   Final    NO GROWTH 5 DAYS Performed at [facility], [address], [place], [place] [address]    Report Status 09/25/2022 FINAL  Final  MRSA Next Gen by PCR, Nasal     Status: None   Collection Time: 09/22/22 11:10 PM   Specimen: Nasal Mucosa; Nasal Swab  Result Value Ref Range Status   MRSA by PCR Next Gen NOT DETECTED NOT DETECTED Final    Comment: (NOTE) The GeneXpert MRSA Assay (FDA approved for NASAL specimens only), is one component of a comprehensive MRSA colonization surveillance program. It is not intended to diagnose MRSA infection nor to guide or monitor treatment for MRSA infections. Test performance is not FDA approved in patients less than 28 years old. Performed at [facility], [address], [place], [place] [address]   SARS Coronavirus 2 by RT PCR (hospital order, performed in [facility] hospital lab) *cepheid single result test* Anterior Nasal Swab     Status: None   Collection Time: 09/23/22  8:00 AM    Specimen: Anterior Nasal Swab  Result Value Ref Range Status   SARS Coronavirus 2 by RT PCR NEGATIVE NEGATIVE Final    Comment: (NOTE) SARS-CoV-2 target nucleic acids are NOT DETECTED.  The SARS-CoV-2 RNA is generally detectable in upper and lower respiratory specimens during the acute phase of infection. The lowest concentration of SARS-CoV-2 viral copies this assay can detect is 250 copies / mL. A negative result does not preclude SARS-CoV-2 infection and should not be used as the sole basis for treatment or other patient management decisions.  A negative result may occur with improper specimen collection / handling, submission of specimen other than nasopharyngeal swab, presence of viral mutation(s) within the areas targeted by this assay, and inadequate number of viral copies (<250 copies / mL). A negative result must be combined with clinical observations, patient history, and epidemiological information.  Fact Sheet for Patients:   RoadLapTop.co.za  Fact Sheet for Healthcare Providers: http://kim-miller.com/  This test is not yet approved or  cleared by the [place] FDA and has been authorized for detection and/or diagnosis of SARS-CoV-2 by FDA under an Emergency Use Authorization (EUA).  This EUA will remain in effect (meaning this test can be used) for the duration of the COVID-19 declaration under Section 564(b)(1) of the Act, 21 U.S.C. section 360bbb-3(b)(1), unless  the authorization is terminated or revoked sooner.  Performed at [facility], [address] [place], [place] [address]       Radiology Studies: No results found.    Scheduled Meds:  amLODipine  10 mg Oral Daily   Chlorhexidine Gluconate Cloth  6 each Topical Daily   enoxaparin (LOVENOX) injection  40 mg Subcutaneous Q24H   folic acid  1 mg Oral Daily   lidocaine  1 patch Transdermal Q24H   methylPREDNISolone (SOLU-MEDROL)  injection  80 mg Intravenous Daily   mometasone-formoterol  2 puff Inhalation BID   multivitamin with minerals  1 tablet Oral Daily   nicotine  21 mg Transdermal Daily   nitrofurantoin (macrocrystal-monohydrate)  100 mg Oral Q12H   sodium chloride  1 g Oral BID WC   thiamine  100 mg Oral Daily   Or   thiamine  100 mg Intravenous Daily   Continuous Infusions:  ceFEPime (MAXIPIME) IV Stopped (09/25/22 0751)     LOS: 5 days   Time spent: 35 minutes   Noralee Stain, DO [facility] 09/25/2022, 10:05 AM   Available via Epic secure chat 7am-7pm After these hours, please refer to coverage provider listed on [email or web address]

## 2022-09-25 NOTE — Progress Notes (Signed)
Occupational Therapy Treatment Patient Details Name: David Townsend MRN: [id number] DOB: 1960/06/08 Today's Date: 09/25/2022   History of present illness Pt is 62 yo male presented on 09/19/22 after a fall and generalized weakness.  Pt admitted with sepsis secondary to UTI and rhabdomyolysis. Left ribs 9-11 fractured.  Additionally, pt with ETOH abuse an on CIWA protocol. 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 to SDU 4/20   OT comments  Patient was noted to need less A compared to evaluation for transfer with continued decreased standing balance and safety awareness impacting participation in ADLs. Patient was educated on importance of getting up each day with family encouragement for patient as well. Patient's discharge plan remains appropriate at this time. OT will continue to follow acutely.     Recommendations for follow up therapy are one component of a multi-disciplinary discharge planning process, led by the attending physician.  Recommendations may be updated based on patient status, additional functional criteria and insurance authorization.    Assistance Recommended at Discharge Frequent or constant Supervision/Assistance  Patient can return home with the following  Assist for transportation;Help with stairs or ramp for entrance;Assistance with cooking/housework;A lot of help with walking and/or transfers;A lot of help with bathing/dressing/bathroom;Direct supervision/assist for medications management   Equipment Recommendations  BSC/3in1;Tub/shower seat       Precautions / Restrictions Precautions Precautions: Fall Precaution Comments: foley, On 7 L HFNC, Restrictions Weight Bearing Restrictions: No       Mobility Bed Mobility Overal bed mobility: Needs Assistance Bed Mobility: Supine to Sit     Supine to sit: Min assist     General bed  mobility comments: with increased time and cues on proper sequencing.        Balance Overall balance assessment: Needs assistance, History of Falls Sitting-balance support: Feet supported, Bilateral upper extremity supported, No upper extremity supported Sitting balance-Leahy Scale: Poor       Standing balance-Leahy Scale: Poor Standing balance comment: uanble to achieve standing declined to use RW         ADL either performed or assessed with clinical judgement   ADL Overall ADL's : Needs assistance/impaired           Toilet Transfer: Squat-pivot;Maximal assistance Toilet Transfer Details (indicate cue type and reason): to transfer into recliner with patient unable to come into upright standing. patient declining to use RW to transfer on this date. nurse present in room during session.           General ADL Comments: patient reportd being fatigued with transfer to the recliner. caregiver in room was encouragement for patient to participate in session and to do as much as he can for himself. patient was provided with stress ball to encourage patient to keep movements when not with therapy.      Cognition Arousal/Alertness: Awake/alert Behavior During Therapy: Flat affect Overall Cognitive Status: Difficult to assess         Following Commands: Follows one step commands consistently       General Comments: patient had family in room who were helpful in encouragement for patient to get out of bed. patient noted to be impulsive with movements to chair                   Pertinent Vitals/ Pain       Pain Assessment Pain Assessment: Faces Faces Pain Scale: Hurts a  little bit Pain Location: back with sitting EOB Pain Descriptors / Indicators: Discomfort Pain Intervention(s): Monitored during session         Frequency  Min 2X/week        Progress Toward Goals  OT Goals(current goals can now be found in the care plan section)  Progress towards OT  goals: Progressing toward goals     Plan Discharge plan remains appropriate       AM-PAC OT "6 Clicks" Daily Activity     Outcome Measure   Help from another person eating meals?: None Help from another person taking care of personal grooming?: None Help from another person toileting, which includes using toliet, bedpan, or urinal?: A Lot Help from another person bathing (including washing, rinsing, drying)?: A Lot Help from another person to put on and taking off regular upper body clothing?: A Little Help from another person to put on and taking off regular lower body clothing?: A Lot 6 Click Score: 17    End of Session    OT Visit Diagnosis: Unsteadiness on feet (R26.81);Muscle weakness (generalized) (M62.81);History of falling (Z91.81)   Activity Tolerance Patient limited by fatigue   Patient Left in chair;with call bell/phone within reach;with chair alarm set;with family/visitor present   Nurse Communication Other (comment) (nurse present in room)        Time: 1610-9604 OT Time Calculation (min): 17 min  Charges: OT General Charges $OT Visit: 1 Visit OT Treatments $Therapeutic Activity: 8-22 mins  Rosalio Loud, MS Acute Rehabilitation Department Office# [phone]   Selinda Flavin 09/25/2022, 2:55 PM

## 2022-09-26 ENCOUNTER — Inpatient Hospital Stay (HOSPITAL_COMMUNITY): Payer: 59

## 2022-09-26 DIAGNOSIS — R509 Fever, unspecified: Secondary | ICD-10-CM

## 2022-09-26 DIAGNOSIS — N390 Urinary tract infection, site not specified: Secondary | ICD-10-CM | POA: Diagnosis not present

## 2022-09-26 DIAGNOSIS — A419 Sepsis, unspecified organism: Secondary | ICD-10-CM | POA: Diagnosis not present

## 2022-09-26 LAB — ECHOCARDIOGRAM COMPLETE
Area-P 1/2: 4.74 cm2
Calc EF: 61.1 %
Height: 69 in
S' Lateral: 3.4 cm
Single Plane A2C EF: 54 %
Single Plane A4C EF: 64.7 %
Weight: 2352.75 oz

## 2022-09-26 LAB — COMPREHENSIVE METABOLIC PANEL
ALT: 223 U/L — ABNORMAL HIGH (ref 0–44)
AST: 520 U/L — ABNORMAL HIGH (ref 15–41)
Albumin: 2.4 g/dL — ABNORMAL LOW (ref 3.5–5.0)
Alkaline Phosphatase: 95 U/L (ref 38–126)
Anion gap: 9 (ref 5–15)
BUN: 16 mg/dL (ref 8–23)
CO2: 23 mmol/L (ref 22–32)
Calcium: 8.7 mg/dL — ABNORMAL LOW (ref 8.9–10.3)
Chloride: 96 mmol/L — ABNORMAL LOW (ref 98–111)
Creatinine, Ser: 0.56 mg/dL — ABNORMAL LOW (ref 0.61–1.24)
GFR, Estimated: >60 mL/min (ref >60)
Glucose, Bld: 127 mg/dL — ABNORMAL HIGH (ref 70–99)
Potassium: 3.1 mmol/L — ABNORMAL LOW (ref 3.5–5.1)
Sodium: 128 mmol/L — ABNORMAL LOW (ref 135–145)
Total Bilirubin: 0.7 mg/dL (ref 0.3–1.2)
Total Protein: 6 g/dL — ABNORMAL LOW (ref 6.5–8.1)

## 2022-09-26 LAB — CBC
HCT: 25.2 % — ABNORMAL LOW (ref 39.0–52.0)
Hemoglobin: 9 g/dL — ABNORMAL LOW (ref 13.0–17.0)
MCH: 39 pg — ABNORMAL HIGH (ref 26.0–34.0)
MCHC: 35.7 g/dL (ref 30.0–36.0)
MCV: 109.1 fL — ABNORMAL HIGH (ref 80.0–100.0)
Platelets: 147 10*3/uL — ABNORMAL LOW (ref 150–400)
RBC: 2.31 MIL/uL — ABNORMAL LOW (ref 4.22–5.81)
RDW: 11.9 % (ref 11.5–15.5)
WBC: 8.5 10*3/uL (ref 4.0–10.5)
nRBC: 0 % (ref 0.0–0.2)

## 2022-09-26 MED ORDER — POTASSIUM CHLORIDE CRYS ER 20 MEQ PO TBCR
40.0000 meq | EXTENDED_RELEASE_TABLET | Freq: Once | ORAL | Status: AC
  Administered 2022-09-26: 40 meq via ORAL
  Filled 2022-09-26: qty 2

## 2022-09-26 MED ORDER — PERFLUTREN LIPID MICROSPHERE
1.0000 mL | INTRAVENOUS | Status: AC | PRN
  Administered 2022-09-26: 2 mL via INTRAVENOUS

## 2022-09-26 NOTE — Progress Notes (Signed)
Occupational Therapy Treatment Patient Details Name: David Townsend MRN: [id number] DOB: 1961-02-13 Today's Date: 09/26/2022   History of present illness Pt is 62 yo male presented on 09/19/22 after a fall and generalized weakness.  Pt admitted with sepsis secondary to UTI and rhabdomyolysis. Left ribs 9-11 fractured.  Additionally, pt with ETOH abuse an on CIWA protocol. 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 to SDU 4/20   OT comments  Patient was noted to transfer to recliner better with more upright posture using RW. Patient remains +2 for transfers for safety with patient impulsive with movements. Patient was able to maintain O2 saturation on 6L HFNC during session. Patient declined to participate in most ADL tasks on this date. Patient's discharge plan remains appropriate at this time. OT will continue to follow acutely.     Recommendations for follow up therapy are one component of a multi-disciplinary discharge planning process, led by the attending physician.  Recommendations may be updated based on patient status, additional functional criteria and insurance authorization.    Assistance Recommended at Discharge Frequent or constant Supervision/Assistance  Patient can return home with the following  Assist for transportation;Help with stairs or ramp for entrance;Assistance with cooking/housework;A lot of help with walking and/or transfers;A lot of help with bathing/dressing/bathroom;Direct supervision/assist for medications management   Equipment Recommendations  BSC/3in1;Tub/shower seat       Precautions / Restrictions Precautions Precautions: Fall Precaution Comments: foley, On 7 L HFNC, Restrictions Weight Bearing Restrictions: No       Mobility Bed Mobility Overal bed mobility: Needs Assistance Bed Mobility: Supine to Sit     Supine to sit:  Min assist, HOB elevated     General bed mobility comments: multimodal cues to use rails, extra time to push to sitting upright.    Transfers Overall transfer level: Needs assistance Equipment used: Rolling walker (2 wheels) Transfers: Sit to/from Stand Sit to Stand: Mod assist, +2 safety/equipment, +2 physical assistance     Step pivot transfers: Mod assist, +2 safety/equipment, +2 physical assistance     General transfer comment: patient with flexed trunk but near midline, patient agreed to  step to recliner         ADL either performed or assessed with clinical judgement   ADL Overall ADL's : Needs assistance/impaired       Grooming Details (indicate cue type and reason): patient decliend to brush teeth stating he would do it later. Upper Body Bathing: Set up;Sitting Upper Body Bathing Details (indicate cue type and reason): with increased time. patient needed encouragement to participate in task, noted to be sweaty with nurse made aware.     Upper Body Dressing : Min guard;Sitting Upper Body Dressing Details (indicate cue type and reason): patient needed encouragement to participate in task, noted to be sweaty with nurse made aware.                          Cognition Arousal/Alertness: Awake/alert Behavior During Therapy: Flat affect Overall Cognitive Status: Impaired/Different from baseline         Current Attention Level: Alternating Memory: Decreased recall of precautions Following Commands: Follows one step commands with increased time Safety/Judgement: Decreased awareness of safety     General Comments: patient very particular on where things are. patient was able to follow commands and be redirected with increased time  Pertinent Vitals/ Pain       Pain Assessment Pain Assessment: No/denies pain         Frequency  Min 2X/week        Progress Toward Goals  OT Goals(current goals can now be found in the care  plan section)  Progress towards OT goals: Progressing toward goals     Plan Discharge plan remains appropriate    Co-evaluation      Reason for Co-Treatment: To address functional/ADL transfers PT goals addressed during session: Mobility/safety with mobility OT goals addressed during session: ADL's and self-care      AM-PAC OT "6 Clicks" Daily Activity     Outcome Measure   Help from another person eating meals?: None Help from another person taking care of personal grooming?: None Help from another person toileting, which includes using toliet, bedpan, or urinal?: A Lot Help from another person bathing (including washing, rinsing, drying)?: A Lot Help from another person to put on and taking off regular upper body clothing?: A Little Help from another person to put on and taking off regular lower body clothing?: A Lot 6 Click Score: 17    End of Session Equipment Utilized During Treatment: Gait belt;Rolling walker (2 wheels)  OT Visit Diagnosis: Unsteadiness on feet (R26.81);Muscle weakness (generalized) (M62.81);History of falling (Z91.81)   Activity Tolerance Patient limited by fatigue   Patient Left in chair;with call bell/phone within reach;with chair alarm set;with family/visitor present   Nurse Communication Other (comment) (nurse made aware of IV out of place on R elbow upon entrance to room)        Time: 1610-9604 OT Time Calculation (min): 16 min  Charges: OT General Charges $OT Visit: 1 Visit  Rosalio Loud, MS Acute Rehabilitation Department Office# [phone]   Selinda Flavin 09/26/2022, 1:18 PM

## 2022-09-26 NOTE — Progress Notes (Signed)
  Echocardiogram 2D Echocardiogram has been performed.  Janalyn Harder 09/26/2022, 8:43 AM

## 2022-09-26 NOTE — Progress Notes (Signed)
PROGRESS NOTE  David Townsend ZOX:[id number] DOB: [birth year] DOA: 09/19/2022 PCP: Rema Fendt, NP   LOS: 6 days   Brief Narrative / Interim history: 62 y.o. 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 Reports a few days ago he felt generalized weakness and fell in the bathroom. Had to crawl to get back into bed. Mostly has been in bed since. Today slipped out of bed and laid on floor for 3 hours unable to get up. States he has abdominal pain but thinks it is due to straining to try and get up off the floor. In the emergency department, patient was found to have UTI and was started on empiric antibiotic.  His Foley catheter was not exchanged, patient declined. Overnight 4/20, patient became hypoxic.  He required up to 10 L high flow oxygen.  CTA chest was completed which revealed multilobar pneumonia bilaterally.  He was transferred to stepdown unit.  Subjective / 24h Interval events: Overall he is doing well.  He denies any shortness of breath.  He reports significant weakness  Assesement and Plan: Principal Problem:   Sepsis secondary to UTI Active Problems:   ETOH abuse   Hyperlipidemia   Prostate cancer metastatic to bone   Tobacco abuse   Essential hypertension   Rhabdomyolysis   Hyponatremia   Principal problem Sepsis secondary to UTI, HCAP -UTI related with chronic indwelling Foley catheter, patient was febrile, tachycardic on admission and met sepsis criteria.  He was COVID-negative, cultures are negative to date.  Urine culture showed E. coli and Enterococcus which are pansensitive.  He is currently on cefepime for HCAP, continue. -Last feveron 4/22 - 4/23 overnight, cultures were repeated at that time, without growth so far.  Afebrile last night  Active problems  Acute hypoxemic respiratory failure - Overnight  4/20-21, he became hypoxic, saturating 80% on room air.  Required up to 10 L high flow oxygen.  Transferred to stepdown unit.  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  Improving  Elevated LFTs -Hold Tylenol, Lipitor.  Hepatitis panel negative.  Right upper quadrant ultrasound with concern for cirrhosis.  2D echo unremarkable -LFTs improving, possibly in the setting of sepsis   Rhabdomyolysis -Improved with IV fluid   Metastatic prostate cancer -Followed by Dr. Berneice Heinrich, [facility]   Hypertension -Amlodipine. Avapro - on hold due to hyponatremia    Hyperlipidemia -Lipitor -on hold due to elevation in LFTs   Alcohol abuse -CIWA protocol, B1, folic acid   Tobacco abuse -Nicotine patch    Fall with generalized weakness -PT OT, suspect patient may need placement in SNF for discharge  Scheduled Meds:  amLODipine  10 mg Oral Daily   Chlorhexidine Gluconate Cloth  6 each Topical Daily   enoxaparin (LOVENOX) injection  40 mg Subcutaneous Q24H   folic acid  1 mg Oral Daily   lidocaine  1 patch Transdermal Q24H   methylPREDNISolone (SOLU-MEDROL) injection  80 mg Intravenous Daily   mometasone-formoterol  2 puff Inhalation BID   multivitamin with minerals  1 tablet Oral Daily   nicotine  21 mg Transdermal Daily   nitrofurantoin (macrocrystal-monohydrate)  100 mg Oral Q12H   sodium chloride  1 g Oral BID WC   thiamine  100 mg Oral Daily   Or   thiamine  100 mg Intravenous Daily   Continuous Infusions:  ceFEPime (MAXIPIME) IV Stopped (09/26/22 0911)   PRN Meds:.alum & mag hydroxide-simeth, HYDROcodone bit-homatropine, ibuprofen, ipratropium-albuterol, lip balm, menthol-cetylpyridinium, mouth rinse, traMADol  Current Outpatient Medications  Medication Instructions   acetaminophen (TYLENOL) 1,000 mg, Oral, Every 6 hours PRN   amLODipine (NORVASC) 10 mg, Oral, Daily   Ascorbic Acid  (VITAMIN C PO) 1 tablet, Oral, Daily   atorvastatin (LIPITOR) 40 mg, Oral, Daily   Erleada 120 mg, Oral, Daily   olmesartan (BENICAR) 40 MG tablet TAKE 1 TABLET(40 MG) BY MOUTH DAILY   spironolactone (ALDACTONE) 12.5 mg, Oral, Daily   VITAMIN D PO 1 capsule, Oral, Daily    Diet Orders (From admission, onward)     Start     Ordered   09/23/22 0742  Diet regular Room service appropriate? Yes; Fluid consistency: Thin; Fluid restriction: 1200 mL Fluid  Diet effective now       Question Answer Comment  Room service appropriate? Yes   Fluid consistency: Thin   Fluid restriction: 1200 mL Fluid      09/23/22 0741            DVT prophylaxis: enoxaparin (LOVENOX) injection 40 mg Start: 09/20/22 1000   Lab Results  Component Value Date   PLT 147 (L) 09/26/2022      Code Status: Full Code  Family Communication: no family at bedside   Status is: Inpatient  Remains inpatient appropriate because: severity of illness  Level of care: Stepdown  Consultants:  none  Objective: Vitals:   09/26/22 0800 09/26/22 0900 09/26/22 1000 09/26/22 1003  BP: (!) 162/71 (!) 165/72 (!) 151/67   Pulse: 90 97 88 89  Resp: (!) 24 (!) 36 17 20  Temp:      TempSrc:      SpO2: 94% 95% 93% 94%  Weight:      Height:        Intake/Output Summary (Last 24 hours) at 09/26/2022 1038 Last data filed at 09/26/2022 0500 Gross per 24 hour  Intake 662.67 ml  Output 425 ml  Net 237.67 ml   Wt Readings from Last 3 Encounters:  09/22/22 66.7 kg  08/22/22 72.6 kg  07/24/22 74.4 kg    Examination:  Constitutional: NAD Eyes: no scleral icterus ENMT: Mucous membranes are moist.  Neck: normal, supple Respiratory: clear to auscultation bilaterally, no wheezing, no crackles. Normal respiratory effort. No accessory muscle use.  Cardiovascular: Regular rate and rhythm, no murmurs / rubs / gallops. No LE edema.  Abdomen: non distended, no tenderness. Bowel sounds positive.  Musculoskeletal: no  clubbing / cyanosis.   Data Reviewed: I have independently reviewed following labs and imaging studies   CBC Recent Labs  Lab 09/19/22 1150 09/20/22 0631 09/21/22 0543 09/22/22 0907 09/22/22 2333 09/24/22 0258 09/26/22 0258  WBC 16.6*   < > 9.3 8.9 8.1 8.5 8.5  HGB 12.8*   < > 11.3* 11.2* 11.1* 9.9* 9.0*  HCT 35.7*   < > 32.5* 31.5* 30.1* 27.6* 25.2*  PLT 193   < > 153 153 134* 143* 147*  MCV 112.3*   < > 114.8* 111.3* 106.7* 109.5* 109.1*  MCH 40.3*   < > 39.9* 39.6* 39.4* 39.3* 39.0*  MCHC 35.9   < > 34.8 35.6 36.9* 35.9 35.7  RDW 12.2   < > 12.0 11.9 11.8 11.9 11.9  LYMPHSABS 0.7  --   --   --  0.3*  --   --   MONOABS  0.6  --   --   --  0.3  --   --   EOSABS 0.0  --   --   --  0.0  --   --   BASOSABS 0.1  --   --   --  0.0  --   --    < > = values in this interval not displayed.    Recent Labs  Lab 09/19/22 1244 09/19/22 1423 09/20/22 0631 09/21/22 0543 09/22/22 1610 09/22/22 2333 09/22/22 2335 09/23/22 0144 09/23/22 0255 09/24/22 0258 09/25/22 0322 09/25/22 0802 09/26/22 0258  NA  --   --    < > 123* 124* 120*  --   --  121* 127* 128*  --  128*  K  --   --    < > 3.3* 3.0* 2.7*  --   --  2.9* 3.4* 3.6  --  3.1*  CL  --   --    < > 94* 93* 89*  --   --  94* 98 94*  --  96*  CO2  --   --    < > 15* 18* 15*  --   --  18* 18* 22  --  23  GLUCOSE  --   --    < > 74 77 82  --   --  91 155* 116*  --  127*  BUN  --   --    < > 13 9 11   --   --  13 21 16   --  16  CREATININE  --   --    < > 0.74 0.72 0.87  --   --  0.89 0.65 0.72  --  0.56*  CALCIUM  --   --    < > 8.3* 8.4* 7.8*  --   --  7.8* 8.5* 8.9  --  8.7*  AST 148*  --   --   --   --  392*  --   --   --   --  916*  --  520*  ALT 36  --   --   --   --  86*  --   --   --   --  274*  --  223*  ALKPHOS 54  --   --   --   --  58  --   --   --   --  103  --  95  BILITOT 0.7  --   --   --   --  1.4*  --   --   --   --  0.8  --  0.7  ALBUMIN 4.5  --   --   --   --  2.6*  --   --   --   --  2.6*  --  2.4*  MG  --    --   --  1.7 1.5*  --  1.9  --  2.0 2.2  --   --   --   DDIMER  --   --   --   --   --  3.65*  --   --   --   --   --   --   --   PROCALCITON  --   --   --   --   --  6.43  --   --   --   --   --   --   --   LATICACIDVEN  --  0.8  --   --   --  0.9 0.9 1.1  --   --   --   --   --   BNP  --   --   --   --   --   --  275.5*  --   --   --   --  108.6*  --    < > = values in this interval not displayed.    ------------------------------------------------------------------------------------------------------------------ No results for input(s): "CHOL", "HDL", "LDLCALC", "TRIG", "CHOLHDL", "LDLDIRECT" in the last 72 hours.  Lab Results  Component Value Date   HGBA1C 5.2 11/30/2021   ------------------------------------------------------------------------------------------------------------------ No results for input(s): "TSH", "T4TOTAL", "T3FREE", "THYROIDAB" in the last 72 hours.  Invalid input(s): "FREET3"  Cardiac Enzymes No results for input(s): "CKMB", "TROPONINI", "MYOGLOBIN" in the last 168 hours.  Invalid input(s): "CK" ------------------------------------------------------------------------------------------------------------------    Component Value Date/Time   BNP 108.6 (H) 09/25/2022 0802    CBG: No results for input(s): "GLUCAP" in the last 168 hours.  Recent Results (from the past 240 hour(s))  Culture, Urine (Do not remove urinary catheter, catheter placed by urology or difficult to place)     Status: Abnormal   Collection Time: 09/19/22 11:50 AM   Specimen: Urine, Catheterized  Result Value Ref Range Status   Specimen Description   Final    URINE, CATHETERIZED Performed at [facility], [address], [place], [place] [address]    Special Requests   Final    NONE Performed at [facility], [address], [place], [place] [address]    Culture (A)  Final    >=100,000 COLONIES/mL ESCHERICHIA COLI 80,000 COLONIES/mL  ENTEROCOCCUS FAECALIS    Report Status 09/23/2022 FINAL  Final   Organism ID, Bacteria ENTEROCOCCUS FAECALIS (A)  Final   Organism ID, Bacteria ESCHERICHIA COLI (A)  Final      Susceptibility   Escherichia coli - MIC*    AMPICILLIN 8 SENSITIVE Sensitive     CEFAZOLIN <=4 SENSITIVE Sensitive     CEFTRIAXONE <=0.25 SENSITIVE Sensitive     CIPROFLOXACIN <=0.25 SENSITIVE Sensitive     GENTAMICIN <=1 SENSITIVE Sensitive     IMIPENEM <=0.25 SENSITIVE Sensitive     NITROFURANTOIN <=16 SENSITIVE Sensitive     TRIMETH/SULFA <=20 SENSITIVE Sensitive     AMPICILLIN/SULBACTAM 8 SENSITIVE Sensitive     * >=100,000 COLONIES/mL ESCHERICHIA COLI   Enterococcus faecalis - MIC*    AMPICILLIN <=2 SENSITIVE Sensitive     NITROFURANTOIN <=16 SENSITIVE Sensitive     VANCOMYCIN 2 SENSITIVE Sensitive     * 80,000 COLONIES/mL ENTEROCOCCUS FAECALIS  Culture, blood (Routine X 2) w Reflex to ID Panel     Status: None   Collection Time: 09/20/22 11:48 AM   Specimen: BLOOD  Result Value Ref Range Status   Specimen Description   Final    BLOOD BLOOD RIGHT ARM AEROBIC BOTTLE ONLY Performed at [facility], [address], [place], [place] [address]    Special Requests   Final    BOTTLES DRAWN AEROBIC ONLY Blood Culture adequate volume Performed at [facility], [address] [place], [place] [address]    Culture   Final    NO GROWTH 5 DAYS Performed at [facility], [address], [place], [place] [address]    Report Status 09/25/2022 FINAL  Final  Culture, blood (Routine X 2) w Reflex to ID Panel     Status: None  Collection Time: 09/20/22 11:48 AM   Specimen: BLOOD  Result Value Ref Range Status   Specimen Description   Final    BLOOD BLOOD RIGHT HAND Performed at [facility], [address] [place], [place] [address]    Special Requests   Final    BOTTLES DRAWN AEROBIC ONLY Blood Culture adequate volume Performed at [facility], [address], [place], [place] [address]    Culture   Final    NO GROWTH 5 DAYS Performed at [facility], [address] [place], [place] [address]    Report Status 09/25/2022 FINAL  Final  MRSA Next Gen by PCR, Nasal     Status: None   Collection Time: 09/22/22 11:10 PM   Specimen: Nasal Mucosa; Nasal Swab  Result Value Ref Range Status   MRSA by PCR Next Gen NOT DETECTED NOT DETECTED Final    Comment: (NOTE) The GeneXpert MRSA Assay (FDA approved for NASAL specimens only), is one component of a comprehensive MRSA colonization surveillance program. It is not intended to diagnose MRSA infection nor to guide or monitor treatment for MRSA infections. Test performance is not FDA approved in patients less than 79 years old. Performed at [facility], [address], [place], [place] [address]   SARS Coronavirus 2 by RT PCR (hospital order, performed in [facility] hospital lab) *cepheid single result test* Anterior Nasal Swab     Status: None   Collection Time: 09/23/22  8:00 AM   Specimen: Anterior Nasal Swab  Result Value Ref Range Status   SARS Coronavirus 2 by RT PCR NEGATIVE NEGATIVE Final    Comment: (NOTE) SARS-CoV-2 target nucleic acids are NOT DETECTED.  The SARS-CoV-2 RNA is generally detectable in upper and lower respiratory specimens during the acute phase of infection. The lowest concentration of SARS-CoV-2 viral copies this assay can detect is 250 copies / mL. A negative result does not preclude SARS-CoV-2 infection and should not be used as the sole basis for treatment or other patient management decisions.  A negative result may occur with improper specimen collection / handling, submission of specimen other than nasopharyngeal swab, presence of viral mutation(s) within the areas targeted by this assay, and inadequate number of viral copies (<250 copies / mL). A negative result must be combined with  clinical observations, patient history, and epidemiological information.  Fact Sheet for Patients:   RoadLapTop.co.za  Fact Sheet for Healthcare Providers: http://kim-miller.com/  This test is not yet approved or  cleared by the [place] FDA and has been authorized for detection and/or diagnosis of SARS-CoV-2 by FDA under an Emergency Use Authorization (EUA).  This EUA will remain in effect (meaning this test can be used) for the duration of the COVID-19 declaration under Section 564(b)(1) of the Act, 21 U.S.C. section 360bbb-3(b)(1), unless the authorization is terminated or revoked sooner.  Performed at [facility], [address], [place], [place] [address]   Culture, blood (Routine X 2) w Reflex to ID Panel     Status: None (Preliminary result)   Collection Time: 09/25/22 11:27 AM   Specimen: BLOOD  Result Value Ref Range Status   Specimen Description   Final    BLOOD BLOOD RIGHT ARM AEROBIC BOTTLE ONLY Performed at [facility], [address], [place], [place] [address]    Special Requests   Final    BOTTLES DRAWN AEROBIC ONLY Blood Culture adequate volume Performed at [facility], [address], [place], [place] [address]    Culture   Final    NO GROWTH < 24 HOURS Performed at [facility], [address], [place], [place] [address]    Report Status PENDING  Incomplete  Culture, blood (Routine X 2) w Reflex to ID Panel     Status: None (Preliminary result)   Collection Time: 09/25/22 11:27 AM   Specimen: BLOOD  Result Value Ref Range Status   Specimen Description   Final    BLOOD BLOOD RIGHT HAND AEROBIC BOTTLE ONLY Performed at [facility], [address] [place], [place] [address]    Special Requests   Final    BOTTLES DRAWN AEROBIC ONLY Blood Culture adequate volume Performed at [facility], [address], [place], [place] [address]    Culture   Final    NO GROWTH < 24 HOURS Performed at [facility], [address] [place], [place] [address]    Report Status PENDING  Incomplete     Radiology Studies: ECHOCARDIOGRAM COMPLETE  Result Date: 09/26/2022    ECHOCARDIOGRAM REPORT   Patient Name:   David Townsend Date of Exam: 09/26/2022 Medical Rec #:  [id number]     Height:       69.0 in Accession #:    [id number]    Weight:       147.0 lb Date of Birth:  July 23, 1960    BSA:          1.813 m Patient Age:    61 years      BP:           160/74 mmHg Patient Gender: M             HR:           96 bpm. Exam Location:  Inpatient Procedure: 2D Echo, Cardiac Doppler, Color Doppler and Intracardiac            Opacification Agent Indications:    Elevated LFT's. Fever  History:        Patient has no prior history of Echocardiogram examinations.                 Risk Factors:Hypertension, Current Smoker and Dyslipidemia.                 ETOH. Metastatic cancer. Rhabdomyolysis.  Sonographer:    Sheralyn Boatman RDCS Referring Phys: [id number] [facility]  Sonographer Comments: Technically difficult study due to poor echo windows. Extremely difficult images. Patient has broken ribs. IMPRESSIONS  1. Left ventricular ejection fraction, by estimation, is 65 to 70%. The left ventricle has normal function. The left ventricle has no regional wall motion abnormalities. Left ventricular diastolic parameters are indeterminate.  2. Right ventricular systolic function was not well visualized. The right ventricular size is normal. There is normal pulmonary artery systolic pressure.  3. The mitral valve is normal in structure. No evidence of mitral valve regurgitation. No evidence of mitral stenosis.  4. The aortic valve is tricuspid. Aortic valve regurgitation is not visualized. Aortic valve sclerosis/calcification is present, without any evidence of aortic stenosis.  5. The inferior vena cava is normal in size with greater than 50%  respiratory variability, suggesting right atrial pressure of 3 mmHg. Conclusion(s)/Recommendation(s): No evidence of valvular vegetations on this transthoracic echocardiogram. Consider a transesophageal echocardiogram to exclude infective endocarditis if clinically indicated. FINDINGS  Left Ventricle: Left ventricular ejection fraction, by estimation, is 65 to  70%. The left ventricle has normal function. The left ventricle has no regional wall motion abnormalities. Definity contrast agent was given IV to delineate the left ventricular  endocardial borders. The left ventricular internal cavity size was normal in size. There is no left ventricular hypertrophy. Left ventricular diastolic parameters are indeterminate. Normal left ventricular filling pressure. Right Ventricle: The right ventricular size is normal. No increase in right ventricular wall thickness. Right ventricular systolic function was not well visualized. There is normal pulmonary artery systolic pressure. The tricuspid regurgitant velocity is  2.39 m/s, and with an assumed right atrial pressure of 3 mmHg, the estimated right ventricular systolic pressure is 25.8 mmHg. Left Atrium: Left atrial size was normal in size. Right Atrium: Right atrial size was normal in size. Pericardium: There is no evidence of pericardial effusion. Mitral Valve: The mitral valve is normal in structure. No evidence of mitral valve regurgitation. No evidence of mitral valve stenosis. Tricuspid Valve: The tricuspid valve is normal in structure. Tricuspid valve regurgitation is mild . No evidence of tricuspid stenosis. Aortic Valve: The aortic valve is tricuspid. Aortic valve regurgitation is not visualized. Aortic valve sclerosis/calcification is present, without any evidence of aortic stenosis. Pulmonic Valve: The pulmonic valve was normal in structure. Pulmonic valve regurgitation is not visualized. No evidence of pulmonic stenosis. Aorta: The aortic root is normal in size and  structure. Venous: The inferior vena cava is normal in size with greater than 50% respiratory variability, suggesting right atrial pressure of 3 mmHg. IAS/Shunts: No atrial level shunt detected by color flow Doppler.  LEFT VENTRICLE PLAX 2D LVIDd:         4.80 cm     Diastology LVIDs:         3.40 cm     LV e' medial:    6.96 cm/s LV PW:         0.90 cm     LV E/e' medial:  14.9 LV IVS:        1.00 cm     LV e' lateral:   14.70 cm/s LVOT diam:     2.40 cm     LV E/e' lateral: 7.0 LV SV:         73 LV SV Index:   40 LVOT Area:     4.52 cm  LV Volumes (MOD) LV vol d, MOD A2C: 47.2 ml LV vol d, MOD A4C: 47.6 ml LV vol s, MOD A2C: 21.7 ml LV vol s, MOD A4C: 16.8 ml LV SV MOD A2C:     25.5 ml LV SV MOD A4C:     47.6 ml LV SV MOD BP:      29.9 ml RIGHT VENTRICLE             IVC RV S prime:     10.30 cm/s  IVC diam: 1.90 cm TAPSE (M-mode): 2.0 cm LEFT ATRIUM             Index        RIGHT ATRIUM           Index LA diam:        3.50 cm 1.93 cm/m   RA Area:     10.50 cm LA Vol (A2C):   18.8 ml 10.37 ml/m  RA Volume:   20.50 ml  11.31 ml/m LA Vol (A4C):   18.2 ml 10.04 ml/m LA Biplane Vol: 19.7 ml 10.87 ml/m  AORTIC VALVE LVOT Vmax:   98.50 cm/s LVOT Vmean:  63.250 cm/s LVOT  VTI:    0.161 m  AORTA Ao Root diam: 3.70 cm Ao Asc diam:  3.40 cm MITRAL VALVE                TRICUSPID VALVE MV Area (PHT): 4.74 cm     TR Peak grad:   22.8 mmHg MV Decel Time: 160 msec     TR Vmax:        239.00 cm/s MV E velocity: 103.50 cm/s MV A velocity: 100.20 cm/s  SHUNTS MV E/A ratio:  1.03         Systemic VTI:  0.16 m                             Systemic Diam: 2.40 cm Armanda Magic MD Electronically signed by Armanda Magic MD Signature Date/Time: 09/26/2022/10:12:32 AM    Final    US Abdomen Limited RUQ (LIVER/GB)  Result Date: 09/25/2022 CLINICAL DATA:  Elevated liver function tests EXAM: ULTRASOUND ABDOMEN LIMITED RIGHT UPPER QUADRANT COMPARISON:  November 10, 2015 FINDINGS: Gallbladder: Gallstones: None Sludge: Minimal Gallbladder Wall:  Within normal limits Pericholecystic fluid: None Sonographic Murphy's Sign: Negative per technologist Common bile duct: Diameter: 2 mm Liver: Parenchymal echogenicity: Within normal limits Contours: Nodular Lesions: None Portal vein: Patent.  Hepatopetal flow Other: None. IMPRESSION: 1. Subtle nodularity of hepatic contours, suspicious for cirrhosis. 2. Minimal gallbladder sludge. Electronically Signed   By: Acquanetta Belling M.D.   On: 09/25/2022 16:12     Pamella Pert, MD, PhD [facility]  Between 7 am - 7 pm I am available, please contact me via Amion (for emergencies) or Securechat (non urgent messages)  Between 7 pm - 7 am I am not available, please contact night coverage MD/APP via Amion

## 2022-09-26 NOTE — Progress Notes (Signed)
Physical Therapy Treatment Patient Details Name: David Townsend MRN: [id number] DOB: [birth year] Today's Date: 09/26/2022   History of Present Illness Pt is 62 yo male presented on 09/19/22 after a fall and generalized weakness.  Pt admitted with sepsis secondary to UTI and rhabdomyolysis. Left ribs 9-11 fractured.  Additionally, pt with ETOH abuse an on CIWA protocol. 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 to SDU 4/20    PT Comments     The patient demonstrates improved posture in sitting and standing at Rw, less left lean. Patient declined to progress ambulation, did consent to  step to recliner.  Continue PT.   Recommendations for follow up therapy are one component of a multi-disciplinary discharge planning process, led by the attending physician.  Recommendations may be updated based on patient status, additional functional criteria and insurance authorization.  Follow Up Recommendations  Can patient physically be transported by private vehicle: No    Assistance Recommended at Discharge Frequent or constant Supervision/Assistance  Patient can return home with the following Two people to help with walking and/or transfers;A lot of help with bathing/dressing/bathroom;Assistance with cooking/housework;Direct supervision/assist for financial management;Assist for transportation;Help with stairs or ramp for entrance   Equipment Recommendations  Rolling walker (2 wheels)    Recommendations for Other Services       Precautions / Restrictions Precautions Precautions: Fall Precaution Comments: foley, On 7 L HFNC, Restrictions Weight Bearing Restrictions: No     Mobility  Bed Mobility   Bed Mobility: Supine to Sit     Supine to sit: Min assist, HOB elevated     General bed mobility comments: multimodal cues to use rails, extra time to push to sitting  upright.    Transfers Overall transfer level: Needs assistance Equipment used: Rolling walker (2 wheels) Transfers: Sit to/from Stand Sit to Stand: Mod assist, +2 safety/equipment, +2 physical assistance   Step pivot transfers: Mod assist, +2 safety/equipment, +2 physical assistance       General transfer comment: patient with flexed trunk but near midline, patient agreed to  step to recliner    Ambulation/Gait               General Gait Details: pt. declined to amb.   Stairs             Wheelchair Mobility    Modified Rankin (Stroke Patients Only)       Balance Overall balance assessment: Needs assistance, History of Falls Sitting-balance support: Feet supported, Bilateral upper extremity supported, No upper extremity supported Sitting balance-Leahy Scale: Fair     Standing balance support: Reliant on assistive device for balance, Bilateral upper extremity supported Standing balance-Leahy Scale: Poor Standing balance comment: standing is improved with more neutrl , less lean to left but forward flexed                            Cognition Arousal/Alertness: Awake/alert Behavior During Therapy: Flat affect Overall Cognitive Status: Impaired/Different from baseline                     Current Attention Level: Alternating Memory: Decreased recall of precautions Following Commands: Follows one step commands with increased time   Awareness: Emergent Problem Solving: Requires verbal cues, Requires tactile cues General Comments: at times patient stops participating in task and requires stimulation to stay on task.  Exercises      General Comments        Pertinent Vitals/Pain Pain Assessment Pain Assessment: No/denies pain    Home Living                          Prior Function            PT Goals (current goals can now be found in the care plan section) Progress towards PT goals: Progressing toward  goals    Frequency    Min 1X/week      PT Plan Current plan remains appropriate    Co-evaluation PT/OT/SLP Co-Evaluation/Treatment: Yes Reason for Co-Treatment: To address functional/ADL transfers PT goals addressed during session: Mobility/safety with mobility OT goals addressed during session: ADL's and self-care      AM-PAC PT "6 Clicks" Mobility   Outcome Measure  Help needed turning from your back to your side while in a flat bed without using bedrails?: A Little Help needed moving from lying on your back to sitting on the side of a flat bed without using bedrails?: A Little Help needed moving to and from a bed to a chair (including a wheelchair)?: A Lot Help needed standing up from a chair using your arms (e.g., wheelchair or bedside chair)?: A Lot Help needed to walk in hospital room?: Total Help needed climbing 3-5 steps with a railing? : Total 6 Click Score: 12    End of Session Equipment Utilized During Treatment: Gait belt;Oxygen Activity Tolerance: Patient limited by fatigue Patient left: in chair;with call bell/phone within reach;with chair alarm set Nurse Communication: Mobility status PT Visit Diagnosis: Other abnormalities of gait and mobility (R26.89);Muscle weakness (generalized) (M62.81);History of falling (Z91.81)     Time: 1135-1150 PT Time Calculation (min) (ACUTE ONLY): 15 min  Charges:  $Therapeutic Activity: 8-22 mins                     Blanchard Kelch PT Acute Rehabilitation Services Office [phone] Weekend pager-[phone]    Rada Hay 09/26/2022, 1:16 PM

## 2022-09-27 ENCOUNTER — Ambulatory Visit: Payer: 59

## 2022-09-27 ENCOUNTER — Inpatient Hospital Stay (HOSPITAL_COMMUNITY): Payer: 59

## 2022-09-27 DIAGNOSIS — F17210 Nicotine dependence, cigarettes, uncomplicated: Secondary | ICD-10-CM

## 2022-09-27 DIAGNOSIS — J80 Acute respiratory distress syndrome: Secondary | ICD-10-CM | POA: Diagnosis not present

## 2022-09-27 DIAGNOSIS — R6520 Severe sepsis without septic shock: Secondary | ICD-10-CM

## 2022-09-27 DIAGNOSIS — J181 Lobar pneumonia, unspecified organism: Secondary | ICD-10-CM | POA: Diagnosis not present

## 2022-09-27 DIAGNOSIS — C61 Malignant neoplasm of prostate: Secondary | ICD-10-CM

## 2022-09-27 DIAGNOSIS — N390 Urinary tract infection, site not specified: Secondary | ICD-10-CM | POA: Diagnosis not present

## 2022-09-27 DIAGNOSIS — C7951 Secondary malignant neoplasm of bone: Secondary | ICD-10-CM

## 2022-09-27 DIAGNOSIS — A419 Sepsis, unspecified organism: Secondary | ICD-10-CM | POA: Diagnosis not present

## 2022-09-27 LAB — COMPREHENSIVE METABOLIC PANEL
ALT: 198 U/L — ABNORMAL HIGH (ref 0–44)
AST: 321 U/L — ABNORMAL HIGH (ref 15–41)
Albumin: 2.4 g/dL — ABNORMAL LOW (ref 3.5–5.0)
Alkaline Phosphatase: 94 U/L (ref 38–126)
Anion gap: 10 (ref 5–15)
BUN: 19 mg/dL (ref 8–23)
CO2: 22 mmol/L (ref 22–32)
Calcium: 8.6 mg/dL — ABNORMAL LOW (ref 8.9–10.3)
Chloride: 97 mmol/L — ABNORMAL LOW (ref 98–111)
Creatinine, Ser: 0.64 mg/dL (ref 0.61–1.24)
GFR, Estimated: >60 mL/min (ref >60)
Glucose, Bld: 110 mg/dL — ABNORMAL HIGH (ref 70–99)
Potassium: 3.2 mmol/L — ABNORMAL LOW (ref 3.5–5.1)
Sodium: 129 mmol/L — ABNORMAL LOW (ref 135–145)
Total Bilirubin: 0.7 mg/dL (ref 0.3–1.2)
Total Protein: 5.7 g/dL — ABNORMAL LOW (ref 6.5–8.1)

## 2022-09-27 LAB — CBC
HCT: 25.5 % — ABNORMAL LOW (ref 39.0–52.0)
Hemoglobin: 8.8 g/dL — ABNORMAL LOW (ref 13.0–17.0)
MCH: 38.6 pg — ABNORMAL HIGH (ref 26.0–34.0)
MCHC: 34.5 g/dL (ref 30.0–36.0)
MCV: 111.8 fL — ABNORMAL HIGH (ref 80.0–100.0)
Platelets: 155 10*3/uL (ref 150–400)
RBC: 2.28 MIL/uL — ABNORMAL LOW (ref 4.22–5.81)
RDW: 12.3 % (ref 11.5–15.5)
WBC: 10 10*3/uL (ref 4.0–10.5)
nRBC: 0 % (ref 0.0–0.2)

## 2022-09-27 LAB — MAGNESIUM: Magnesium: 1.9 mg/dL (ref 1.7–2.4)

## 2022-09-27 LAB — CULTURE, BLOOD (ROUTINE X 2): Special Requests: ADEQUATE

## 2022-09-27 MED ORDER — SODIUM CHLORIDE 0.9 % IV SOLN
3.0000 g | Freq: Four times a day (QID) | INTRAVENOUS | Status: AC
  Administered 2022-09-27 – 2022-09-30 (×15): 3 g via INTRAVENOUS
  Filled 2022-09-27 (×15): qty 8

## 2022-09-27 MED ORDER — POTASSIUM CHLORIDE 20 MEQ PO PACK
40.0000 meq | PACK | Freq: Once | ORAL | Status: AC
  Administered 2022-09-27: 40 meq via ORAL
  Filled 2022-09-27: qty 2

## 2022-09-27 NOTE — Progress Notes (Signed)
Physical Therapy Treatment Patient Details Name: David Townsend MRN: [id number] DOB: 1961-04-21 Today's Date: 09/27/2022   History of Present Illness Pt is 62 yo male presented on 09/19/22 after a fall and generalized weakness.  Pt admitted with sepsis secondary to UTI and rhabdomyolysis. Left ribs 9-11 fractured.  Additionally, pt with ETOH abuse an on CIWA protocol. 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 to SDU 4/20    PT Comments    Pt seen in ICU Rm # [id number] General Comments: AxO x 3 pleasant and cooperative.  Max c/o weakness.  "been laying in bed a week", stated pt. Motivated Retired Journalist, newspaper. Assisted OOB to amb in hallway went well.  General transfer comment: 25% VC's on proper hand placement as well as safety using walker for stability.  Pt c/o B LE weakness.  General Gait Details: tolerated 24 feet + 2 asisst for safety with recliner following and IV pole.  Gait instability/weakness/deconditioned.  Remained on 2 lts oxygen with avg sats 94%.  Increased c/o back and L rib pain with activity. Returned to room in recliner. Prior to admit, pt was Indep/driving.  Will consult LPT.  Pt would benefit from a more aggressive Rehab such as AIR.     Recommendations for follow up therapy are one component of a multi-disciplinary discharge planning process, led by the attending physician.  Recommendations may be updated based on patient status, additional functional criteria and insurance authorization.  Follow Up Recommendations  Can patient physically be transported by private vehicle: No    Assistance Recommended at Discharge Frequent or constant Supervision/Assistance  Patient can return home with the following Two people to help with walking and/or transfers;A lot of help with bathing/dressing/bathroom;Assistance with cooking/housework;Direct supervision/assist for  financial management;Assist for transportation;Help with stairs or ramp for entrance   Equipment Recommendations  Rolling walker (2 wheels)    Recommendations for Other Services       Precautions / Restrictions Precautions Precautions: Fall Precaution Comments: Chronic Foley Restrictions Weight Bearing Restrictions: No     Mobility  Bed Mobility Overal bed mobility: Needs Assistance Bed Mobility: Supine to Sit     Supine to sit: Min assist, HOB elevated     General bed mobility comments: multimodal cues to use rails, extra time to push to sitting upright.  Caution to multiple lines.  Increased c/o back and L rib pain but tolerable.    Transfers Overall transfer level: Needs assistance Equipment used: Rolling walker (2 wheels) Transfers: Sit to/from Stand Sit to Stand: Min assist, +2 safety/equipment           General transfer comment: 25% VC's on proper hand placement as well as safety using walker for stability.  Pt c/o B LE weakness.    Ambulation/Gait Ambulation/Gait assistance: Min assist, Mod assist Gait Distance (Feet): 24 Feet Assistive device: Rolling walker (2 wheels) Gait Pattern/deviations: Step-to pattern, Decreased stride length, Shuffle Gait velocity: decreased     General Gait Details: tolerated 24 feet + 2 asisst for safety with recliner following and IV pole.  Gait instability/weakness/deconditioned.  Remained on 2 lts oxygen with avg sats 94%.  Increased c/o back and L rib pain with activity.   Stairs             Wheelchair Mobility    Modified Rankin (Stroke Patients Only)       Balance  Cognition Arousal/Alertness: Awake/alert Behavior During Therapy: WFL for tasks assessed/performed Overall Cognitive Status: Within Functional Limits for tasks assessed                                 General Comments: AxO x 3 pleasant and cooperative.  Max c/o  weakness.  "been laying in bed a week", stated pt. Motivated Retired Journalist, newspaper.        Exercises      General Comments        Pertinent Vitals/Pain Pain Assessment Pain Assessment: Faces Faces Pain Scale: Hurts a little bit Pain Location: back and L Rib's (Fx) Pain Descriptors / Indicators: Discomfort, Grimacing Pain Intervention(s): Monitored during session, Premedicated before session, Repositioned    Home Living                          Prior Function            PT Goals (current goals can now be found in the care plan section) Progress towards PT goals: Progressing toward goals    Frequency    Min 3X/week      PT Plan Current plan remains appropriate    Co-evaluation              AM-PAC PT "6 Clicks" Mobility   Outcome Measure  Help needed turning from your back to your side while in a flat bed without using bedrails?: A Lot Help needed moving from lying on your back to sitting on the side of a flat bed without using bedrails?: A Lot Help needed moving to and from a bed to a chair (including a wheelchair)?: A Lot Help needed standing up from a chair using your arms (e.g., wheelchair or bedside chair)?: A Lot Help needed to walk in hospital room?: A Lot Help needed climbing 3-5 steps with a railing? : A Lot 6 Click Score: 12    End of Session Equipment Utilized During Treatment: Gait belt;Oxygen Activity Tolerance: Patient tolerated treatment well Patient left: in chair;with call bell/phone within reach;with chair alarm set Nurse Communication: Mobility status PT Visit Diagnosis: Other abnormalities of gait and mobility (R26.89);Muscle weakness (generalized) (M62.81);History of falling (Z91.81)     Time: 6578-4696 PT Time Calculation (min) (ACUTE ONLY): 32 min  Charges:  $Gait Training: 8-22 mins $Therapeutic Activity: 8-22 mins                     Felecia Shelling  PTA Acute  Rehabilitation Services Office M-F           [phone]

## 2022-09-27 NOTE — Progress Notes (Signed)
° °  Inpatient Rehab Admissions Coordinator : ° °Per therapy change in recommendations, patient was screened for CIR candidacy by Cyndee Giammarco RN MSN.  At this time patient appears to be a potential candidate for CIR. I will place a rehab consult per protocol for full assessment. Please call me with any questions. ° °Cale Decarolis RN MSN °Admissions Coordinator °[phone] °  °

## 2022-09-27 NOTE — Consult Note (Signed)
[facility]    Date of Admission:  09/19/2022     Reason for Consult: sepsis    Referring Provider: Elvera Lennox     Lines:  Peripheral iv's  Abx: 4/25-c amp-sulb  4/21-25 nitrofurantoin 4/20-25 cefepime   4/21 vanc 4/18-20 ceftriaxone 4/17-18 cipro      Other: 4/21-c methyl pred  Assessment/plan 62 yo male with metastatic prostate cancer, indwelling folley, admitted with malaise/lethargy/sepsis initially treated as complicated uti, then developed acute hypoxemic respiratory distress on hd #5 with bilateral pulm infiltrate, and ongoing fever   #recurrent sepsis #hypoxemic respiratory distress #pcn allergy Ddx ards vs nosocomial pneumonia. Doubt fungemia. Query nitrofurantoin pneumotoxicity now but less likely Other possibility is pna complication such as parapneumonic effusion/empyema Was improving on cefepime/methylprednisolone down from hfnc on 4/20 to 2 liters o2 supplement today 4/25. However another febrile episode that was isolated and not associated with other concerning sign or sx No central line No other localizing sx   Will stop nitrofurantoin (started for e faecalis in urine but doubt need to treat that) Change cefepime to amp-sulb for ongoing pneumonia treatment defer steroid/steroid taper for ards per primary team He said he was told to have pcn allergy as a kid but doesn't know what effect. Likely had outgrown it and will try amp-sulb as above with close monitoring If continued improvement and no sign of other infectious complication/new infection will do 5 more days abx If ongoing fever without source will repeat chest ct r/o empyema Discussed with dr Elvera Lennox   #uti, complicated Initial dx on admission, started tx 4/17 and should have finished today Suspect ecoli cause and e faecalis a colonizer rather than pathogenic Stopped macrobid as above mentioned   I spent more than 80 minute reviewing data/chart, and  coordinating care, providing direct face to face time providing counseling/discussing diagnostics/treatment plan with patient and treatment team     ------------------------------------------------ Principal Problem:   Sepsis secondary to UTI Active Problems:   ETOH abuse   Hyperlipidemia   Prostate cancer metastatic to bone   Tobacco abuse   Essential hypertension   Rhabdomyolysis   Hyponatremia    HPI: David Townsend is a 62 y.o. male metastatic prostate cancer, indwelling folley, admitted with malaise/lethargy/sepsis initially treated as complicated uti, then developed acute hypoxemic respiratory distress on hd #5 with bilateral pulm infiltrate, and ongoing fever   He had lethargy on presentation for a few days and initially had fever No urinary sx or other sx otherwise  Urine cx on admission grew ecoli and e faecalis On appropriate abx and fever improving  However developed acute hypoxemic resp failure 4/20 transferred to icu He said he had no cough and didn't feel short of breath prior to that  No n/v/diarrhea/rash/joint pain  Abx changed to cefepime. Macrobid added 4/21 to continue e faecalis coverage Methyl pred was also added 4/21 for severe hypoxemic respiratory failure in setting bilateral pulm opacity of acute onset Chest cta no pe but bilateral pulm opacities and trace layering left effusion  Today 4/25 had improved no fever and o2 down to 2 liters. However had another fever today No leukocytosis No other sx   Foley previously present on admission has been changed  He had childhood pcn allergy but didn't know what it was. Doesn't recall taking pcn products. No frequent hospital admission  Prostate cancer metastatic on androgen deprivition therapy. Imaging here showed diffuse bony metastasis   Family History  Family  history unknown: Yes    Social History   Tobacco Use   Smoking status: Every Day    Packs/day: 2.00    Years: 34.00    Additional pack  years: 0.00    Total pack years: 68.00    Types: Cigarettes    Passive exposure: Current   Smokeless tobacco: Never  Vaping Use   Vaping Use: Never used  Substance Use Topics   Alcohol use: Yes    Alcohol/week: 0.0 standard drinks of alcohol    Comment: 5-6 beers per day   Drug use: No    Allergies  Allergen Reactions   Penicillins Other (See Comments)    Whelps     Review of Systems: ROS All Other ROS was negative, except mentioned above   Past Medical History:  Diagnosis Date   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   Essential hypertension 04/23/2022   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   Hyperlipidemia 03/27/2022   Penile lesion    Prostate cancer metastatic to bone 04/23/2022   Urinary retention 04/23/2022       Scheduled Meds:  amLODipine  10 mg Oral Daily   Chlorhexidine Gluconate Cloth  6 each Topical Daily   enoxaparin (LOVENOX) injection  40 mg Subcutaneous Q24H   folic acid  1 mg Oral Daily   lidocaine  1 patch Transdermal Q24H   methylPREDNISolone (SOLU-MEDROL) injection  80 mg Intravenous Daily   mometasone-formoterol  2 puff Inhalation BID   multivitamin with minerals  1 tablet Oral Daily   nicotine  21 mg Transdermal Daily   sodium chloride  1 g Oral BID WC   thiamine  100 mg Oral Daily   Or   thiamine  100 mg Intravenous Daily   Continuous Infusions:  ampicillin-sulbactam (UNASYN) IV     PRN Meds:.alum & mag hydroxide-simeth, HYDROcodone bit-homatropine, ibuprofen, ipratropium-albuterol, lip balm, menthol-cetylpyridinium, mouth rinse, traMADol   OBJECTIVE: Blood pressure (!) 163/82, pulse 74, temperature (S) (!) 102.9 F (39.4 C), temperature source Oral, resp. rate 19, height 5\' 9"  (1.753 m), weight 66.7 kg, SpO2 92 %.  Physical Exam  General/constitutional: no distress, pleasant, on 2 liters o2 via [place] HEENT: Normocephalic, PER, Conj Clear, EOMI, Oropharynx clear Neck supple CV: rrr no mrg Lungs: clear to auscultation, normal  respiratory effort Abd: Soft, Nontender Ext: no edema Skin: No Rash Neuro: nonfocal MSK: no peripheral joint swelling/tenderness/warmth; back spines nontender  Gu: foley present functioning  Central line presence: no   Lab Results Lab Results  Component Value Date   WBC 10.0 09/27/2022   HGB 8.8 (L) 09/27/2022   HCT 25.5 (L) 09/27/2022   MCV 111.8 (H) 09/27/2022   PLT 155 09/27/2022    Lab Results  Component Value Date   CREATININE 0.64 09/27/2022   BUN 19 09/27/2022   NA 129 (L) 09/27/2022   K 3.2 (L) 09/27/2022   CL 97 (L) 09/27/2022   CO2 22 09/27/2022    Lab Results  Component Value Date   ALT 198 (H) 09/27/2022   AST 321 (H) 09/27/2022   ALKPHOS 94 09/27/2022   BILITOT 0.7 09/27/2022      Microbiology: Recent Results (from the past 240 hour(s))  Culture, Urine (Do not remove urinary catheter, catheter placed by urology or difficult to place)     Status: Abnormal   Collection Time: 09/19/22 11:50 AM   Specimen: Urine, Catheterized  Result Value Ref Range Status   Specimen Description   Final    URINE, CATHETERIZED Performed  at [facility], [address], [place], [place] [address]    Special Requests   Final    NONE Performed at [facility], [address] [place], [place] [address]    Culture (A)  Final    >=100,000 COLONIES/mL ESCHERICHIA COLI 80,000 COLONIES/mL ENTEROCOCCUS FAECALIS    Report Status 09/23/2022 FINAL  Final   Organism ID, Bacteria ENTEROCOCCUS FAECALIS (A)  Final   Organism ID, Bacteria ESCHERICHIA COLI (A)  Final      Susceptibility   Escherichia coli - MIC*    AMPICILLIN 8 SENSITIVE Sensitive     CEFAZOLIN <=4 SENSITIVE Sensitive     CEFTRIAXONE <=0.25 SENSITIVE Sensitive     CIPROFLOXACIN <=0.25 SENSITIVE Sensitive     GENTAMICIN <=1 SENSITIVE Sensitive     IMIPENEM <=0.25 SENSITIVE Sensitive     NITROFURANTOIN <=16 SENSITIVE Sensitive     TRIMETH/SULFA <=20 SENSITIVE Sensitive      AMPICILLIN/SULBACTAM 8 SENSITIVE Sensitive     * >=100,000 COLONIES/mL ESCHERICHIA COLI   Enterococcus faecalis - MIC*    AMPICILLIN <=2 SENSITIVE Sensitive     NITROFURANTOIN <=16 SENSITIVE Sensitive     VANCOMYCIN 2 SENSITIVE Sensitive     * 80,000 COLONIES/mL ENTEROCOCCUS FAECALIS  Culture, blood (Routine X 2) w Reflex to ID Panel     Status: None   Collection Time: 09/20/22 11:48 AM   Specimen: BLOOD  Result Value Ref Range Status   Specimen Description   Final    BLOOD BLOOD RIGHT ARM AEROBIC BOTTLE ONLY Performed at [facility], [address] [place], [place] [address]    Special Requests   Final    BOTTLES DRAWN AEROBIC ONLY Blood Culture adequate volume Performed at [facility], [address] [place], [place] [address]    Culture   Final    NO GROWTH 5 DAYS Performed at [facility], [address] [place], [place] [address]    Report Status 09/25/2022 FINAL  Final  Culture, blood (Routine X 2) w Reflex to ID Panel     Status: None   Collection Time: 09/20/22 11:48 AM   Specimen: BLOOD  Result Value Ref Range Status   Specimen Description   Final    BLOOD BLOOD RIGHT HAND Performed at [facility], [address] [place], [place] [address]    Special Requests   Final    BOTTLES DRAWN AEROBIC ONLY Blood Culture adequate volume Performed at [facility], [address], [place], [place] [address]    Culture   Final    NO GROWTH 5 DAYS Performed at [facility], [address], [place], [place] [address]    Report Status 09/25/2022 FINAL  Final  MRSA Next Gen by PCR, Nasal     Status: None   Collection Time: 09/22/22 11:10 PM   Specimen: Nasal Mucosa; Nasal Swab  Result Value Ref Range Status   MRSA by PCR Next Gen NOT DETECTED NOT DETECTED Final    Comment: (NOTE) The GeneXpert MRSA Assay (FDA approved for NASAL specimens only), is one component of a comprehensive  MRSA colonization surveillance program. It is not intended to diagnose MRSA infection nor to guide or monitor treatment for MRSA infections. Test performance is not FDA approved in patients less than 80 years old. Performed at [facility], [address], [place], [place] [address]   SARS Coronavirus 2 by RT PCR (hospital order,  performed in [facility] hospital lab) *cepheid single result test* Anterior Nasal Swab     Status: None   Collection Time: 09/23/22  8:00 AM   Specimen: Anterior Nasal Swab  Result Value Ref Range Status   SARS Coronavirus 2 by RT PCR NEGATIVE NEGATIVE Final    Comment: (NOTE) SARS-CoV-2 target nucleic acids are NOT DETECTED.  The SARS-CoV-2 RNA is generally detectable in upper and lower respiratory specimens during the acute phase of infection. The lowest concentration of SARS-CoV-2 viral copies this assay can detect is 250 copies / mL. A negative result does not preclude SARS-CoV-2 infection and should not be used as the sole basis for treatment or other patient management decisions.  A negative result may occur with improper specimen collection / handling, submission of specimen other than nasopharyngeal swab, presence of viral mutation(s) within the areas targeted by this assay, and inadequate number of viral copies (<250 copies / mL). A negative result must be combined with clinical observations, patient history, and epidemiological information.  Fact Sheet for Patients:   RoadLapTop.co.za  Fact Sheet for Healthcare Providers: http://kim-miller.com/  This test is not yet approved or  cleared by the [place] FDA and has been authorized for detection and/or diagnosis of SARS-CoV-2 by FDA under an Emergency Use Authorization (EUA).  This EUA will remain in effect (meaning this test can be used) for the duration of the COVID-19 declaration under Section 564(b)(1) of the Act, 21  U.S.C. section 360bbb-3(b)(1), unless the authorization is terminated or revoked sooner.  Performed at [facility], [address] [place], [place] [address]   Culture, blood (Routine X 2) w Reflex to ID Panel     Status: None (Preliminary result)   Collection Time: 09/25/22 11:27 AM   Specimen: BLOOD  Result Value Ref Range Status   Specimen Description   Final    BLOOD BLOOD RIGHT ARM AEROBIC BOTTLE ONLY Performed at [facility], [address], [place], [place] [address]    Special Requests   Final    BOTTLES DRAWN AEROBIC ONLY Blood Culture adequate volume Performed at [facility], [address] [place], [place] [address]    Culture   Final    NO GROWTH 2 DAYS Performed at [facility], [address], [place], [place] [address]    Report Status PENDING  Incomplete  Culture, blood (Routine X 2) w Reflex to ID Panel     Status: None (Preliminary result)   Collection Time: 09/25/22 11:27 AM   Specimen: BLOOD  Result Value Ref Range Status   Specimen Description   Final    BLOOD BLOOD RIGHT HAND AEROBIC BOTTLE ONLY Performed at [facility], [address], [place], [place] [address]    Special Requests   Final    BOTTLES DRAWN AEROBIC ONLY Blood Culture adequate volume Performed at [facility], [address] [place], [place] [address]    Culture   Final    NO GROWTH 2 DAYS Performed at [facility], [address], [place], [place] [address]    Report Status PENDING  Incomplete     Serology:    Imaging: If present, new imagings (plain films, ct scans, and mri) have been personally visualized and interpreted; radiology reports have been reviewed. Decision making incorporated into the Impression / Recommendations.  4/25 chest xray 1. Persistent dense airspace consolidation in the left lower lobe with probable left pleural effusion.  2. Improvement in  the aeration of the left upper lung field. 3. Persistent patchy airspace consolidation in the right lung has slightly worsened.   4/21 ct angio chest 1. Respiratory motion.  No pulmonary embolus identified. 2. Severe multilobar Pneumonia is bilateral, worse in the left lung. Associated trace layering left pleural effusion. 3. Chronic sclerotic skeletal metastatic disease. But superimposed posttraumatic acute or subacute fractures of the Left ribs 9 through 11. 4. Aortic Atherosclerosis   4/20 cxr Findings most consistent with developing infiltrate throughout the left lung.   4/17 cxr No acute cardiopulmonary process.   Raymondo Band, MD [facility] [phone] pager    09/27/2022, 10:47 AM

## 2022-09-27 NOTE — Progress Notes (Signed)
PROGRESS NOTE  David Townsend ZOX:[id number] DOB: 06-26-1960 DOA: 09/19/2022 PCP: Rema Fendt, NP   LOS: 7 days   Brief Narrative / Interim history: 62 y.o. 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 Reports a few days ago he felt generalized weakness and fell in the bathroom. Had to crawl to get back into bed. Mostly has been in bed since. Today slipped out of bed and laid on floor for 3 hours unable to get up. States he has abdominal pain but thinks it is due to straining to try and get up off the floor. In the emergency department, patient was found to have UTI and was started on empiric antibiotic.  His Foley catheter was not exchanged, patient declined. Overnight 4/20, patient became hypoxic.  He required up to 10 L high flow oxygen.  CTA chest was completed which revealed multilobar pneumonia bilaterally.  He was transferred to stepdown unit.  Subjective / 24h Interval events: A bit sleepy this morning, but he denies any significant shortness of breath.  Has been coughing overnight.  Fever of 103 overnight  Assesement and Plan: Principal Problem:   Sepsis secondary to UTI Active Problems:   ETOH abuse   Hyperlipidemia   Prostate cancer metastatic to bone   Tobacco abuse   Essential hypertension   Rhabdomyolysis   Hyponatremia   Principal problem Sepsis secondary to UTI, HCAP -UTI related with chronic indwelling Foley catheter, patient was febrile, tachycardic on admission and met sepsis criteria.  He was COVID-negative, cultures are negative to date.  Urine culture showed E. coli and Enterococcus which are pansensitive.  He is currently on cefepime for HCAP, continue. -Febrile on 4/22 - 4/23 overnight, cultures were repeated at that time, without growth so far.  -Febrile again last night to 103, and 102 this  morning.  ID consulted  Active problems  Acute hypoxemic respiratory failure - Overnight 4/20-21, he became hypoxic, saturating 80% on room air.  Required up to 10 L high flow oxygen.  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  Sodium overall stable  Elevated LFTs -Hold Tylenol, Lipitor.  Hepatitis panel negative.  Right upper quadrant ultrasound with concern for cirrhosis.  2D echo unremarkable -LFTs improving, possibly in the setting of sepsis   Rhabdomyolysis -Improved with IV fluid   Metastatic prostate cancer -Followed by Dr. Berneice Heinrich, [facility]   Hypertension -Amlodipine. Avapro - on hold due to hyponatremia    Hyperlipidemia -Lipitor -on hold due to elevation in LFTs   Alcohol abuse -CIWA protocol, B1, folic acid   Tobacco abuse -Nicotine patch    Fall with generalized weakness -PT OT, suspect patient may need placement in SNF for discharge  Scheduled Meds:  amLODipine  10 mg Oral Daily   Chlorhexidine Gluconate Cloth  6 each Topical Daily   enoxaparin (LOVENOX) injection  40 mg Subcutaneous Q24H   folic acid  1 mg Oral Daily   lidocaine  1 patch Transdermal Q24H   methylPREDNISolone (SOLU-MEDROL) injection  80 mg Intravenous Daily   mometasone-formoterol  2 puff Inhalation BID   multivitamin with minerals  1 tablet Oral Daily   nicotine  21 mg Transdermal Daily   nitrofurantoin (macrocrystal-monohydrate)  100 mg Oral Q12H   sodium chloride  1 g Oral BID WC   thiamine  100 mg Oral Daily   Or   thiamine  100 mg Intravenous Daily   Continuous Infusions:  ceFEPime (MAXIPIME) IV Stopped (09/27/22 0823)   PRN Meds:.alum & mag hydroxide-simeth, HYDROcodone bit-homatropine, ibuprofen, ipratropium-albuterol, lip balm, menthol-cetylpyridinium, mouth rinse, traMADol  Current Outpatient Medications  Medication Instructions   acetaminophen (TYLENOL) 1,000 mg, Oral, Every 6 hours PRN   amLODipine  (NORVASC) 10 mg, Oral, Daily   Ascorbic Acid (VITAMIN C PO) 1 tablet, Oral, Daily   atorvastatin (LIPITOR) 40 mg, Oral, Daily   Erleada 120 mg, Oral, Daily   olmesartan (BENICAR) 40 MG tablet TAKE 1 TABLET(40 MG) BY MOUTH DAILY   spironolactone (ALDACTONE) 12.5 mg, Oral, Daily   VITAMIN D PO 1 capsule, Oral, Daily    Diet Orders (From admission, onward)     Start     Ordered   09/23/22 0742  Diet regular Room service appropriate? Yes; Fluid consistency: Thin; Fluid restriction: 1200 mL Fluid  Diet effective now       Question Answer Comment  Room service appropriate? Yes   Fluid consistency: Thin   Fluid restriction: 1200 mL Fluid      09/23/22 0741            DVT prophylaxis: enoxaparin (LOVENOX) injection 40 mg Start: 09/20/22 1000   Lab Results  Component Value Date   PLT 155 09/27/2022      Code Status: Full Code  Family Communication: no family at bedside   Status is: Inpatient  Remains inpatient appropriate because: severity of illness  Level of care: Stepdown  Consultants:  none  Objective: Vitals:   09/27/22 0800 09/27/22 0801 09/27/22 0848 09/27/22 0849  BP: (!) 163/82     Pulse: 93  89 89  Resp: (!) 22  (!) 23 (!) 25  Temp:      TempSrc:      SpO2: 93% 95% (!) 89% (!) 89%  Weight:      Height:        Intake/Output Summary (Last 24 hours) at 09/27/2022 0949 Last data filed at 09/27/2022 4098 Gross per 24 hour  Intake 1107.86 ml  Output 2400 ml  Net -1292.14 ml    Wt Readings from Last 3 Encounters:  09/22/22 66.7 kg  08/22/22 72.6 kg  07/24/22 74.4 kg    Examination:  Constitutional: NAD Eyes: lids and conjunctivae normal, no scleral icterus ENMT: mmm Neck: normal, supple Respiratory: clear to auscultation bilaterally, no wheezing, no crackles. Normal respiratory effort.  Cardiovascular: Regular rate and rhythm, no murmurs / rubs / gallops. No LE edema. Abdomen: soft, no distention, no tenderness. Bowel sounds positive.  Skin:  no rashes  Data Reviewed: I have independently reviewed following labs and imaging studies   CBC Recent Labs  Lab 09/22/22 0907 09/22/22 2333 09/24/22 0258 09/26/22 0258 09/27/22 0258  WBC 8.9 8.1 8.5 8.5 10.0  HGB 11.2* 11.1* 9.9* 9.0* 8.8*  HCT 31.5* 30.1* 27.6* 25.2* 25.5*  PLT 153 134* 143* 147* 155  MCV 111.3* 106.7* 109.5* 109.1* 111.8*  MCH 39.6* 39.4* 39.3* 39.0* 38.6*  MCHC 35.6 36.9* 35.9 35.7 34.5  RDW 11.9 11.8 11.9 11.9 12.3  LYMPHSABS  --  0.3*  --   --   --   MONOABS  --  0.3  --   --   --   EOSABS  --  0.0  --   --   --   BASOSABS  --  0.0  --   --   --  Recent Labs  Lab 09/22/22 0907 09/22/22 2333 09/22/22 2335 09/23/22 0144 09/23/22 0255 09/24/22 0258 09/25/22 0322 09/25/22 0802 09/26/22 0258 09/27/22 0258  NA 124* 120*  --   --  121* 127* 128*  --  128* 129*  K 3.0* 2.7*  --   --  2.9* 3.4* 3.6  --  3.1* 3.2*  CL 93* 89*  --   --  94* 98 94*  --  96* 97*  CO2 18* 15*  --   --  18* 18* 22  --  23 22  GLUCOSE 77 82  --   --  91 155* 116*  --  127* 110*  BUN 9 11  --   --  --  16 19  CREATININE 0.72 0.87  --   --  0.89 0.65 0.72  --  0.56* 0.64  CALCIUM 8.4* 7.8*  --   --  7.8* 8.5* 8.9  --  8.7* 8.6*  AST  --  392*  --   --   --   --  916*  --  520* 321*  ALT  --  86*  --   --   --   --  274*  --  223* 198*  ALKPHOS  --  58  --   --   --   --  103  --  95 94  BILITOT  --  1.4*  --   --   --   --  0.8  --  0.7 0.7  ALBUMIN  --  2.6*  --   --   --   --  2.6*  --  2.4* 2.4*  MG 1.5*  --  1.9  --  2.0 2.2  --   --   --  1.9  DDIMER  --  3.65*  --   --   --   --   --   --   --   --   PROCALCITON  --  6.43  --   --   --   --   --   --   --   --   LATICACIDVEN  --  0.9 0.9 1.1  --   --   --   --   --   --   BNP  --   --  275.5*  --   --   --   --  108.6*  --   --      ------------------------------------------------------------------------------------------------------------------ No results for input(s): "CHOL", "HDL", "LDLCALC",  "TRIG", "CHOLHDL", "LDLDIRECT" in the last 72 hours.  Lab Results  Component Value Date   HGBA1C 5.2 11/30/2021   ------------------------------------------------------------------------------------------------------------------ No results for input(s): "TSH", "T4TOTAL", "T3FREE", "THYROIDAB" in the last 72 hours.  Invalid input(s): "FREET3"  Cardiac Enzymes No results for input(s): "CKMB", "TROPONINI", "MYOGLOBIN" in the last 168 hours.  Invalid input(s): "CK" ------------------------------------------------------------------------------------------------------------------    Component Value Date/Time   BNP 108.6 (H) 09/25/2022 0802    CBG: No results for input(s): "GLUCAP" in the last 168 hours.  Recent Results (from the past 240 hour(s))  Culture, Urine (Do not remove urinary catheter, catheter placed by urology or difficult to place)     Status: Abnormal   Collection Time: 09/19/22 11:50 AM   Specimen: Urine, Catheterized  Result Value Ref Range Status   Specimen Description   Final    URINE, CATHETERIZED Performed at [facility], [address] [place], [place] [address]  Special Requests   Final    NONE Performed at [facility], [address], [place], [place] [address]    Culture (A)  Final    >=100,000 COLONIES/mL ESCHERICHIA COLI 80,000 COLONIES/mL ENTEROCOCCUS FAECALIS    Report Status 09/23/2022 FINAL  Final   Organism ID, Bacteria ENTEROCOCCUS FAECALIS (A)  Final   Organism ID, Bacteria ESCHERICHIA COLI (A)  Final      Susceptibility   Escherichia coli - MIC*    AMPICILLIN 8 SENSITIVE Sensitive     CEFAZOLIN <=4 SENSITIVE Sensitive     CEFTRIAXONE <=0.25 SENSITIVE Sensitive     CIPROFLOXACIN <=0.25 SENSITIVE Sensitive     GENTAMICIN <=1 SENSITIVE Sensitive     IMIPENEM <=0.25 SENSITIVE Sensitive     NITROFURANTOIN <=16 SENSITIVE Sensitive     TRIMETH/SULFA <=20 SENSITIVE Sensitive     AMPICILLIN/SULBACTAM 8  SENSITIVE Sensitive     * >=100,000 COLONIES/mL ESCHERICHIA COLI   Enterococcus faecalis - MIC*    AMPICILLIN <=2 SENSITIVE Sensitive     NITROFURANTOIN <=16 SENSITIVE Sensitive     VANCOMYCIN 2 SENSITIVE Sensitive     * 80,000 COLONIES/mL ENTEROCOCCUS FAECALIS  Culture, blood (Routine X 2) w Reflex to ID Panel     Status: None   Collection Time: 09/20/22 11:48 AM   Specimen: BLOOD  Result Value Ref Range Status   Specimen Description   Final    BLOOD BLOOD RIGHT ARM AEROBIC BOTTLE ONLY Performed at [facility], [address], [place], [place] [address]    Special Requests   Final    BOTTLES DRAWN AEROBIC ONLY Blood Culture adequate volume Performed at [facility], [address] [place], [place] [address]    Culture   Final    NO GROWTH 5 DAYS Performed at [facility], [address] [place], [place] [address]    Report Status 09/25/2022 FINAL  Final  Culture, blood (Routine X 2) w Reflex to ID Panel     Status: None   Collection Time: 09/20/22 11:48 AM   Specimen: BLOOD  Result Value Ref Range Status   Specimen Description   Final    BLOOD BLOOD RIGHT HAND Performed at [facility], [address] [place], [place] [address]    Special Requests   Final    BOTTLES DRAWN AEROBIC ONLY Blood Culture adequate volume Performed at [facility], [address] [place], [place] [address]    Culture   Final    NO GROWTH 5 DAYS Performed at [facility], [address], [place], [place] [address]    Report Status 09/25/2022 FINAL  Final  MRSA Next Gen by PCR, Nasal     Status: None   Collection Time: 09/22/22 11:10 PM   Specimen: Nasal Mucosa; Nasal Swab  Result Value Ref Range Status   MRSA by PCR Next Gen NOT DETECTED NOT DETECTED Final    Comment: (NOTE) The GeneXpert MRSA Assay (FDA approved for NASAL specimens only), is one component of a comprehensive MRSA colonization  surveillance program. It is not intended to diagnose MRSA infection nor to guide or monitor treatment for MRSA infections. Test performance is not FDA approved in patients less than 38 years old. Performed at [facility], [address] [place], [place] [address]   SARS Coronavirus 2 by RT PCR (hospital order, performed in [facility] hospital lab) *cepheid single result test* Anterior Nasal Swab  Status: None   Collection Time: 09/23/22  8:00 AM   Specimen: Anterior Nasal Swab  Result Value Ref Range Status   SARS Coronavirus 2 by RT PCR NEGATIVE NEGATIVE Final    Comment: (NOTE) SARS-CoV-2 target nucleic acids are NOT DETECTED.  The SARS-CoV-2 RNA is generally detectable in upper and lower respiratory specimens during the acute phase of infection. The lowest concentration of SARS-CoV-2 viral copies this assay can detect is 250 copies / mL. A negative result does not preclude SARS-CoV-2 infection and should not be used as the sole basis for treatment or other patient management decisions.  A negative result may occur with improper specimen collection / handling, submission of specimen other than nasopharyngeal swab, presence of viral mutation(s) within the areas targeted by this assay, and inadequate number of viral copies (<250 copies / mL). A negative result must be combined with clinical observations, patient history, and epidemiological information.  Fact Sheet for Patients:   RoadLapTop.co.za  Fact Sheet for Healthcare Providers: http://kim-miller.com/  This test is not yet approved or  cleared by the [place] FDA and has been authorized for detection and/or diagnosis of SARS-CoV-2 by FDA under an Emergency Use Authorization (EUA).  This EUA will remain in effect (meaning this test can be used) for the duration of the COVID-19 declaration under Section 564(b)(1) of the Act, 21 U.S.C. section  360bbb-3(b)(1), unless the authorization is terminated or revoked sooner.  Performed at [facility], [address] [place], [place] [address]   Culture, blood (Routine X 2) w Reflex to ID Panel     Status: None (Preliminary result)   Collection Time: 09/25/22 11:27 AM   Specimen: BLOOD  Result Value Ref Range Status   Specimen Description   Final    BLOOD BLOOD RIGHT ARM AEROBIC BOTTLE ONLY Performed at [facility], [address], [place], [place] [address]    Special Requests   Final    BOTTLES DRAWN AEROBIC ONLY Blood Culture adequate volume Performed at [facility], [address], [place], [place] [address]    Culture   Final    NO GROWTH 2 DAYS Performed at [facility], [address], [place], [place] [address]    Report Status PENDING  Incomplete  Culture, blood (Routine X 2) w Reflex to ID Panel     Status: None (Preliminary result)   Collection Time: 09/25/22 11:27 AM   Specimen: BLOOD  Result Value Ref Range Status   Specimen Description   Final    BLOOD BLOOD RIGHT HAND AEROBIC BOTTLE ONLY Performed at [facility], [address], [place], [place] [address]    Special Requests   Final    BOTTLES DRAWN AEROBIC ONLY Blood Culture adequate volume Performed at [facility], [address] [place], [place] [address]    Culture   Final    NO GROWTH 2 DAYS Performed at [facility], [address] [place], [place] [address]    Report Status PENDING  Incomplete     Radiology Studies: No results found.   Pamella Pert, MD, PhD [facility]  Between 7 am - 7 pm I am available, please contact me via Amion (for emergencies) or Securechat (non urgent messages)  Between 7 pm - 7 am I am not available, please contact night coverage MD/APP via Amion

## 2022-09-28 DIAGNOSIS — J181 Lobar pneumonia, unspecified organism: Secondary | ICD-10-CM | POA: Diagnosis not present

## 2022-09-28 DIAGNOSIS — J80 Acute respiratory distress syndrome: Secondary | ICD-10-CM

## 2022-09-28 DIAGNOSIS — N390 Urinary tract infection, site not specified: Secondary | ICD-10-CM | POA: Diagnosis not present

## 2022-09-28 DIAGNOSIS — A419 Sepsis, unspecified organism: Secondary | ICD-10-CM | POA: Diagnosis not present

## 2022-09-28 DIAGNOSIS — F17210 Nicotine dependence, cigarettes, uncomplicated: Secondary | ICD-10-CM | POA: Diagnosis not present

## 2022-09-28 LAB — COMPREHENSIVE METABOLIC PANEL
ALT: 176 U/L — ABNORMAL HIGH (ref 0–44)
AST: 230 U/L — ABNORMAL HIGH (ref 15–41)
Albumin: 2.2 g/dL — ABNORMAL LOW (ref 3.5–5.0)
Alkaline Phosphatase: 102 U/L (ref 38–126)
Anion gap: 10 (ref 5–15)
BUN: 16 mg/dL (ref 8–23)
CO2: 24 mmol/L (ref 22–32)
Calcium: 8.6 mg/dL — ABNORMAL LOW (ref 8.9–10.3)
Chloride: 94 mmol/L — ABNORMAL LOW (ref 98–111)
Creatinine, Ser: 0.59 mg/dL — ABNORMAL LOW (ref 0.61–1.24)
GFR, Estimated: >60 mL/min (ref >60)
Glucose, Bld: 97 mg/dL (ref 70–99)
Potassium: 3.4 mmol/L — ABNORMAL LOW (ref 3.5–5.1)
Sodium: 128 mmol/L — ABNORMAL LOW (ref 135–145)
Total Bilirubin: 0.7 mg/dL (ref 0.3–1.2)
Total Protein: 5.8 g/dL — ABNORMAL LOW (ref 6.5–8.1)

## 2022-09-28 LAB — CULTURE, BLOOD (ROUTINE X 2): Special Requests: ADEQUATE

## 2022-09-28 LAB — CBC
HCT: 24.1 % — ABNORMAL LOW (ref 39.0–52.0)
Hemoglobin: 8.7 g/dL — ABNORMAL LOW (ref 13.0–17.0)
MCH: 39.5 pg — ABNORMAL HIGH (ref 26.0–34.0)
MCHC: 36.1 g/dL — ABNORMAL HIGH (ref 30.0–36.0)
MCV: 109.5 fL — ABNORMAL HIGH (ref 80.0–100.0)
Platelets: 153 10*3/uL (ref 150–400)
RBC: 2.2 MIL/uL — ABNORMAL LOW (ref 4.22–5.81)
RDW: 12 % (ref 11.5–15.5)
WBC: 8.1 10*3/uL (ref 4.0–10.5)
nRBC: 0 % (ref 0.0–0.2)

## 2022-09-28 LAB — MAGNESIUM: Magnesium: 2 mg/dL (ref 1.7–2.4)

## 2022-09-28 MED ORDER — PREDNISONE 20 MG PO TABS
40.0000 mg | ORAL_TABLET | Freq: Every day | ORAL | Status: DC
  Administered 2022-09-29 – 2022-10-02 (×4): 40 mg via ORAL
  Filled 2022-09-28 (×4): qty 2

## 2022-09-28 MED ORDER — FUROSEMIDE 10 MG/ML IJ SOLN
40.0000 mg | Freq: Once | INTRAMUSCULAR | Status: AC
  Administered 2022-09-28: 40 mg via INTRAVENOUS
  Filled 2022-09-28: qty 4

## 2022-09-28 MED ORDER — POTASSIUM CHLORIDE CRYS ER 20 MEQ PO TBCR
40.0000 meq | EXTENDED_RELEASE_TABLET | Freq: Once | ORAL | Status: AC
  Administered 2022-09-28: 40 meq via ORAL
  Filled 2022-09-28: qty 2

## 2022-09-28 MED ORDER — SENNOSIDES-DOCUSATE SODIUM 8.6-50 MG PO TABS
1.0000 | ORAL_TABLET | Freq: Two times a day (BID) | ORAL | Status: DC
  Administered 2022-09-28 – 2022-10-04 (×5): 1 via ORAL
  Filled 2022-09-28 (×11): qty 1

## 2022-09-28 MED ORDER — CHLORHEXIDINE GLUCONATE CLOTH 2 % EX PADS
6.0000 | MEDICATED_PAD | Freq: Every day | CUTANEOUS | Status: DC
  Administered 2022-09-28 – 2022-10-04 (×7): 6 via TOPICAL

## 2022-09-28 NOTE — Plan of Care (Signed)

## 2022-09-28 NOTE — Progress Notes (Addendum)
Inpatient Rehab Admissions Coordinator:   Attempted to reach pt by phone but per EVS he was sleeping.  Will f/u again later today for CIR eval.  1134: pt working with PT.  Will try again later.   Estill Dooms, PT, DPT Admissions Coordinator [phone] 09/28/22  9:27 AM

## 2022-09-28 NOTE — Progress Notes (Signed)
PROGRESS NOTE  David Townsend YNW:[id number] DOB: 11/17/1960 DOA: 09/19/2022 PCP: Rema Fendt, NP   LOS: 8 days   Brief Narrative / Interim history: 62 y.o. 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 Reports a few days ago he felt generalized weakness and fell in the bathroom. Had to crawl to get back into bed. Mostly has been in bed since. Today slipped out of bed and laid on floor for 3 hours unable to get up. States he has abdominal pain but thinks it is due to straining to try and get up off the floor. In the emergency department, patient was found to have UTI and was started on empiric antibiotic.  His Foley catheter was not exchanged, patient declined. Overnight 4/20, patient became hypoxic.  He required up to 10 L high flow oxygen.  CTA chest was completed which revealed multilobar pneumonia bilaterally.  He was transferred to stepdown unit.  Subjective / 24h Interval events: He was well today but overall very weak.  Slept well.  Denies any chest pain, denies any shortness of breath.  Assesement and Plan: Principal Problem:   Sepsis secondary to UTI [facility]) Active Problems:   ETOH abuse   Hyperlipidemia   Prostate cancer metastatic to bone (HCC)   Tobacco abuse   Essential hypertension   Rhabdomyolysis   Hyponatremia   Principal problem Sepsis secondary to UTI, HCAP -UTI related with chronic indwelling Foley catheter, patient was febrile, tachycardic on admission and met sepsis criteria.  He was COVID-negative, blood cultures are negative to date.  Urine culture showed E. coli and Enterococcus which are pansensitive and has completed the treatment.  -Due to developing respiratory failure and concern for HCAP he was started on cefepime.  Due to persistent fevers ID consulted 4/25, currently narrowed to Unasyn.  No  fever last night, monitor fever curve  Active problems  Acute hypoxemic respiratory failure, concern for ARDS- Overnight 4/20-21, he became hypoxic, saturating 80% on room air.  Required up to 10 L high flow oxygen.  Weaning down to 2 L, chest x-ray yesterday with persistent infiltrates.  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  Sodium overall stable, 127 this morning  Elevated LFTs -Hold Tylenol, Lipitor.  Hepatitis panel negative.  Right upper quadrant ultrasound with concern for cirrhosis.  2D echo unremarkable -LFTs improving, possibly in the setting of sepsis   Rhabdomyolysis -Improved with IV fluid   Metastatic prostate cancer -Followed by Dr. Berneice Heinrich, [facility]   Hypertension -Amlodipine. Avapro - on hold due to hyponatremia    Hyperlipidemia -Lipitor -on hold due to elevation in LFTs   Alcohol abuse -CIWA protocol, B1, folic acid   Tobacco abuse -Nicotine patch    Fall with generalized weakness -PT OT, suspect patient may need placement in SNF for discharge  Scheduled Meds:  amLODipine  10 mg Oral Daily   enoxaparin (LOVENOX) injection  40 mg Subcutaneous Q24H   folic acid  1 mg Oral Daily   lidocaine  1 patch Transdermal Q24H   methylPREDNISolone (SOLU-MEDROL) injection  80 mg Intravenous Daily   mometasone-formoterol  2 puff Inhalation BID   multivitamin with minerals  1 tablet Oral Daily   nicotine  21 mg Transdermal Daily   potassium chloride  40 mEq Oral Once   sodium chloride  1 g Oral BID WC   thiamine  100 mg Oral Daily   Or   thiamine  100 mg Intravenous Daily   Continuous Infusions:  ampicillin-sulbactam (UNASYN) IV 3 g (09/28/22 0545)   PRN Meds:.alum & mag hydroxide-simeth, HYDROcodone bit-homatropine, ibuprofen, ipratropium-albuterol, lip balm, menthol-cetylpyridinium, mouth rinse, traMADol  Current Outpatient Medications   Medication Instructions   acetaminophen (TYLENOL) 1,000 mg, Oral, Every 6 hours PRN   amLODipine (NORVASC) 10 mg, Oral, Daily   Ascorbic Acid (VITAMIN C PO) 1 tablet, Oral, Daily   atorvastatin (LIPITOR) 40 mg, Oral, Daily   Erleada 120 mg, Oral, Daily   olmesartan (BENICAR) 40 MG tablet TAKE 1 TABLET(40 MG) BY MOUTH DAILY   spironolactone (ALDACTONE) 12.5 mg, Oral, Daily   VITAMIN D PO 1 capsule, Oral, Daily    Diet Orders (From admission, onward)     Start     Ordered   09/23/22 0742  Diet regular Room service appropriate? Yes; Fluid consistency: Thin; Fluid restriction: 1200 mL Fluid  Diet effective now       Question Answer Comment  Room service appropriate? Yes   Fluid consistency: Thin   Fluid restriction: 1200 mL Fluid      09/23/22 0741            DVT prophylaxis: enoxaparin (LOVENOX) injection 40 mg Start: 09/20/22 1000   Lab Results  Component Value Date   PLT 153 09/28/2022      Code Status: Full Code  Family Communication: no family at bedside   Status is: Inpatient  Remains inpatient appropriate because: severity of illness  Level of care: Progressive  Consultants:  none  Objective: Vitals:   09/28/22 0118 09/28/22 0515 09/28/22 0806 09/28/22 0819  BP: (!) 155/75 (!) 151/83  (!) 145/76  Pulse: 78 74  74  Resp: 17     Temp: 99.3 F (37.4 C) 98.9 F (37.2 C)  98.5 F (36.9 C)  TempSrc: Oral Oral  Oral  SpO2: 97% 94% 94% 96%  Weight:      Height:        Intake/Output Summary (Last 24 hours) at 09/28/2022 0947 Last data filed at 09/28/2022 0755 Gross per 24 hour  Intake 1032.98 ml  Output 2115 ml  Net -1082.02 ml    Wt Readings from Last 3 Encounters:  09/22/22 66.7 kg  08/22/22 72.6 kg  07/24/22 74.4 kg    Examination:  Constitutional: NAD Eyes: lids and conjunctivae normal, no scleral icterus ENMT: mmm Neck: normal, supple Respiratory: clear to auscultation bilaterally, no wheezing, no crackles. Normal respiratory effort.   Cardiovascular: Regular rate and rhythm, no murmurs / rubs / gallops. No LE edema. Abdomen: soft, no distention, no tenderness. Bowel sounds positive.  Skin: no rashes  Data Reviewed: I have independently reviewed following labs and imaging studies   CBC Recent Labs  Lab 09/22/22 2333 09/24/22 0258 09/26/22 0258 09/27/22 0258 09/28/22 0519  WBC 8.1 8.5 8.5 10.0 8.1  HGB 11.1* 9.9* 9.0* 8.8* 8.7*  HCT 30.1* 27.6* 25.2* 25.5* 24.1*  PLT 134* 143* 147* 155 153  MCV 106.7* 109.5* 109.1* 111.8* 109.5*  MCH 39.4* 39.3* 39.0* 38.6* 39.5*  MCHC 36.9* 35.9 35.7 34.5 36.1*  RDW 11.8 11.9 11.9 12.3 12.0  LYMPHSABS 0.3*  --   --   --   --   MONOABS 0.3  --   --   --   --   EOSABS 0.0  --   --   --   --  BASOSABS 0.0  --   --   --   --      Recent Labs  Lab 09/22/22 2333 09/22/22 2335 09/23/22 0144 09/23/22 0255 09/24/22 0258 09/25/22 0322 09/25/22 0802 09/26/22 0258 09/27/22 0258 09/28/22 0519  NA 120*  --   --  121* 127* 128*  --  128* 129* 128*  K 2.7*  --   --  2.9* 3.4* 3.6  --  3.1* 3.2* 3.4*  CL 89*  --   --  94* 98 94*  --  96* 97* 94*  CO2 15*  --   --  18* 18* 22  --  23 22 24   GLUCOSE 82  --   --  91 155* 116*  --  127* 110* 97  BUN 11  --   --  13 21 16   --  16 19 16   CREATININE 0.87  --   --  0.89 0.65 0.72  --  0.56* 0.64 0.59*  CALCIUM 7.8*  --   --  7.8* 8.5* 8.9  --  8.7* 8.6* 8.6*  AST 392*  --   --   --   --  916*  --  520* 321* 230*  ALT 86*  --   --   --   --  274*  --  223* 198* 176*  ALKPHOS 58  --   --   --   --  103  --  95 94 102  BILITOT 1.4*  --   --   --   --  0.8  --  0.7 0.7 0.7  ALBUMIN 2.6*  --   --   --   --  2.6*  --  2.4* 2.4* 2.2*  MG  --  1.9  --  2.0 2.2  --   --   --  1.9 2.0  DDIMER 3.65*  --   --   --   --   --   --   --   --   --   PROCALCITON 6.43  --   --   --   --   --   --   --   --   --   LATICACIDVEN 0.9 0.9 1.1  --   --   --   --   --   --   --   BNP  --  275.5*  --   --   --   --  108.6*  --   --   --       ------------------------------------------------------------------------------------------------------------------ No results for input(s): "CHOL", "HDL", "LDLCALC", "TRIG", "CHOLHDL", "LDLDIRECT" in the last 72 hours.  Lab Results  Component Value Date   HGBA1C 5.2 11/30/2021   ------------------------------------------------------------------------------------------------------------------ No results for input(s): "TSH", "T4TOTAL", "T3FREE", "THYROIDAB" in the last 72 hours.  Invalid input(s): "FREET3"  Cardiac Enzymes No results for input(s): "CKMB", "TROPONINI", "MYOGLOBIN" in the last 168 hours.  Invalid input(s): "CK" ------------------------------------------------------------------------------------------------------------------    Component Value Date/Time   BNP 108.6 (H) 09/25/2022 0802    CBG: No results for input(s): "GLUCAP" in the last 168 hours.  Recent Results (from the past 240 hour(s))  Culture, Urine (Do not remove urinary catheter, catheter placed by urology or difficult to place)     Status: Abnormal   Collection Time: 09/19/22 11:50 AM   Specimen: Urine, Catheterized  Result Value Ref Range Status   Specimen Description   Final    URINE, CATHETERIZED Performed at [facility], [address] [place], [place] [address]    Special Requests   Final    NONE Performed at [facility], [address] [place], [place] [address]    Culture (A)  Final    >=100,000 COLONIES/mL ESCHERICHIA COLI 80,000 COLONIES/mL ENTEROCOCCUS FAECALIS    Report Status 09/23/2022 FINAL  Final   Organism ID, Bacteria ENTEROCOCCUS FAECALIS (A)  Final   Organism ID, Bacteria ESCHERICHIA COLI (A)  Final      Susceptibility   Escherichia coli - MIC*    AMPICILLIN 8 SENSITIVE Sensitive     CEFAZOLIN <=4 SENSITIVE Sensitive     CEFTRIAXONE <=0.25 SENSITIVE Sensitive     CIPROFLOXACIN <=0.25 SENSITIVE Sensitive     GENTAMICIN <=1  SENSITIVE Sensitive     IMIPENEM <=0.25 SENSITIVE Sensitive     NITROFURANTOIN <=16 SENSITIVE Sensitive     TRIMETH/SULFA <=20 SENSITIVE Sensitive     AMPICILLIN/SULBACTAM 8 SENSITIVE Sensitive     * >=100,000 COLONIES/mL ESCHERICHIA COLI   Enterococcus faecalis - MIC*    AMPICILLIN <=2 SENSITIVE Sensitive     NITROFURANTOIN <=16 SENSITIVE Sensitive     VANCOMYCIN 2 SENSITIVE Sensitive     * 80,000 COLONIES/mL ENTEROCOCCUS FAECALIS  Culture, blood (Routine X 2) w Reflex to ID Panel     Status: None   Collection Time: 09/20/22 11:48 AM   Specimen: BLOOD  Result Value Ref Range Status   Specimen Description   Final    BLOOD BLOOD RIGHT ARM AEROBIC BOTTLE ONLY Performed at [facility], [address] [place], [place] [address]    Special Requests   Final    BOTTLES DRAWN AEROBIC ONLY Blood Culture adequate volume Performed at [facility], [address], [place], [place] [address]    Culture   Final    NO GROWTH 5 DAYS Performed at [facility], [address], [place], [place] [address]    Report Status 09/25/2022 FINAL  Final  Culture, blood (Routine X 2) w Reflex to ID Panel     Status: None   Collection Time: 09/20/22 11:48 AM   Specimen: BLOOD  Result Value Ref Range Status   Specimen Description   Final    BLOOD BLOOD RIGHT HAND Performed at [facility], [address] [place], [place] [address]    Special Requests   Final    BOTTLES DRAWN AEROBIC ONLY Blood Culture adequate volume Performed at [facility], [address] [place], [place] [address]    Culture   Final    NO GROWTH 5 DAYS Performed at [facility], [address], [place], [place] [address]    Report Status 09/25/2022 FINAL  Final  MRSA Next Gen by PCR, Nasal     Status: None   Collection Time: 09/22/22 11:10 PM   Specimen: Nasal Mucosa; Nasal Swab  Result Value Ref Range Status   MRSA by PCR Next Gen NOT  DETECTED NOT DETECTED Final    Comment: (NOTE) The GeneXpert MRSA Assay (FDA approved for NASAL specimens only), is one component of a comprehensive MRSA colonization surveillance program. It is not intended to diagnose MRSA infection nor to guide or monitor treatment for MRSA infections. Test performance is not FDA approved in patients less than 29 years old. Performed at [facility], [address], [place], [place] [address]   SARS Coronavirus 2 by RT PCR (hospital order, performed in [facility] hospital lab) *cepheid  single result test* Anterior Nasal Swab     Status: None   Collection Time: 09/23/22  8:00 AM   Specimen: Anterior Nasal Swab  Result Value Ref Range Status   SARS Coronavirus 2 by RT PCR NEGATIVE NEGATIVE Final    Comment: (NOTE) SARS-CoV-2 target nucleic acids are NOT DETECTED.  The SARS-CoV-2 RNA is generally detectable in upper and lower respiratory specimens during the acute phase of infection. The lowest concentration of SARS-CoV-2 viral copies this assay can detect is 250 copies / mL. A negative result does not preclude SARS-CoV-2 infection and should not be used as the sole basis for treatment or other patient management decisions.  A negative result may occur with improper specimen collection / handling, submission of specimen other than nasopharyngeal swab, presence of viral mutation(s) within the areas targeted by this assay, and inadequate number of viral copies (<250 copies / mL). A negative result must be combined with clinical observations, patient history, and epidemiological information.  Fact Sheet for Patients:   RoadLapTop.co.za  Fact Sheet for Healthcare Providers: http://kim-miller.com/  This test is not yet approved or  cleared by the [place] FDA and has been authorized for detection and/or diagnosis of SARS-CoV-2 by FDA under an Emergency Use Authorization (EUA).  This  EUA will remain in effect (meaning this test can be used) for the duration of the COVID-19 declaration under Section 564(b)(1) of the Act, 21 U.S.C. section 360bbb-3(b)(1), unless the authorization is terminated or revoked sooner.  Performed at [facility], [address] [place], [place] [address]   Culture, blood (Routine X 2) w Reflex to ID Panel     Status: None (Preliminary result)   Collection Time: 09/25/22 11:27 AM   Specimen: BLOOD  Result Value Ref Range Status   Specimen Description   Final    BLOOD BLOOD RIGHT ARM AEROBIC BOTTLE ONLY Performed at [facility], [address] [place], [place] [address]    Special Requests   Final    BOTTLES DRAWN AEROBIC ONLY Blood Culture adequate volume Performed at [facility], [address], [place], [place] [address]    Culture   Final    NO GROWTH 3 DAYS Performed at [facility], [address], [place], [place] [address]    Report Status PENDING  Incomplete  Culture, blood (Routine X 2) w Reflex to ID Panel     Status: None (Preliminary result)   Collection Time: 09/25/22 11:27 AM   Specimen: BLOOD  Result Value Ref Range Status   Specimen Description   Final    BLOOD BLOOD RIGHT HAND AEROBIC BOTTLE ONLY Performed at [facility], [address] [place], [place] [address]    Special Requests   Final    BOTTLES DRAWN AEROBIC ONLY Blood Culture adequate volume Performed at [facility], [address], [place], [place] [address]    Culture   Final    NO GROWTH 3 DAYS Performed at [facility], [address], [place], [place] [address]    Report Status PENDING  Incomplete     Radiology Studies: No results found.   Pamella Pert, MD, PhD [facility]  Between 7 am - 7 pm I am available, please contact me via Amion (for emergencies) or Securechat (non urgent messages)  Between 7 pm - 7 am I am not  available, please contact night coverage MD/APP via Amion

## 2022-09-28 NOTE — Progress Notes (Signed)
Physical Therapy Treatment Patient Details Name: David Townsend MRN: [id number] DOB: [birth year] Today's Date: 09/28/2022   History of Present Illness Pt is 62 yo male presented on 09/19/22 after a fall and generalized weakness.  Pt admitted with sepsis secondary to UTI and rhabdomyolysis. Left ribs 9-11 fractured.  Additionally, pt with ETOH abuse an on CIWA protocol. 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 to SDU 4/20    PT Comments    General Comments: AxO x 3 pleasant and cooperative.  Max c/o weakness.  "been laying in bed a week", stated pt. Motivated Retired Journalist, newspaper. Assisted OOB.  General bed mobility comments: increased time and assist due to back and L rib pain.  Increased coughing EOB then increased rib pain from coughing.  Remained on 2 lts to achieve sats > 90%.  Assisted with amb in hallway.  General transfer comment: 25% VC's on proper hand placement as well as safety using walker for stability.  Pt c/o B LE weakness. General Gait Details: tolerated amb 30  feet + 2 asisst for safety with recliner following and IV pole.  Gait instability/weakness/deconditioned.  Remained on 2 lts oxygen with avg sats 94%.  Increased c/o back and L rib pain with activity. Returned to room in recliner.  Pt progressing well.     Recommendations for follow up therapy are one component of a multi-disciplinary discharge planning process, led by the attending physician.  Recommendations may be updated based on patient status, additional functional criteria and insurance authorization.  Follow Up Recommendations  Can patient physically be transported by private vehicle: No    Assistance Recommended at Discharge Frequent or constant Supervision/Assistance  Patient can return home with the following Two people to help with walking and/or transfers;A lot of help with  bathing/dressing/bathroom;Assistance with cooking/housework;Direct supervision/assist for financial management;Assist for transportation;Help with stairs or ramp for entrance   Equipment Recommendations  Rolling walker (2 wheels)    Recommendations for Other Services       Precautions / Restrictions Precautions Precautions: Fall Precaution Comments: Chronic Foley, monitor sats (PNA) Restrictions Weight Bearing Restrictions: No     Mobility  Bed Mobility Overal bed mobility: Needs Assistance Bed Mobility: Supine to Sit     Supine to sit: Min assist, HOB elevated     General bed mobility comments: increased time and assist due to back and L rib pain    Transfers Overall transfer level: Needs assistance Equipment used: Rolling walker (2 wheels) Transfers: Sit to/from Stand Sit to Stand: Min assist, +2 safety/equipment           General transfer comment: 25% VC's on proper hand placement as well as safety using walker for stability.  Pt c/o B LE weakness.    Ambulation/Gait Ambulation/Gait assistance: Min assist Gait Distance (Feet): 30 Feet Assistive device: Rolling walker (2 wheels) Gait Pattern/deviations: Step-to pattern, Decreased stride length, Shuffle Gait velocity: decreased     General Gait Details: tolerated amb 30  feet + 2 asisst for safety with recliner following and IV pole.  Gait instability/weakness/deconditioned.  Remained on 2 lts oxygen with avg sats 94%.  Increased c/o back and L rib pain with activity.   Stairs             Wheelchair Mobility    Modified Rankin (Stroke Patients Only)       Balance  Cognition Arousal/Alertness: Awake/alert Behavior During Therapy: WFL for tasks assessed/performed Overall Cognitive Status: Within Functional Limits for tasks assessed                                 General Comments: AxO x 3 pleasant and cooperative.  Max  c/o weakness.  "been laying in bed a week", stated pt. Motivated Retired Journalist, newspaper.        Exercises      General Comments        Pertinent Vitals/Pain Pain Assessment Pain Assessment: Faces Faces Pain Scale: Hurts a little bit Pain Location: back and L Rib's (Fx) Pain Descriptors / Indicators: Discomfort, Grimacing Pain Intervention(s): Monitored during session    Home Living                          Prior Function            PT Goals (current goals can now be found in the care plan section) Progress towards PT goals: Progressing toward goals    Frequency    Min 3X/week      PT Plan Current plan remains appropriate    Co-evaluation              AM-PAC PT "6 Clicks" Mobility   Outcome Measure  Help needed turning from your back to your side while in a flat bed without using bedrails?: A Lot Help needed moving from lying on your back to sitting on the side of a flat bed without using bedrails?: A Lot Help needed moving to and from a bed to a chair (including a wheelchair)?: A Lot Help needed standing up from a chair using your arms (e.g., wheelchair or bedside chair)?: A Lot Help needed to walk in hospital room?: A Lot Help needed climbing 3-5 steps with a railing? : A Lot 6 Click Score: 12    End of Session Equipment Utilized During Treatment: Gait belt;Oxygen Activity Tolerance: Patient tolerated treatment well Patient left: in chair;with call bell/phone within reach;with chair alarm set Nurse Communication: Mobility status PT Visit Diagnosis: Other abnormalities of gait and mobility (R26.89);Muscle weakness (generalized) (M62.81);History of falling (Z91.81)     Time: 1610-9604 PT Time Calculation (min) (ACUTE ONLY): 24 min  Charges:  $Gait Training: 8-22 mins $Therapeutic Activity: 8-22 mins                     Felecia Shelling  PTA Acute  Rehabilitation Services Office M-F          [phone]

## 2022-09-28 NOTE — Progress Notes (Signed)
[facility]  Date of Admission:  09/19/2022     Lines:  Peripheral iv's   Abx: 4/25-c amp-sulb   4/21-25 nitrofurantoin 4/20-25 cefepime           4/21 vanc 4/18-20 ceftriaxone 4/17-18 cipro                                        Other: 4/21-c methyl pred   Assessment/plan 62 yo male with metastatic prostate cancer, indwelling folley, admitted with malaise/lethargy/sepsis initially treated as complicated uti, then developed acute hypoxemic respiratory distress on hd #5 with bilateral pulm infiltrate, and ongoing fever     #recurrent sepsis #hypoxemic respiratory distress #pcn allergy Ddx ards vs nosocomial pneumonia. Doubt fungemia. Query nitrofurantoin pneumotoxicity now but less likely Other possibility is pna complication such as parapneumonic effusion/empyema Was improving on cefepime/methylprednisolone down from hfnc on 4/20 to 2 liters o2 supplement today 4/25. However another febrile episode that was isolated and not associated with other concerning sign or sx No central line No other localizing sx   Stopped nitrofurantoin 4/25 and changed cefepime to amp-sulb for more targetted/narrow spectrum abx --tolerated that and will remove penicillin allergy from his list defer steroid/steroid taper for ards per primary team Plan 2 more days abx and will finish on 4/28 If ongoing fever through this weekend, will repeat chest ct r/o empyema Discussed with dr Lafe Garin     #uti, complicated Initial dx on admission, started tx 4/17 and should have finished today Suspect ecoli cause and e faecalis a colonizer rather than pathogenic Stopped macrobid as above mentioned  Principal Problem:   Sepsis secondary to UTI (HCC) Active Problems:   ETOH abuse   Hyperlipidemia   Prostate cancer metastatic to bone (HCC)   Tobacco abuse   Essential hypertension   Rhabdomyolysis   Hyponatremia   No Active Allergies  Scheduled Meds:   amLODipine  10 mg Oral Daily   enoxaparin (LOVENOX) injection  40 mg Subcutaneous Q24H   folic acid  1 mg Oral Daily   furosemide  40 mg Intravenous Once   lidocaine  1 patch Transdermal Q24H   mometasone-formoterol  2 puff Inhalation BID   multivitamin with minerals  1 tablet Oral Daily   nicotine  21 mg Transdermal Daily   potassium chloride  40 mEq Oral Once   [START ON 09/29/2022] predniSONE  40 mg Oral Q breakfast   sodium chloride  1 g Oral BID WC   thiamine  100 mg Oral Daily   Or   thiamine  100 mg Intravenous Daily   Continuous Infusions:  ampicillin-sulbactam (UNASYN) IV 3 g (09/28/22 0545)   PRN Meds:.alum & mag hydroxide-simeth, HYDROcodone bit-homatropine, ibuprofen, ipratropium-albuterol, lip balm, menthol-cetylpyridinium, mouth rinse, traMADol   SUBJECTIVE: Felt well Moved out of icu Afebrile last 24 hours No n/v/diarrhea Still on minimal o2 supplement   Tolerated amp-sulbactam yesterday no rash/cough/dyspnea   Review of Systems: ROS All other ROS was negative, except mentioned above     OBJECTIVE: Vitals:   09/28/22 0118 09/28/22 0515 09/28/22 0806 09/28/22 0819  BP: (!) 155/75 (!) 151/83  (!) 145/76  Pulse: 78 74  74  Resp: 17     Temp: 99.3 F (37.4 C) 98.9 F (37.2 C)  98.5 F (36.9 C)  TempSrc: Oral Oral  Oral  SpO2: 97% 94% 94% 96%  Weight:      Height:       Body mass index is 21.72 kg/m.  Physical Exam  General/constitutional: no distress, pleasant; 2 liters o2 HEENT: Normocephalic, PER, Conj Clear, EOMI, Oropharynx clear Neck supple CV: rrr no mrg Lungs: clear to auscultation, normal respiratory effort Abd: Soft, Nontender Ext: no edema Skin: No Rash Neuro: nonfocal MSK: no peripheral joint swelling/tenderness/warmth; back spines nontender   Lab Results Lab Results  Component Value Date   WBC 8.1 09/28/2022   HGB 8.7 (L) 09/28/2022   HCT 24.1 (L) 09/28/2022   MCV 109.5 (H) 09/28/2022   PLT 153 09/28/2022    Lab  Results  Component Value Date   CREATININE 0.59 (L) 09/28/2022   BUN 16 09/28/2022   NA 128 (L) 09/28/2022   K 3.4 (L) 09/28/2022   CL 94 (L) 09/28/2022   CO2 24 09/28/2022    Lab Results  Component Value Date   ALT 176 (H) 09/28/2022   AST 230 (H) 09/28/2022   ALKPHOS 102 09/28/2022   BILITOT 0.7 09/28/2022      Microbiology: Recent Results (from the past 240 hour(s))  Culture, Urine (Do not remove urinary catheter, catheter placed by urology or difficult to place)     Status: Abnormal   Collection Time: 09/19/22 11:50 AM   Specimen: Urine, Catheterized  Result Value Ref Range Status   Specimen Description   Final    URINE, CATHETERIZED Performed at [facility], [address], [place], [place] [address]    Special Requests   Final    NONE Performed at [facility], [address] [place], [place] [address]    Culture (A)  Final    >=100,000 COLONIES/mL ESCHERICHIA COLI 80,000 COLONIES/mL ENTEROCOCCUS FAECALIS    Report Status 09/23/2022 FINAL  Final   Organism ID, Bacteria ENTEROCOCCUS FAECALIS (A)  Final   Organism ID, Bacteria ESCHERICHIA COLI (A)  Final      Susceptibility   Escherichia coli - MIC*    AMPICILLIN 8 SENSITIVE Sensitive     CEFAZOLIN <=4 SENSITIVE Sensitive     CEFTRIAXONE <=0.25 SENSITIVE Sensitive     CIPROFLOXACIN <=0.25 SENSITIVE Sensitive     GENTAMICIN <=1 SENSITIVE Sensitive     IMIPENEM <=0.25 SENSITIVE Sensitive     NITROFURANTOIN <=16 SENSITIVE Sensitive     TRIMETH/SULFA <=20 SENSITIVE Sensitive     AMPICILLIN/SULBACTAM 8 SENSITIVE Sensitive     * >=100,000 COLONIES/mL ESCHERICHIA COLI   Enterococcus faecalis - MIC*    AMPICILLIN <=2 SENSITIVE Sensitive     NITROFURANTOIN <=16 SENSITIVE Sensitive     VANCOMYCIN 2 SENSITIVE Sensitive     * 80,000 COLONIES/mL ENTEROCOCCUS FAECALIS  Culture, blood (Routine X 2) w Reflex to ID Panel     Status: None   Collection Time: 09/20/22 11:48 AM    Specimen: BLOOD  Result Value Ref Range Status   Specimen Description   Final    BLOOD BLOOD RIGHT ARM AEROBIC BOTTLE ONLY Performed at [facility], [address] [place], [place] [address]    Special Requests   Final    BOTTLES DRAWN AEROBIC ONLY Blood Culture adequate volume Performed at [facility], [address], [place], [place] [address]    Culture   Final    NO GROWTH 5 DAYS Performed at [facility], [address], [place], [place] [address]    Report Status 09/25/2022 FINAL  Final  Culture, blood (Routine  X 2) w Reflex to ID Panel     Status: None   Collection Time: 09/20/22 11:48 AM   Specimen: BLOOD  Result Value Ref Range Status   Specimen Description   Final    BLOOD BLOOD RIGHT HAND Performed at [facility], [address] [place], [place] [address]    Special Requests   Final    BOTTLES DRAWN AEROBIC ONLY Blood Culture adequate volume Performed at [facility], [address], [place], [place] [address]    Culture   Final    NO GROWTH 5 DAYS Performed at [facility], [address], [place], [place] [address]    Report Status 09/25/2022 FINAL  Final  MRSA Next Gen by PCR, Nasal     Status: None   Collection Time: 09/22/22 11:10 PM   Specimen: Nasal Mucosa; Nasal Swab  Result Value Ref Range Status   MRSA by PCR Next Gen NOT DETECTED NOT DETECTED Final    Comment: (NOTE) The GeneXpert MRSA Assay (FDA approved for NASAL specimens only), is one component of a comprehensive MRSA colonization surveillance program. It is not intended to diagnose MRSA infection nor to guide or monitor treatment for MRSA infections. Test performance is not FDA approved in patients less than 82 years old. Performed at [facility], [address] [place], [place] [address]   SARS Coronavirus 2 by RT PCR (hospital order, performed in [facility] hospital lab) *cepheid  single result test* Anterior Nasal Swab     Status: None   Collection Time: 09/23/22  8:00 AM   Specimen: Anterior Nasal Swab  Result Value Ref Range Status   SARS Coronavirus 2 by RT PCR NEGATIVE NEGATIVE Final    Comment: (NOTE) SARS-CoV-2 target nucleic acids are NOT DETECTED.  The SARS-CoV-2 RNA is generally detectable in upper and lower respiratory specimens during the acute phase of infection. The lowest concentration of SARS-CoV-2 viral copies this assay can detect is 250 copies / mL. A negative result does not preclude SARS-CoV-2 infection and should not be used as the sole basis for treatment or other patient management decisions.  A negative result may occur with improper specimen collection / handling, submission of specimen other than nasopharyngeal swab, presence of viral mutation(s) within the areas targeted by this assay, and inadequate number of viral copies (<250 copies / mL). A negative result must be combined with clinical observations, patient history, and epidemiological information.  Fact Sheet for Patients:   RoadLapTop.co.za  Fact Sheet for Healthcare Providers: http://kim-miller.com/  This test is not yet approved or  cleared by the [place] FDA and has been authorized for detection and/or diagnosis of SARS-CoV-2 by FDA under an Emergency Use Authorization (EUA).  This EUA will remain in effect (meaning this test can be used) for the duration of the COVID-19 declaration under Section 564(b)(1) of the Act, 21 U.S.C. section 360bbb-3(b)(1), unless the authorization is terminated or revoked sooner.  Performed at [facility], [address], [place], [place] [address]   Culture, blood (Routine X 2) w Reflex to ID Panel     Status: None (Preliminary result)   Collection Time: 09/25/22 11:27 AM   Specimen: BLOOD  Result Value Ref Range Status   Specimen Description   Final    BLOOD BLOOD  RIGHT ARM AEROBIC BOTTLE ONLY Performed at [facility], [address], [place], [place] [address]    Special Requests   Final  BOTTLES DRAWN AEROBIC ONLY Blood Culture adequate volume Performed at [facility], [address] [place], [place] [address]    Culture   Final    NO GROWTH 3 DAYS Performed at [facility], [address], [place], [place] [address]    Report Status PENDING  Incomplete  Culture, blood (Routine X 2) w Reflex to ID Panel     Status: None (Preliminary result)   Collection Time: 09/25/22 11:27 AM   Specimen: BLOOD  Result Value Ref Range Status   Specimen Description   Final    BLOOD BLOOD RIGHT HAND AEROBIC BOTTLE ONLY Performed at [facility], [address], [place], [place] [address]    Special Requests   Final    BOTTLES DRAWN AEROBIC ONLY Blood Culture adequate volume Performed at [facility], [address] [place], [place] [address]    Culture   Final    NO GROWTH 3 DAYS Performed at [facility], [address] [place], [place] [address]    Report Status PENDING  Incomplete     Serology:   Imaging: If present, new imagings (plain films, ct scans, and mri) have been personally visualized and interpreted; radiology reports have been reviewed. Decision making incorporated into the Impression / Recommendations.   4/25 chest xray 1. Persistent dense airspace consolidation in the left lower lobe with probable left pleural effusion. 2. Improvement in the aeration of the left upper lung field. 3. Persistent patchy airspace consolidation in the right lung has slightly worsened.     4/21 ct angio chest 1. Respiratory motion.  No pulmonary embolus identified. 2. Severe multilobar Pneumonia is bilateral, worse in the left lung. Associated trace layering left pleural effusion. 3. Chronic sclerotic skeletal metastatic disease. But  superimposed posttraumatic acute or subacute fractures of the Left ribs 9 through 11. 4. Aortic Atherosclerosis     4/20 cxr Findings most consistent with developing infiltrate throughout the left lung.     4/17 cxr No acute cardiopulmonary process.   Raymondo Band, MD [facility] [phone] pager    09/28/2022, 10:30 AM

## 2022-09-28 NOTE — TOC Progression Note (Signed)
Transition of Care [facility]) - Progression Note    Patient Details  Name: David Townsend MRN: [id number] Date of Birth: [birth year]  [facility] CM/SW Contact  Gelsey Amyx, Olegario Messier, RN Phone Number: 09/28/2022, 11:37 AM  Clinical Narrative: Noted CIR following. Continue to monitor.      Expected Discharge Plan: IP Rehab Facility Barriers to Discharge: Continued Medical Work up  Expected Discharge Plan and Services                                               Social Determinants of Health (SDOH) Interventions SDOH Screenings   Food Insecurity: No Food Insecurity (09/21/2022)  Housing: Low Risk  (09/21/2022)  Transportation Needs: No Transportation Needs (09/21/2022)  Utilities: Not At Risk (09/21/2022)  Depression (PHQ2-9): Low Risk  (08/22/2022)  Tobacco Use: High Risk (09/19/2022)    Readmission Risk Interventions     No data to display

## 2022-09-29 DIAGNOSIS — A419 Sepsis, unspecified organism: Secondary | ICD-10-CM | POA: Diagnosis not present

## 2022-09-29 DIAGNOSIS — J80 Acute respiratory distress syndrome: Secondary | ICD-10-CM | POA: Diagnosis not present

## 2022-09-29 DIAGNOSIS — J181 Lobar pneumonia, unspecified organism: Secondary | ICD-10-CM | POA: Diagnosis not present

## 2022-09-29 LAB — BASIC METABOLIC PANEL
Anion gap: 12 (ref 5–15)
BUN: 17 mg/dL (ref 8–23)
CO2: 25 mmol/L (ref 22–32)
Calcium: 8.7 mg/dL — ABNORMAL LOW (ref 8.9–10.3)
Chloride: 95 mmol/L — ABNORMAL LOW (ref 98–111)
Creatinine, Ser: 0.67 mg/dL (ref 0.61–1.24)
GFR, Estimated: >60 mL/min (ref >60)
Glucose, Bld: 106 mg/dL — ABNORMAL HIGH (ref 70–99)
Potassium: 3.5 mmol/L (ref 3.5–5.1)
Sodium: 132 mmol/L — ABNORMAL LOW (ref 135–145)

## 2022-09-29 LAB — CULTURE, BLOOD (ROUTINE X 2): Culture: NO GROWTH

## 2022-09-29 MED ORDER — POTASSIUM CHLORIDE CRYS ER 20 MEQ PO TBCR
40.0000 meq | EXTENDED_RELEASE_TABLET | Freq: Once | ORAL | Status: AC
  Administered 2022-09-29: 40 meq via ORAL
  Filled 2022-09-29: qty 2

## 2022-09-29 MED ORDER — FUROSEMIDE 10 MG/ML IJ SOLN
40.0000 mg | Freq: Once | INTRAMUSCULAR | Status: AC
  Administered 2022-09-29: 40 mg via INTRAVENOUS
  Filled 2022-09-29: qty 4

## 2022-09-29 NOTE — Progress Notes (Signed)
   09/29/22 0851  Oxygen Therapy/Pulse Ox  O2 Device Nasal Cannula  O2 Therapy Oxygen  O2 Flow Rate (L/min) 2 L/min  FiO2 (%) 28 %  SpO2 93 %  Safety Instructions Yes (Comment)

## 2022-09-29 NOTE — Progress Notes (Signed)
PROGRESS NOTE  David Townsend UEA:[id number] DOB: [birth year] DOA: 09/19/2022 PCP: Rema Fendt, NP   LOS: 9 days   Brief Narrative / Interim history: 62 y.o. 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 Reports a few days ago he felt generalized weakness and fell in the bathroom. Had to crawl to get back into bed. Mostly has been in bed since. Today slipped out of bed and laid on floor for 3 hours unable to get up. States he has abdominal pain but thinks it is due to straining to try and get up off the floor. In the emergency department, patient was found to have UTI and was started on empiric antibiotic.  His Foley catheter was not exchanged, patient declined. Overnight 4/20, patient became hypoxic.  He required up to 10 L high flow oxygen.  CTA chest was completed which revealed multilobar pneumonia bilaterally.  He was transferred to stepdown unit.  Subjective / 24h Interval events: He is feeling better and better.  Still weak.  Has residual cough, but no more fevers or shortness of breath.  Assesement and Plan: Principal Problem:   Sepsis (HCC) Active Problems:   ETOH abuse   Hyperlipidemia   Prostate cancer metastatic to bone (HCC)   Tobacco abuse   Essential hypertension   Rhabdomyolysis   Hyponatremia   Lobar pneumonia, unspecified organism (HCC)   ARDS (adult respiratory distress syndrome) (HCC)   Principal problem Sepsis secondary to UTI, HCAP -UTI related with chronic indwelling Foley catheter, patient was febrile, tachycardic on admission and met sepsis criteria.  He was COVID-negative, blood cultures are negative to date.  Urine culture showed E. coli and Enterococcus which are pansensitive and has completed the treatment.  -Due to developing respiratory failure and concern for HCAP he was started on cefepime.   Due to persistent fevers ID consulted 4/25, currently narrowed to Unasyn.  Remains afebrile  Active problems  Acute hypoxemic respiratory failure, concern for ARDS- Overnight 4/20-21, he became hypoxic, saturating 80% on room air.  Required up to 10 L high flow oxygen.  Currently on room air.  Repeat Lasix today.  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  Improved with diuresis yesterday, 132  Elevated LFTs -Hold Tylenol, Lipitor.  Hepatitis panel negative.  Right upper quadrant ultrasound with concern for cirrhosis.  2D echo unremarkable -LFTs improving, possibly in the setting of sepsis   Rhabdomyolysis -Improved with IV fluid   Metastatic prostate cancer -Followed by Dr. Berneice Heinrich, [facility]   Hypertension -Amlodipine. Avapro - on hold due to hyponatremia    Hyperlipidemia -Lipitor -on hold due to elevation in LFTs   Alcohol abuse -CIWA protocol, B1, folic acid   Tobacco abuse -Nicotine patch    Fall with generalized weakness -PT OT, suspect patient may need placement in SNF for discharge  Scheduled Meds:  amLODipine  10 mg Oral Daily   Chlorhexidine Gluconate Cloth  6 each Topical Daily   enoxaparin (LOVENOX) injection  40 mg Subcutaneous Q24H   folic acid  1 mg Oral Daily   furosemide  40 mg Intravenous Once   lidocaine  1 patch Transdermal Q24H   mometasone-formoterol  2 puff Inhalation BID   multivitamin with minerals  1 tablet Oral Daily   nicotine  21 mg Transdermal Daily   potassium chloride  40 mEq Oral Once   predniSONE  40 mg Oral Q breakfast   senna-docusate  1 tablet Oral BID   sodium chloride  1 g Oral BID WC   thiamine  100 mg Oral Daily   Or   thiamine  100 mg Intravenous Daily   Continuous Infusions:  ampicillin-sulbactam (UNASYN) IV 3 g (09/29/22 0544)   PRN Meds:.alum & mag hydroxide-simeth, HYDROcodone bit-homatropine, ibuprofen, ipratropium-albuterol, lip balm,  menthol-cetylpyridinium, mouth rinse, traMADol  Current Outpatient Medications  Medication Instructions   acetaminophen (TYLENOL) 1,000 mg, Oral, Every 6 hours PRN   amLODipine (NORVASC) 10 mg, Oral, Daily   Ascorbic Acid (VITAMIN C PO) 1 tablet, Oral, Daily   atorvastatin (LIPITOR) 40 mg, Oral, Daily   Erleada 120 mg, Oral, Daily   olmesartan (BENICAR) 40 MG tablet TAKE 1 TABLET(40 MG) BY MOUTH DAILY   spironolactone (ALDACTONE) 12.5 mg, Oral, Daily   VITAMIN D PO 1 capsule, Oral, Daily    Diet Orders (From admission, onward)     Start     Ordered   09/23/22 0742  Diet regular Room service appropriate? Yes; Fluid consistency: Thin; Fluid restriction: 1200 mL Fluid  Diet effective now       Question Answer Comment  Room service appropriate? Yes   Fluid consistency: Thin   Fluid restriction: 1200 mL Fluid      09/23/22 0741            DVT prophylaxis: enoxaparin (LOVENOX) injection 40 mg Start: 09/20/22 1000   Lab Results  Component Value Date   PLT 153 09/28/2022      Code Status: Full Code  Family Communication: no family at bedside   Status is: Inpatient  Remains inpatient appropriate because: severity of illness  Level of care: Progressive  Consultants:  none  Objective: Vitals:   09/29/22 0452 09/29/22 0845 09/29/22 0851 09/29/22 0859  BP: (!) 143/77   127/73  Pulse: 76   76  Resp:      Temp: 99 F (37.2 C)     TempSrc: Oral     SpO2: 92% (!) 88% 93%   Weight:      Height:        Intake/Output Summary (Last 24 hours) at 09/29/2022 1008 Last data filed at 09/29/2022 2130 Gross per 24 hour  Intake 1599.83 ml  Output 5025 ml  Net -3425.17 ml    Wt Readings from Last 3 Encounters:  09/22/22 66.7 kg  08/22/22 72.6 kg  07/24/22 74.4 kg    Examination:  Constitutional: NAD Eyes: lids and conjunctivae normal, no scleral icterus ENMT: mmm Neck: normal, supple Respiratory: clear to auscultation bilaterally, no wheezing, no crackles. Normal  respiratory effort.  Cardiovascular: Regular rate and rhythm, no murmurs / rubs / gallops. No LE edema. Abdomen: soft, no distention, no tenderness. Bowel sounds positive.  Skin: no rashes  Data Reviewed: I have independently reviewed following labs and imaging studies   CBC Recent Labs  Lab 09/22/22 2333 09/24/22 0258 09/26/22 0258 09/27/22 0258 09/28/22 0519  WBC 8.1 8.5 8.5 10.0 8.1  HGB 11.1* 9.9* 9.0* 8.8* 8.7*  HCT 30.1* 27.6* 25.2* 25.5* 24.1*  PLT 134* 143* 147* 155 153  MCV 106.7* 109.5* 109.1* 111.8* 109.5*  MCH 39.4* 39.3* 39.0* 38.6* 39.5*  MCHC 36.9* 35.9 35.7 34.5 36.1*  RDW 11.8 11.9 11.9 12.3 12.0  LYMPHSABS 0.3*  --   --   --   --   MONOABS 0.3  --   --   --   --  EOSABS 0.0  --   --   --   --   BASOSABS 0.0  --   --   --   --      Recent Labs  Lab 09/22/22 2333 09/22/22 2335 09/23/22 0144 09/23/22 0255 09/24/22 0258 09/25/22 0322 09/25/22 0802 09/26/22 0258 09/27/22 0258 09/28/22 0519 09/29/22 0528  NA 120*  --   --  121* 127* 128*  --  128* 129* 128* 132*  K 2.7*  --   --  2.9* 3.4* 3.6  --  3.1* 3.2* 3.4* 3.5  CL 89*  --   --  94* 98 94*  --  96* 97* 94* 95*  CO2 15*  --   --  18* 18* 22  --  23 22 24 25   GLUCOSE 82  --   --  91 155* 116*  --  127* 110* 97 106*  BUN 11  --   --  13 21 16   --  16 19 16 17   CREATININE 0.87  --   --  0.89 0.65 0.72  --  0.56* 0.64 0.59* 0.67  CALCIUM 7.8*  --   --  7.8* 8.5* 8.9  --  8.7* 8.6* 8.6* 8.7*  AST 392*  --   --   --   --  916*  --  520* 321* 230*  --   ALT 86*  --   --   --   --  274*  --  223* 198* 176*  --   ALKPHOS 58  --   --   --   --  103  --  95 94 102  --   BILITOT 1.4*  --   --   --   --  0.8  --  0.7 0.7 0.7  --   ALBUMIN 2.6*  --   --   --   --  2.6*  --  2.4* 2.4* 2.2*  --   MG  --  1.9  --  2.0 2.2  --   --   --  1.9 2.0  --   DDIMER 3.65*  --   --   --   --   --   --   --   --   --   --   PROCALCITON 6.43  --   --   --   --   --   --   --   --   --   --   LATICACIDVEN 0.9 0.9 1.1   --   --   --   --   --   --   --   --   BNP  --  275.5*  --   --   --   --  108.6*  --   --   --   --      ------------------------------------------------------------------------------------------------------------------ No results for input(s): "CHOL", "HDL", "LDLCALC", "TRIG", "CHOLHDL", "LDLDIRECT" in the last 72 hours.  Lab Results  Component Value Date   HGBA1C 5.2 11/30/2021   ------------------------------------------------------------------------------------------------------------------ No results for input(s): "TSH", "T4TOTAL", "T3FREE", "THYROIDAB" in the last 72 hours.  Invalid input(s): "FREET3"  Cardiac Enzymes No results for input(s): "CKMB", "TROPONINI", "MYOGLOBIN" in the last 168 hours.  Invalid input(s): "CK" ------------------------------------------------------------------------------------------------------------------    Component Value Date/Time   BNP 108.6 (H) 09/25/2022 0802    CBG: No results for input(s): "GLUCAP" in the last 168 hours.  Recent Results (from the past 240 hour(s))  Culture, Urine (Do not  remove urinary catheter, catheter placed by urology or difficult to place)     Status: Abnormal   Collection Time: 09/19/22 11:50 AM   Specimen: Urine, Catheterized  Result Value Ref Range Status   Specimen Description   Final    URINE, CATHETERIZED Performed at [facility], [address] [place], [place] [address]    Special Requests   Final    NONE Performed at [facility], [address] [place], [place] [address]    Culture (A)  Final    >=100,000 COLONIES/mL ESCHERICHIA COLI 80,000 COLONIES/mL ENTEROCOCCUS FAECALIS    Report Status 09/23/2022 FINAL  Final   Organism ID, Bacteria ENTEROCOCCUS FAECALIS (A)  Final   Organism ID, Bacteria ESCHERICHIA COLI (A)  Final      Susceptibility   Escherichia coli - MIC*    AMPICILLIN 8 SENSITIVE Sensitive     CEFAZOLIN <=4 SENSITIVE Sensitive      CEFTRIAXONE <=0.25 SENSITIVE Sensitive     CIPROFLOXACIN <=0.25 SENSITIVE Sensitive     GENTAMICIN <=1 SENSITIVE Sensitive     IMIPENEM <=0.25 SENSITIVE Sensitive     NITROFURANTOIN <=16 SENSITIVE Sensitive     TRIMETH/SULFA <=20 SENSITIVE Sensitive     AMPICILLIN/SULBACTAM 8 SENSITIVE Sensitive     * >=100,000 COLONIES/mL ESCHERICHIA COLI   Enterococcus faecalis - MIC*    AMPICILLIN <=2 SENSITIVE Sensitive     NITROFURANTOIN <=16 SENSITIVE Sensitive     VANCOMYCIN 2 SENSITIVE Sensitive     * 80,000 COLONIES/mL ENTEROCOCCUS FAECALIS  Culture, blood (Routine X 2) w Reflex to ID Panel     Status: None   Collection Time: 09/20/22 11:48 AM   Specimen: BLOOD  Result Value Ref Range Status   Specimen Description   Final    BLOOD BLOOD RIGHT ARM AEROBIC BOTTLE ONLY Performed at [facility], [address], [place], [place] [address]    Special Requests   Final    BOTTLES DRAWN AEROBIC ONLY Blood Culture adequate volume Performed at [facility], [address] [place], [place] [address]    Culture   Final    NO GROWTH 5 DAYS Performed at [facility], [address], [place], [place] [address]    Report Status 09/25/2022 FINAL  Final  Culture, blood (Routine X 2) w Reflex to ID Panel     Status: None   Collection Time: 09/20/22 11:48 AM   Specimen: BLOOD  Result Value Ref Range Status   Specimen Description   Final    BLOOD BLOOD RIGHT HAND Performed at [facility], [address], [place], [place] [address]    Special Requests   Final    BOTTLES DRAWN AEROBIC ONLY Blood Culture adequate volume Performed at [facility], [address] [place], [place] [address]    Culture   Final    NO GROWTH 5 DAYS Performed at [facility], [address] [place], [place] [address]    Report Status 09/25/2022 FINAL  Final  MRSA Next Gen by PCR, Nasal     Status: None   Collection Time: 09/22/22  11:10 PM   Specimen: Nasal Mucosa; Nasal Swab  Result Value Ref Range Status   MRSA by PCR Next Gen NOT DETECTED NOT DETECTED Final    Comment: (NOTE) The GeneXpert MRSA Assay (FDA approved for NASAL specimens only), is one component of a comprehensive MRSA colonization surveillance program. It is not intended to  diagnose MRSA infection nor to guide or monitor treatment for MRSA infections. Test performance is not FDA approved in patients less than 29 years old. Performed at [facility], [address], [place], [place] [address]   SARS Coronavirus 2 by RT PCR (hospital order, performed in [facility] hospital lab) *cepheid single result test* Anterior Nasal Swab     Status: None   Collection Time: 09/23/22  8:00 AM   Specimen: Anterior Nasal Swab  Result Value Ref Range Status   SARS Coronavirus 2 by RT PCR NEGATIVE NEGATIVE Final    Comment: (NOTE) SARS-CoV-2 target nucleic acids are NOT DETECTED.  The SARS-CoV-2 RNA is generally detectable in upper and lower respiratory specimens during the acute phase of infection. The lowest concentration of SARS-CoV-2 viral copies this assay can detect is 250 copies / mL. A negative result does not preclude SARS-CoV-2 infection and should not be used as the sole basis for treatment or other patient management decisions.  A negative result may occur with improper specimen collection / handling, submission of specimen other than nasopharyngeal swab, presence of viral mutation(s) within the areas targeted by this assay, and inadequate number of viral copies (<250 copies / mL). A negative result must be combined with clinical observations, patient history, and epidemiological information.  Fact Sheet for Patients:   RoadLapTop.co.za  Fact Sheet for Healthcare Providers: http://kim-miller.com/  This test is not yet approved or  cleared by the [place] FDA and has been  authorized for detection and/or diagnosis of SARS-CoV-2 by FDA under an Emergency Use Authorization (EUA).  This EUA will remain in effect (meaning this test can be used) for the duration of the COVID-19 declaration under Section 564(b)(1) of the Act, 21 U.S.C. section 360bbb-3(b)(1), unless the authorization is terminated or revoked sooner.  Performed at [facility], [address] [place], [place] [address]   Culture, blood (Routine X 2) w Reflex to ID Panel     Status: None (Preliminary result)   Collection Time: 09/25/22 11:27 AM   Specimen: BLOOD  Result Value Ref Range Status   Specimen Description   Final    BLOOD BLOOD RIGHT ARM AEROBIC BOTTLE ONLY Performed at [facility], [address], [place], [place] [address]    Special Requests   Final    BOTTLES DRAWN AEROBIC ONLY Blood Culture adequate volume Performed at [facility], [address], [place], [place] [address]    Culture   Final    NO GROWTH 4 DAYS Performed at [facility], [address] [place], [place] [address]    Report Status PENDING  Incomplete  Culture, blood (Routine X 2) w Reflex to ID Panel     Status: None (Preliminary result)   Collection Time: 09/25/22 11:27 AM   Specimen: BLOOD  Result Value Ref Range Status   Specimen Description   Final    BLOOD BLOOD RIGHT HAND AEROBIC BOTTLE ONLY Performed at [facility], [address], [place], [place] [address]    Special Requests   Final    BOTTLES DRAWN AEROBIC ONLY Blood Culture adequate volume Performed at [facility], [address], [place], [place] [address]    Culture   Final    NO GROWTH 4 DAYS Performed at [facility], [address], [place], [place] [address]    Report Status PENDING  Incomplete     Radiology Studies: No results found.  Pamella Pert, MD, PhD [facility]  Between 7 am - 7 pm I am available,  please contact me via Amion (for emergencies) or Securechat (non urgent messages)  Between 7 pm - 7 am I am not available, please contact night coverage MD/APP via Amion

## 2022-09-29 NOTE — Progress Notes (Signed)
Physical Therapy Treatment Patient Details Name: David Townsend MRN: [id number] DOB: 24-Aug-1960 Today's Date: 09/29/2022   History of Present Illness Pt is 62 yo male presented on 09/19/22 after a fall and generalized weakness.  Pt admitted with sepsis secondary to UTI and rhabdomyolysis. Left ribs 9-11 fractured.  Additionally, pt with ETOH abuse an on CIWA protocol. 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 to SDU 4/20    PT Comments    Pt voicing frustrations regarding time spent on [facility] and now having back pain. Pt agreeable to seated exercises, able to perform with good muscle activation, cued for motor control and isometric hold at end range. Pt with increased back pain with STS reps, declines more reps and ambulation due to pain medication "not fully kicked in". Repositioned pt with elevated BLE in recliner and all needs in reach.   Recommendations for follow up therapy are one component of a multi-disciplinary discharge planning process, led by the attending physician.  Recommendations may be updated based on patient status, additional functional criteria and insurance authorization.  Follow Up Recommendations  Can patient physically be transported by private vehicle: No    Assistance Recommended at Discharge Frequent or constant Supervision/Assistance  Patient can return home with the following A lot of help with bathing/dressing/bathroom;Assistance with cooking/housework;Direct supervision/assist for financial management;Assist for transportation;Help with stairs or ramp for entrance;A lot of help with walking and/or transfers   Equipment Recommendations  Rolling walker (2 wheels)    Recommendations for Other Services       Precautions / Restrictions Precautions Precautions: Fall Precaution Comments: Chronic Foley Restrictions Weight Bearing  Restrictions: No     Mobility  Bed Mobility  General bed mobility comments: in recliner    Transfers Overall transfer level: Needs assistance Equipment used: Rolling walker (2 wheels) Transfers: Sit to/from Stand Sit to Stand: Min assist  General transfer comment: min A to power up to stand for 2 reps, verbal cues for hand placement to power up to standing, increased back pain and pt requests to stop    Ambulation/Gait  General Gait Details: back pain too painful   Stairs             Wheelchair Mobility    Modified Rankin (Stroke Patients Only)       Balance Overall balance assessment: Needs assistance, History of Falls  Standing balance support: During functional activity, Single extremity supported Standing balance-Leahy Scale: Poor Standing balance comment: static standing able to release single hand from RW, neutral upright posture       Cognition Arousal/Alertness: Awake/alert Behavior During Therapy: WFL for tasks assessed/performed Overall Cognitive Status: Within Functional Limits for tasks assessed  General Comments: pt pleasant, follows commands. pt agitated with nursing due to time it takes to get things, frustrated about fluid restriction, but agreeable to therapy.        Exercises General Exercises - Lower Extremity Ankle Circles/Pumps: Seated, AROM, Both, 10 reps Quad Sets: Seated, AROM, Strengthening, Both, 10 reps (BLE elevated on recliner) Long Arc Quad: Seated, AROM, Strengthening, Both, 15 reps Hip ABduction/ADduction: Seated, AROM, Strengthening, Both, 10 reps Hip Flexion/Marching: Seated, AROM, Strengthening, Both, 10 reps    General Comments        Pertinent Vitals/Pain Pain Assessment Pain Assessment: Faces Faces Pain Scale: Hurts whole lot Pain Location: back Pain Descriptors / Indicators: Discomfort, Grimacing Pain Intervention(s): Limited activity within patient's tolerance,  Monitored during session, Repositioned,  Premedicated before session    Home Living                          Prior Function            PT Goals (current goals can now be found in the care plan section) Acute Rehab PT Goals Patient Stated Goal: regain strength PT Goal Formulation: With patient Time For Goal Achievement: 10/05/22 Potential to Achieve Goals: Good Progress towards PT goals: Progressing toward goals    Frequency    Min 3X/week      PT Plan Current plan remains appropriate    Co-evaluation              AM-PAC PT "6 Clicks" Mobility   Outcome Measure  Help needed turning from your back to your side while in a flat bed without using bedrails?: A Lot Help needed moving from lying on your back to sitting on the side of a flat bed without using bedrails?: A Lot Help needed moving to and from a bed to a chair (including a wheelchair)?: A Lot Help needed standing up from a chair using your arms (e.g., wheelchair or bedside chair)?: A Little Help needed to walk in hospital room?: A Lot Help needed climbing 3-5 steps with a railing? : A Lot 6 Click Score: 13    End of Session Equipment Utilized During Treatment: Gait belt;Oxygen Activity Tolerance: Patient tolerated treatment well Patient left: in chair;with call bell/phone within reach;with chair alarm set Nurse Communication: Mobility status PT Visit Diagnosis: Other abnormalities of gait and mobility (R26.89);Muscle weakness (generalized) (M62.81);History of falling (Z91.81)     Time: 3664-4034 PT Time Calculation (min) (ACUTE ONLY): 18 min  Charges:  $Therapeutic Exercise: 8-22 mins                     Tori Analyah Mcconnon PT, DPT 09/29/22, 12:25 PM

## 2022-09-29 NOTE — Progress Notes (Signed)
   09/29/22 0845  Aerosol Therapy Tx  $ Hand Held Nebulizer  1  Medications Dulera  Solution Other (Comment)  Delivery Source Other (Comment)  Delivery Device MDI  Pre-Treatment Pulse 77  Pre-Treatment Respirations 14  Treatment Tolerance Tolerated well  Treatment Given 1  RT Breath Sounds  Bilateral Breath Sounds Fine crackles  R Upper  Breath Sounds Diminished  L Upper Breath Sounds Diminished  R Lower Breath Sounds Fine crackles  L Lower Breath Sounds Fine crackles  Oxygen Therapy/Pulse Ox  O2 Device Room Air  O2 Therapy Oxygen;Room air  FiO2 (%) 21 %  SpO2 (!) 88 % (room air 88%, pt won't cough, states that it hurts his chest when he coughs, RT explained to pt that it would help him to cough out secretions, crackles bases, applied 2 L [place].)  Safety Instructions Yes (Comment)

## 2022-09-29 NOTE — Plan of Care (Signed)
  Problem: Education: Goal: Knowledge of General Education information will improve Description Including pain rating scale, medication(s)/side effects and non-pharmacologic comfort measures Outcome: Progressing   Problem: Activity: Goal: Risk for activity intolerance will decrease Outcome: Progressing   Problem: Safety: Goal: Ability to remain free from injury will improve Outcome: Progressing   

## 2022-09-30 DIAGNOSIS — J181 Lobar pneumonia, unspecified organism: Secondary | ICD-10-CM | POA: Diagnosis not present

## 2022-09-30 DIAGNOSIS — J80 Acute respiratory distress syndrome: Secondary | ICD-10-CM | POA: Diagnosis not present

## 2022-09-30 DIAGNOSIS — A419 Sepsis, unspecified organism: Secondary | ICD-10-CM | POA: Diagnosis not present

## 2022-09-30 LAB — CBC
HCT: 27 % — ABNORMAL LOW (ref 39.0–52.0)
Hemoglobin: 9.2 g/dL — ABNORMAL LOW (ref 13.0–17.0)
MCH: 38 pg — ABNORMAL HIGH (ref 26.0–34.0)
MCHC: 34.1 g/dL (ref 30.0–36.0)
MCV: 111.6 fL — ABNORMAL HIGH (ref 80.0–100.0)
Platelets: 168 10*3/uL (ref 150–400)
RBC: 2.42 MIL/uL — ABNORMAL LOW (ref 4.22–5.81)
RDW: 12.1 % (ref 11.5–15.5)
WBC: 6.4 10*3/uL (ref 4.0–10.5)
nRBC: 0 % (ref 0.0–0.2)

## 2022-09-30 LAB — MAGNESIUM: Magnesium: 2 mg/dL (ref 1.7–2.4)

## 2022-09-30 LAB — COMPREHENSIVE METABOLIC PANEL
ALT: 218 U/L — ABNORMAL HIGH (ref 0–44)
AST: 189 U/L — ABNORMAL HIGH (ref 15–41)
Albumin: 2.5 g/dL — ABNORMAL LOW (ref 3.5–5.0)
Alkaline Phosphatase: 89 U/L (ref 38–126)
Anion gap: 15 (ref 5–15)
BUN: 15 mg/dL (ref 8–23)
CO2: 24 mmol/L (ref 22–32)
Calcium: 8.9 mg/dL (ref 8.9–10.3)
Chloride: 96 mmol/L — ABNORMAL LOW (ref 98–111)
Creatinine, Ser: 0.57 mg/dL — ABNORMAL LOW (ref 0.61–1.24)
GFR, Estimated: >60 mL/min (ref >60)
Glucose, Bld: 93 mg/dL (ref 70–99)
Potassium: 3.6 mmol/L (ref 3.5–5.1)
Sodium: 135 mmol/L (ref 135–145)
Total Bilirubin: 0.6 mg/dL (ref 0.3–1.2)
Total Protein: 6.1 g/dL — ABNORMAL LOW (ref 6.5–8.1)

## 2022-09-30 LAB — CULTURE, BLOOD (ROUTINE X 2): Culture: NO GROWTH

## 2022-09-30 MED ORDER — SODIUM CHLORIDE 0.9 % IV SOLN: INTRAVENOUS | Status: DC | PRN

## 2022-09-30 NOTE — Progress Notes (Signed)
PROGRESS NOTE  David Townsend LKG:[id number] DOB: Sep 30, 1960 DOA: 09/19/2022 PCP: Rema Fendt, NP   LOS: 10 days   Brief Narrative / Interim history: 62 y.o. 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 Reports a few days ago he felt generalized weakness and fell in the bathroom. Had to crawl to get back into bed. Mostly has been in bed since. Today slipped out of bed and laid on floor for 3 hours unable to get up. States he has abdominal pain but thinks it is due to straining to try and get up off the floor. In the emergency department, patient was found to have UTI and was started on empiric antibiotic.  His Foley catheter was not exchanged, patient declined. Overnight 4/20, patient became hypoxic.  He required up to 10 L high flow oxygen.  CTA chest was completed which revealed multilobar pneumonia bilaterally.  He was transferred to stepdown unit.  Subjective / 24h Interval events: Feels well.  Persistent weakness but otherwise he denies any chest pain, denies any shortness of breath.  Very upset about fluid restriction  Assesement and Plan: Principal Problem:   Sepsis (HCC) Active Problems:   ETOH abuse   Hyperlipidemia   Prostate cancer metastatic to bone (HCC)   Tobacco abuse   Essential hypertension   Rhabdomyolysis   Hyponatremia   Lobar pneumonia, unspecified organism (HCC)   ARDS (adult respiratory distress syndrome) (HCC)   Principal problem Sepsis secondary to UTI, HCAP -UTI related with chronic indwelling Foley catheter, patient was febrile, tachycardic on admission and met sepsis criteria.  He was COVID-negative, blood cultures are negative to date.  Urine culture showed E. coli and Enterococcus which are pansensitive and has completed the treatment.  -Due to developing respiratory failure and concern for  HCAP he was started on cefepime.  Due to persistent fevers ID consulted 4/25, currently narrowed to Unasyn.  Remaining afebrile, he is scheduled to finish antibiotics this afternoon  Active problems  Acute hypoxemic respiratory failure, concern for ARDS- Overnight 4/20-21, he became hypoxic, saturating 80% on room air.  Required up to 10 L high flow oxygen.  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  Sodium has normalized  Elevated LFTs -Hold Tylenol, Lipitor.  Hepatitis panel negative.  Right upper quadrant ultrasound with concern for cirrhosis.  2D echo unremarkable -LFTs improving, and overall stable   Rhabdomyolysis -Improved with IV fluid   Metastatic prostate cancer -Followed by Dr. Berneice Heinrich, [facility]   Hypertension -continue amlodipine.  Normotensive   Hyperlipidemia -Lipitor -on hold due to elevation in LFTs   Alcohol abuse -CIWA protocol, B1, folic acid   Tobacco abuse -Nicotine patch    Fall with generalized weakness -PT OT, suspect patient may need placement in SNF for discharge  Scheduled Meds:  amLODipine  10 mg Oral Daily   Chlorhexidine Gluconate Cloth  6 each Topical Daily   enoxaparin (LOVENOX) injection  40 mg Subcutaneous Q24H   folic acid  1 mg Oral Daily   lidocaine  1 patch Transdermal Q24H   mometasone-formoterol  2 puff Inhalation BID   multivitamin with minerals  1 tablet Oral Daily   nicotine  21 mg Transdermal Daily   predniSONE  40 mg Oral Q breakfast  senna-docusate  1 tablet Oral BID   sodium chloride  1 g Oral BID WC   thiamine  100 mg Oral Daily   Or   thiamine  100 mg Intravenous Daily   Continuous Infusions:  ampicillin-sulbactam (UNASYN) IV 3 g (09/30/22 1044)   PRN Meds:.alum & mag hydroxide-simeth, HYDROcodone bit-homatropine, ibuprofen, ipratropium-albuterol, lip balm,  menthol-cetylpyridinium, mouth rinse, traMADol  Current Outpatient Medications  Medication Instructions   acetaminophen (TYLENOL) 1,000 mg, Oral, Every 6 hours PRN   amLODipine (NORVASC) 10 mg, Oral, Daily   Ascorbic Acid (VITAMIN C PO) 1 tablet, Oral, Daily   atorvastatin (LIPITOR) 40 mg, Oral, Daily   Erleada 120 mg, Oral, Daily   olmesartan (BENICAR) 40 MG tablet TAKE 1 TABLET(40 MG) BY MOUTH DAILY   spironolactone (ALDACTONE) 12.5 mg, Oral, Daily   VITAMIN D PO 1 capsule, Oral, Daily    Diet Orders (From admission, onward)     Start     Ordered   09/30/22 0746  Diet regular Room service appropriate? Yes; Fluid consistency: Thin; Fluid restriction: 2000 mL Fluid  Diet effective now       Question Answer Comment  Room service appropriate? Yes   Fluid consistency: Thin   Fluid restriction: 2000 mL Fluid      09/30/22 0745            DVT prophylaxis: enoxaparin (LOVENOX) injection 40 mg Start: 09/20/22 1000   Lab Results  Component Value Date   PLT 168 09/30/2022      Code Status: Full Code  Family Communication: no family at bedside   Status is: Inpatient  Remains inpatient appropriate because: severity of illness  Level of care: Progressive  Consultants:  none  Objective: Vitals:   09/29/22 2023 09/29/22 2037 09/30/22 0609 09/30/22 0921  BP:  112/75 135/82   Pulse:  (!) 59 77   Resp:  20 17   Temp:  97.7 F (36.5 C) 98.7 F (37.1 C)   TempSrc:  Oral Oral   SpO2: 95% 93% 92% 93%  Weight:      Height:        Intake/Output Summary (Last 24 hours) at 09/30/2022 1058 Last data filed at 09/30/2022 0900 Gross per 24 hour  Intake 1468.44 ml  Output 1900 ml  Net -431.56 ml    Wt Readings from Last 3 Encounters:  09/22/22 66.7 kg  08/22/22 72.6 kg  07/24/22 74.4 kg    Examination:  Constitutional: NAD Eyes: lids and conjunctivae normal, no scleral icterus ENMT: mmm Neck: normal, supple Respiratory: clear to auscultation bilaterally, no  wheezing, no crackles. Normal respiratory effort.  Cardiovascular: Regular rate and rhythm, no murmurs / rubs / gallops. No LE edema. Abdomen: soft, no distention, no tenderness. Bowel sounds positive.    Data Reviewed: I have independently reviewed following labs and imaging studies   CBC Recent Labs  Lab 09/24/22 0258 09/26/22 0258 09/27/22 0258 09/28/22 0519 09/30/22 0525  WBC 8.5 8.5 10.0 8.1 6.4  HGB 9.9* 9.0* 8.8* 8.7* 9.2*  HCT 27.6* 25.2* 25.5* 24.1* 27.0*  PLT 143* 147* 155 153 168  MCV 109.5* 109.1* 111.8* 109.5* 111.6*  MCH 39.3* 39.0* 38.6* 39.5* 38.0*  MCHC 35.9 35.7 34.5 36.1* 34.1  RDW 11.9 11.9 12.3 12.0 12.1     Recent Labs  Lab 09/24/22 0258 09/25/22 0322 09/25/22 0802 09/26/22 0258 09/27/22 0258 09/28/22 0519 09/29/22 0528 09/30/22 0525  NA 127* 128*  --  128* 129* 128* 132* 135  K 3.4* 3.6  --  3.1* 3.2* 3.4* 3.5 3.6  CL 98 94*  --  96* 97* 94* 95* 96*  CO2 18* 22  --  23 22 24 25 24   GLUCOSE 155* 116*  --  127* 110* 97 106* 93  BUN 21 16  --  16 19 16 17 15   CREATININE 0.65 0.72  --  0.56* 0.64 0.59* 0.67 0.57*  CALCIUM 8.5* 8.9  --  8.7* 8.6* 8.6* 8.7* 8.9  AST  --  916*  --  520* 321* 230*  --  189*  ALT  --  274*  --  223* 198* 176*  --  218*  ALKPHOS  --  103  --  95 94 102  --  89  BILITOT  --  0.8  --  0.7 0.7 0.7  --  0.6  ALBUMIN  --  2.6*  --  2.4* 2.4* 2.2*  --  2.5*  MG 2.2  --   --   --  1.9 2.0  --  2.0  BNP  --   --  108.6*  --   --   --   --   --      ------------------------------------------------------------------------------------------------------------------ No results for input(s): "CHOL", "HDL", "LDLCALC", "TRIG", "CHOLHDL", "LDLDIRECT" in the last 72 hours.  Lab Results  Component Value Date   HGBA1C 5.2 11/30/2021   ------------------------------------------------------------------------------------------------------------------ No results for input(s): "TSH", "T4TOTAL", "T3FREE", "THYROIDAB" in the last 72  hours.  Invalid input(s): "FREET3"  Cardiac Enzymes No results for input(s): "CKMB", "TROPONINI", "MYOGLOBIN" in the last 168 hours.  Invalid input(s): "CK" ------------------------------------------------------------------------------------------------------------------    Component Value Date/Time   BNP 108.6 (H) 09/25/2022 0802    CBG: No results for input(s): "GLUCAP" in the last 168 hours.  Recent Results (from the past 240 hour(s))  Culture, blood (Routine X 2) w Reflex to ID Panel     Status: None   Collection Time: 09/20/22 11:48 AM   Specimen: BLOOD  Result Value Ref Range Status   Specimen Description   Final    BLOOD BLOOD RIGHT ARM AEROBIC BOTTLE ONLY Performed at [facility], [address], [place], [place] [address]    Special Requests   Final    BOTTLES DRAWN AEROBIC ONLY Blood Culture adequate volume Performed at [facility], [address] [place], [place] [address]    Culture   Final    NO GROWTH 5 DAYS Performed at [facility], [address] [place], [place] [address]    Report Status 09/25/2022 FINAL  Final  Culture, blood (Routine X 2) w Reflex to ID Panel     Status: None   Collection Time: 09/20/22 11:48 AM   Specimen: BLOOD  Result Value Ref Range Status   Specimen Description   Final    BLOOD BLOOD RIGHT HAND Performed at [facility], [address], [place], [place] [address]    Special Requests   Final    BOTTLES DRAWN AEROBIC ONLY Blood Culture adequate volume Performed at [facility], [address], [place], [place] [address]    Culture   Final    NO GROWTH 5 DAYS Performed at [facility], [address] [place], [place] [address]    Report Status 09/25/2022 FINAL  Final  MRSA Next Gen by PCR, Nasal     Status: None   Collection Time: 09/22/22 11:10 PM   Specimen: Nasal Mucosa; Nasal Swab  Result Value Ref Range  Status   MRSA by PCR  Next Gen NOT DETECTED NOT DETECTED Final    Comment: (NOTE) The GeneXpert MRSA Assay (FDA approved for NASAL specimens only), is one component of a comprehensive MRSA colonization surveillance program. It is not intended to diagnose MRSA infection nor to guide or monitor treatment for MRSA infections. Test performance is not FDA approved in patients less than 77 years old. Performed at [facility], [address] [place], [place] [address]   SARS Coronavirus 2 by RT PCR (hospital order, performed in [facility] hospital lab) *cepheid single result test* Anterior Nasal Swab     Status: None   Collection Time: 09/23/22  8:00 AM   Specimen: Anterior Nasal Swab  Result Value Ref Range Status   SARS Coronavirus 2 by RT PCR NEGATIVE NEGATIVE Final    Comment: (NOTE) SARS-CoV-2 target nucleic acids are NOT DETECTED.  The SARS-CoV-2 RNA is generally detectable in upper and lower respiratory specimens during the acute phase of infection. The lowest concentration of SARS-CoV-2 viral copies this assay can detect is 250 copies / mL. A negative result does not preclude SARS-CoV-2 infection and should not be used as the sole basis for treatment or other patient management decisions.  A negative result may occur with improper specimen collection / handling, submission of specimen other than nasopharyngeal swab, presence of viral mutation(s) within the areas targeted by this assay, and inadequate number of viral copies (<250 copies / mL). A negative result must be combined with clinical observations, patient history, and epidemiological information.  Fact Sheet for Patients:   RoadLapTop.co.za  Fact Sheet for Healthcare Providers: http://kim-miller.com/  This test is not yet approved or  cleared by the [place] FDA and has been authorized for detection and/or diagnosis of SARS-CoV-2 by FDA under an Emergency Use Authorization  (EUA).  This EUA will remain in effect (meaning this test can be used) for the duration of the COVID-19 declaration under Section 564(b)(1) of the Act, 21 U.S.C. section 360bbb-3(b)(1), unless the authorization is terminated or revoked sooner.  Performed at [facility], [address], [place], [place] [address]   Culture, blood (Routine X 2) w Reflex to ID Panel     Status: None   Collection Time: 09/25/22 11:27 AM   Specimen: BLOOD  Result Value Ref Range Status   Specimen Description   Final    BLOOD BLOOD RIGHT ARM AEROBIC BOTTLE ONLY Performed at [facility], [address], [place], [place] [address]    Special Requests   Final    BOTTLES DRAWN AEROBIC ONLY Blood Culture adequate volume Performed at [facility], [address] [place], [place] [address]    Culture   Final    NO GROWTH 5 DAYS Performed at [facility], [address] [place], [place] [address]    Report Status 09/30/2022 FINAL  Final  Culture, blood (Routine X 2) w Reflex to ID Panel     Status: None   Collection Time: 09/25/22 11:27 AM   Specimen: BLOOD  Result Value Ref Range Status   Specimen Description   Final    BLOOD BLOOD RIGHT HAND AEROBIC BOTTLE ONLY Performed at [facility], [address], [place], [place] [address]    Special Requests   Final    BOTTLES DRAWN AEROBIC ONLY Blood Culture adequate volume Performed at [facility], [address] [place], [place] [address]  Culture   Final    NO GROWTH 5 DAYS Performed at [facility], [address], [place], [place] [address]    Report Status 09/30/2022 FINAL  Final     Radiology Studies: No results found.   Pamella Pert, MD, PhD [facility]  Between 7 am - 7 pm I am available, please contact me via Amion (for emergencies) or Securechat (non urgent messages)  Between 7 pm - 7 am I am not available, please  contact night coverage MD/APP via Amion

## 2022-09-30 NOTE — Progress Notes (Signed)
Occupational Therapy Treatment Patient Details Name: David Townsend David Townsend MRN: [id number] DOB: 07-01-1960 Today's Date: 09/30/2022   History of present illness Pt is 62 yo male presented on 09/19/22 after a fall and generalized weakness.  Pt admitted with sepsis secondary to UTI and rhabdomyolysis. Left ribs 9-11 fractured.  Additionally, pt with ETOH abuse an on CIWA protocol. 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 to SDU 4/20   OT comments  Patient was noted to have made improvements since last OT session towards goals with patient working on one UE support in standing to better be able to participate in hygiene tasks. Patient's discharge plan remains appropriate at this time. OT will continue to follow acutely.     Recommendations for follow up therapy are one component of a multi-disciplinary discharge planning process, led by the attending physician.  Recommendations may be updated based on patient status, additional functional criteria and insurance authorization.    Assistance Recommended at Discharge Frequent or constant Supervision/Assistance  Patient can return home with the following  Assist for transportation;Help with stairs or ramp for entrance;Assistance with cooking/housework;A lot of help with walking and/or transfers;A lot of help with bathing/dressing/bathroom;Direct supervision/assist for medications management   Equipment Recommendations  BSC/3in1;Tub/shower seat       Precautions / Restrictions Precautions Precautions: Fall Precaution Comments: Chronic Foley Restrictions Weight Bearing Restrictions: No              ADL either performed or assessed with clinical judgement   ADL Overall ADL's : Needs assistance/impaired           Toilet Transfer: Minimal assistance;Ambulation;Rolling walker (2 wheels) Toilet Transfer Details  (indicate cue type and reason): with increased time to transfer rom reclienr in room to bathroom and back. patient declined needing to use bathroom at this time. Toileting- Clothing Manipulation and Hygiene: Moderate assistance;Sit to/from stand Toileting - Clothing Manipulation Details (indicate cue type and reason): patient needed encouragement to participate in hygiene tasks himself v.s. therapist doing it for him with increased time and noted flexion in knees and trunk with attempts. patient reported increased back pain with attempt.              Cognition Arousal/Alertness: Awake/alert Behavior During Therapy: WFL for tasks assessed/performed Overall Cognitive Status: Within Functional Limits for tasks assessed       General Comments: needed encouragment to participate in tasks, was noted to be easily frustrated but able to be redirected.        Exercises Other Exercises Other Exercises: patient completed 5 reps x 2 sets of sit to stands from recliner with min cues for proper positioning of hands with two pillows in palce to raise up height of recliner chair.            Pertinent Vitals/ Pain       Pain Assessment Pain Assessment: Faces Faces Pain Scale: Hurts even more Pain Location: back Pain Descriptors / Indicators: Discomfort, Grimacing Pain Intervention(s): Patient requesting pain meds-RN notified, RN gave pain meds during session, Limited activity within patient's tolerance, Monitored during session   Frequency  Min 2X/week        Progress Toward Goals  OT Goals(current goals can now be found in the care plan section)  Progress towards OT goals: Progressing toward goals     Plan Discharge plan remains appropriate       AM-PAC OT "6 Clicks"  Daily Activity     Outcome Measure   Help from another person eating meals?: None Help from another person taking care of personal grooming?: None Help from another person toileting, which includes using toliet,  bedpan, or urinal?: A Lot Help from another person bathing (including washing, rinsing, drying)?: A Lot Help from another person to put on and taking off regular upper body clothing?: A Little Help from another person to put on and taking off regular lower body clothing?: A Lot 6 Click Score: 17    End of Session Equipment Utilized During Treatment: Rolling walker (2 wheels)  OT Visit Diagnosis: Unsteadiness on feet (R26.81);Muscle weakness (generalized) (M62.81);History of falling (Z91.81)   Activity Tolerance Patient tolerated treatment well;Patient limited by pain   Patient Left in chair;with call bell/phone within reach   Nurse Communication Other (comment) (about chair alarm and patient completing pressure relief tasks in recliner.)        Time: 1610-9604 OT Time Calculation (min): 24 min  Charges: OT General Charges $OT Visit: 1 Visit OT Treatments $Self Care/Home Management : 23-37 mins  Rosalio Loud, MS Acute Rehabilitation Department Office# [phone]   Selinda Flavin 09/30/2022, 12:55 PM

## 2022-09-30 NOTE — Progress Notes (Signed)
Inpatient Rehab Admissions Coordinator:    I spoke with Pt. To discuss potential CIR admit He is interested  and states that his girlfriend Gracelyn Nurse can provide 24/7 support at d/c. I will open case with insurance once I get an updated OT note.  Megan Salon, MS, CCC-SLP Rehab Admissions Coordinator  [phone] (celll) [phone] (office)

## 2022-09-30 NOTE — Plan of Care (Signed)
Patient remains in PCU. Chronic foley in place. Fall prevention measures implemented by this RN.   Problem: Education: Goal: Knowledge of General Education information will improve Description: Including pain rating scale, medication(s)/side effects and non-pharmacologic comfort measures Outcome: Progressing   Problem: Health Behavior/Discharge Planning: Goal: Ability to manage health-related needs will improve Outcome: Progressing   Problem: Clinical Measurements: Goal: Ability to maintain clinical measurements within normal limits will improve Outcome: Progressing Goal: Will remain free from infection Outcome: Progressing Goal: Diagnostic test results will improve Outcome: Progressing Goal: Respiratory complications will improve Outcome: Progressing Goal: Cardiovascular complication will be avoided Outcome: Progressing   Problem: Activity: Goal: Risk for activity intolerance will decrease Outcome: Progressing   Problem: Nutrition: Goal: Adequate nutrition will be maintained Outcome: Progressing   Problem: Coping: Goal: Level of anxiety will decrease Outcome: Progressing   Problem: Elimination: Goal: Will not experience complications related to bowel motility Outcome: Progressing Goal: Will not experience complications related to urinary retention Outcome: Progressing   Problem: Pain Managment: Goal: General experience of comfort will improve Outcome: Progressing   Problem: Safety: Goal: Ability to remain free from injury will improve Outcome: Progressing   Problem: Skin Integrity: Goal: Risk for impaired skin integrity will decrease Outcome: Progressing

## 2022-10-01 DIAGNOSIS — A419 Sepsis, unspecified organism: Secondary | ICD-10-CM | POA: Diagnosis not present

## 2022-10-01 DIAGNOSIS — J181 Lobar pneumonia, unspecified organism: Secondary | ICD-10-CM | POA: Diagnosis not present

## 2022-10-01 DIAGNOSIS — J80 Acute respiratory distress syndrome: Secondary | ICD-10-CM | POA: Diagnosis not present

## 2022-10-01 LAB — BASIC METABOLIC PANEL
Anion gap: 12 (ref 5–15)
BUN: 13 mg/dL (ref 8–23)
CO2: 24 mmol/L (ref 22–32)
Calcium: 9 mg/dL (ref 8.9–10.3)
Chloride: 99 mmol/L (ref 98–111)
Creatinine, Ser: 0.62 mg/dL (ref 0.61–1.24)
GFR, Estimated: >60 mL/min (ref >60)
Glucose, Bld: 108 mg/dL — ABNORMAL HIGH (ref 70–99)
Potassium: 3.8 mmol/L (ref 3.5–5.1)
Sodium: 135 mmol/L (ref 135–145)

## 2022-10-01 MED ORDER — TRAMADOL HCL 50 MG PO TABS
50.0000 mg | ORAL_TABLET | Freq: Three times a day (TID) | ORAL | Status: DC | PRN
  Administered 2022-10-01 – 2022-10-02 (×2): 50 mg via ORAL
  Filled 2022-10-01 (×2): qty 1

## 2022-10-01 NOTE — Progress Notes (Signed)
Inpatient Rehab Admissions Coordinator:    Following discussion with PT/OT, it is felt that pt. Is not able to tolerate/participate in the intensity of CIR. I will not pursue for admit. Will need HH vs SNF.   Megan Salon, MS, CCC-SLP Rehab Admissions Coordinator  [phone] (celll) [phone] (office)

## 2022-10-01 NOTE — TOC Progression Note (Addendum)
Transition of Care [facility]) - Progression Note    Patient Details  Name: David Townsend MRN: [id number] Date of Birth: 09/19/1960  [facility]) CM/SW Contact  Tymon Nemetz, Olegario Messier, RN Phone Number: 10/01/2022, 12:54 PM  Clinical Narrative:   Noted AIR not appropriate; await PT to re assess for appropriate level of care. MD updated. -1:20p-awaiting permission to fax out-patient wants to wait until his girlfriend Bria comes back into rm-he is current.y not in agreement for ST SNF-will await to discuss later. MD updated. -2:55p-Faxed out per patient permission;prefers [facility]-await bed offers.    Expected D/C plan-TBD Barriers to Discharge: Continued Medical Work up  Expected Discharge Plan and Services                                               Social Determinants of Health (SDOH) Interventions SDOH Screenings   Food Insecurity: No Food Insecurity (09/21/2022)  Housing: Low Risk  (09/21/2022)  Transportation Needs: No Transportation Needs (09/21/2022)  Utilities: Not At Risk (09/21/2022)  Depression (PHQ2-9): Low Risk  (08/22/2022)  Tobacco Use: High Risk (09/19/2022)    Readmission Risk Interventions     No data to display

## 2022-10-01 NOTE — Progress Notes (Signed)
Physical Therapy Treatment Patient Details Name: David Townsend MRN: [id number] DOB: 16-Nov-1960 Today's Date: 10/01/2022   History of Present Illness Pt is 62 yo male presented on 09/19/22 after a fall and generalized weakness.  Pt admitted with sepsis secondary to UTI and rhabdomyolysis. Left ribs 9-11 fractured.  Additionally, pt with ETOH abuse an on CIWA protocol. 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 to SDU 4/20    PT Comments    Pt seen for PT tx with pt agreeable. Pt demonstrates improvement in strength & activity tolerance as pt is able to ambulate increased distances with CGA fade to supervision with RW on this date. Pt performs BLE strengthening exercises. Pt inquiring about going home vs rehab, stating he thinks his girlfriend can assist him at home but unsure how much. Pt negotiated 4 steps with L rail with mod fade to min assist to practice for home entry. Continue to recommend ongoing PT services to address strengthening, endurance, balance, & independence with all mobility.    Recommendations for follow up therapy are one component of a multi-disciplinary discharge planning process, led by the attending physician.  Recommendations may be updated based on patient status, additional functional criteria and insurance authorization.  Follow Up Recommendations  Can patient physically be transported by private vehicle: Yes    Assistance Recommended at Discharge Intermittent Supervision/Assistance  Patient can return home with the following Assistance with cooking/housework;Direct supervision/assist for financial management;Assist for transportation;Help with stairs or ramp for entrance;A little help with walking and/or transfers;A little help with bathing/dressing/bathroom   Equipment Recommendations  BSC/3in1;Rolling walker (2 wheels)    Recommendations  for Other Services       Precautions / Restrictions Precautions Precautions: Fall Precaution Comments: Chronic Foley Restrictions Weight Bearing Restrictions: No     Mobility  Bed Mobility               General bed mobility comments: not tested, pt received & left sitting in recliner    Transfers Overall transfer level: Needs assistance Equipment used: Rolling walker (2 wheels) Transfers: Sit to/from Stand Sit to Stand: Min guard, Supervision           General transfer comment: CGA fade to supervision for STS from recliner with min cuing to push to standing/reach back for armrests    Ambulation/Gait Ambulation/Gait assistance: Min guard, Supervision Gait Distance (Feet): 55 Feet (+ 65 ft) Assistive device: Rolling walker (2 wheels) Gait Pattern/deviations: Decreased step length - right, Decreased step length - left, Decreased stride length, Decreased dorsiflexion - right, Decreased dorsiflexion - left Gait velocity: decreased     General Gait Details: Education re: need to ambulate within base of AD, upright posture. Pt with decreased heel strike. CGA fade to supervision.   Stairs Stairs: Yes Stairs assistance: Mod assist, Min assist Stair Management: One rail Left, Step to pattern Number of Stairs: 4 General stair comments: mod assist x 1st step, min assist for last 3. PT provides education & demonstration re: step to pattern with BUE on L rail. Pt able to negotiate 4 steps with mod fade to min assist.   Wheelchair Mobility    Modified Rankin (Stroke Patients Only)       Balance Overall balance assessment: Needs assistance, History of Falls   Sitting balance-Leahy Scale: Fair     Standing balance support: During functional activity, Bilateral upper extremity supported, Reliant on assistive  device for balance Standing balance-Leahy Scale: Fair                              Cognition Arousal/Alertness: Awake/alert Behavior During  Therapy: WFL for tasks assessed/performed, Flat affect Overall Cognitive Status: Within Functional Limits for tasks assessed                         Following Commands: Follows one step commands with increased time       General Comments: Voices frustration re: Biomedical scientist Exercises - Lower Extremity Hip Flexion/Marching: AROM, Strengthening, Both, 20 reps, Standing (BUE on RW, 2 sets x 10 reps) Other Exercises Other Exercises: Pt performed STS from recliner without BUE support x 1 rep with mod assist for BLE strengthening, but declined further reps 2/2 increased back pain to 10/10 with 1st attempt.    General Comments        Pertinent Vitals/Pain Pain Assessment Pain Assessment: 0-10 Pain Score: 10-Worst pain ever (0/10 at rest, 10/10 with particular movement) Pain Location: low back Pain Descriptors / Indicators: Discomfort Pain Intervention(s): Monitored during session    Home Living                          Prior Function            PT Goals (current goals can now be found in the care plan section) Acute Rehab PT Goals Patient Stated Goal: regain strength PT Goal Formulation: With patient Time For Goal Achievement: 10/05/22 Potential to Achieve Goals: Good Progress towards PT goals: Progressing toward goals    Frequency    Min 3X/week      PT Plan Equipment recommendations need to be updated    Co-evaluation              AM-PAC PT "6 Clicks" Mobility   Outcome Measure  Help needed turning from your back to your side while in a flat bed without using bedrails?: A Little Help needed moving from lying on your back to sitting on the side of a flat bed without using bedrails?: A Little Help needed moving to and from a bed to a chair (including a wheelchair)?: A Little Help needed standing up from a chair using your arms (e.g., wheelchair or bedside chair)?: A Little Help needed to walk in hospital room?: A  Little Help needed climbing 3-5 steps with a railing? : A Little 6 Click Score: 18    End of Session   Activity Tolerance: Patient tolerated treatment well Patient left: in chair;with chair alarm set;with call bell/phone within reach Nurse Communication: Mobility status PT Visit Diagnosis: Other abnormalities of gait and mobility (R26.89);Muscle weakness (generalized) (M62.81);History of falling (Z91.81)     Time: 1610-9604 PT Time Calculation (min) (ACUTE ONLY): 37 min  Charges:  $Gait Training: 8-22 mins $Therapeutic Activity: 8-22 mins                     Aleda Grana, PT, DPT 10/01/22, 11:07 AM   Sandi Mariscal 10/01/2022, 11:05 AM

## 2022-10-01 NOTE — Progress Notes (Signed)
PROGRESS NOTE  David Townsend JXB:[id number] DOB: [birth year] DOA: 09/19/2022 PCP: Rema Fendt, NP   LOS: 11 days   Brief Narrative / Interim history: 62 y.o. 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 Reports a few days ago he felt generalized weakness and fell in the bathroom. Had to crawl to get back into bed. Mostly has been in bed since. Today slipped out of bed and laid on floor for 3 hours unable to get up. States he has abdominal pain but thinks it is due to straining to try and get up off the floor. In the emergency department, patient was found to have UTI and was started on empiric antibiotic.  His Foley catheter was not exchanged, patient declined. Overnight 4/20, patient became hypoxic.  He required up to 10 L high flow oxygen.  CTA chest was completed which revealed multilobar pneumonia bilaterally.  He was transferred to stepdown unit.  Subjective / 24h Interval events: Feels well.  Still upset about 2 L restriction.  Assesement and Plan: Principal Problem:   Sepsis (HCC) Active Problems:   ETOH abuse   Hyperlipidemia   Prostate cancer metastatic to bone (HCC)   Tobacco abuse   Essential hypertension   Rhabdomyolysis   Hyponatremia   Lobar pneumonia, unspecified organism (HCC)   ARDS (adult respiratory distress syndrome) (HCC)   Principal problem Sepsis secondary to UTI, HCAP -UTI related with chronic indwelling Foley catheter, patient was febrile, tachycardic on admission and met sepsis criteria.  He was COVID-negative, blood cultures are negative to date.  Urine culture showed E. coli and Enterococcus which are pansensitive and has completed the treatment.  -Due to developing respiratory failure and concern for HCAP he was started on cefepime.  Due to persistent fevers ID consulted 4/25.  Eventually his  fever subsided, and he completed an antibiotic course on 4/28.  Afebrile now  Active problems  Acute hypoxemic respiratory failure, concern for ARDS- Overnight 4/20-21, he became hypoxic, saturating 80% on room air.  Required up to 10 L high flow oxygen.  Remains on room air.  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  Sodium has normalized.  Liberalize fluid restriction  Elevated LFTs -Hold Tylenol, Lipitor.  Hepatitis panel negative.  Right upper quadrant ultrasound with concern for cirrhosis.  2D echo unremarkable -LFTs improving, and overall stable   Rhabdomyolysis -Improved with IV fluid   Metastatic prostate cancer -Followed by Dr. Berneice Heinrich, [facility]   Hypertension -continue amlodipine.  Normotensive   Hyperlipidemia -Lipitor -on hold due to elevation in LFTs   Alcohol abuse -CIWA protocol, B1, folic acid   Tobacco abuse -Nicotine patch    Fall with generalized weakness -PT OT, suspect patient may need placement in SNF for discharge  Scheduled Meds:  amLODipine  10 mg Oral Daily   Chlorhexidine Gluconate Cloth  6 each Topical Daily   enoxaparin (LOVENOX) injection  40 mg Subcutaneous Q24H   folic acid  1 mg Oral Daily   lidocaine  1 patch Transdermal Q24H   mometasone-formoterol  2 puff Inhalation BID   multivitamin with minerals  1 tablet Oral Daily   nicotine  21 mg Transdermal Daily   predniSONE  40 mg Oral Q breakfast   senna-docusate  1 tablet Oral BID   sodium chloride  1 g Oral BID WC   thiamine  100 mg Oral Daily   Or   thiamine  100 mg Intravenous Daily   Continuous Infusions:  sodium chloride Stopped (10/01/22 0419)   PRN Meds:.sodium chloride, alum & mag hydroxide-simeth, HYDROcodone bit-homatropine, ibuprofen, ipratropium-albuterol, lip balm, menthol-cetylpyridinium, mouth rinse, traMADol  Current Outpatient Medications  Medication Instructions   acetaminophen  (TYLENOL) 1,000 mg, Oral, Every 6 hours PRN   amLODipine (NORVASC) 10 mg, Oral, Daily   Ascorbic Acid (VITAMIN C PO) 1 tablet, Oral, Daily   atorvastatin (LIPITOR) 40 mg, Oral, Daily   Erleada 120 mg, Oral, Daily   olmesartan (BENICAR) 40 MG tablet TAKE 1 TABLET(40 MG) BY MOUTH DAILY   spironolactone (ALDACTONE) 12.5 mg, Oral, Daily   VITAMIN D PO 1 capsule, Oral, Daily    Diet Orders (From admission, onward)     Start     Ordered   09/30/22 0746  Diet regular Room service appropriate? Yes; Fluid consistency: Thin; Fluid restriction: 2000 mL Fluid  Diet effective now       Question Answer Comment  Room service appropriate? Yes   Fluid consistency: Thin   Fluid restriction: 2000 mL Fluid      09/30/22 0745            DVT prophylaxis: enoxaparin (LOVENOX) injection 40 mg Start: 09/20/22 1000   Lab Results  Component Value Date   PLT 168 09/30/2022      Code Status: Full Code  Family Communication: no family at bedside   Status is: Inpatient  Remains inpatient appropriate because: severity of illness  Level of care: Progressive  Consultants:  none  Objective: Vitals:   09/30/22 1936 09/30/22 1952 10/01/22 0400 10/01/22 0837  BP:  119/70 139/79   Pulse:  65 69   Resp: 18 16 16    Temp:  97.7 F (36.5 C) 98.6 F (37 C)   TempSrc:  Oral Axillary   SpO2:  99% 95% 97%  Weight:      Height:        Intake/Output Summary (Last 24 hours) at 10/01/2022 1218 Last data filed at 10/01/2022 0900 Gross per 24 hour  Intake 1506.31 ml  Output 965 ml  Net 541.31 ml    Wt Readings from Last 3 Encounters:  09/22/22 66.7 kg  08/22/22 72.6 kg  07/24/22 74.4 kg    Examination:  Constitutional: NAD Eyes: lids and conjunctivae normal, no scleral icterus ENMT: mmm Neck: normal, supple Respiratory: clear to auscultation bilaterally, no wheezing, no crackles. Normal respiratory effort.  Cardiovascular: Regular rate and rhythm, no murmurs / rubs / gallops. No LE  edema. Abdomen: soft, no distention, no tenderness. Bowel sounds positive.   Data Reviewed: I have independently reviewed following labs and imaging studies   CBC Recent Labs  Lab 09/26/22 0258 09/27/22 0258 09/28/22 0519 09/30/22 0525  WBC 8.5 10.0 8.1 6.4  HGB 9.0* 8.8* 8.7* 9.2*  HCT 25.2* 25.5* 24.1* 27.0*  PLT 147* 155 153 168  MCV 109.1* 111.8* 109.5* 111.6*  MCH 39.0* 38.6* 39.5* 38.0*  MCHC 35.7 34.5 36.1* 34.1  RDW 11.9 12.3 12.0 12.1     Recent Labs  Lab 09/25/22 0322 09/25/22 0802 09/26/22 0258 09/27/22 0258 09/28/22 0519 09/29/22 0528 09/30/22 0525 10/01/22 0532  NA 128*  --  128* 129* 128* 132* 135 135  K 3.6  --  3.1* 3.2* 3.4* 3.5 3.6 3.8  CL 94*  --  96* 97* 94* 95* 96* 99  CO2 22  --  23 22 24 25 24 24   GLUCOSE 116*  --  127* 110* 97 106* 93 108*  BUN 16  --  16 19 16 17 15 13   CREATININE 0.72  --  0.56* 0.64 0.59* 0.67 0.57* 0.62  CALCIUM 8.9  --  8.7* 8.6* 8.6* 8.7* 8.9 9.0  AST 916*  --  520* 321* 230*  --  189*  --   ALT 274*  --  223* 198* 176*  --  218*  --   ALKPHOS 103  --  95 94 102  --  89  --   BILITOT 0.8  --  0.7 0.7 0.7  --  0.6  --   ALBUMIN 2.6*  --  2.4* 2.4* 2.2*  --  2.5*  --   MG  --   --   --  1.9 2.0  --  2.0  --   BNP  --  108.6*  --   --   --   --   --   --      ------------------------------------------------------------------------------------------------------------------ No results for input(s): "CHOL", "HDL", "LDLCALC", "TRIG", "CHOLHDL", "LDLDIRECT" in the last 72 hours.  Lab Results  Component Value Date   HGBA1C 5.2 11/30/2021   ------------------------------------------------------------------------------------------------------------------ No results for input(s): "TSH", "T4TOTAL", "T3FREE", "THYROIDAB" in the last 72 hours.  Invalid input(s): "FREET3"  Cardiac Enzymes No results for input(s): "CKMB", "TROPONINI", "MYOGLOBIN" in the last 168 hours.  Invalid input(s):  "CK" ------------------------------------------------------------------------------------------------------------------    Component Value Date/Time   BNP 108.6 (H) 09/25/2022 0802    CBG: No results for input(s): "GLUCAP" in the last 168 hours.  Recent Results (from the past 240 hour(s))  MRSA Next Gen by PCR, Nasal     Status: None   Collection Time: 09/22/22 11:10 PM   Specimen: Nasal Mucosa; Nasal Swab  Result Value Ref Range Status   MRSA by PCR Next Gen NOT DETECTED NOT DETECTED Final    Comment: (NOTE) The GeneXpert MRSA Assay (FDA approved for NASAL specimens only), is one component of a comprehensive MRSA colonization surveillance program. It is not intended to diagnose MRSA infection nor to guide or monitor treatment for MRSA infections. Test performance is not FDA approved in patients less than 73 years old. Performed at [facility], [address] [place], [place] [address]   SARS Coronavirus 2 by RT PCR (hospital order, performed in [facility] hospital lab) *cepheid single result test* Anterior Nasal Swab     Status: None   Collection Time: 09/23/22  8:00 AM   Specimen: Anterior Nasal Swab  Result Value Ref Range Status   SARS Coronavirus 2 by RT PCR NEGATIVE NEGATIVE Final    Comment: (NOTE) SARS-CoV-2 target nucleic acids are NOT DETECTED.  The SARS-CoV-2 RNA is generally detectable in upper and lower respiratory specimens during the acute phase of infection. The lowest concentration of SARS-CoV-2 viral copies this assay can detect is 250 copies / mL. A negative result does not preclude SARS-CoV-2 infection and should not be used as the sole basis for treatment or other patient management decisions.  A negative result may occur with improper specimen collection / handling, submission of specimen other than nasopharyngeal swab, presence of viral mutation(s) within the areas targeted by this assay, and inadequate number of viral  copies (<250 copies / mL). A negative result must be combined with clinical observations, patient history, and epidemiological information.  Fact Sheet for Patients:   RoadLapTop.co.za  Fact Sheet for Healthcare Providers: http://kim-miller.com/  This test is not yet approved or  cleared by the [place] and has been authorized for detection and/or diagnosis of SARS-CoV-2 by FDA under an Emergency Use Authorization (EUA).  This EUA will remain in effect (meaning this test can be used) for the duration of the COVID-19 declaration under Section 564(b)(1) of the Act, 21 U.S.C. section 360bbb-3(b)(1), unless the authorization is terminated or revoked sooner.  Performed at [facility], [address], [place], [place] [address]   Culture, blood (Routine X 2) w Reflex to ID Panel     Status: None   Collection Time: 09/25/22 11:27 AM   Specimen: BLOOD  Result Value Ref Range Status   Specimen Description   Final    BLOOD BLOOD RIGHT ARM AEROBIC BOTTLE ONLY Performed at [facility], [address] [place], [place] [address]    Special Requests   Final    BOTTLES DRAWN AEROBIC ONLY Blood Culture adequate volume Performed at [facility], [address] [place], [place] [address]    Culture   Final    NO GROWTH 5 DAYS Performed at [facility], [address], [place], [place] [address]    Report Status 09/30/2022 FINAL  Final  Culture, blood (Routine X 2) w Reflex to ID Panel     Status: None   Collection Time: 09/25/22 11:27 AM   Specimen: BLOOD  Result Value Ref Range Status   Specimen Description   Final    BLOOD BLOOD RIGHT HAND AEROBIC BOTTLE ONLY Performed at [facility], [address], [place], [place] [address]    Special Requests   Final    BOTTLES DRAWN AEROBIC ONLY Blood Culture adequate volume Performed at [facility], [address], [place], [place] [address]    Culture   Final    NO GROWTH 5 DAYS Performed at [facility], [address], [place], [place] [address]    Report Status 09/30/2022 FINAL  Final     Radiology Studies: No results found.   Pamella Pert, MD, PhD [facility]  Between 7 am - 7 pm I am available, please contact me via Amion (for emergencies) or Securechat (non urgent messages)  Between 7 pm - 7 am I am not available, please contact night coverage MD/APP via Amion

## 2022-10-01 NOTE — [place] FL2 (Signed)
[place] MEDICAID FL2 LEVEL OF CARE FORM     IDENTIFICATION  Patient Name: David Townsend Birthdate: 1961/05/04 Sex: male Admission Date (Current Location): 09/19/2022  [facility] and [place] Number:  Producer, television/film/video and Address:  [facility],  [address] [place], [place] [address]      Provider Number: [id number]  Attending Physician Name and Address:  Leatha Gilding, MD  Relative Name and Phone Number:  Reita Cliche Vandermeulen([phone]    Current Level of Care: Hospital Recommended Level of Care: Skilled Nursing Facility Prior Approval Number:    Date Approved/Denied:   PASRR Number: [id number]  Discharge Plan: SNF    Current Diagnoses: Patient Active Problem List   Diagnosis Date Noted   Lobar pneumonia, unspecified organism (HCC) 09/28/2022   ARDS (adult respiratory distress syndrome) (HCC) 09/28/2022   Rhabdomyolysis 09/19/2022   Hyponatremia 09/19/2022   Sepsis (HCC) 09/19/2022   Prostate cancer metastatic to bone (HCC) 04/23/2022   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   Tobacco abuse 04/23/2022   Essential hypertension 04/23/2022   Urinary retention 04/23/2022   Hyperlipidemia 03/27/2022   ETOH abuse 11/01/2015    Orientation RESPIRATION BLADDER Height & Weight     Self, Time, Situation  Normal Continent Weight: 66.7 kg Height:  5\' 9"  (175.3 cm)  BEHAVIORAL SYMPTOMS/MOOD NEUROLOGICAL BOWEL NUTRITION STATUS      Continent Diet (Regular)  AMBULATORY STATUS COMMUNICATION OF NEEDS Skin   Limited Assist Verbally Normal                       Personal Care Assistance Level of Assistance  Bathing, Feeding, Dressing Bathing Assistance: Limited assistance Feeding assistance: Limited assistance Dressing Assistance: Limited assistance     Functional Limitations Info  Sight, Speech, Hearing Sight Info: Adequate Hearing Info: Adequate Speech Info: Adequate    SPECIAL CARE FACTORS FREQUENCY   PT (By licensed PT), OT (By licensed OT)     PT Frequency: 5x week OT Frequency: 5x week            Contractures Contractures Info: Not present    Additional Factors Info  Code Status, Allergies Code Status Info: Full Allergies Info: NKA           Current Medications (10/01/2022):  This is the current hospital active medication list Current Facility-Administered Medications  Medication Dose Route Frequency Provider Last Rate Last Admin   0.9 %  sodium chloride infusion   Intravenous PRN Leatha Gilding, MD   Stopped at 10/01/22 0419   alum & mag hydroxide-simeth (MAALOX/MYLANTA) 200-200-20 MG/5ML suspension 30 mL  30 mL Oral Q4H PRN Noralee Stain, DO   30 mL at 10/01/22 1310   amLODipine (NORVASC) tablet 10 mg  10 mg Oral Daily Tu, Ching T, DO   10 mg at 10/01/22 8469   Chlorhexidine Gluconate Cloth 2 % PADS 6 each  6 each Topical Daily Leatha Gilding, MD   6 each at 10/01/22 1015   enoxaparin (LOVENOX) injection 40 mg  40 mg Subcutaneous Q24H Tu, Ching T, DO   40 mg at 10/01/22 6295   folic acid (FOLVITE) tablet 1 mg  1 mg Oral Daily Tu, Ching T, DO   1 mg at 10/01/22 0903   HYDROcodone bit-homatropine (HYCODAN) 5-1.5 MG/5ML syrup 5 mL  5 mL Oral Q6H PRN Noralee Stain, DO   5 mL at 09/26/22 2228   ibuprofen (ADVIL) tablet 800 mg  800 mg  Oral Q6H PRN Noralee Stain, DO   800 mg at 09/27/22 0752   ipratropium-albuterol (DUONEB) 0.5-2.5 (3) MG/3ML nebulizer solution 3 mL  3 mL Nebulization Q6H PRN Anthoney Harada, NP       lidocaine (LIDODERM) 5 % 1 patch  1 patch Transdermal Q24H Noralee Stain, DO   1 patch at 09/30/22 1458   lip balm (CARMEX) ointment   Topical PRN Noralee Stain, DO       menthol-cetylpyridinium (CEPACOL) lozenge 3 mg  1 lozenge Oral PRN Noralee Stain, DO   3 mg at 09/25/22 2338   mometasone-formoterol (DULERA) 200-5 MCG/ACT inhaler 2 puff  2 puff Inhalation BID Noralee Stain, DO   2 puff at 10/01/22 1610   multivitamin with minerals tablet 1 tablet   1 tablet Oral Daily Tu, Ching T, DO   1 tablet at 10/01/22 9604   nicotine (NICODERM CQ - dosed in mg/24 hours) patch 21 mg  21 mg Transdermal Daily Tu, Ching T, DO   21 mg at 10/01/22 5409   Oral care mouth rinse  15 mL Mouth Rinse PRN Noralee Stain, DO       predniSONE (DELTASONE) tablet 40 mg  40 mg Oral Q breakfast Leatha Gilding, MD   40 mg at 10/01/22 0902   senna-docusate (Senokot-S) tablet 1 tablet  1 tablet Oral BID Leatha Gilding, MD   1 tablet at 09/29/22 0859   sodium chloride tablet 1 g  1 g Oral BID WC Noralee Stain, DO   1 g at 10/01/22 8119   thiamine (VITAMIN B1) tablet 100 mg  100 mg Oral Daily Tu, Ching T, DO   100 mg at 10/01/22 1478   Or   thiamine (VITAMIN B1) injection 100 mg  100 mg Intravenous Daily Tu, Ching T, DO   100 mg at 09/27/22 2956   traMADol (ULTRAM) tablet 50 mg  50 mg Oral Q12H PRN Noralee Stain, DO   50 mg at 09/30/22 1227     Discharge Medications: Please see discharge summary for a list of discharge medications.  Relevant Imaging Results:  Relevant Lab Results:   Additional Information SS#[id number] [phone], [place]legario Mess[place], [place]

## 2022-10-01 NOTE — Plan of Care (Signed)
  Problem: Education: Goal: Knowledge of General Education information will improve Description: Including pain rating scale, medication(s)/side effects and non-pharmacologic comfort measures Outcome: Progressing   Problem: Health Behavior/Discharge Planning: Goal: Ability to manage health-related needs will improve Outcome: Progressing   Problem: Clinical Measurements: Goal: Diagnostic test results will improve Outcome: Progressing Goal: Respiratory complications will improve Outcome: Progressing Goal: Cardiovascular complication will be avoided Outcome: Progressing   Problem: Activity: Goal: Risk for activity intolerance will decrease Outcome: Progressing   Problem: Coping: Goal: Level of anxiety will decrease Outcome: Progressing

## 2022-10-01 NOTE — Progress Notes (Signed)
[facility] PHYSICAL MEDICINE AND REHABILITATION  CONSULT SERVICE NOTE   Chart reviewed. Case discussed with admissions coordinator. 62 yo male with prostate cancer/mets/chronic foley who has become substantially deconditioned due to sepsis as a result of a UTI and multilobar pneumonia. 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 Pt worked with OT yesterday and was min assist with transfer/simple hygiene. Pt transferred with PT sit-std on Saturday with min assist. Unable to ambulate d/t back pain.   Pt was mod I prior to admit with basic mobility and ADL's. It appears that he has friends who can assist him at home.   Mr. Kope is a potential candidate for inpatient rehab. I would like to see his tolerance for mobility improve a bit with therapies on acute. Also, we need to clarify social supports as I would project that he will still need supervision and probably light-physical assistance with at least ADL's at discharge from rehab.   Rehab Admissions Coordinator to follow up   Ranelle Oyster, MD, [facility] Physical Medicine & Rehabilitation Medical Director Rehabilitation Services 10/01/2022

## 2022-10-02 DIAGNOSIS — J80 Acute respiratory distress syndrome: Secondary | ICD-10-CM | POA: Diagnosis not present

## 2022-10-02 DIAGNOSIS — J181 Lobar pneumonia, unspecified organism: Secondary | ICD-10-CM | POA: Diagnosis not present

## 2022-10-02 DIAGNOSIS — A419 Sepsis, unspecified organism: Secondary | ICD-10-CM | POA: Diagnosis not present

## 2022-10-02 LAB — BASIC METABOLIC PANEL
Anion gap: 8 (ref 5–15)
BUN: 17 mg/dL (ref 8–23)
CO2: 24 mmol/L (ref 22–32)
Calcium: 8.9 mg/dL (ref 8.9–10.3)
Chloride: 102 mmol/L (ref 98–111)
Creatinine, Ser: 0.75 mg/dL (ref 0.61–1.24)
GFR, Estimated: >60 mL/min (ref >60)
Glucose, Bld: 104 mg/dL — ABNORMAL HIGH (ref 70–99)
Potassium: 4 mmol/L (ref 3.5–5.1)
Sodium: 134 mmol/L — ABNORMAL LOW (ref 135–145)

## 2022-10-02 MED ORDER — PREDNISONE 20 MG PO TABS
20.0000 mg | ORAL_TABLET | Freq: Every day | ORAL | Status: DC
  Administered 2022-10-03 – 2022-10-04 (×2): 20 mg via ORAL
  Filled 2022-10-02 (×2): qty 1

## 2022-10-02 NOTE — TOC Progression Note (Signed)
Transition of Care [facility]) - Progression Note    Patient Details  Name: David Townsend MRN: [id number] Date of Birth: Nov 14, 1960  Transition of Care [facility]) CM/SW Contact  Lasheka Kempner, Olegario Messier, RN Phone Number: 10/02/2022, 4:11 PM  Clinical Narrative: Annie Sable Rosaland Lao initiating auth-await Berkley Harvey.      Expected Discharge Plan: Skilled Nursing Facility Barriers to Discharge: Insurance Authorization  Expected Discharge Plan and Services                                               Social Determinants of Health (SDOH) Interventions SDOH Screenings   Food Insecurity: No Food Insecurity (09/21/2022)  Housing: Low Risk  (09/21/2022)  Transportation Needs: No Transportation Needs (09/21/2022)  Utilities: Not At Risk (09/21/2022)  Depression (PHQ2-9): Low Risk  (08/22/2022)  Tobacco Use: High Risk (09/19/2022)    Readmission Risk Interventions     No data to display

## 2022-10-02 NOTE — Progress Notes (Signed)
Report received from [facility] RN. Agree with previous assessment. Maintain current plan of Care for this Patient

## 2022-10-02 NOTE — Plan of Care (Signed)
°  Problem: Education: °Goal: Knowledge of General Education information will improve °Description: Including pain rating scale, medication(s)/side effects and non-pharmacologic comfort measures °Outcome: Progressing °  °Problem: Health Behavior/Discharge Planning: °Goal: Ability to manage health-related needs will improve °Outcome: Progressing °  °Problem: Clinical Measurements: °Goal: Will remain free from infection °Outcome: Progressing °Goal: Diagnostic test results will improve °Outcome: Progressing °Goal: Respiratory complications will improve °Outcome: Progressing °Goal: Cardiovascular complication will be avoided °Outcome: Progressing °  °Problem: Activity: °Goal: Risk for activity intolerance will decrease °Outcome: Progressing °  °Problem: Nutrition: °Goal: Adequate nutrition will be maintained °Outcome: Progressing °  °Problem: Coping: °Goal: Level of anxiety will decrease °Outcome: Progressing °  °Problem: Elimination: °Goal: Will not experience complications related to bowel motility °Outcome: Progressing °  °

## 2022-10-02 NOTE — Progress Notes (Signed)
Physical Therapy Treatment Patient Details Name: David Townsend MRN: [id number] DOB: Mar 02, 1961 Today's Date: 10/02/2022   History of Present Illness Pt is 62 yo male presented on 09/19/22 after a fall and generalized weakness.  Pt admitted with sepsis secondary to UTI and rhabdomyolysis. Left ribs 9-11 fractured.  Additionally, pt with ETOH abuse an on CIWA protocol. 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 to SDU 4/20    PT Comments     Pt admitted with above diagnosis.  Pt currently with functional limitations due to the deficits listed below (see PT Problem List). Pt seated in recliner when PT arrived. Pt agreeable to therapy intervention, pt appeared to be agitated bout CLOF vs PLOF and difficult to redirect to engage with therapeutic activity.  Pt S for STS from recliner with min cues. Pt demonstrated improved gait tolerance with 400 feet today one standing rest break at RW with min guard and intermittent close S. Pt participated with seated LE TE and STS from RW. Pt left seated in recliner and all needs met. Pt will benefit from acute skilled PT in current and next venue to increase their independence and safety with mobility.     Recommendations for follow up therapy are one component of a multi-disciplinary discharge planning process, led by the attending physician.  Recommendations may be updated based on patient status, additional functional criteria and insurance authorization.  Follow Up Recommendations  Can patient physically be transported by private vehicle: Yes    Assistance Recommended at Discharge Intermittent Supervision/Assistance  Patient can return home with the following Assistance with cooking/housework;Direct supervision/assist for financial management;Assist for transportation;Help with stairs or ramp for entrance;A little help with walking  and/or transfers;A little help with bathing/dressing/bathroom   Equipment Recommendations  BSC/3in1;Rolling walker (2 wheels)    Recommendations for Other Services       Precautions / Restrictions Precautions Precautions: Fall Precaution Comments: Chronic Foley Restrictions Weight Bearing Restrictions: No     Mobility  Bed Mobility               General bed mobility comments: pt seated in recliner when PT arrived    Transfers Overall transfer level: Needs assistance Equipment used: Rolling walker (2 wheels) Transfers: Sit to/from Stand Sit to Stand: Supervision           General transfer comment: cues for proper UE and AD placement    Ambulation/Gait Ambulation/Gait assistance: Min guard Gait Distance (Feet): 400 Feet Assistive device: Rolling walker (2 wheels) Gait Pattern/deviations: Decreased step length - right, Decreased step length - left, Decreased stride length Gait velocity: decreased     General Gait Details: 400 feet with one standing rest break, cues for posture, proper distance from RW and maitaining proper body position with turns, pt attempted to lift RW from floor with gait training today pt instructed on maintaining RW on floor and need for use secondary to energy conservation and stabiltiy.   Stairs             Wheelchair Mobility    Modified Rankin (Stroke Patients Only)       Balance Overall balance assessment: Needs assistance, History of Falls Sitting-balance support: Feet supported, Bilateral upper extremity supported, No upper extremity supported Sitting balance-Leahy Scale: Fair     Standing balance support: No upper extremity supported (with static standing and reliant on RW for dynamic) Standing balance-Leahy Scale: Fair  Cognition Arousal/Alertness: Awake/alert Behavior During Therapy: WFL for tasks assessed/performed Overall Cognitive Status: Within Functional Limits  for tasks assessed Area of Impairment: Safety/judgement                       Following Commands: Follows multi-step commands consistently Safety/Judgement: Decreased awareness of safety   Problem Solving: Requires verbal cues General Comments: Voices frustration re: CLOF        Exercises General Exercises - Lower Extremity Ankle Circles/Pumps: Seated, AROM, Both, 10 reps Quad Sets:  (BLE elevated on recliner) Long Arc Quad: Seated, AROM, Strengthening, Both, 15 reps Hip ABduction/ADduction: Seated, AROM, Strengthening, Both, 10 reps Hip Flexion/Marching: AROM, Strengthening, Both, 20 reps, Standing (BUE on RW, 2 sets x 10 reps) Other Exercises Other Exercises: STS x 5 with B UE support  and cues for initial standing balance without support at RW    General Comments        Pertinent Vitals/Pain Pain Assessment Pain Assessment: No/denies pain    Home Living Family/patient expects to be discharged to:: Private residence Living Arrangements: Non-relatives/Friends (can provide some physical support) Available Help at Discharge: Available 24 hours/day ("most of the time") Type of Home: House Home Access: Stairs to enter Entrance Stairs-Rails: Doctor, general practice of Steps: 4   Home Layout: One level Home Equipment: Grab bars - tub/shower      Prior Function            PT Goals (current goals can now be found in the care plan section) Acute Rehab PT Goals Patient Stated Goal: regain strength PT Goal Formulation: With patient Time For Goal Achievement: 10/05/22 Potential to Achieve Goals: Good    Frequency    Min 3X/week      PT Plan      Co-evaluation              AM-PAC PT "6 Clicks" Mobility   Outcome Measure  Help needed turning from your back to your side while in a flat bed without using bedrails?: A Little Help needed moving from lying on your back to sitting on the side of a flat bed without using bedrails?: A  Little Help needed moving to and from a bed to a chair (including a wheelchair)?: A Little Help needed standing up from a chair using your arms (e.g., wheelchair or bedside chair)?: A Little Help needed to walk in hospital room?: A Little Help needed climbing 3-5 steps with a railing? : A Little 6 Click Score: 18    End of Session Equipment Utilized During Treatment: Gait belt Activity Tolerance: Patient tolerated treatment well Patient left: in chair;with call bell/phone within reach Nurse Communication: Mobility status PT Visit Diagnosis: Other abnormalities of gait and mobility (R26.89);Muscle weakness (generalized) (M62.81);History of falling (Z91.81)     Time: 8295-6213 PT Time Calculation (min) (ACUTE ONLY): 40 min  Charges:  $Gait Training: 8-22 mins $Therapeutic Exercise: 8-22 mins $Therapeutic Activity: 8-22 mins                      Rica Mote, PT   Jacqualyn Posey 10/02/2022, 12:30 PM

## 2022-10-02 NOTE — Progress Notes (Signed)
PROGRESS NOTE  David Townsend ZOX:[id number] DOB: [birth year] DOA: 09/19/2022 PCP: Rema Fendt, NP   LOS: 12 days   Brief Narrative / Interim history: 62 y.o. 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 Reports a few days ago he felt generalized weakness and fell in the bathroom. Had to crawl to get back into bed. Mostly has been in bed since. Today slipped out of bed and laid on floor for 3 hours unable to get up. States he has abdominal pain but thinks it is due to straining to try and get up off the floor. In the emergency department, patient was found to have UTI and was started on empiric antibiotic.  His Foley catheter was not exchanged, patient declined. Overnight 4/20, patient became hypoxic.  He required up to 10 L high flow oxygen.  CTA chest was completed which revealed multilobar pneumonia bilaterally.  He was transferred to stepdown unit.  Subjective / 24h Interval events: Feels well.  Tells me he prefers [employer].  Really wants the fluid restriction to go away today  Assesement and Plan: Principal Problem:   Sepsis (HCC) Active Problems:   ETOH abuse   Hyperlipidemia   Prostate cancer metastatic to bone (HCC)   Tobacco abuse   Essential hypertension   Rhabdomyolysis   Hyponatremia   Lobar pneumonia, unspecified organism (HCC)   ARDS (adult respiratory distress syndrome) (HCC)   Principal problem Sepsis secondary to UTI, HCAP -UTI related with chronic indwelling Foley catheter, patient was febrile, tachycardic on admission and met sepsis criteria.  He was COVID-negative, blood cultures are negative to date.  Urine culture showed E. coli and Enterococcus which are pansensitive and has completed the treatment.  -Due to developing respiratory failure and concern for HCAP he was started on cefepime.  Due to  persistent fevers ID consulted 4/25.  Eventually his fever subsided, and he completed an antibiotic course on 4/28.  Afebrile now.  Stable for discharge to SNF pending insurance authorization/bed offers  Active problems  Acute hypoxemic respiratory failure, concern for ARDS- Overnight 4/20-21, he became hypoxic, saturating 80% on room air.  Required up to 10 L high flow oxygen.  Remains on room air.  Status post Lasix x 2 over the weekend.  Continue to monitor fluid status.  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  Sodium has normalized but dipping to 134 which is not far from baseline.  Take with fluid restriction today and see what happens  Elevated LFTs -Hold Tylenol, Lipitor.  Hepatitis panel negative.  Right upper quadrant ultrasound with concern for cirrhosis.  2D echo unremarkable -LFTs improving, and overall stable   Rhabdomyolysis -Improved with IV fluid   Metastatic prostate cancer -Followed by Dr. Berneice Heinrich, [facility]   Hypertension -continue amlodipine.  Normotensive.  May need to hold olmesartan and spironolactone upon discharge given normal blood pressure currently on amlodipine alone   Hyperlipidemia -Lipitor -on hold due to elevation in LFTs   Alcohol abuse -CIWA protocol, B1, folic acid   Tobacco abuse -Nicotine patch    Fall with generalized weakness -PT OT, suspect patient may need placement in SNF for discharge  Scheduled Meds:  amLODipine  10 mg Oral Daily   Chlorhexidine Gluconate Cloth  6 each Topical Daily  enoxaparin (LOVENOX) injection  40 mg Subcutaneous Q24H   folic acid  1 mg Oral Daily   lidocaine  1 patch Transdermal Q24H   mometasone-formoterol  2 puff Inhalation BID   multivitamin with minerals  1 tablet Oral Daily   nicotine  21 mg Transdermal Daily   predniSONE  40 mg Oral Q breakfast   senna-docusate  1 tablet Oral BID   sodium chloride  1 g  Oral BID WC   thiamine  100 mg Oral Daily   Or   thiamine  100 mg Intravenous Daily   Continuous Infusions:  sodium chloride Stopped (10/01/22 0419)   PRN Meds:.sodium chloride, alum & mag hydroxide-simeth, HYDROcodone bit-homatropine, ibuprofen, ipratropium-albuterol, lip balm, menthol-cetylpyridinium, mouth rinse, traMADol  Current Outpatient Medications  Medication Instructions   acetaminophen (TYLENOL) 1,000 mg, Oral, Every 6 hours PRN   amLODipine (NORVASC) 10 mg, Oral, Daily   Ascorbic Acid (VITAMIN C PO) 1 tablet, Oral, Daily   atorvastatin (LIPITOR) 40 mg, Oral, Daily   Erleada 120 mg, Oral, Daily   olmesartan (BENICAR) 40 MG tablet TAKE 1 TABLET(40 MG) BY MOUTH DAILY   spironolactone (ALDACTONE) 12.5 mg, Oral, Daily   VITAMIN D PO 1 capsule, Oral, Daily    Diet Orders (From admission, onward)     Start     Ordered   10/02/22 1025  Diet regular Room service appropriate? Yes; Fluid consistency: Thin  Diet effective now       Question Answer Comment  Room service appropriate? Yes   Fluid consistency: Thin      10/02/22 1024            DVT prophylaxis: enoxaparin (LOVENOX) injection 40 mg Start: 09/20/22 1000   Lab Results  Component Value Date   PLT 168 09/30/2022      Code Status: Full Code  Family Communication: no family at bedside   Status is: Inpatient  Remains inpatient appropriate because: severity of illness  Level of care: Progressive  Consultants:  none  Objective: Vitals:   10/01/22 1944 10/01/22 2110 10/02/22 0503 10/02/22 0905  BP:  116/73 116/76   Pulse:  72 61   Resp:  18 19   Temp:  98.5 F (36.9 C) 98.2 F (36.8 C)   TempSrc:  Oral Oral   SpO2: 97% 95% 97% 95%  Weight:      Height:        Intake/Output Summary (Last 24 hours) at 10/02/2022 1141 Last data filed at 10/02/2022 0500 Gross per 24 hour  Intake 360 ml  Output 1800 ml  Net -1440 ml    Wt Readings from Last 3 Encounters:  09/22/22 66.7 kg  08/22/22 72.6  kg  07/24/22 74.4 kg    Examination:  Constitutional: NAD Eyes: lids and conjunctivae normal, no scleral icterus ENMT: mmm Neck: normal, supple Respiratory: clear to auscultation bilaterally, no wheezing, no crackles Cardiovascular: Regular rate and rhythm, no murmurs / rubs / gallops.  Abdomen: soft, no distention, no tenderness. Bowel sounds positive.   Data Reviewed: I have independently reviewed following labs and imaging studies   CBC Recent Labs  Lab 09/26/22 0258 09/27/22 0258 09/28/22 0519 09/30/22 0525  WBC 8.5 10.0 8.1 6.4  HGB 9.0* 8.8* 8.7* 9.2*  HCT 25.2* 25.5* 24.1* 27.0*  PLT 147* 155 153 168  MCV 109.1* 111.8* 109.5* 111.6*  MCH 39.0* 38.6* 39.5* 38.0*  MCHC 35.7 34.5 36.1* 34.1  RDW 11.9 12.3 12.0 12.1     Recent Labs  Lab  09/26/22 0258 09/27/22 0258 09/28/22 0519 09/29/22 0528 09/30/22 0525 10/01/22 0532 10/02/22 0526  NA 128* 129* 128* 132* 135 135 134*  K 3.1* 3.2* 3.4* 3.5 3.6 3.8 4.0  CL 96* 97* 94* 95* 96* 99 102  CO2 23 22 24 25 24 24 24   GLUCOSE 127* 110* 97 106* 93 108* 104*  BUN 16 19 16 17 15 13 17   CREATININE 0.56* 0.64 0.59* 0.67 0.57* 0.62 0.75  CALCIUM 8.7* 8.6* 8.6* 8.7* 8.9 9.0 8.9  AST 520* 321* 230*  --  189*  --   --   ALT 223* 198* 176*  --  218*  --   --   ALKPHOS 95 94 102  --  89  --   --   BILITOT 0.7 0.7 0.7  --  0.6  --   --   ALBUMIN 2.4* 2.4* 2.2*  --  2.5*  --   --   MG  --  1.9 2.0  --  2.0  --   --      ------------------------------------------------------------------------------------------------------------------ No results for input(s): "CHOL", "HDL", "LDLCALC", "TRIG", "CHOLHDL", "LDLDIRECT" in the last 72 hours.  Lab Results  Component Value Date   HGBA1C 5.2 11/30/2021   ------------------------------------------------------------------------------------------------------------------ No results for input(s): "TSH", "T4TOTAL", "T3FREE", "THYROIDAB" in the last 72 hours.  Invalid input(s):  "FREET3"  Cardiac Enzymes No results for input(s): "CKMB", "TROPONINI", "MYOGLOBIN" in the last 168 hours.  Invalid input(s): "CK" ------------------------------------------------------------------------------------------------------------------    Component Value Date/Time   BNP 108.6 (H) 09/25/2022 0802    CBG: No results for input(s): "GLUCAP" in the last 168 hours.  Recent Results (from the past 240 hour(s))  MRSA Next Gen by PCR, Nasal     Status: None   Collection Time: 09/22/22 11:10 PM   Specimen: Nasal Mucosa; Nasal Swab  Result Value Ref Range Status   MRSA by PCR Next Gen NOT DETECTED NOT DETECTED Final    Comment: (NOTE) The GeneXpert MRSA Assay (FDA approved for NASAL specimens only), is one component of a comprehensive MRSA colonization surveillance program. It is not intended to diagnose MRSA infection nor to guide or monitor treatment for MRSA infections. Test performance is not FDA approved in patients less than 109 years old. Performed at [facility], [address] [place], [place] [address]   SARS Coronavirus 2 by RT PCR (hospital order, performed in [facility] hospital lab) *cepheid single result test* Anterior Nasal Swab     Status: None   Collection Time: 09/23/22  8:00 AM   Specimen: Anterior Nasal Swab  Result Value Ref Range Status   SARS Coronavirus 2 by RT PCR NEGATIVE NEGATIVE Final    Comment: (NOTE) SARS-CoV-2 target nucleic acids are NOT DETECTED.  The SARS-CoV-2 RNA is generally detectable in upper and lower respiratory specimens during the acute phase of infection. The lowest concentration of SARS-CoV-2 viral copies this assay can detect is 250 copies / mL. A negative result does not preclude SARS-CoV-2 infection and should not be used as the sole basis for treatment or other patient management decisions.  A negative result may occur with improper specimen collection / handling, submission of specimen other than  nasopharyngeal swab, presence of viral mutation(s) within the areas targeted by this assay, and inadequate number of viral copies (<250 copies / mL). A negative result must be combined with clinical observations, patient history, and epidemiological information.  Fact Sheet for Patients:   RoadLapTop.co.za  Fact Sheet for Healthcare Providers: http://kim-miller.com/  This test is not yet approved or  cleared by the [place] and has been authorized for detection and/or diagnosis of SARS-CoV-2 by FDA under an Emergency Use Authorization (EUA).  This EUA will remain in effect (meaning this test can be used) for the duration of the COVID-19 declaration under Section 564(b)(1) of the Act, 21 U.S.C. section 360bbb-3(b)(1), unless the authorization is terminated or revoked sooner.  Performed at [facility], [address], [place], [place] [address]   Culture, blood (Routine X 2) w Reflex to ID Panel     Status: None   Collection Time: 09/25/22 11:27 AM   Specimen: BLOOD  Result Value Ref Range Status   Specimen Description   Final    BLOOD BLOOD RIGHT ARM AEROBIC BOTTLE ONLY Performed at [facility], [address], [place], [place] [address]    Special Requests   Final    BOTTLES DRAWN AEROBIC ONLY Blood Culture adequate volume Performed at [facility], [address], [place], [place] [address]    Culture   Final    NO GROWTH 5 DAYS Performed at [facility], [address] [place], [place] [address]    Report Status 09/30/2022 FINAL  Final  Culture, blood (Routine X 2) w Reflex to ID Panel     Status: None   Collection Time: 09/25/22 11:27 AM   Specimen: BLOOD  Result Value Ref Range Status   Specimen Description   Final    BLOOD BLOOD RIGHT HAND AEROBIC BOTTLE ONLY Performed at [facility], [address] [place], [place] [address]     Special Requests   Final    BOTTLES DRAWN AEROBIC ONLY Blood Culture adequate volume Performed at [facility], [address] [place], [place] [address]    Culture   Final    NO GROWTH 5 DAYS Performed at [facility], [address], [place], [place] [address]    Report Status 09/30/2022 FINAL  Final     Radiology Studies: No results found.   Pamella Pert, MD, PhD [facility]  Between 7 am - 7 pm I am available, please contact me via Amion (for emergencies) or Securechat (non urgent messages)  Between 7 pm - 7 am I am not available, please contact night coverage MD/APP via Amion

## 2022-10-03 MED ORDER — FOLIC ACID 1 MG PO TABS: 1.0000 mg | ORAL_TABLET | Freq: Every day | ORAL | Status: DC

## 2022-10-03 MED ORDER — NICOTINE 21 MG/24HR TD PT24: 21.0000 mg | MEDICATED_PATCH | Freq: Every day | TRANSDERMAL | 0 refills | Status: DC

## 2022-10-03 MED ORDER — IBUPROFEN 800 MG PO TABS: 800.0000 mg | ORAL_TABLET | Freq: Three times a day (TID) | ORAL | 0 refills | Status: DC | PRN

## 2022-10-03 MED ORDER — LIDOCAINE 5 % EX PTCH: 1.0000 | MEDICATED_PATCH | CUTANEOUS | 0 refills | Status: DC

## 2022-10-03 MED ORDER — SODIUM CHLORIDE 1 G PO TABS: 1.0000 g | ORAL_TABLET | Freq: Two times a day (BID) | ORAL | 0 refills | Status: AC

## 2022-10-03 MED ORDER — ADULT MULTIVITAMIN W/MINERALS CH: 1.0000 | ORAL_TABLET | Freq: Every day | ORAL | Status: DC

## 2022-10-03 MED ORDER — VITAMIN B-1 100 MG PO TABS: 100.0000 mg | ORAL_TABLET | Freq: Every day | ORAL | Status: DC

## 2022-10-03 MED ORDER — SENNOSIDES-DOCUSATE SODIUM 8.6-50 MG PO TABS: 1.0000 | ORAL_TABLET | Freq: Two times a day (BID) | ORAL | Status: DC

## 2022-10-03 NOTE — Plan of Care (Signed)
  Problem: Education: Goal: Knowledge of General Education information will improve Description: Including pain rating scale, medication(s)/side effects and non-pharmacologic comfort measures Outcome: Progressing   Problem: Clinical Measurements: Goal: Ability to maintain clinical measurements within normal limits will improve Outcome: Progressing Goal: Will remain free from infection Outcome: Progressing Goal: Diagnostic test results will improve Outcome: Progressing   Problem: Activity: Goal: Risk for activity intolerance will decrease Outcome: Progressing   Problem: Nutrition: Goal: Adequate nutrition will be maintained Outcome: Progressing   Problem: Elimination: Goal: Will not experience complications related to bowel motility Outcome: Progressing Goal: Will not experience complications related to urinary retention Outcome: Progressing   Problem: Pain Managment: Goal: General experience of comfort will improve Outcome: Progressing

## 2022-10-03 NOTE — TOC Progression Note (Addendum)
Transition of Care [facility]) - Progression Note    Patient Details  Name: David Townsend MRN: [id number] Date of Birth: Feb 16, 1961  [facility]) CM/SW Contact  Yong Grieser, Olegario Messier, RN Phone Number: 10/03/2022, 10:19 AM  Clinical Narrative: [facility] rep [employer] has initiated auth-awaiting auth. Medically stable for d/c.   -1:46p-[address] rep [employer] Cruel still awaiting auth. MD updated.    Expected Discharge Plan: Skilled Nursing Facility Barriers to Discharge: Insurance Authorization  Expected Discharge Plan and Services                                               Social Determinants of Health (SDOH) Interventions SDOH Screenings   Food Insecurity: No Food Insecurity (09/21/2022)  Housing: Low Risk  (09/21/2022)  Transportation Needs: No Transportation Needs (09/21/2022)  Utilities: Not At Risk (09/21/2022)  Depression (PHQ2-9): Low Risk  (08/22/2022)  Tobacco Use: High Risk (09/19/2022)    Readmission Risk Interventions     No data to display

## 2022-10-03 NOTE — Discharge Summary (Addendum)
Physician Discharge Summary  Aly Hauser VHQ:[id number] DOB: [birth year] DOA: 09/19/2022  PCP: Rema Fendt, NP  Admit date: 09/19/2022 Discharge date: 10/04/2022  Admitted From: Home Disposition:  SNF  Discharge Condition:Stable CODE STATUS:FULL Diet recommendation: Heart Healthy  Brief/Interim Summary: 62 y.o. 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 Reports a few days ago he felt generalized weakness and fell in the bathroom. Had to crawl to get back into bed. Mostly has been in bed since. Today slipped out of bed and laid on floor for 3 hours unable to get up. States he has abdominal pain but thinks it is due to straining to try and get up off the floor. In the emergency department, patient was found to have UTI and was started on empiric antibiotic.  His Foley catheter was not exchanged, patient declined. Overnight 4/20, patient became hypoxic.  He required up to 10 L high flow oxygen.  CTA chest was completed which revealed multilobar pneumonia bilaterally.  He was transferred to stepdown unit.  Respiratory status gradually improved with antibiotics.  Currently he is on room air.  PT/OT evaluated him and recommended SNF on discharge.  Medically stable for discharge whenever possible.  Following problems were addressed during the hospitalization:  Sepsis secondary to UTI, HCAP -UTI related with chronic indwelling Foley catheter, patient was febrile, tachycardic on admission and met sepsis criteria.  He was COVID-negative, blood cultures are negative to date.  Urine culture showed E. coli and Enterococcus which are pansensitive and has completed the treatment.  -Due to developing respiratory failure and concern for HCAP he was started on cefepime.  Due to persistent fevers ID consulted 4/25.  Eventually his fever subsided, and  he completed an antibiotic course on 4/28.  Afebrile now.    Acute hypoxemic respiratory failure, concern for ARDS- Overnight 4/20-21, he became hypoxic, saturating 80% on room air.  Required up to 10 L high flow oxygen.  Remains on room air now .  Status post Lasix x 2 over the weekend.     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  Right upper quadrant ultrasound with concern for cirrhosis.  2D echo unremarkable -LFTs improving, and overall stable.Lipitor on hold   Rhabdomyolysis -Improved with IV fluid   Metastatic prostate cancer -Followed by Dr. Berneice Heinrich, [facility].has chronic foley   Hypertension -continue amlodipine.  Normotensive.  We will continue  to hold olmesartan and spironolactone upon discharge given normal blood pressure currently on amlodipine alone   Hyperlipidemia -Lipitor -on hold due to elevation in LFTs   Alcohol abuse -HE was on CIWA protocol.Currently on in withdrawal. Continue  B1, folic acid   Tobacco abuse -Continue nicotine patch    Fall with generalized weakness -PT OT,recommending  SNF for discharge  Discharge Diagnoses:  Principal Problem:   Sepsis (HCC) Active Problems:   ETOH abuse   Hyperlipidemia   Prostate cancer metastatic to bone (HCC)   Tobacco abuse   Essential hypertension   Rhabdomyolysis   Hyponatremia   Lobar pneumonia, unspecified organism (HCC)   ARDS (adult respiratory distress syndrome) (HCC)    Discharge Instructions  Discharge Instructions     Diet - low sodium heart healthy   Complete by: As directed    Discharge instructions   Complete  by: As directed    1)Please take prescribed medications as instructed 2)Do a CBC and CMP tests in a week   Increase activity slowly   Complete by: As directed       Allergies as of 10/04/2022   No Active Allergies      Medication List     STOP taking these medications     atorvastatin 40 MG tablet Commonly known as: LIPITOR   olmesartan 40 MG tablet Commonly known as: BENICAR   spironolactone 25 MG tablet Commonly known as: ALDACTONE       TAKE these medications    acetaminophen 500 MG tablet Commonly known as: TYLENOL Take 1,000 mg by mouth every 6 (six) hours as needed for moderate pain.   amLODipine 10 MG tablet Commonly known as: NORVASC Take 1 tablet (10 mg total) by mouth daily.   Erleada 60 MG tablet Generic drug: apalutamide Take 120 mg by mouth daily.   folic acid 1 MG tablet Commonly known as: FOLVITE Take 1 tablet (1 mg total) by mouth daily.   ibuprofen 800 MG tablet Commonly known as: ADVIL Take 1 tablet (800 mg total) by mouth every 8 (eight) hours as needed for fever, mild pain or headache (alternate with tylenol for fever).   lidocaine 5 % Commonly known as: LIDODERM Place 1 patch onto the skin daily. Remove & Discard patch within 12 hours or as directed by MD   multivitamin with minerals Tabs tablet Take 1 tablet by mouth daily.   nicotine 21 mg/24hr patch Commonly known as: NICODERM CQ - dosed in mg/24 hours Place 1 patch (21 mg total) onto the skin daily.   senna-docusate 8.6-50 MG tablet Commonly known as: Senokot-S Take 1 tablet by mouth 2 (two) times daily.   sodium chloride 1 g tablet Take 1 tablet (1 g total) by mouth 2 (two) times daily with a meal for 7 days.   thiamine 100 MG tablet Commonly known as: Vitamin B-1 Take 1 tablet (100 mg total) by mouth daily.   VITAMIN C PO Take 1 tablet by mouth daily.   VITAMIN D PO Take 1 capsule by mouth daily.        Follow-up Information     Rema Fendt, NP. Schedule an appointment as soon as possible for a visit in 2 week(s).   Specialty: Nurse Practitioner Contact information: [address] [place] [address] [phone]                No Active Allergies  Consultations: None   Procedures/Studies: DG  CHEST PORT 1 VIEW  Result Date: 09/27/2022 CLINICAL DATA:  Fever. EXAM: PORTABLE CHEST 1 VIEW COMPARISON:  October 02, 2022 FINDINGS: Cardiomediastinal silhouette is normal. Mediastinal contours appear intact. There is persistent dense airspace consolidation in the left lower lobe with probable left pleural effusion. Improvement in the aeration of the left upper lung field. Persistent patchy airspace consolidation in the right lung has slightly worsened. Sclerotic lesions in the right clavicle and right glenoid are seen. IMPRESSION: 1. Persistent dense airspace consolidation in the left lower lobe with probable left pleural effusion. 2. Improvement in the aeration of the left upper lung field. 3. Persistent patchy airspace consolidation in the right lung has slightly worsened. Electronically Signed   By: Ted Mcalpine M.D.   On: 09/27/2022 10:17   ECHOCARDIOGRAM COMPLETE  Result Date: 09/26/2022    ECHOCARDIOGRAM REPORT   Patient Name:   David Townsend Date of Exam: 09/26/2022 Medical  Rec #:  [id number]     Height:       69.0 in Accession #:    [id number]    Weight:       147.0 lb Date of Birth:  [birth year]    BSA:          1.813 m Patient Age:    61 years      BP:           160/74 mmHg Patient Gender: M             HR:           96 bpm. Exam Location:  Inpatient Procedure: 2D Echo, Cardiac Doppler, Color Doppler and Intracardiac            Opacification Agent Indications:    Elevated LFT's. Fever  History:        Patient has no prior history of Echocardiogram examinations.                 Risk Factors:Hypertension, Current Smoker and Dyslipidemia.                 ETOH. Metastatic cancer. Rhabdomyolysis.  Sonographer:    Sheralyn Boatman RDCS Referring Phys: [id number] [facility]  Sonographer Comments: Technically difficult study due to poor echo windows. Extremely difficult images. Patient has broken ribs. IMPRESSIONS  1. Left ventricular ejection fraction, by estimation, is 65 to 70%. The left ventricle has  normal function. The left ventricle has no regional wall motion abnormalities. Left ventricular diastolic parameters are indeterminate.  2. Right ventricular systolic function was not well visualized. The right ventricular size is normal. There is normal pulmonary artery systolic pressure.  3. The mitral valve is normal in structure. No evidence of mitral valve regurgitation. No evidence of mitral stenosis.  4. The aortic valve is tricuspid. Aortic valve regurgitation is not visualized. Aortic valve sclerosis/calcification is present, without any evidence of aortic stenosis.  5. The inferior vena cava is normal in size with greater than 50% respiratory variability, suggesting right atrial pressure of 3 mmHg. Conclusion(s)/Recommendation(s): No evidence of valvular vegetations on this transthoracic echocardiogram. Consider a transesophageal echocardiogram to exclude infective endocarditis if clinically indicated. FINDINGS  Left Ventricle: Left ventricular ejection fraction, by estimation, is 65 to 70%. The left ventricle has normal function. The left ventricle has no regional wall motion abnormalities. Definity contrast agent was given IV to delineate the left ventricular  endocardial borders. The left ventricular internal cavity size was normal in size. There is no left ventricular hypertrophy. Left ventricular diastolic parameters are indeterminate. Normal left ventricular filling pressure. Right Ventricle: The right ventricular size is normal. No increase in right ventricular wall thickness. Right ventricular systolic function was not well visualized. There is normal pulmonary artery systolic pressure. The tricuspid regurgitant velocity is  2.39 m/s, and with an assumed right atrial pressure of 3 mmHg, the estimated right ventricular systolic pressure is 25.8 mmHg. Left Atrium: Left atrial size was normal in size. Right Atrium: Right atrial size was normal in size. Pericardium: There is no evidence of pericardial  effusion. Mitral Valve: The mitral valve is normal in structure. No evidence of mitral valve regurgitation. No evidence of mitral valve stenosis. Tricuspid Valve: The tricuspid valve is normal in structure. Tricuspid valve regurgitation is mild . No evidence of tricuspid stenosis. Aortic Valve: The aortic valve is tricuspid. Aortic valve regurgitation is not visualized. Aortic valve sclerosis/calcification is present, without any evidence of aortic stenosis. Pulmonic Valve: The pulmonic  valve was normal in structure. Pulmonic valve regurgitation is not visualized. No evidence of pulmonic stenosis. Aorta: The aortic root is normal in size and structure. Venous: The inferior vena cava is normal in size with greater than 50% respiratory variability, suggesting right atrial pressure of 3 mmHg. IAS/Shunts: No atrial level shunt detected by color flow Doppler.  LEFT VENTRICLE PLAX 2D LVIDd:         4.80 cm     Diastology LVIDs:         3.40 cm     LV e' medial:    6.96 cm/s LV PW:         0.90 cm     LV E/e' medial:  14.9 LV IVS:        1.00 cm     LV e' lateral:   14.70 cm/s LVOT diam:     2.40 cm     LV E/e' lateral: 7.0 LV SV:         73 LV SV Index:   40 LVOT Area:     4.52 cm  LV Volumes (MOD) LV vol d, MOD A2C: 47.2 ml LV vol d, MOD A4C: 47.6 ml LV vol s, MOD A2C: 21.7 ml LV vol s, MOD A4C: 16.8 ml LV SV MOD A2C:     25.5 ml LV SV MOD A4C:     47.6 ml LV SV MOD BP:      29.9 ml RIGHT VENTRICLE             IVC RV S prime:     10.30 cm/s  IVC diam: 1.90 cm TAPSE (M-mode): 2.0 cm LEFT ATRIUM             Index        RIGHT ATRIUM           Index LA diam:        3.50 cm 1.93 cm/m   RA Area:     10.50 cm LA Vol (A2C):   18.8 ml 10.37 ml/m  RA Volume:   20.50 ml  11.31 ml/m LA Vol (A4C):   18.2 ml 10.04 ml/m LA Biplane Vol: 19.7 ml 10.87 ml/m  AORTIC VALVE LVOT Vmax:   98.50 cm/s LVOT Vmean:  63.250 cm/s LVOT VTI:    0.161 m  AORTA Ao Root diam: 3.70 cm Ao Asc diam:  3.40 cm MITRAL VALVE                TRICUSPID  VALVE MV Area (PHT): 4.74 cm     TR Peak grad:   22.8 mmHg MV Decel Time: 160 msec     TR Vmax:        239.00 cm/s MV E velocity: 103.50 cm/s MV A velocity: 100.20 cm/s  SHUNTS MV E/A ratio:  1.03         Systemic VTI:  0.16 m                             Systemic Diam: 2.40 cm Armanda Magic MD Electronically signed by Armanda Magic MD Signature Date/Time: 09/26/2022/10:12:32 AM    Final    US Abdomen Limited RUQ (LIVER/GB)  Result Date: 09/25/2022 CLINICAL DATA:  Elevated liver function tests EXAM: ULTRASOUND ABDOMEN LIMITED RIGHT UPPER QUADRANT COMPARISON:  November 10, 2015 FINDINGS: Gallbladder: Gallstones: None Sludge: Minimal Gallbladder Wall: Within normal limits Pericholecystic fluid: None Sonographic Murphy's Sign: Negative per technologist Common bile duct: Diameter: 2  mm Liver: Parenchymal echogenicity: Within normal limits Contours: Nodular Lesions: None Portal vein: Patent.  Hepatopetal flow Other: None. IMPRESSION: 1. Subtle nodularity of hepatic contours, suspicious for cirrhosis. 2. Minimal gallbladder sludge. Electronically Signed   By: Acquanetta Belling M.D.   On: 09/25/2022 16:12   CT Angio Chest Pulmonary Embolism (PE) W or WO Contrast  Result Date: 09/23/2022 CLINICAL DATA:  63 year old male with respiratory illness. Weakness. Abnormal left lung on portable x-ray yesterday. History of prostate cancer. EXAM: CT ANGIOGRAPHY CHEST WITH CONTRAST TECHNIQUE: Multidetector CT imaging of the chest was performed using the standard protocol during bolus administration of intravenous contrast. Multiplanar CT image reconstructions and MIPs were obtained to evaluate the vascular anatomy. RADIATION DOSE REDUCTION: This exam was performed according to the departmental dose-optimization program which includes automated exposure control, adjustment of the mA and/or kV according to patient size and/or use of iterative reconstruction technique. CONTRAST:  75mL OMNIPAQUE IOHEXOL 350 MG/ML SOLN COMPARISON:  Portable  chest x-ray 09/22/2022. CT Abdomen and Pelvis 07/12/2021. FINDINGS: Cardiovascular: Adequate contrast bolus timing in the pulmonary arterial tree. Respiratory motion. No central or saddle pulmonary artery filling defect. No lobar embolus. No convincing filling defect in the visible segmental and subsegmental pulmonary artery branches. Heart size remains normal. No pericardial effusion. Calcified aortic atherosclerosis. Extensive calcified coronary artery plaque or stent. And also there is a left side SVC anatomic variant. Mediastinum/Nodes: Negative for mediastinal mass. No significant lymphadenopathy. Lungs/Pleura: Small or trace layering left pleural effusion with simple fluid density. Superimposed subtotal left lower lobe consolidation. Subtotal left upper lobe combined ground-glass and early consolidation. And multifocal similar mostly sub solid peribronchial and scattered right lung opacity affecting the upper lobe, medial segment right middle lobe, superior segment right lower lobe. Major airways remain patent. Upper Abdomen: Negative visible mostly noncontrast liver, spleen, pancreas, adrenal glands and kidneys. Negative visible bowel. No free air or free fluid in the upper abdomen. Musculoskeletal: Widely scattered sclerotic bone metastases throughout the visible spine, ribs, sternum, and also the right scapula. Appearance is similar to the CT Abdomen and Pelvis last year. No thoracic vertebral pathologic fracture identified. However, there is a minimally displaced fracture of the left posterolateral 10th rib (series 6, image 141), although this fracture is not in the area of sclerotic metastasis of that rib. And there is also evidence of a subtle acute left 11th rib fracture nearby. Furthermore, subtle left lateral 9th rib fracture on series 6 appears un healed. These appear posttraumatic. Review of the MIP images confirms the above findings. IMPRESSION: 1. Respiratory motion.  No pulmonary embolus  identified. 2. Severe multilobar Pneumonia is bilateral, worse in the left lung. Associated trace layering left pleural effusion. 3. Chronic sclerotic skeletal metastatic disease. But superimposed posttraumatic acute or subacute fractures of the Left ribs 9 through 11. 4. Aortic Atherosclerosis (ICD10-I70.0). Coronary artery atherosclerosis. Electronically Signed   By: Odessa Fleming M.D.   On: 09/23/2022 04:44   DG Chest Port 1 View  Result Date: 09/22/2022 CLINICAL DATA:  Respiratory illness. EXAM: PORTABLE CHEST 1 VIEW COMPARISON:  Chest radiograph dated 09/19/2022. FINDINGS: Diffuse airspace density throughout the left lung most consistent with developing infiltrate. Asymmetric edema is less likely. Clinical correlation is recommended. The right lung is clear. A small left pleural effusion suspected. No pneumothorax. The cardiac silhouette is within normal limits. No acute osseous pathology. IMPRESSION: Findings most consistent with developing infiltrate throughout the left lung. Electronically Signed   By: Elgie Collard M.D.   On: 09/22/2022 22:32  MR LUMBAR SPINE WO CONTRAST  Result Date: 09/19/2022 CLINICAL DATA:  Bilateral lower extremity weakness, right greater than left getting treatment for prostate cancer. EXAM: MRI THORACIC AND LUMBAR SPINE WITHOUT CONTRAST TECHNIQUE: Multiplanar and multiecho pulse sequences of the thoracic and lumbar spine were obtained without intravenous contrast. COMPARISON:  NM bone scan 07/23/2021, CT 07/12/2021. FINDINGS: MRI THORACIC SPINE FINDINGS Alignment:  Physiologic. Vertebrae: There is diffuse osseous metastatic disease throughout the thoracic spine, and also notable in the sternum. There are areas of marrow edema signal within multiple vertebral bodies but no evidence of acute compression fracture. No evidence of discitis. Cord: No abnormal cord signal. Paraspinal and other soft tissues: Left posterior medial lung signal abnormality/consolidation. Disc levels:  Evaluation of the disc levels are demonstrates no large disc herniation, significant spinal canal or neural foraminal narrowing. MRI LUMBAR SPINE FINDINGS Segmentation:  Standard. Alignment:  Physiologic. Vertebrae: Diffuse osseous metastatic disease throughout the lumbar spine and partially visualized sacrum. Areas of marrow edema signal but no evidence of acute compression fracture. Conus medullaris and cauda equina: Conus extends to the L1 level. Conus and cauda equina appear normal. Paraspinal and other soft tissues: Negative. Disc levels: T12-L1: No significant spinal canal or neural foraminal narrowing. L1-L2: A minimal asymmetric right disc bulging. No significant spinal canal or neural foraminal stenosis. L2-L3: Minimal disc bulging. No significant spinal canal or neural foraminal stenosis. L3-L4: Mild disc bulging, ligament flavum hypertrophy and bilateral facet arthropathy. Mild bilateral subarticular narrowing and mild right-sided neural foraminal narrowing. L4-L5: Broad disc bulging, ligament flavum hypertrophy and bilateral facet arthropathy. There is moderate spinal canal stenosis and moderate bilateral neural foraminal stenosis. L5-S1: Broad disc bulging, ligament flavum hypertrophy and bilateral facet arthropathy. There is moderate bilateral neural foraminal stenosis. No spinal canal stenosis. Small perineural cyst on the right at S2. IMPRESSION: Diffuse osseous metastatic disease throughout the thoracic spine, lumbar spine, and in the partially visualized sacrum. No evidence of cord compression or abnormal cord signal. No evidence of acute fracture. Multilevel degenerative changes of the lumbar spine. Moderate spinal canal stenosis at L4-L5. Moderate bilateral neural foraminal stenosis at L4-L5 and L5-S1. Left posterior medial lung signal abnormality/consolidation, could reflect pneumonia. Recommend correlation with chest CT. Electronically Signed   By: Caprice Renshaw M.D.   On: 09/19/2022 18:55   MR  THORACIC SPINE WO CONTRAST  Result Date: 09/19/2022 CLINICAL DATA:  Bilateral lower extremity weakness, right greater than left getting treatment for prostate cancer. EXAM: MRI THORACIC AND LUMBAR SPINE WITHOUT CONTRAST TECHNIQUE: Multiplanar and multiecho pulse sequences of the thoracic and lumbar spine were obtained without intravenous contrast. COMPARISON:  NM bone scan 07/23/2021, CT 07/12/2021. FINDINGS: MRI THORACIC SPINE FINDINGS Alignment:  Physiologic. Vertebrae: There is diffuse osseous metastatic disease throughout the thoracic spine, and also notable in the sternum. There are areas of marrow edema signal within multiple vertebral bodies but no evidence of acute compression fracture. No evidence of discitis. Cord: No abnormal cord signal. Paraspinal and other soft tissues: Left posterior medial lung signal abnormality/consolidation. Disc levels: Evaluation of the disc levels are demonstrates no large disc herniation, significant spinal canal or neural foraminal narrowing. MRI LUMBAR SPINE FINDINGS Segmentation:  Standard. Alignment:  Physiologic. Vertebrae: Diffuse osseous metastatic disease throughout the lumbar spine and partially visualized sacrum. Areas of marrow edema signal but no evidence of acute compression fracture. Conus medullaris and cauda equina: Conus extends to the L1 level. Conus and cauda equina appear normal. Paraspinal and other soft tissues: Negative. Disc levels: T12-L1: No significant  spinal canal or neural foraminal narrowing. L1-L2: A minimal asymmetric right disc bulging. No significant spinal canal or neural foraminal stenosis. L2-L3: Minimal disc bulging. No significant spinal canal or neural foraminal stenosis. L3-L4: Mild disc bulging, ligament flavum hypertrophy and bilateral facet arthropathy. Mild bilateral subarticular narrowing and mild right-sided neural foraminal narrowing. L4-L5: Broad disc bulging, ligament flavum hypertrophy and bilateral facet arthropathy. There  is moderate spinal canal stenosis and moderate bilateral neural foraminal stenosis. L5-S1: Broad disc bulging, ligament flavum hypertrophy and bilateral facet arthropathy. There is moderate bilateral neural foraminal stenosis. No spinal canal stenosis. Small perineural cyst on the right at S2. IMPRESSION: Diffuse osseous metastatic disease throughout the thoracic spine, lumbar spine, and in the partially visualized sacrum. No evidence of cord compression or abnormal cord signal. No evidence of acute fracture. Multilevel degenerative changes of the lumbar spine. Moderate spinal canal stenosis at L4-L5. Moderate bilateral neural foraminal stenosis at L4-L5 and L5-S1. Left posterior medial lung signal abnormality/consolidation, could reflect pneumonia. Recommend correlation with chest CT. Electronically Signed   By: Caprice Renshaw M.D.   On: 09/19/2022 18:55   CT Head Wo Contrast  Result Date: 09/19/2022 CLINICAL DATA:  Altered mental status. EXAM: CT HEAD WITHOUT CONTRAST TECHNIQUE: Contiguous axial images were obtained from the base of the skull through the vertex without intravenous contrast. RADIATION DOSE REDUCTION: This exam was performed according to the departmental dose-optimization program which includes automated exposure control, adjustment of the mA and/or kV according to patient size and/or use of iterative reconstruction technique. COMPARISON:  None Available. FINDINGS: Brain: Mild age-related atrophy and chronic microvascular ischemic changes. There is no acute intracranial hemorrhage. No mass effect or midline shift. No extra-axial fluid collection. Vascular: No hyperdense vessel or unexpected calcification. Skull: No acute calvarial pathology. Sclerotic osseous metastatic disease in keeping with known metastatic prostate cancer. Sinuses/Orbits: Mild mucoperiosteal thickening of paranasal sinuses. The mastoid air cells are clear. Other: None IMPRESSION: 1. No acute intracranial pathology. 2. Mild  age-related atrophy and chronic microvascular ischemic changes. 3. 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 Electronically Signed   By: Elgie Collard M.D.   On: 09/19/2022 18:39   DG Chest Portable 1 View  Result Date: 09/19/2022 CLINICAL DATA:  cough, weakness EXAM: PORTABLE CHEST - 1 VIEW COMPARISON:  04/29/2018 FINDINGS: Cardiac silhouette is unremarkable. No pneumothorax or pleural effusion. The lungs are clear. The visualized skeletal structures are unremarkable. IMPRESSION: No acute cardiopulmonary process. Electronically Signed   By: Layla Maw M.D.   On: 09/19/2022 12:35      Subjective: Patient seen and examined at bedside today.  Very comfortable.  Sitting on the chair.  He walked with mobility tech today.  Very anxious to leave to SNF.  Denies any new complaints  Discharge Exam: Vitals:   10/03/22 1955 10/04/22 0640  BP: 106/64 115/74  Pulse: 73 82  Resp: 15 20  Temp: 98 F (36.7 C) 98.1 F (36.7 C)  SpO2: 98% 98%   Vitals:   10/03/22 1900 10/03/22 1951 10/03/22 1955 10/04/22 0640  BP:   106/64 115/74  Pulse:   73 82  Resp:   15 20  Temp:   98 F (36.7 C) 98.1 F (36.7 C)  TempSrc:      SpO2:  96% 98% 98%  Weight: 63 kg     Height:        General: Pt is alert, awake, not in acute distress Cardiovascular: RRR, S1/S2 +, no rubs, no gallops Respiratory: CTA bilaterally, no wheezing, no  rhonchi Abdominal: Soft, NT, ND, bowel sounds + Extremities: no edema, no cyanosis GU: foley    The results of significant diagnostics from this hospitalization (including imaging, microbiology, ancillary and laboratory) are listed below for reference.     Microbiology: Recent Results (from the past 240 hour(s))  Culture, blood (Routine X 2) w Reflex to ID Panel     Status: None   Collection Time: 09/25/22 11:27 AM   Specimen: BLOOD  Result Value Ref Range Status   Specimen Description   Final    BLOOD BLOOD RIGHT ARM AEROBIC BOTTLE ONLY Performed at [facility], [address] [place], [place] [address]    Special Requests   Final    BOTTLES DRAWN AEROBIC ONLY Blood Culture adequate volume Performed at [facility], [address], [place], [place] [address]    Culture   Final    NO GROWTH 5 DAYS Performed at [facility], [address], [place], [place] [address]    Report Status 09/30/2022 FINAL  Final  Culture, blood (Routine X 2) w Reflex to ID Panel     Status: None   Collection Time: 09/25/22 11:27 AM   Specimen: BLOOD  Result Value Ref Range Status   Specimen Description   Final    BLOOD BLOOD RIGHT HAND AEROBIC BOTTLE ONLY Performed at [facility] [address], [place], [place] [address]    Special Requests   Final    BOTTLES DRAWN AEROBIC ONLY Blood Culture adequate volume Performed at [facility], [address] [place], [place] [address]    Culture   Final    NO GROWTH 5 DAYS Performed at [facility], [address] [place], [place] [address]    Report Status 09/30/2022 FINAL  Final     Labs: BNP (last 3 results) Recent Labs    09/22/22 2335 09/25/22 0802  BNP 275.5* 108.6*   Basic Metabolic Panel: Recent Labs  Lab 09/28/22 0519 09/29/22 0528 09/30/22 0525 10/01/22 0532 10/02/22 0526 10/04/22 0538  NA 128* 132* 135 135 134* 135  K 3.4* 3.5 3.6 3.8 4.0 3.8  CL 94* 95* 96* 99 102 101  CO2 24 25 24 24 24 23   GLUCOSE 97 106* 93 108* 104* 101*  BUN 16 17 15 13 17 16   CREATININE 0.59* 0.67 0.57* 0.62 0.75 0.60*  CALCIUM 8.6* 8.7* 8.9 9.0 8.9 9.1  MG 2.0  --  2.0  --   --   --    Liver Function Tests: Recent Labs  Lab 09/28/22 0519 09/30/22 0525 10/04/22 0538  AST 230* 189* 53*  ALT 176* 218* 118*  ALKPHOS 102 89 67  BILITOT 0.7 0.6 0.5  PROT 5.8* 6.1* 6.0*  ALBUMIN 2.2* 2.5* 2.9*   No results for input(s): "LIPASE", "AMYLASE" in the last 168 hours. No results for input(s): "AMMONIA" in the last 168  hours. CBC: Recent Labs  Lab 09/28/22 0519 09/30/22 0525  WBC 8.1 6.4  HGB 8.7* 9.2*  HCT 24.1* 27.0*  MCV 109.5* 111.6*  PLT 153 168   Cardiac Enzymes: No results for input(s): "CKTOTAL", "CKMB", "CKMBINDEX", "TROPONINI" in the last 168 hours. BNP: Invalid input(s): "POCBNP" CBG: No results for input(s): "GLUCAP" in the last 168 hours. D-Dimer No results for input(s): "DDIMER" in the last 72 hours. Hgb A1c No results for input(s): "HGBA1C" in the last 72 hours. Lipid Profile No results for input(s): "CHOL", "HDL", "LDLCALC", "TRIG", "  CHOLHDL", "LDLDIRECT" in the last 72 hours. Thyroid function studies No results for input(s): "TSH", "T4TOTAL", "T3FREE", "THYROIDAB" in the last 72 hours.  Invalid input(s): "FREET3" Anemia work up No results for input(s): "VITAMINB12", "FOLATE", "FERRITIN", "TIBC", "IRON", "RETICCTPCT" in the last 72 hours. Urinalysis    Component Value Date/Time   COLORURINE YELLOW 09/19/2022 1150   APPEARANCEUR HAZY (A) 09/19/2022 1150   LABSPEC 1.019 09/19/2022 1150   PHURINE 5.0 09/19/2022 1150   GLUCOSEU NEGATIVE 09/19/2022 1150   HGBUR MODERATE (A) 09/19/2022 1150   BILIRUBINUR NEGATIVE 09/19/2022 1150   BILIRUBINUR negative 08/12/2019 1130   KETONESUR 20 (A) 09/19/2022 1150   PROTEINUR 100 (A) 09/19/2022 1150   UROBILINOGEN 0.2 08/12/2019 1130   NITRITE NEGATIVE 09/19/2022 1150   LEUKOCYTESUR SMALL (A) 09/19/2022 1150   Sepsis Labs Recent Labs  Lab 09/28/22 0519 09/30/22 0525  WBC 8.1 6.4   Microbiology Recent Results (from the past 240 hour(s))  Culture, blood (Routine X 2) w Reflex to ID Panel     Status: None   Collection Time: 09/25/22 11:27 AM   Specimen: BLOOD  Result Value Ref Range Status   Specimen Description   Final    BLOOD BLOOD RIGHT ARM AEROBIC BOTTLE ONLY Performed at [facility], [address], [place], [place] [address]    Special Requests   Final    BOTTLES DRAWN AEROBIC ONLY Blood Culture  adequate volume Performed at [facility], [address], [place], [place] [address]    Culture   Final    NO GROWTH 5 DAYS Performed at [facility], [address], [place], [place] [address]    Report Status 09/30/2022 FINAL  Final  Culture, blood (Routine X 2) w Reflex to ID Panel     Status: None   Collection Time: 09/25/22 11:27 AM   Specimen: BLOOD  Result Value Ref Range Status   Specimen Description   Final    BLOOD BLOOD RIGHT HAND AEROBIC BOTTLE ONLY Performed at [facility], [address], [place], [place] [address]    Special Requests   Final    BOTTLES DRAWN AEROBIC ONLY Blood Culture adequate volume Performed at [facility], [address] [place], [place] [address]    Culture   Final    NO GROWTH 5 DAYS Performed at [facility], [address] [place], [place] [address]    Report Status 09/30/2022 FINAL  Final    Please note: You were cared for by a hospitalist during your hospital stay. Once you are discharged, your primary care physician will handle any further medical issues. Please note that NO REFILLS for any discharge medications will be authorized once you are discharged, as it is imperative that you return to your primary care physician (or establish a relationship with a primary care physician if you do not have one) for your post hospital discharge needs so that they can reassess your need for medications and monitor your lab values.    Time coordinating discharge: 40 minutes  SIGNED:   Burnadette Pop, MD  [facility] 10/04/2022, 7:42 AM Pager [phone]  If 7PM-7AM, please contact night-coverage [email or web address] Password [email or web address]

## 2022-10-03 NOTE — Plan of Care (Signed)

## 2022-10-03 NOTE — Progress Notes (Addendum)
Mobility Specialist - Progress Note   10/03/22 1339  Mobility  Activity Ambulated with assistance in hallway  Level of Assistance Standby assist, set-up cues, supervision of patient - no hands on  Assistive Device Front wheel walker  Distance Ambulated (ft) 500 ft  Activity Response Tolerated well  Mobility Referral Yes  $Mobility charge 1 Mobility   Pt received in recliner and agreeable to mobility. When returning to room MD allowed pt to be wheeled outside for . No complaints during session.  Pt to recliner after session with all needs met.    [facility]

## 2022-10-04 LAB — COMPREHENSIVE METABOLIC PANEL
ALT: 118 U/L — ABNORMAL HIGH (ref 0–44)
AST: 53 U/L — ABNORMAL HIGH (ref 15–41)
Albumin: 2.9 g/dL — ABNORMAL LOW (ref 3.5–5.0)
Alkaline Phosphatase: 67 U/L (ref 38–126)
Anion gap: 11 (ref 5–15)
BUN: 16 mg/dL (ref 8–23)
CO2: 23 mmol/L (ref 22–32)
Calcium: 9.1 mg/dL (ref 8.9–10.3)
Chloride: 101 mmol/L (ref 98–111)
Creatinine, Ser: 0.6 mg/dL — ABNORMAL LOW (ref 0.61–1.24)
GFR, Estimated: >60 mL/min (ref >60)
Glucose, Bld: 101 mg/dL — ABNORMAL HIGH (ref 70–99)
Potassium: 3.8 mmol/L (ref 3.5–5.1)
Sodium: 135 mmol/L (ref 135–145)
Total Bilirubin: 0.5 mg/dL (ref 0.3–1.2)
Total Protein: 6 g/dL — ABNORMAL LOW (ref 6.5–8.1)

## 2022-10-04 NOTE — Progress Notes (Signed)
Report called to [facility]. Chanise Habeck, Yancey Flemings, RN

## 2022-10-04 NOTE — Progress Notes (Signed)
Patient seen and examined at bedside today.  Hemodynamically stable.  No new complaints.  No new change in the medical management.  Medically stable for discharge.  Discharge summary and orders are already in.

## 2022-10-04 NOTE — Plan of Care (Signed)
  Problem: Education: Goal: Knowledge of General Education information will improve Description: Including pain rating scale, medication(s)/side effects and non-pharmacologic comfort measures Outcome: Completed/Met   Problem: Activity: Goal: Risk for activity intolerance will decrease Outcome: Completed/Met   Problem: Nutrition: Goal: Adequate nutrition will be maintained Outcome: Progressing

## 2022-10-04 NOTE — TOC Transition Note (Signed)
Transition of Care [facility]) - CM/SW Discharge Note   Patient Details  Name: David Townsend MRN: [id number] Date of Birth: 1961/03/30  Transition of Care [facility]) CM/SW Contact:  Lanier Clam, RN Phone Number: 10/04/2022, 3:22 PM   Clinical Narrative: [employer] rep Lawerance Cruel received auth-going to [id number],report tel#[phone]. PTAR called No further CM needs.      Final next level of care: Skilled Nursing Facility Barriers to Discharge: No Barriers Identified   Patient Goals and CMS Choice      Discharge Placement                         Discharge Plan and Services Additional resources added to the After Visit Summary for                                       Social Determinants of Health (SDOH) Interventions SDOH Screenings   Food Insecurity: No Food Insecurity (09/21/2022)  Housing: Low Risk  (09/21/2022)  Transportation Needs: No Transportation Needs (09/21/2022)  Utilities: Not At Risk (09/21/2022)  Depression (PHQ2-9): Low Risk  (08/22/2022)  Tobacco Use: High Risk (09/19/2022)     Readmission Risk Interventions     No data to display

## 2022-10-04 NOTE — Plan of Care (Signed)
  Problem: Education: Goal: Knowledge of General Education information will improve Description: Including pain rating scale, medication(s)/side effects and non-pharmacologic comfort measures Outcome: Completed/Met   

## 2022-10-04 NOTE — Progress Notes (Signed)
Physical Therapy Treatment Patient Details Name: David Townsend MRN: [id number] DOB: 05-Feb-1961 Today's Date: 10/04/2022   History of Present Illness Pt is 62 yo male presented on 09/19/22 after a fall and generalized weakness.  Pt admitted with sepsis secondary to UTI and rhabdomyolysis. Left ribs 9-11 fractured.  Additionally, pt with ETOH abuse an on CIWA protocol. 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 to SDU 4/20    PT Comments    Pt amb with RW, good steadiness supv neat modified ind. Trialed no AD, pt slightly unsteady without overt LOB, able to amb straightline gait ~40 ft with min guard. Cued pt on widening BOS to improve steadiness. Pt with lunch arrived in room when returned, all needs in reach.   Recommendations for follow up therapy are one component of a multi-disciplinary discharge planning process, led by the attending physician.  Recommendations may be updated based on patient status, additional functional criteria and insurance authorization.  Follow Up Recommendations  Can patient physically be transported by private vehicle: Yes    Assistance Recommended at Discharge Intermittent Supervision/Assistance  Patient can return home with the following Assistance with cooking/housework;Direct supervision/assist for financial management;Assist for transportation;Help with stairs or ramp for entrance;A little help with walking and/or transfers;A little help with bathing/dressing/bathroom   Equipment Recommendations  BSC/3in1;Rolling walker (2 wheels)    Recommendations for Other Services       Precautions / Restrictions Precautions Precautions: Fall Precaution Comments: Chronic Foley Restrictions Weight Bearing Restrictions: No     Mobility  Bed Mobility  General bed mobility comments: in recliner    Transfers Overall transfer level:  Needs assistance Equipment used: Rolling walker (2 wheels) Transfers: Sit to/from Stand Sit to Stand: Supervision  General transfer comment: good BUE placement, therapist managing foley catheter    Ambulation/Gait Ambulation/Gait assistance: Supervision, Min guard Gait Distance (Feet): 400 Feet Assistive device: Rolling walker (2 wheels), None Gait Pattern/deviations: Step-through pattern, Decreased stride length Gait velocity: decreased  General Gait Details: pt amb with RW, good step through gait pattern, good steadiness, light UE assist; trialed amb without AD for ~40 ft, needing min guard for safety, narrow BOS with some unsteadiness R/L equally without overt LOB   Stairs             Wheelchair Mobility    Modified Rankin (Stroke Patients Only)       Balance Overall balance assessment: Needs assistance, History of Falls Sitting-balance support: Feet supported, Bilateral upper extremity supported, No upper extremity supported Sitting balance-Leahy Scale: Fair  Standing balance support: During functional activity Standing balance-Leahy Scale: Fair Standing balance comment: min guard without AD, supv wtih RW    Cognition Arousal/Alertness: Awake/alert Behavior During Therapy: WFL for tasks assessed/performed Overall Cognitive Status: Within Functional Limits for tasks assessed  General Comments: voicing frustrations regarding d/c, hospital food, wanting to play golf        Exercises      General Comments        Pertinent Vitals/Pain Pain Assessment Pain Assessment: No/denies pain    Home Living                          Prior Function            PT Goals (current goals can now be found in the care plan section) Acute Rehab PT Goals Patient Stated Goal:  regain strength PT Goal Formulation: With patient Time For Goal Achievement: 10/05/22 Potential to Achieve Goals: Good Progress towards PT goals: Progressing toward goals     Frequency    Min 1X/week      PT Plan Current plan remains appropriate    Co-evaluation              AM-PAC PT "6 Clicks" Mobility   Outcome Measure  Help needed turning from your back to your side while in a flat bed without using bedrails?: A Little Help needed moving from lying on your back to sitting on the side of a flat bed without using bedrails?: A Little Help needed moving to and from a bed to a chair (including a wheelchair)?: A Little Help needed standing up from a chair using your arms (e.g., wheelchair or bedside chair)?: A Little Help needed to walk in hospital room?: A Little Help needed climbing 3-5 steps with a railing? : A Little 6 Click Score: 18    End of Session Equipment Utilized During Treatment: Gait belt Activity Tolerance: Patient tolerated treatment well Patient left: in chair;with call bell/phone within reach;with chair alarm set Nurse Communication: Mobility status PT Visit Diagnosis: Other abnormalities of gait and mobility (R26.89);Muscle weakness (generalized) (M62.81);History of falling (Z91.81)     Time: 1610-9604 PT Time Calculation (min) (ACUTE ONLY): 13 min  Charges:  $Gait Training: 8-22 mins                     Tori Farron Lafond PT, DPT 10/04/22, 12:28 PM

## 2022-10-04 NOTE — Progress Notes (Signed)
Mobility Specialist - Progress Note   10/04/22 1112  Mobility  Activity Off unit (Wheeled Outside)  Level of Assistance Standby assist, set-up cues, supervision of patient - no hands on  Assistive Device Wheelchair  Distance Ambulated (ft) 5 ft  Activity Response Tolerated well  Mobility Referral Yes  $Mobility charge 1 Mobility   Pt received in recliner wanting to go outside. Pt was able to ambulate to wheelchair. Pt tolerated sitting outside for . No complaints during session. When returning to unit, pt ambulated without any AD back to recliner. Pt to recliner after session with all needs met & call bell in reach.  [facility]

## 2022-10-04 NOTE — TOC Progression Note (Signed)
Transition of Care [facility]) - Progression Note    Patient Details  Name: David Townsend MRN: [id number] Date of Birth: March 14, 1961  Transition [facility] Care [facility]) CM/SW Contact  Taya Ashbaugh, Olegario Messier, RN Phone Number: 10/04/2022, 9:54 AM  Clinical Narrative:Per [address] rep Lawerance Cruel still awaiting auth.       Expected Discharge Plan: Skilled Nursing Facility Barriers to Discharge: Insurance Authorization  Expected Discharge Plan and Services         Expected Discharge Date: 10/03/22                                     Social Determinants of Health (SDOH) Interventions SDOH Screenings   Food Insecurity: No Food Insecurity (09/21/2022)  Housing: Low Risk  (09/21/2022)  Transportation Needs: No Transportation Needs (09/21/2022)  Utilities: Not At Risk (09/21/2022)  Depression (PHQ2-9): Low Risk  (08/22/2022)  Tobacco Use: High Risk (09/19/2022)    Readmission Risk Interventions     No data to display

## 2022-10-09 ENCOUNTER — Telehealth: Payer: Self-pay

## 2022-10-09 NOTE — Progress Notes (Unsigned)
TRANSITION OF CARE VISIT    Date of Admission: 09/19/2022   Date of Discharge: 10/04/2022  Transitions of Care Call: 10/09/2022  Discharged from: [facility]   Discharge Diagnosis:  Principal Problem:   Sepsis [facility]) Active Problems:   ETOH abuse   Hyperlipidemia   Prostate cancer metastatic to bone (HCC)   Tobacco abuse   Essential hypertension   Rhabdomyolysis   Hyponatremia   Lobar pneumonia, unspecified organism (HCC)   ARDS (adult respiratory distress syndrome) (HCC)   Summary of Admission per MD note: Following problems were addressed during the hospitalization:   Sepsis secondary to UTI, HCAP -UTI related with chronic indwelling Foley catheter, patient was febrile, tachycardic on admission and met sepsis criteria.  He was COVID-negative, blood cultures are negative to date.  Urine culture showed E. coli and Enterococcus which are pansensitive and has completed the treatment.  -Due to developing respiratory failure and concern for HCAP he was started on cefepime.  Due to persistent fevers ID consulted 4/25.  Eventually his fever subsided, and he completed an antibiotic course on 4/28.  Afebrile now.    Acute hypoxemic respiratory failure, concern for ARDS- Overnight 4/20-21, he became hypoxic, saturating 80% on room air.  Required up to 10 L high flow oxygen.  Remains on room air now .  Status post Lasix x 2 over the weekend.     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  Right upper quadrant ultrasound with concern for cirrhosis.  2D echo unremarkable -LFTs improving, and overall stable.Lipitor on hold   Rhabdomyolysis -Improved with IV fluid   Metastatic prostate cancer -Followed by Dr. Berneice Heinrich, [facility].has chronic foley   Hypertension -continue amlodipine.  Normotensive.  We will continue  to hold  olmesartan and spironolactone upon discharge given normal blood pressure currently on amlodipine alone   Hyperlipidemia -Lipitor -on hold due to elevation in LFTs   Alcohol abuse -HE was on CIWA protocol.Currently on in withdrawal. Continue  B1, folic acid   Tobacco abuse -Continue nicotine patch    Fall with generalized weakness -PT OT,recommending  SNF for discharge   Today's visit 10/11/2022: Reports since hospital discharge feeling better. Reports bilateral ankle swelling x 2 days. He denies associated red flag symptoms of bilateral lower extremities. He does not monitor salt intake. Reports he is taking all medications prescribed at hospital discharge. Reports he does not want to take the nicotine patch and has tried to decrease smoking. Reports Home Health called him to establish care and he declined their services. Reports they also offered him a walker and he declined. Reports he is walking as normal. Having some lower back pain related to bending over extensively while gardening on yesterday. Using over-the-counter Icy Hot to help. He denies recent trauma/injury and red flag symptoms associated with back pain. He is aware to keep all scheduled appointments with Urology. No further issues/concerns for discussion today.  Patient/Caregiver self-reported problems/concerns: see above  MEDICATIONS  Medication Reconciliation conducted with patient/caregiver? (Yes/ No): yes  New medications prescribed/discontinued upon discharge? (Yes/No):  yes  Barriers identified related to medications: none  LABS  Lab Reviewed (Yes/No/NA): yes  PHYSICAL EXAM:  Physical Exam HENT:     Head: Normocephalic and atraumatic.  Eyes:     Extraocular Movements: Extraocular movements intact.     Conjunctiva/sclera: Conjunctivae normal.     Pupils: Pupils are equal, round,  and reactive to light.  Cardiovascular:     Rate and Rhythm: Normal rate and regular rhythm.     Pulses: Normal pulses.     Heart  sounds: Normal heart sounds.  Pulmonary:     Effort: Pulmonary effort is normal.     Breath sounds: Normal breath sounds.  Musculoskeletal:        General: Normal range of motion.     Cervical back: Normal range of motion and neck supple.     Right hip: Normal.     Left hip: Normal.     Right upper leg: Normal.     Left upper leg: Normal.     Right knee: Normal.     Left knee: Normal.     Right lower leg: Normal.     Left lower leg: Normal.     Right ankle: Swelling present.     Left ankle: Swelling present.     Right foot: Normal.     Left foot: Normal.  Skin:    General: Skin is warm and dry.  Neurological:     General: No focal deficit present.     Mental Status: He is alert and oriented to person, place, and time.  Psychiatric:        Mood and Affect: Mood normal.        Behavior: Behavior normal.    ASSESSMENT AND PLAN: 1. Hospital discharge follow-up - Reviewed hospital course, current medications, ensured proper follow-up in place, and addressed concerns.  - Routine screening.  - Basic Metabolic Panel  2. Sepsis, due to unspecified organism, unspecified whether acute organ dysfunction present (HCC) 3. Lobar pneumonia, unspecified organism (HCC) 4. ARDS (adult respiratory distress syndrome) [facility]) - Hospital discharge note indicates secondary to urinary tract infection and HCAP.  - Patient today in office with no acute cardiopulmonary/acute distress.  - Keep all scheduled appointments with [facility].   5. Prostate cancer metastatic to bone [facility]) - Keep all scheduled appointments with Oncology.   6. Non-traumatic rhabdomyolysis [facility] discharge note indicates improved with IV fluid.  7. Essential hypertension - Continue Amlodipine as prescribed. No refills needed as of present.  - Counseled on blood pressure goal of less than 130/80, low-sodium, DASH diet, medication compliance, and 150 minutes of moderate intensity exercise per week as tolerated.  Counseled on medication adherence and adverse effects. - Follow-up with primary provider in 3 months or sooner if needed.   8. Hyperlipidemia, unspecified hyperlipidemia type - Will continue to hold Atorvastatin as indicated at hospital discharge.   9. ETOH abuse - Continue Vitamin B-1 and Folic Acid as prescribed. No refills needed as of present.   10. Tobacco abuse - Counseled to quit.  - Patient declined pharmacological therapy.  11. Hyponatremia - Recheck BMP (see #1).  12. Dependent edema - Furosemide as prescribed. Counseled on medication adherence/adverse effects.  - Follow-up with primary provider in 1 week or sooner if needed.   - furosemide (LASIX) 20 MG tablet; Take 1 tablet (20 mg total) by mouth daily.  Dispense: 3 tablet; Refill: 0   PATIENT EDUCATION PROVIDED: See AVS   FOLLOW-UP (Include any further testing or referrals):  - Keep all scheduled appointments with Urology.  - Keep all scheduled appointments with Oncology.  - Follow-up with primary provider as scheduled.   Patient was given clear instructions to go to Emergency Department or return to medical center if symptoms don't improve, worsen, or new problems develop.The patient verbalized understanding.

## 2022-10-09 NOTE — Telephone Encounter (Signed)
Please schedule appointment.

## 2022-10-09 NOTE — Transitions of Care (Post Inpatient/ED Visit) (Signed)
   10/09/2022  Name: David Townsend MRN: [id number] DOB: 1961/05/01  Today's TOC FU Call Status: Today's TOC FU Call Status:: Successful TOC FU Call Competed TOC FU Call Complete Date: 10/09/22  Transition Care Management Follow-up Telephone Call Date of Discharge: 10/08/22 Discharge Facility: Other ([facility]) Name of Other ([facility]) Discharge Facility: [facility] Type of Discharge: Inpatient Admission Primary Inpatient Discharge Diagnosis:: Sepsis How have you been since you were released from the hospital?: Better Any questions or concerns?: Yes Patient Questions/Concerns:: ankles and feet are swelling, just started today Patient Questions/Concerns Addressed: Notified Provider of Patient Questions/Concerns  Items Reviewed: Did you receive and understand the discharge instructions provided?: Yes Medications obtained,verified, and reconciled?: Yes (Medications Reviewed) Any new allergies since your discharge?: No Dietary orders reviewed?: NA Do you have support at home?: Yes People in Home: significant other  Medications Reviewed Today: Medications Reviewed Today     Reviewed by Rulon Sera, CPhT (Pharmacy Technician) on 09/20/22 at 0850  Med List Status: Complete   Medication Order Taking? Sig Documenting Provider Last Dose Status Informant  acetaminophen (TYLENOL) 500 MG tablet [id number] Yes Take 1,000 mg by mouth every 6 (six) hours as needed for moderate pain. [provider] Past Week Active Self, Pharmacy Records  amLODipine (NORVASC) 10 MG tablet [id number] Yes Take 1 tablet (10 mg total) by mouth daily. Rema Fendt, NP 09/18/2022 Active Self, Pharmacy Records  Ascorbic Acid (VITAMIN C PO) [id number] Yes Take 1 tablet by mouth daily. [provider] Past Week Active Self, Pharmacy Records  atorvastatin (LIPITOR) 40 MG tablet [id number] Yes Take 1 tablet (40 mg total) by mouth daily. Rema Fendt, NP 09/18/2022 Active Self,  Pharmacy Records  ERLEADA 60 MG tablet [id number] Yes Take 120 mg by mouth daily. [provider] 09/18/2022 Active Self, Pharmacy Records  olmesartan (BENICAR) 40 MG tablet [id number] Yes TAKE 1 TABLET(40 MG) BY MOUTH DAILY  Patient taking differently: Take 40 mg by mouth daily.   Rema Fendt, NP 09/18/2022 Active Self, Pharmacy Records  spironolactone (ALDACTONE) 25 MG tablet [id number] Yes Take 0.5 tablets (12.5 mg total) by mouth daily. Rema Fendt, NP 09/18/2022 Active Self, Pharmacy Records  VITAMIN D PO [id number] Yes Take 1 capsule by mouth daily. [provider] Past Week Active Self, Pharmacy Records            Home Care and Equipment/Supplies: Were Home Health Services Ordered?: NA Any new equipment or medical supplies ordered?: NA  Functional Questionnaire: Do you need assistance with bathing/showering or dressing?: No Do you need assistance with meal preparation?: No Do you need assistance with eating?: No Do you have difficulty maintaining continence: No Do you need assistance with getting out of bed/getting out of a chair/moving?: No Do you have difficulty managing or taking your medications?: No  Follow up appointments reviewed: PCP Follow-up appointment confirmed?: No (no appointments avaialble, message sent to provider to schedule patient ASAP) MD Provider Line Number:[phone] Given: Yes Specialist Hospital Follow-up appointment confirmed?: NA Do you need transportation to your follow-up appointment?: No Do you understand care options if your condition(s) worsen?: Yes-patient verbalized understanding    SIGNATURE  Agnes Lawrence, CMA (AAMA)  [facility] [phone]

## 2022-10-11 ENCOUNTER — Ambulatory Visit (INDEPENDENT_AMBULATORY_CARE_PROVIDER_SITE_OTHER): Payer: 59 | Admitting: Family

## 2022-10-11 ENCOUNTER — Encounter: Payer: Self-pay | Admitting: Family

## 2022-10-11 VITALS — BP 100/63 | HR 90 | Temp 98.1°F | Resp 14 | Ht 70.0 in | Wt 141.2 lb

## 2022-10-11 DIAGNOSIS — J80 Acute respiratory distress syndrome: Secondary | ICD-10-CM | POA: Diagnosis not present

## 2022-10-11 DIAGNOSIS — C7951 Secondary malignant neoplasm of bone: Secondary | ICD-10-CM

## 2022-10-11 DIAGNOSIS — Z09 Encounter for follow-up examination after completed treatment for conditions other than malignant neoplasm: Secondary | ICD-10-CM

## 2022-10-11 DIAGNOSIS — A419 Sepsis, unspecified organism: Secondary | ICD-10-CM | POA: Diagnosis not present

## 2022-10-11 DIAGNOSIS — R609 Edema, unspecified: Secondary | ICD-10-CM

## 2022-10-11 DIAGNOSIS — E785 Hyperlipidemia, unspecified: Secondary | ICD-10-CM

## 2022-10-11 DIAGNOSIS — M6282 Rhabdomyolysis: Secondary | ICD-10-CM

## 2022-10-11 DIAGNOSIS — Z720 Tobacco use: Secondary | ICD-10-CM

## 2022-10-11 DIAGNOSIS — J181 Lobar pneumonia, unspecified organism: Secondary | ICD-10-CM

## 2022-10-11 DIAGNOSIS — I10 Essential (primary) hypertension: Secondary | ICD-10-CM

## 2022-10-11 DIAGNOSIS — F1010 Alcohol abuse, uncomplicated: Secondary | ICD-10-CM

## 2022-10-11 DIAGNOSIS — E871 Hypo-osmolality and hyponatremia: Secondary | ICD-10-CM

## 2022-10-11 DIAGNOSIS — F17210 Nicotine dependence, cigarettes, uncomplicated: Secondary | ICD-10-CM

## 2022-10-11 MED ORDER — FUROSEMIDE 20 MG PO TABS
20.0000 mg | ORAL_TABLET | Freq: Every day | ORAL | 0 refills | Status: DC
Start: 2022-10-11 — End: 2024-04-10

## 2022-10-11 NOTE — Progress Notes (Signed)
Pt is here for hos f/u   Complaining of bi-lat ankle swelling for X3 days

## 2022-12-03 ENCOUNTER — Encounter: Payer: 59 | Admitting: Family

## 2022-12-13 ENCOUNTER — Other Ambulatory Visit: Payer: Self-pay | Admitting: Family

## 2022-12-13 DIAGNOSIS — I10 Essential (primary) hypertension: Secondary | ICD-10-CM

## 2022-12-21 ENCOUNTER — Telehealth: Payer: Self-pay

## 2022-12-21 NOTE — Telephone Encounter (Signed)
Pharmacy request ref on spironolactone (ALDACTONE) 25 MG tablet . Sig: Take 0.5 tablets (12.5 mg total) by mouth daily. Medication not on Pt list, but last refilled March 2024. Pt states he is still taking medication please advise.

## 2022-12-25 NOTE — Telephone Encounter (Signed)
Call patient with update. On 10/04/2022 at hospital discharge from [facility] Spironolactone was held. During office visit on 10/11/2022 blood pressure normal and continued on Amlodipine as recommended at hospital discharge. Please schedule Primary Care appointment for further evaluation/management. During the interim report to the Emergency Department/Urgent Care for immediate medical evaluation.

## 2022-12-26 NOTE — Telephone Encounter (Signed)
Called Pt and let him know, he has an appt in August with [place].

## 2023-01-04 ENCOUNTER — Encounter: Payer: Self-pay | Admitting: Family

## 2023-01-04 ENCOUNTER — Ambulatory Visit (INDEPENDENT_AMBULATORY_CARE_PROVIDER_SITE_OTHER): Payer: 59 | Admitting: Family

## 2023-01-04 VITALS — BP 135/75 | HR 102 | Temp 98.1°F | Ht 69.0 in | Wt 141.6 lb

## 2023-01-04 DIAGNOSIS — M79672 Pain in left foot: Secondary | ICD-10-CM

## 2023-01-04 DIAGNOSIS — M79671 Pain in right foot: Secondary | ICD-10-CM | POA: Diagnosis not present

## 2023-01-04 DIAGNOSIS — R2689 Other abnormalities of gait and mobility: Secondary | ICD-10-CM

## 2023-01-04 DIAGNOSIS — Z1211 Encounter for screening for malignant neoplasm of colon: Secondary | ICD-10-CM

## 2023-01-04 DIAGNOSIS — R634 Abnormal weight loss: Secondary | ICD-10-CM

## 2023-01-04 DIAGNOSIS — R5383 Other fatigue: Secondary | ICD-10-CM | POA: Diagnosis not present

## 2023-01-04 DIAGNOSIS — I10 Essential (primary) hypertension: Secondary | ICD-10-CM

## 2023-01-04 DIAGNOSIS — F439 Reaction to severe stress, unspecified: Secondary | ICD-10-CM

## 2023-01-04 MED ORDER — AMLODIPINE BESYLATE 10 MG PO TABS
10.0000 mg | ORAL_TABLET | Freq: Every day | ORAL | 0 refills | Status: DC
Start: 2023-01-04 — End: 2023-04-17

## 2023-01-04 NOTE — Progress Notes (Signed)
Patient ID: David Townsend, male    DOB: 1960-07-21  MRN: [id number]  CC: Follow-Up  Subjective: David Townsend is a 62 y.o. male who presents for follow-up.   His concerns today include:  - Decreased energy, strength, and weakness. He denies associated red flag symptoms. - Reports has lost some weight. Reports he is eating.  - States his "balance is off" and "feet feel like cardboard" but doesn't hurt. He denies associated red flag symptoms/recent falls.  - Increased stress due to caregiver to his father. Reports he is drinking several cans of beer daily. He denies thoughts of self-harm, suicidal ideations, homicidal ideations. - Doing well on Amlodipine, no issues/concerns. He does not complain of red flag symptoms such as but not limited to chest pain, shortness of breath, worst headache of life, nausea/vomiting.  - Established with Oncology.  - Established with [facility].  - No further issues/concerns for discussion today.    Patient Active Problem List   Diagnosis Date Noted   Lobar pneumonia, unspecified organism (HCC) 09/28/2022   ARDS (adult respiratory distress syndrome) (HCC) 09/28/2022   Rhabdomyolysis 09/19/2022   Hyponatremia 09/19/2022   Sepsis (HCC) 09/19/2022   Prostate cancer metastatic to bone (HCC) 04/23/2022   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   Tobacco abuse 04/23/2022   Essential hypertension 04/23/2022   Urinary retention 04/23/2022   Hyperlipidemia 03/27/2022   ETOH abuse 11/01/2015     Current Outpatient Medications on File Prior to Visit  Medication Sig Dispense Refill   acetaminophen (TYLENOL) 500 MG tablet Take 1,000 mg by mouth every 6 (six) hours as needed for moderate pain.     Ascorbic Acid (VITAMIN C PO) Take 1 tablet by mouth daily.     ERLEADA 60 MG tablet Take 120 mg by mouth daily.     folic acid (FOLVITE) 1 MG tablet Take 1 tablet (1 mg total) by mouth daily.     furosemide (LASIX) 20  MG tablet Take 1 tablet (20 mg total) by mouth daily. 3 tablet 0   ibuprofen (ADVIL) 800 MG tablet Take 1 tablet (800 mg total) by mouth every 8 (eight) hours as needed for fever, mild pain or headache (alternate with tylenol for fever). 30 tablet 0   lidocaine (LIDODERM) 5 % Place 1 patch onto the skin daily. Remove & Discard patch within 12 hours or as directed by MD 30 patch 0   Multiple Vitamin (MULTIVITAMIN WITH MINERALS) TABS tablet Take 1 tablet by mouth daily.     nicotine (NICODERM CQ - DOSED IN MG/24 HOURS) 21 mg/24hr patch Place 1 patch (21 mg total) onto the skin daily. 28 patch 0   senna-docusate (SENOKOT-S) 8.6-50 MG tablet Take 1 tablet by mouth 2 (two) times daily.     thiamine (VITAMIN B-1) 100 MG tablet Take 1 tablet (100 mg total) by mouth daily.     VITAMIN D PO Take 1 capsule by mouth daily.     No current facility-administered medications on file prior to visit.    No Active Allergies  Social History   Socioeconomic History   Marital status: Single    Spouse name: n/a   Number of children: 0   Years of education: 12+   Highest education level: Not on file  Occupational History   Occupation: golf guy at [employer]  Tobacco Use   Smoking status: Every Day    Current packs/day: 2.00    Average packs/day:  2.0 packs/day for 34.0 years (68.0 ttl pk-yrs)    Types: Cigarettes    Passive exposure: Current   Smokeless tobacco: Never  Vaping Use   Vaping status: Never Used  Substance and Sexual Activity   Alcohol use: Yes    Alcohol/week: 0.0 standard drinks of alcohol    Comment: 5-6 beers per day   Drug use: No   Sexual activity: Not on file  Other Topics Concern   Not on file  Social History Narrative   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   Lives alone.   Golf Pro   Social Determinants of Health   Financial Resource Strain: Not on file  Food Insecurity: No Food Insecurity (09/21/2022)   Hunger Vital Sign    Worried About  Running Out of Food in the Last Year: Never true    Ran Out of Food in the Last Year: Never true  Transportation Needs: No Transportation Needs (09/21/2022)   PRAPARE - Administrator, Civil Service (Medical): No    Lack of Transportation (Non-Medical): No  Physical Activity: Not on file  Stress: Not on file  Social Connections: Not on file  Intimate Partner Violence: Not At Risk (09/21/2022)   Humiliation, Afraid, Rape, and Kick questionnaire    Fear of Current or Ex-Partner: No    Emotionally Abused: No    Physically Abused: No    Sexually Abused: No    Family History  Family history unknown: Yes    Past Surgical History:  Procedure Laterality Date   NO PAST SURGERIES     PENILE BIOPSY N/A 01/12/2020   Procedure: EXCISION OF PENILE LESION;  Surgeon: Noel Christmas, MD;  Location: [facility] [place];  Service: Urology;  Laterality: N/A;    ROS: Review of Systems Negative except as stated above  PHYSICAL EXAM: BP 135/75   Pulse (!) 102   Temp 98.1 F (36.7 C) (Oral)   Ht 5\' 9"  (1.753 m)   Wt 141 lb 9.6 oz (64.2 kg)   SpO2 98%   BMI 20.91 kg/m   Physical Exam HENT:     Head: Normocephalic and atraumatic.     Nose: Nose normal.     Mouth/Throat:     Mouth: Mucous membranes are moist.     Pharynx: Oropharynx is clear.  Eyes:     Extraocular Movements: Extraocular movements intact.     Conjunctiva/sclera: Conjunctivae normal.     Pupils: Pupils are equal, round, and reactive to light.  Cardiovascular:     Rate and Rhythm: Tachycardia present.     Pulses: Normal pulses.     Heart sounds: Normal heart sounds.  Pulmonary:     Effort: Pulmonary effort is normal.     Breath sounds: Normal breath sounds.  Musculoskeletal:        General: Normal range of motion.     Right shoulder: Normal.     Left shoulder: Normal.     Right upper arm: Normal.     Left upper arm: Normal.     Right elbow: Normal.     Left elbow: Normal.     Right forearm:  Normal.     Left forearm: Normal.     Right wrist: Normal.     Left wrist: Normal.     Right hand: Normal.     Left hand: Normal.     Cervical back: Normal, normal range of motion and neck supple.  Thoracic back: Normal.     Lumbar back: Normal.     Right hip: Normal.     Left hip: Normal.     Right upper leg: Normal.     Left upper leg: Normal.     Right knee: Normal.     Left knee: Normal.     Right lower leg: Normal.     Left lower leg: Normal.     Right ankle: Normal.     Left ankle: Normal.     Right foot: Normal.     Left foot: Normal.  Skin:    General: Skin is warm and dry.  Neurological:     General: No focal deficit present.     Mental Status: He is alert and oriented to person, place, and time.  Psychiatric:        Mood and Affect: Mood normal.        Behavior: Behavior normal.     ASSESSMENT AND PLAN: 1. Fatigue, unspecified type - Routine screening.  - CBC - TSH - Vitamin D, 25-hydroxy - Vitamin B12 - CMP14+EGFR - Hemoglobin A1c - Vitamin B1  2. Loss of weight - Referral to Medical Weight Management for further evaluation/management.  - Amb Ref to Medical Weight Management  3. Imbalance - Referral to Physical Therapy for further evaluation/management.  - Ambulatory referral to Physical Therapy  4. Pain in both feet - Referral to Podiatry for further evaluation/management.  - Ambulatory referral to Podiatry  5. Stress - Patient denies thoughts of self-harm, suicidal ideations, homicidal ideations. - Patient declined pharmacological therapy.  - Patient declined referral to Psychiatry. - Follow-up with primary provider as scheduled.   6. Primary hypertension - Continue Amlodipine as prescribed.  - Counseled on blood pressure goal of less than 130/80, low-sodium, DASH diet, medication compliance, and 150 minutes of moderate intensity exercise per week as tolerated. Counseled on medication adherence and adverse effects. - Follow-up with  primary provider as scheduled.  - amLODipine (NORVASC) 10 MG tablet; Take 1 tablet (10 mg total) by mouth daily.  Dispense: 90 tablet; Refill: 0  7. Colon cancer screening - Referral to Gastroenterology for colon cancer screening by colonoscopy. - Ambulatory referral to Gastroenterology   Patient was given the opportunity to ask questions.  Patient verbalized understanding of the plan and was able to repeat key elements of the plan. Patient was given clear instructions to go to Emergency Department or return to medical center if symptoms don't improve, worsen, or new problems develop.The patient verbalized understanding.   Orders Placed This Encounter  Procedures   CBC   TSH   Vitamin D, 25-hydroxy   Vitamin B12   CMP14+EGFR   Hemoglobin A1c   Vitamin B1   Ambulatory referral to Gastroenterology   Amb Ref to Medical Weight Management   Ambulatory referral to Physical Therapy   Ambulatory referral to Podiatry     Requested Prescriptions   Signed Prescriptions Disp Refills   amLODipine (NORVASC) 10 MG tablet 90 tablet 0    Sig: Take 1 tablet (10 mg total) by mouth daily.    Return in about 3 months (around 04/06/2023) for Follow-Up or next available chronic conditions mgmt .  Rema Fendt, NP

## 2023-01-07 ENCOUNTER — Telehealth: Payer: Self-pay | Admitting: Hematology and Oncology

## 2023-01-07 ENCOUNTER — Other Ambulatory Visit: Payer: Self-pay | Admitting: Family

## 2023-01-07 DIAGNOSIS — D649 Anemia, unspecified: Secondary | ICD-10-CM

## 2023-01-07 DIAGNOSIS — R748 Abnormal levels of other serum enzymes: Secondary | ICD-10-CM

## 2023-01-07 MED ORDER — IRON (FERROUS SULFATE) 325 (65 FE) MG PO TABS
325.0000 mg | ORAL_TABLET | Freq: Every day | ORAL | 0 refills | Status: DC
Start: 2023-01-07 — End: 2023-11-27

## 2023-01-07 NOTE — Telephone Encounter (Signed)
Left patient message with appointment details; left callback number if needed for reschedule

## 2023-01-18 ENCOUNTER — Telehealth: Payer: Self-pay

## 2023-01-18 NOTE — Telephone Encounter (Signed)
Pt called and wanted Lyla Son to know he went for the labs and was told he did not need those labs. So he left and did not get any labs done.

## 2023-01-18 NOTE — Telephone Encounter (Signed)
Called regarding appt on 8/23. Received a message from patient engagement that he does not need appt and is asking why it is scheduled.  Called him back and told him Dr. Bertis Ruddy received another referral from PCP.  Per Dr. Bertis Ruddy, she reviewed labs and he does not need to see her if he does not want to come back. Appt canceled per his request.

## 2023-01-25 ENCOUNTER — Ambulatory Visit: Payer: 59 | Admitting: Hematology and Oncology

## 2023-01-25 ENCOUNTER — Ambulatory Visit: Payer: Self-pay | Admitting: *Deleted

## 2023-01-25 NOTE — Telephone Encounter (Signed)
  Chief Complaint: medication questions regarding multiple medications and elevated BP , medications: amlodipine , sodium pills after hospital discharge, atorvastatin, olmasartan, ferrous sulfate. Symptoms: elevated BP today 155/95. Reports he stopped taking BP medication and feels more energy, not so fatigued Frequency: na  Pertinent Negatives: Patient denies chest pain no difficulty breathing no headache no weakness on either side no blurred vision no N/T reported.  Disposition: [] ED /[] Urgent Care (no appt availability in office) / [] Appointment(In office/virtual)/ []  [place] [facility]/ [] Home Care/ [] Refused Recommended Disposition /[] [place] [facility]/ [x]  Follow-up with PCP Additional Notes:   Recommended patient to continued taking amlodipine 10 mg as directed. Drink more water and recheck BP . Goal 130 /80. Patient reports he would like a call back today and does not want to take any medication until clarified with PCP. Please advise regarding amlodipine and olmasartan.  Patient reports [facility] reports there is no reason for him to come there. Please call patient back today next OV 01/31/23. No sx at this time.    Summary: Hypertension, medication questions?   The patient states he stopped taking his bp medication for a couple of days and says he has more energy and strength and does not feel so fatigued. He says he feet are also not as swollen. He doesn't know if he should stay off the medication since he feels better although his latest bp reading was 155/95. He also has questions about a few medications that were prescribed. Please assist patient further               Reason for Disposition  [1] Caller has URGENT medicine question about med that PCP or specialist prescribed AND [2] triager unable to answer question  Answer Assessment - Initial Assessment Questions 1. NAME of MEDICINE: "What medicine(s) are you calling about?"     Amlodipine 10 mg, olmesartan,  atorvastatin, sodium pills.  2. QUESTION: "What is your question?" (e.g., double dose of medicine, side effect)     What medications should patient be taking? Some are on current med list and some are not.  3. PRESCRIBER: "Who prescribed the medicine?" Reason: if prescribed by specialist, call should be referred to that group.     PCP 4. SYMPTOMS: "Do you have any symptoms?" If Yes, ask: "What symptoms are you having?"  "How bad are the symptoms (e.g., mild, moderate, severe)     Elevated BP  5. PREGNANCY:  "Is there any chance that you are pregnant?" "When was your last menstrual period?"     na  Protocols used: Medication Question Call-A-AH

## 2023-01-28 NOTE — Telephone Encounter (Signed)
Spoke to patient directly, and informed him of medications he is prescribed through [facility].

## 2023-01-28 NOTE — Telephone Encounter (Signed)
Called patient, left voicemail to go over his questions that were called in a few days ago. Gave him call back number to call the office to go over concerns.

## 2023-01-30 ENCOUNTER — Telehealth: Payer: Self-pay | Admitting: Family

## 2023-01-30 NOTE — Telephone Encounter (Signed)
Made in error

## 2023-01-31 ENCOUNTER — Ambulatory Visit (INDEPENDENT_AMBULATORY_CARE_PROVIDER_SITE_OTHER): Payer: 59 | Admitting: Family

## 2023-01-31 ENCOUNTER — Encounter: Payer: Self-pay | Admitting: Family

## 2023-01-31 VITALS — BP 133/87 | HR 95 | Temp 98.2°F | Ht 69.5 in | Wt 140.8 lb

## 2023-01-31 DIAGNOSIS — Z1211 Encounter for screening for malignant neoplasm of colon: Secondary | ICD-10-CM

## 2023-01-31 DIAGNOSIS — Z0000 Encounter for general adult medical examination without abnormal findings: Secondary | ICD-10-CM | POA: Diagnosis not present

## 2023-01-31 NOTE — Progress Notes (Signed)
Pt needs to know what medication he should be taking and should not be taking.

## 2023-01-31 NOTE — Patient Instructions (Signed)

## 2023-01-31 NOTE — Progress Notes (Signed)
Patient ID: David Townsend, male    DOB: 1961/06/01  MRN: [id number]  CC: Annual Physical Exam  Subjective: David Townsend is a 62 y.o. male who presents for annual physical exam.   His concerns today include:  None.   Patient Active Problem List   Diagnosis Date Noted   Lobar pneumonia, unspecified organism (HCC) 09/28/2022   ARDS (adult respiratory distress syndrome) (HCC) 09/28/2022   Rhabdomyolysis 09/19/2022   Hyponatremia 09/19/2022   Sepsis (HCC) 09/19/2022   Prostate cancer metastatic to bone (HCC) 04/23/2022   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   Tobacco abuse 04/23/2022   Essential hypertension 04/23/2022   Urinary retention 04/23/2022   Hyperlipidemia 03/27/2022   ETOH abuse 11/01/2015     Current Outpatient Medications on File Prior to Visit  Medication Sig Dispense Refill   acetaminophen (TYLENOL) 500 MG tablet Take 1,000 mg by mouth every 6 (six) hours as needed for moderate pain.     amLODipine (NORVASC) 10 MG tablet Take 1 tablet (10 mg total) by mouth daily. 90 tablet 0   Ascorbic Acid (VITAMIN C PO) Take 1 tablet by mouth daily.     ERLEADA 60 MG tablet Take 120 mg by mouth daily.     folic acid (FOLVITE) 1 MG tablet Take 1 tablet (1 mg total) by mouth daily.     Iron, Ferrous Sulfate, 325 (65 Fe) MG TABS Take 325 mg by mouth daily. 90 tablet 0   Multiple Vitamin (MULTIVITAMIN WITH MINERALS) TABS tablet Take 1 tablet by mouth daily.     VITAMIN D PO Take 1 capsule by mouth daily.     furosemide (LASIX) 20 MG tablet Take 1 tablet (20 mg total) by mouth daily. (Patient not taking: Reported on 01/31/2023) 3 tablet 0   ibuprofen (ADVIL) 800 MG tablet Take 1 tablet (800 mg total) by mouth every 8 (eight) hours as needed for fever, mild pain or headache (alternate with tylenol for fever). (Patient not taking: Reported on 01/31/2023) 30 tablet 0   lidocaine (LIDODERM) 5 % Place 1 patch onto the skin daily. Remove & Discard  patch within 12 hours or as directed by MD (Patient not taking: Reported on 01/31/2023) 30 patch 0   nicotine (NICODERM CQ - DOSED IN MG/24 HOURS) 21 mg/24hr patch Place 1 patch (21 mg total) onto the skin daily. (Patient not taking: Reported on 01/31/2023) 28 patch 0   senna-docusate (SENOKOT-S) 8.6-50 MG tablet Take 1 tablet by mouth 2 (two) times daily. (Patient not taking: Reported on 01/31/2023)     thiamine (VITAMIN B-1) 100 MG tablet Take 1 tablet (100 mg total) by mouth daily. (Patient not taking: Reported on 01/31/2023)     No current facility-administered medications on file prior to visit.    No Active Allergies  Social History   Socioeconomic History   Marital status: Single    Spouse name: n/a   Number of children: 0   Years of education: 12+   Highest education level: Not on file  Occupational History   Occupation: golf guy at [employer]  Tobacco Use   Smoking status: Every Day    Current packs/day: 2.00    Average packs/day: 2.0 packs/day for 34.0 years (68.0 ttl pk-yrs)    Types: Cigarettes    Passive exposure: Current   Smokeless tobacco: Never  Vaping Use   Vaping status: Never Used  Substance and Sexual Activity   Alcohol use: Yes  Alcohol/week: 0.0 standard drinks of alcohol    Comment: 5-6 beers per day   Drug use: No   Sexual activity: Not on file  Other Topics Concern   Not on file  Social History Narrative   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   Lives alone.   Golf Pro   Social Determinants of Health   Financial Resource Strain: Not on file  Food Insecurity: No Food Insecurity (09/21/2022)   Hunger Vital Sign    Worried About Running Out of Food in the Last Year: Never true    Ran Out of Food in the Last Year: Never true  Transportation Needs: No Transportation Needs (09/21/2022)   PRAPARE - Administrator, Civil Service (Medical): No    Lack of Transportation (Non-Medical): No  Physical Activity: Not on  file  Stress: Not on file  Social Connections: Not on file  Intimate Partner Violence: Not At Risk (09/21/2022)   Humiliation, Afraid, Rape, and Kick questionnaire    Fear of Current or Ex-Partner: No    Emotionally Abused: No    Physically Abused: No    Sexually Abused: No    Family History  Family history unknown: Yes    Past Surgical History:  Procedure Laterality Date   NO PAST SURGERIES     PENILE BIOPSY N/A 01/12/2020   Procedure: EXCISION OF PENILE LESION;  Surgeon: Noel Christmas, MD;  Location: [facility];  Service: Urology;  Laterality: N/A;    ROS: Review of Systems Negative except as stated above  PHYSICAL EXAM: BP 133/87   Pulse 95   Temp 98.2 F (36.8 C) (Oral)   Ht 5' 9.5" (1.765 m)   Wt 140 lb 12.8 oz (63.9 kg)   PF 98 L/min   BMI 20.49 kg/m   Physical Exam HENT:     Head: Normocephalic and atraumatic.     Right Ear: Tympanic membrane, ear canal and external ear normal.     Left Ear: Tympanic membrane, ear canal and external ear normal.     Nose: Nose normal.     Mouth/Throat:     Mouth: Mucous membranes are moist.     Pharynx: Oropharynx is clear.  Eyes:     Extraocular Movements: Extraocular movements intact.     Conjunctiva/sclera: Conjunctivae normal.     Pupils: Pupils are equal, round, and reactive to light.  Cardiovascular:     Rate and Rhythm: Normal rate and regular rhythm.     Pulses: Normal pulses.     Heart sounds: Normal heart sounds.  Pulmonary:     Effort: Pulmonary effort is normal.     Breath sounds: Normal breath sounds.  Abdominal:     General: Bowel sounds are normal.     Palpations: Abdomen is soft.  Genitourinary:    Comments: Patient declined.  Musculoskeletal:        General: Normal range of motion.     Right shoulder: Normal.     Left shoulder: Normal.     Right upper arm: Normal.     Left upper arm: Normal.     Right elbow: Normal.     Left elbow: Normal.     Right forearm: Normal.      Left forearm: Normal.     Right wrist: Normal.     Left wrist: Normal.     Right hand: Normal.     Left hand: Normal.  Cervical back: Normal, normal range of motion and neck supple.     Thoracic back: Normal.     Lumbar back: Normal.     Right hip: Normal.     Left hip: Normal.     Right upper leg: Normal.     Left upper leg: Normal.     Right knee: Normal.     Left knee: Normal.     Right lower leg: Normal.     Left lower leg: Normal.     Right ankle: Normal.     Left ankle: Normal.     Right foot: Normal.     Left foot: Normal.  Skin:    General: Skin is warm and dry.     Capillary Refill: Capillary refill takes less than 2 seconds.  Neurological:     General: No focal deficit present.     Mental Status: He is alert and oriented to person, place, and time.  Psychiatric:        Mood and Affect: Mood normal.        Behavior: Behavior normal.     ASSESSMENT AND PLAN: Please note patient's previous office visit on 01/04/2023 and physical labs collected at that time.   1. Annual physical exam - Counseled on 150 minutes of exercise per week as tolerated, healthy eating (including decreased daily intake of saturated fats, cholesterol, added sugars, sodium), STI prevention, and routine healthcare maintenance.  2. Colon cancer screening - Routine screening.  - Ambulatory referral to Gastroenterology     Patient was given the opportunity to ask questions.  Patient verbalized understanding of the plan and was able to repeat key elements of the plan. Patient was given clear instructions to go to Emergency Department or return to medical center if symptoms don't improve, worsen, or new problems develop.The patient verbalized understanding.   Orders Placed This Encounter  Procedures   Ambulatory referral to Gastroenterology    Return in about 1 year (around 01/31/2024) for Physical per patient preference.  Rema Fendt, NP

## 2023-04-08 ENCOUNTER — Ambulatory Visit: Payer: 59 | Admitting: Family

## 2023-04-17 ENCOUNTER — Other Ambulatory Visit: Payer: Self-pay

## 2023-04-17 ENCOUNTER — Other Ambulatory Visit: Payer: Self-pay | Admitting: Family

## 2023-04-17 DIAGNOSIS — I10 Essential (primary) hypertension: Secondary | ICD-10-CM

## 2023-04-17 MED ORDER — AMLODIPINE BESYLATE 10 MG PO TABS
10.0000 mg | ORAL_TABLET | Freq: Every day | ORAL | 0 refills | Status: DC
Start: 2023-04-17 — End: 2023-12-04

## 2023-08-26 ENCOUNTER — Encounter (HOSPITAL_COMMUNITY): Payer: Self-pay | Admitting: Urology

## 2023-08-26 ENCOUNTER — Other Ambulatory Visit (HOSPITAL_COMMUNITY): Payer: Self-pay | Admitting: Urology

## 2023-08-26 DIAGNOSIS — C7951 Secondary malignant neoplasm of bone: Secondary | ICD-10-CM

## 2023-09-17 ENCOUNTER — Encounter (HOSPITAL_COMMUNITY)

## 2023-09-25 ENCOUNTER — Encounter (HOSPITAL_COMMUNITY): Payer: Self-pay

## 2023-09-25 ENCOUNTER — Encounter (HOSPITAL_COMMUNITY)

## 2023-09-25 ENCOUNTER — Other Ambulatory Visit (HOSPITAL_COMMUNITY)

## 2023-11-26 ENCOUNTER — Emergency Department (HOSPITAL_COMMUNITY)

## 2023-11-26 ENCOUNTER — Other Ambulatory Visit: Payer: Self-pay

## 2023-11-26 ENCOUNTER — Emergency Department (HOSPITAL_COMMUNITY)
Admission: EM | Admit: 2023-11-26 | Discharge: 2023-11-27 | Discharge status: Against Medical Advice | Attending: Emergency Medicine | Admitting: Emergency Medicine

## 2023-11-26 ENCOUNTER — Encounter (HOSPITAL_COMMUNITY): Payer: Self-pay

## 2023-11-26 DIAGNOSIS — Z5321 Procedure and treatment not carried out due to patient leaving prior to being seen by health care provider: Secondary | ICD-10-CM | POA: Diagnosis not present

## 2023-11-26 DIAGNOSIS — I10 Essential (primary) hypertension: Secondary | ICD-10-CM | POA: Diagnosis not present

## 2023-11-26 DIAGNOSIS — K2100 Gastro-esophageal reflux disease with esophagitis, without bleeding: Secondary | ICD-10-CM | POA: Diagnosis not present

## 2023-11-26 DIAGNOSIS — Z79899 Other long term (current) drug therapy: Secondary | ICD-10-CM | POA: Diagnosis not present

## 2023-11-26 DIAGNOSIS — Z8546 Personal history of malignant neoplasm of prostate: Secondary | ICD-10-CM | POA: Diagnosis not present

## 2023-11-26 DIAGNOSIS — R531 Weakness: Secondary | ICD-10-CM | POA: Insufficient documentation

## 2023-11-26 DIAGNOSIS — F17200 Nicotine dependence, unspecified, uncomplicated: Secondary | ICD-10-CM | POA: Diagnosis not present

## 2023-11-26 DIAGNOSIS — E871 Hypo-osmolality and hyponatremia: Secondary | ICD-10-CM | POA: Diagnosis not present

## 2023-11-26 DIAGNOSIS — D539 Nutritional anemia, unspecified: Secondary | ICD-10-CM | POA: Diagnosis not present

## 2023-11-26 DIAGNOSIS — R079 Chest pain, unspecified: Secondary | ICD-10-CM | POA: Insufficient documentation

## 2023-11-26 LAB — CBC WITH DIFFERENTIAL/PLATELET
Abs Immature Granulocytes: 0.03 10*3/uL (ref 0.00–0.07)
Basophils Absolute: 0.1 10*3/uL (ref 0.0–0.1)
Basophils Relative: 1 %
Eosinophils Absolute: 0.1 10*3/uL (ref 0.0–0.5)
Eosinophils Relative: 2 %
HCT: 36 % — ABNORMAL LOW (ref 39.0–52.0)
Hemoglobin: 12.3 g/dL — ABNORMAL LOW (ref 13.0–17.0)
Immature Granulocytes: 0 %
Lymphocytes Relative: 27 %
Lymphs Abs: 2.1 10*3/uL (ref 0.7–4.0)
MCH: 38.2 pg — ABNORMAL HIGH (ref 26.0–34.0)
MCHC: 34.2 g/dL (ref 30.0–36.0)
MCV: 111.8 fL — ABNORMAL HIGH (ref 80.0–100.0)
Monocytes Absolute: 0.7 10*3/uL (ref 0.1–1.0)
Monocytes Relative: 9 %
Neutro Abs: 4.6 10*3/uL (ref 1.7–7.7)
Neutrophils Relative %: 61 %
Platelets: 180 10*3/uL (ref 150–400)
RBC: 3.22 MIL/uL — ABNORMAL LOW (ref 4.22–5.81)
RDW: 13.7 % (ref 11.5–15.5)
WBC: 7.5 10*3/uL (ref 4.0–10.5)
nRBC: 0 % (ref 0.0–0.2)

## 2023-11-26 LAB — BASIC METABOLIC PANEL WITH GFR
Anion gap: 16 — ABNORMAL HIGH (ref 5–15)
BUN: 9 mg/dL (ref 8–23)
CO2: 20 mmol/L — ABNORMAL LOW (ref 22–32)
Calcium: 9.6 mg/dL (ref 8.9–10.3)
Chloride: 98 mmol/L (ref 98–111)
Creatinine, Ser: 0.87 mg/dL (ref 0.61–1.24)
GFR, Estimated: >60 mL/min (ref >60)
Glucose, Bld: 94 mg/dL (ref 70–99)
Potassium: 3.5 mmol/L (ref 3.5–5.1)
Sodium: 134 mmol/L — ABNORMAL LOW (ref 135–145)

## 2023-11-26 LAB — MAGNESIUM: Magnesium: 1.8 mg/dL (ref 1.7–2.4)

## 2023-11-26 LAB — TROPONIN I (HIGH SENSITIVITY): Troponin I (High Sensitivity): 8 ng/L (ref <18)

## 2023-11-26 NOTE — ED Provider Triage Note (Signed)
 Emergency Medicine Provider Triage Evaluation Note  David Townsend , a 63 y.o. male  was evaluated in triage.  Pt complains of chest pain intermittent for the past week.  Denies history of CAD.  States he is under a lot of stress..  Review of Systems  Positive: As above Negative: As above  Physical Exam  BP 131/73   Pulse 81   Temp 98.1 F (36.7 C)   Resp 18   Ht 5' 10 (1.778 m)   Wt 68 kg   SpO2 100%   BMI 21.52 kg/m  Gen:   Awake, no distress   Resp:  Normal effort  MSK:   Moves extremities without difficulty Other:    Medical Decision Making  Medically screening exam initiated at 6:24 PM.  Appropriate orders placed.  David Townsend was informed that the remainder of the evaluation will be completed by another provider, this initial triage assessment does not replace that evaluation, and the importance of remaining in the ED until their evaluation is complete.     Hildegard Loge, PA-C 11/26/23 1826

## 2023-11-26 NOTE — ED Triage Notes (Signed)
 Pt BIB [facility] for chest pain. Pt reports that he has been having chest pain off and on for the last week. Pt has been under a lot of stress recently with family and death of his father. Pt also reports some weakness at times. Denies SHOB. VSS. 324mg  aspirin administered via EMS. No nitroglycerin.

## 2023-11-26 NOTE — ED Notes (Signed)
 Pt decided to leave while waiting for a room. Pt is currently calling an Gisele and walking outside.

## 2023-11-27 ENCOUNTER — Emergency Department (HOSPITAL_COMMUNITY)

## 2023-11-27 ENCOUNTER — Other Ambulatory Visit: Payer: Self-pay | Admitting: Family

## 2023-11-27 ENCOUNTER — Emergency Department (HOSPITAL_COMMUNITY)
Admission: EM | Admit: 2023-11-27 | Discharge: 2023-11-27 | Disposition: A | Discharge status: Home / Self Care | Source: Home / Self Care | Attending: Emergency Medicine | Admitting: Emergency Medicine

## 2023-11-27 ENCOUNTER — Encounter (HOSPITAL_COMMUNITY): Payer: Self-pay

## 2023-11-27 DIAGNOSIS — E871 Hypo-osmolality and hyponatremia: Secondary | ICD-10-CM | POA: Insufficient documentation

## 2023-11-27 DIAGNOSIS — K2100 Gastro-esophageal reflux disease with esophagitis, without bleeding: Secondary | ICD-10-CM | POA: Insufficient documentation

## 2023-11-27 DIAGNOSIS — Z13220 Encounter for screening for lipoid disorders: Secondary | ICD-10-CM

## 2023-11-27 DIAGNOSIS — D539 Nutritional anemia, unspecified: Secondary | ICD-10-CM | POA: Insufficient documentation

## 2023-11-27 DIAGNOSIS — Z8546 Personal history of malignant neoplasm of prostate: Secondary | ICD-10-CM | POA: Insufficient documentation

## 2023-11-27 DIAGNOSIS — I10 Essential (primary) hypertension: Secondary | ICD-10-CM | POA: Insufficient documentation

## 2023-11-27 DIAGNOSIS — Z79899 Other long term (current) drug therapy: Secondary | ICD-10-CM | POA: Insufficient documentation

## 2023-11-27 DIAGNOSIS — D649 Anemia, unspecified: Secondary | ICD-10-CM

## 2023-11-27 DIAGNOSIS — F17200 Nicotine dependence, unspecified, uncomplicated: Secondary | ICD-10-CM | POA: Insufficient documentation

## 2023-11-27 LAB — TROPONIN I (HIGH SENSITIVITY)
Troponin I (High Sensitivity): 8 ng/L (ref <18)
Troponin I (High Sensitivity): 7 ng/L (ref <18)

## 2023-11-27 MED ORDER — AMLODIPINE BESYLATE 10 MG PO TABS: 10.0000 mg | ORAL_TABLET | Freq: Every day | ORAL | 0 refills | Status: DC

## 2023-11-27 MED ORDER — IOHEXOL 350 MG/ML SOLN
100.0000 mL | Freq: Once | INTRAVENOUS | Status: AC | PRN
  Administered 2023-11-27: 100 mL via INTRAVENOUS

## 2023-11-27 MED ORDER — MORPHINE SULFATE (PF) 4 MG/ML IV SOLN
4.0000 mg | Freq: Once | INTRAVENOUS | Status: AC
  Administered 2023-11-27: 4 mg via INTRAVENOUS
  Filled 2023-11-27: qty 1

## 2023-11-27 MED ORDER — IRON (FERROUS SULFATE) 325 (65 FE) MG PO TABS: 325.0000 mg | ORAL_TABLET | Freq: Every day | ORAL | 0 refills | Status: AC

## 2023-11-27 MED ORDER — PANTOPRAZOLE SODIUM 40 MG PO TBEC
40.0000 mg | DELAYED_RELEASE_TABLET | Freq: Every day | ORAL | 0 refills | Status: DC
Start: 2023-11-27 — End: 2024-04-10

## 2023-11-27 MED ORDER — PANTOPRAZOLE SODIUM 40 MG IV SOLR
40.0000 mg | Freq: Once | INTRAVENOUS | Status: AC
  Administered 2023-11-27: 40 mg via INTRAVENOUS
  Filled 2023-11-27: qty 10

## 2023-11-27 MED ORDER — ALUM & MAG HYDROXIDE-SIMETH 200-200-20 MG/5ML PO SUSP
30.0000 mL | Freq: Once | ORAL | Status: AC
  Administered 2023-11-27: 30 mL via ORAL
  Filled 2023-11-27: qty 30

## 2023-11-27 NOTE — Telephone Encounter (Signed)
 Copied from CRM [id number]. Topic: Clinical - Medication Refill >> Nov 27, 2023  9:49 AM DeAngela L wrote: Medication: Iron , Ferrous Sulfate , 325 (65 Fe) MG TABS amLODipine  (NORVASC ) 10 MG tablet   Has the patient contacted their pharmacy? No (Agent: If no, request that the patient contact the pharmacy for the refill. If patient does not wish to contact the pharmacy document the reason why and proceed with request.) (Agent: If yes, when and what did the pharmacy advise?)  This is the patient's preferred pharmacy:  [id number] [address] [place], [place] - [address] AT [facility] OF [facility] & [address] [place] [address] Phone: [phone] Fax: [phone]  Is this the correct pharmacy for this prescription? Yes  If no, delete pharmacy and type the correct one.   Has the prescription been filled recently? Yes   Is the patient out of the medication? Yes   Has the patient been seen for an appointment in the last year OR does the patient have an upcoming appointment? Yes   Can we respond through MyChart? No   Agent: Please be advised that Rx refills may take up to 3 business days. We ask that you follow-up with your pharmacy.

## 2023-11-27 NOTE — Telephone Encounter (Signed)
 Complete. Also, I do not see any cholesterol medication listed on patient's medication list. Schedule lab only appointment to recheck fasting cholesterol.

## 2023-11-27 NOTE — ED Provider Notes (Signed)
 [facility] AT [facility] Provider Note   CSN: [id number] Arrival date & time: 11/27/23  9785     Patient presents with: No chief complaint on file.   David Townsend is a 63 y.o. male.   The history is provided by the patient.  He has a history of hypertension, hyperlipidemia, prostate cancer with bone metastases and is in because of midsternal pain which has been present all day.  Pain is sharp and worse with a deep breath.  It does radiate through to the back.  He denies dyspnea, nausea, diaphoresis.  He had come to the emergency department by ambulance where he he was given aspirin and nitroglycerin with no improvement.  He left without being seen because of the long wait and returned because of ongoing pain.  Pain has not gone away at any point during the day.  He did try taking a BC powder which gave him no relief.  He is a smoker and he does endorse family history of premature coronary atherosclerosis.   Prior to Admission medications   Medication Sig Start Date End Date Taking? Authorizing Provider  acetaminophen  (TYLENOL ) 500 MG tablet Take 1,000 mg by mouth every 6 (six) hours as needed for moderate pain.    [provider]  amLODipine  (NORVASC ) 10 MG tablet TAKE 1 TABLET(10 MG) BY MOUTH DAILY 04/17/23   Lorren Greig PARAS, NP  amLODipine  (NORVASC ) 10 MG tablet Take 1 tablet (10 mg total) by mouth daily. 04/17/23 07/16/23  Lorren Greig PARAS, NP  Ascorbic Acid (VITAMIN C PO) Take 1 tablet by mouth daily.    [provider]  ERLEADA  60 MG tablet Take 120 mg by mouth daily. 02/20/22   [provider]  folic acid  (FOLVITE ) 1 MG tablet Take 1 tablet (1 mg total) by mouth daily. 10/04/22   Jillian Buttery, MD  furosemide  (LASIX ) 20 MG tablet Take 1 tablet (20 mg total) by mouth daily. Patient not taking: Reported on 01/31/2023 10/11/22   Lorren Greig PARAS, NP  ibuprofen  (ADVIL ) 800 MG tablet Take 1 tablet (800 mg total) by mouth every 8 (eight)  hours as needed for fever, mild pain or headache (alternate with tylenol  for fever). Patient not taking: Reported on 01/31/2023 10/03/22   Jillian Buttery, MD  Iron , Ferrous Sulfate , 325 (65 Fe) MG TABS Take 325 mg by mouth daily. 01/07/23   Lorren Greig PARAS, NP  lidocaine  (LIDODERM ) 5 % Place 1 patch onto the skin daily. Remove & Discard patch within 12 hours or as directed by MD Patient not taking: Reported on 01/31/2023 10/03/22   Adhikari, Amrit, MD  Multiple Vitamin (MULTIVITAMIN WITH MINERALS) TABS tablet Take 1 tablet by mouth daily. 10/04/22   Jillian Buttery, MD  nicotine  (NICODERM CQ  - DOSED IN MG/24 HOURS) 21 mg/24hr patch Place 1 patch (21 mg total) onto the skin daily. Patient not taking: Reported on 01/31/2023 10/04/22   Jillian Buttery, MD  senna-docusate (SENOKOT-S) 8.6-50 MG tablet Take 1 tablet by mouth 2 (two) times daily. Patient not taking: Reported on 01/31/2023 10/03/22   Jillian Buttery, MD  thiamine  (VITAMIN B-1) 100 MG tablet Take 1 tablet (100 mg total) by mouth daily. Patient not taking: Reported on 01/31/2023 10/04/22   Jillian Buttery, MD  VITAMIN D  PO Take 1 capsule by mouth daily.    [provider]    Allergies: Patient has no known allergies.    Review of Systems  All other systems reviewed and are negative.  Updated Vital Signs BP (!) 158/83   Pulse 82   Temp 98.5 F (36.9 C) (Oral)   Resp 18   Ht 5' 10 (1.778 m)   Wt 68 kg   SpO2 100%   BMI 21.52 kg/m   Physical Exam Vitals and nursing note reviewed.   63 year old male, resting comfortably and in no acute distress. Vital signs are significant for elevated blood pressure. Oxygen saturation is 100%, which is normal. Head is normocephalic and atraumatic. PERRLA, EOMI. Oropharynx is clear. Neck is nontender and supple without adenopathy. Lungs are clear without rales, wheezes, or rhonchi. Chest is nontender. Heart has regular rate and rhythm without murmur. Abdomen is soft, flat,  nontender. Extremities have no cyanosis or edema, full range of motion is present. Skin is warm and dry without rash. Neurologic: Mental status is normal, cranial nerves are intact, moves all extremities equally.  (all labs ordered are listed, but only abnormal results are displayed) Labs Reviewed - No data to display  EKG: EKG Interpretation Date/Time:  Wednesday November 27 2023 02:20:17 EDT Ventricular Rate:  73 PR Interval:  140 QRS Duration:  101 QT Interval:  386 QTC Calculation: 426 R Axis:   66  Text Interpretation: Sinus rhythm RSR' in V1 or V2, right VCD or RVH When compared with ECG of 11/26/2023, No significant change was found Confirmed by Raford Lenis ([id number]) on 11/27/2023 2:29:40 AM  Radiology: DG Chest 2 View Result Date: 11/26/2023 CLINICAL DATA:  chest pain EXAM: CHEST - 2 VIEW COMPARISON:  None available. FINDINGS: No focal airspace consolidation, pleural effusion, or pneumothorax. No cardiomegaly. No acute fracture or destructive lesion. IMPRESSION: No acute cardiopulmonary abnormality. Electronically Signed   By: Rogelia Myers M.D.   On: 11/26/2023 18:55     Procedures  Cardiac monitor shows normal sinus rhythm, per my interpretation. Medications Ordered in the ED  morphine (PF) 4 MG/ML injection 4 mg (4 mg Intravenous Given 11/27/23 0254)                HEART Score: 3                Geneva (Revised) Score: 5, Geneva Score Interpretation: Moderate Risk Group: ~20-30% incidence of pulmonary embolism from several studies PERC Score: 1, PERC Score Interpretation: If any criteria are positive, the PERC rule cannot be used to rule out PE in this patient Medical Decision Making Amount and/or Complexity of Data Reviewed Radiology: ordered.  Risk OTC drugs. Prescription drug management.   Chest pain and patient with significant risk factors for ACS as well as pulmonary embolism.  This is a presentation which has a wide range of treatment options and entails as  high risk of morbidity and complications.  Differential diagnosis includes, but is not limited to, ACS, GERD, pulmonary embolism, aortic dissection.  I reviewed his electrocardiogram from his initial presentation and my interpretation is nonspecific ST changes but unchanged from prior.  I have reviewed his ECG on return to the ED and my interpretation is unchanged from his ECG on initial presentation.  Chest x-ray shows no acute cardiopulmonary process.  I have independently viewed the images, and agree with radiologist's interpretation.  I reviewed his laboratory tests from his initial presentation and my interpretation is mild hyponatremia which is not felt to be clinically significant, normal magnesium , normal troponin, macrocytic anemia which had been present in the past, normal WBC and platelets.  I have ordered repeat troponin and I have ordered CT angiogram of  chest/abdomen/pelvis to evaluate for pulmonary embolism and dissection.  Heart score is 3 which puts him at moderate risk for major adverse cardiac events in the next 6 weeks.  Pulmonary embolism cannot be ruled out by [facility] or Geneva score.  I have reviewed his past records and note hospitalization on 09/23/2022 at which time CT angiogram showed coronary atherosclerosis but no aneurysm and pain at that time was due to bilateral pneumonia.  Doubt pneumonia today as he is afebrile and with normal oxygen saturation and normal WBC with normal chest x-ray.  Repeat troponin is normal.  CT angiogram shows widespread bony metastases, thickened esophageal wall consistent with esophagitis, no evidence of pulmonary embolism or aortic dissection.  I have independently viewed the images, and agree with the radiologist's interpretation.  I ordered a dose of Maalox which did give him significant relief of pain.  Pain is apparently from esophagitis most likely secondary to GERD.  I have ordered a dose of pantoprazole and I am discharging him with a prescription for  pantoprazole.  He will need to follow-up with his primary care provider, may benefit from GI consultation.     Final diagnoses:  Gastroesophageal reflux disease with esophagitis without hemorrhage  Hyponatremia  Macrocytic anemia    ED Discharge Orders          Ordered    pantoprazole (PROTONIX) 40 MG tablet  Daily        11/27/23 0621               Raford Lenis, MD 11/27/23 [phone]

## 2023-11-27 NOTE — ED Triage Notes (Signed)
 Pt BIB [facility] from home d/t CP onset 15 hours ago - aching throbbing - no cardiac hx .  Pt was POV earlier and left prior to rooming.

## 2023-11-27 NOTE — Telephone Encounter (Signed)
 Reason for CRM: patient is asking is he prescribed cholesterol pills or was he taken off of them ( he is asking cause he wants to know is he supposed to be taking them ? )

## 2023-11-27 NOTE — Discharge Instructions (Addendum)
 Your evaluation did not show any serious cause for your pain.  I believe pain is from acid washing back into the esophagus.  I am giving you a prescription for pantoprazole which will block acid production.  You may take an antacid as needed to get additional relief.  Please follow-up with your primary care provider in 1 week for reevaluation.  Return if symptoms are getting worse or you have new or concerning symptoms.

## 2023-11-28 ENCOUNTER — Emergency Department (HOSPITAL_COMMUNITY)
Admission: EM | Admit: 2023-11-28 | Discharge: 2023-11-29 | Disposition: A | Discharge status: Home / Self Care | Attending: Emergency Medicine | Admitting: Emergency Medicine

## 2023-11-28 ENCOUNTER — Other Ambulatory Visit

## 2023-11-28 ENCOUNTER — Telehealth: Payer: Self-pay | Admitting: Emergency Medicine

## 2023-11-28 ENCOUNTER — Other Ambulatory Visit: Payer: Self-pay

## 2023-11-28 ENCOUNTER — Encounter (HOSPITAL_COMMUNITY): Payer: Self-pay

## 2023-11-28 ENCOUNTER — Telehealth: Payer: Self-pay | Admitting: *Deleted

## 2023-11-28 DIAGNOSIS — Z13220 Encounter for screening for lipoid disorders: Secondary | ICD-10-CM

## 2023-11-28 DIAGNOSIS — I10 Essential (primary) hypertension: Secondary | ICD-10-CM | POA: Insufficient documentation

## 2023-11-28 DIAGNOSIS — Z79899 Other long term (current) drug therapy: Secondary | ICD-10-CM | POA: Diagnosis not present

## 2023-11-28 DIAGNOSIS — Z8546 Personal history of malignant neoplasm of prostate: Secondary | ICD-10-CM | POA: Diagnosis not present

## 2023-11-28 DIAGNOSIS — N3001 Acute cystitis with hematuria: Secondary | ICD-10-CM | POA: Diagnosis not present

## 2023-11-28 DIAGNOSIS — R000 Tachycardia, unspecified: Secondary | ICD-10-CM | POA: Insufficient documentation

## 2023-11-28 DIAGNOSIS — R109 Unspecified abdominal pain: Secondary | ICD-10-CM | POA: Diagnosis present

## 2023-11-28 DIAGNOSIS — R52 Pain, unspecified: Secondary | ICD-10-CM

## 2023-11-28 LAB — CK: Total CK: 138 U/L (ref 49–397)

## 2023-11-28 LAB — CBC
HCT: 37.6 % — ABNORMAL LOW (ref 39.0–52.0)
Hemoglobin: 12.5 g/dL — ABNORMAL LOW (ref 13.0–17.0)
MCH: 38.8 pg — ABNORMAL HIGH (ref 26.0–34.0)
MCHC: 33.2 g/dL (ref 30.0–36.0)
MCV: 116.8 fL — ABNORMAL HIGH (ref 80.0–100.0)
Platelets: 168 10*3/uL (ref 150–400)
RBC: 3.22 MIL/uL — ABNORMAL LOW (ref 4.22–5.81)
RDW: 13.5 % (ref 11.5–15.5)
WBC: 9.9 10*3/uL (ref 4.0–10.5)
nRBC: 0 % (ref 0.0–0.2)

## 2023-11-28 LAB — BASIC METABOLIC PANEL WITH GFR
Anion gap: 15 (ref 5–15)
BUN: 13 mg/dL (ref 8–23)
CO2: 22 mmol/L (ref 22–32)
Calcium: 9.9 mg/dL (ref 8.9–10.3)
Chloride: 97 mmol/L — ABNORMAL LOW (ref 98–111)
Creatinine, Ser: 0.96 mg/dL (ref 0.61–1.24)
GFR, Estimated: >60 mL/min (ref >60)
Glucose, Bld: 117 mg/dL — ABNORMAL HIGH (ref 70–99)
Potassium: 4 mmol/L (ref 3.5–5.1)
Sodium: 134 mmol/L — ABNORMAL LOW (ref 135–145)

## 2023-11-28 LAB — TROPONIN I (HIGH SENSITIVITY)
Troponin I (High Sensitivity): 6 ng/L (ref <18)
Troponin I (High Sensitivity): 6 ng/L (ref <18)

## 2023-11-28 LAB — URINALYSIS, ROUTINE W REFLEX MICROSCOPIC
Bilirubin Urine: NEGATIVE
Glucose, UA: NEGATIVE mg/dL
Ketones, ur: NEGATIVE mg/dL
Nitrite: POSITIVE — AB
Protein, ur: NEGATIVE mg/dL
Specific Gravity, Urine: 1.006 (ref 1.005–1.030)
pH: 5 (ref 5.0–8.0)

## 2023-11-28 LAB — HEPATIC FUNCTION PANEL
ALT: 27 U/L (ref 0–44)
AST: 29 U/L (ref 15–41)
Albumin: 5.2 g/dL — ABNORMAL HIGH (ref 3.5–5.0)
Alkaline Phosphatase: 147 U/L — ABNORMAL HIGH (ref 38–126)
Bilirubin, Direct: 0.1 mg/dL (ref 0.0–0.2)
Indirect Bilirubin: 1.1 mg/dL — ABNORMAL HIGH (ref 0.3–0.9)
Total Bilirubin: 1.2 mg/dL (ref 0.0–1.2)
Total Protein: 8.6 g/dL — ABNORMAL HIGH (ref 6.5–8.1)

## 2023-11-28 LAB — ETHANOL: Alcohol, Ethyl (B): <15 mg/dL (ref <15)

## 2023-11-28 MED ORDER — CEPHALEXIN 500 MG PO CAPS: 500.0000 mg | ORAL_CAPSULE | Freq: Two times a day (BID) | ORAL | 0 refills | Status: AC

## 2023-11-28 MED ORDER — LIDOCAINE VISCOUS HCL 2 % MT SOLN
15.0000 mL | Freq: Once | OROMUCOSAL | Status: AC
  Administered 2023-11-28: 15 mL via ORAL
  Filled 2023-11-28: qty 15

## 2023-11-28 MED ORDER — SODIUM CHLORIDE 0.9 % IV SOLN
1.0000 g | Freq: Once | INTRAVENOUS | Status: AC
  Administered 2023-11-28: 1 g via INTRAVENOUS
  Filled 2023-11-28: qty 10

## 2023-11-28 MED ORDER — HYDROCODONE-ACETAMINOPHEN 5-325 MG PO TABS: 1.0000 | ORAL_TABLET | ORAL | 0 refills | Status: AC | PRN

## 2023-11-28 MED ORDER — ALUM & MAG HYDROXIDE-SIMETH 200-200-20 MG/5ML PO SUSP
30.0000 mL | Freq: Once | ORAL | Status: AC
  Administered 2023-11-28: 30 mL via ORAL
  Filled 2023-11-28: qty 30

## 2023-11-28 MED ORDER — PANTOPRAZOLE SODIUM 40 MG IV SOLR
40.0000 mg | Freq: Once | INTRAVENOUS | Status: AC
  Administered 2023-11-28: 40 mg via INTRAVENOUS
  Filled 2023-11-28: qty 10

## 2023-11-28 MED ORDER — HYDROMORPHONE HCL 1 MG/ML IJ SOLN
1.0000 mg | Freq: Once | INTRAMUSCULAR | Status: AC
  Administered 2023-11-28: 1 mg via INTRAVENOUS
  Filled 2023-11-28: qty 1

## 2023-11-28 NOTE — Telephone Encounter (Signed)
 Patient called  back and I made him aware that he needed a lab collect to get medication refilled.  Sent call to front desk to schedule an appointment

## 2023-11-28 NOTE — Telephone Encounter (Signed)
 I have attempted without success to contact this patient by phone to discuss lab results and I left a message on answering machine.

## 2023-11-28 NOTE — Discharge Instructions (Signed)
 Your urinalysis did not show signs of infection today.  You will be placed on a short course of antibiotics.  Please take 1 tablet twice a day for the next 7 days.  You may also take Norco to help with severe pain.

## 2023-11-28 NOTE — ED Triage Notes (Signed)
 Pt came in for neck and back pain. Pt stated he went to [facility] on Tuesday for chest pain and was given acid reflux medicine. However, the chest pain has been alleviated but has radiated to his back and neck

## 2023-11-28 NOTE — ED Provider Notes (Signed)
 [facility]ERG[facility] DEPARTMENT AT [facility] ([place] Provider Note   CSN: [id number] Arrival date & time: 11/28/23  1757     Patient presents with: Back Pain and Neck Pain   David Townsend is a 63 y.o. male.   63 y.o male with a PMH of prostate cancer, metastatic disease, hypertension presents to the ED with a chief complaint of pain, back pain, right flank pain for the last few days.  Evaluated at [facility] yesterday, had a negative workup.  Reports that they thought it was likely acid reflux, he comes back today as the pain has worsened.  Reports that he is not able to keep the pain at bay.  Is very severe between his shoulder blades, especially whenever he tries to take a deep breath.  Did take some morphine  this morning from his deceased father reports there was no improvement in symptoms.  Tried some ibuprofen  with no improvement as well.No alleviating factors. No fevers.  The history is provided by the patient.  Back Pain Associated symptoms: chest pain   Associated symptoms: no abdominal pain and no fever   Neck Pain Associated symptoms: chest pain   Associated symptoms: no fever        Prior to Admission medications   Medication Sig Start Date End Date Taking? Authorizing Provider  cephALEXin  (KEFLEX ) 500 MG capsule Take 1 capsule (500 mg total) by mouth 2 (two) times daily for 7 days. 11/28/23 12/05/23 Yes Kaari Zeigler, PA-C  HYDROcodone -acetaminophen  (NORCO/VICODIN) 5-325 MG tablet Take 1 tablet by mouth every 4 (four) hours as needed for up to 3 days. 11/28/23 12/01/23 Yes Michaela Shankel, PA-C  amLODipine  (NORVASC ) 10 MG tablet Take 1 tablet (10 mg total) by mouth daily. 04/17/23 07/16/23  Lorren Greig PARAS, NP  amLODipine  (NORVASC ) 10 MG tablet Take 1 tablet (10 mg total) by mouth daily. 11/27/23   Lorren Greig PARAS, NP  Ascorbic Acid (VITAMIN C PO) Take 1 tablet by mouth daily.    [provider]  ERLEADA  60 MG tablet Take 120 mg by mouth daily. 02/20/22   [provider]  folic acid  (FOLVITE ) 1 MG tablet Take 1 tablet (1 mg total) by mouth daily. 10/04/22   Jillian Buttery, MD  furosemide  (LASIX ) 20 MG tablet Take 1 tablet (20 mg total) by mouth daily. Patient not taking: Reported on 01/31/2023 10/11/22   Lorren Greig PARAS, NP  ibuprofen  (ADVIL ) 800 MG tablet Take 1 tablet (800 mg total) by mouth every 8 (eight) hours as needed for fever, mild pain or headache (alternate with tylenol  for fever). Patient not taking: Reported on 01/31/2023 10/03/22   Jillian Buttery, MD  Iron , Ferrous Sulfate , 325 (65 Fe) MG TABS Take 325 mg by mouth daily. 11/27/23   Lorren Greig PARAS, NP  lidocaine  (LIDODERM ) 5 % Place 1 patch onto the skin daily. Remove & Discard patch within 12 hours or as directed by MD Patient not taking: Reported on 01/31/2023 10/03/22   Adhikari, Amrit, MD  Multiple Vitamin (MULTIVITAMIN WITH MINERALS) TABS tablet Take 1 tablet by mouth daily. 10/04/22   Adhikari, Amrit, MD  nicotine  (NICODERM CQ  - DOSED IN MG/24 HOURS) 21 mg/24hr patch Place 1 patch (21 mg total) onto the skin daily. Patient not taking: Reported on 01/31/2023 10/04/22   Jillian Buttery, MD  pantoprazole  (PROTONIX ) 40 MG tablet Take 1 tablet (40 mg total) by mouth daily. 11/27/23   Raford Lenis, MD  senna-docusate (SENOKOT-S) 8.6-50 MG tablet Take 1 tablet by mouth 2 (  two) times daily. Patient not taking: Reported on 01/31/2023 10/03/22   Jillian Buttery, MD  thiamine  (VITAMIN B-1) 100 MG tablet Take 1 tablet (100 mg total) by mouth daily. Patient not taking: Reported on 01/31/2023 10/04/22   Jillian Buttery, MD  VITAMIN D  PO Take 1 capsule by mouth daily.    [provider]    Allergies: Patient has no known allergies.    Review of Systems  Constitutional:  Negative for chills and fever.  Respiratory:  Positive for shortness of breath.   Cardiovascular:  Positive for chest pain.  Gastrointestinal:  Negative for abdominal pain.  Musculoskeletal:  Positive for back pain and neck pain.   All other systems reviewed and are negative.   Updated Vital Signs BP 135/80   Pulse 80   Temp 98.9 F (37.2 C) (Oral)   Resp 18   Ht 5' 10 (1.778 m)   Wt 68 kg   SpO2 92%   BMI 21.51 kg/m   Physical Exam Vitals and nursing note reviewed.  Constitutional:      Appearance: Normal appearance.  HENT:     Head: Normocephalic and atraumatic.     Mouth/Throat:     Mouth: Mucous membranes are moist.   Cardiovascular:     Rate and Rhythm: Tachycardia present.  Pulmonary:     Effort: Pulmonary effort is normal.     Breath sounds: No wheezing.     Comments: Minimal effort with deep inspiration. Abdominal:     General: Abdomen is flat.     Palpations: Abdomen is soft.     Tenderness: There is no abdominal tenderness.   Musculoskeletal:     Cervical back: Normal range of motion and neck supple.   Skin:    General: Skin is warm and dry.   Neurological:     Mental Status: He is alert and oriented to person, place, and time.     (all labs ordered are listed, but only abnormal results are displayed) Labs Reviewed  BASIC METABOLIC PANEL WITH GFR - Abnormal; Notable for the following components:      Result Value   Sodium 134 (*)    Chloride 97 (*)    Glucose, Bld 117 (*)    All other components within normal limits  CBC - Abnormal; Notable for the following components:   RBC 3.22 (*)    Hemoglobin 12.5 (*)    HCT 37.6 (*)    MCV 116.8 (*)    MCH 38.8 (*)    All other components within normal limits  HEPATIC FUNCTION PANEL - Abnormal; Notable for the following components:   Total Protein 8.6 (*)    Albumin 5.2 (*)    Alkaline Phosphatase 147 (*)    Indirect Bilirubin 1.1 (*)    All other components within normal limits  URINALYSIS, ROUTINE W REFLEX MICROSCOPIC - Abnormal; Notable for the following components:   Hgb urine dipstick MODERATE (*)    Nitrite POSITIVE (*)    Leukocytes,Ua SMALL (*)    Bacteria, UA MANY (*)    All other components within normal  limits  URINE CULTURE  CK  ETHANOL  TROPONIN I (HIGH SENSITIVITY)  TROPONIN I (HIGH SENSITIVITY)    EKG: None  Radiology: CT Angio Chest/Abd/Pel for Dissection W and/or Wo Contrast Result Date: 11/27/2023 CLINICAL DATA:  Chest pain and suspected acute aortic syndrome. EXAM: CT ANGIOGRAPHY CHEST, ABDOMEN AND PELVIS TECHNIQUE: Noncontrast chest CT was initially obtained. Multidetector CT imaging through the chest, abdomen and  pelvis was performed using the standard protocol during bolus administration of intravenous contrast. Multiplanar reconstructed images and MIPs were obtained and reviewed to evaluate the vascular anatomy. RADIATION DOSE REDUCTION: This exam was performed according to the departmental dose-optimization program which includes automated exposure control, adjustment of the mA and/or kV according to patient size and/or use of iterative reconstruction technique. CONTRAST:  OMNIPAQUE  IOHEXOL  350 MG/ML SOLN COMPARISON:  PA Lat chest 11/26/2023, chest AP portable 09/27/2022, CTA chest 09/23/2022, and CT abdomen and pelvis with contrast 07/12/2021. FINDINGS: CTA CHEST FINDINGS Cardiovascular: There are left main and three-vessel coronary artery calcifications heaviest in the LAD. The cardiac size is normal. There is no pericardial effusion. No arterial or venous dilatation. Anatomic variant left-sided SVC again noted, drains to the coronary sinus. There is no right-sided SVC. The aorta and pulmonary arteries opacify roughly equally. No arterial embolus is seen. Thoracic aorta is slightly tortuous. There are mild atherosclerotic plaques in the descending aorta and great vessels but no aneurysm, dissection or stenosis. Mediastinum/Nodes: No enlarged mediastinal, hilar, or axillary lymph nodes. Thyroid  gland and trachea demonstrate no significant findings. There is mild diffuse thickening of the esophageal wall but no masslike thickening or interval change in this. Lungs/Pleura: There is a  3 mm left lower lobe nodule on 7:123. Apart from minor posterior atelectasis the remaining lungs are essentially clear. There is mild central bronchial thickening but no bronchial plugs. No pleural effusion, thickening or pneumothorax. There were multifocal airspace infiltrates on the prior study which have resolved. There is mild biapical pleural-parenchymal scarring. Minimal biapical paraseptal emphysema. Musculoskeletal: There is known history of prostate cancer metastatic to bone. There are multifocal sclerotic metastases in the ribs, spine and sternum. There is a new mild bow tie compression fracture of the T4 vertebral body, without retropulsion, almost certainly a pathologic fracture. The metastases are widespread as before but they are less dense than previously. This may suggest treatment response. No new or destructive metastasis is seen. Review of the MIP images confirms the above findings. CTA ABDOMEN AND PELVIS FINDINGS VASCULAR Aorta: Normal caliber aorta without aneurysm, dissection, vasculitis or significant stenosis. There are mild patchy calcific plaques. 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 There is no high-grade stenosis or dissection. No branch vessel occlusions. SMA: Normal. Renals: Both are single. Both are widely patent without hilar branch occlusions. There are minimal nonstenosing calcifications in both proximal renal arteries. IMA: Patent without evidence of aneurysm, dissection, vasculitis or significant stenosis. Inflow: Patent without evidence of aneurysm, dissection, vasculitis or significant stenosis. There are mild calcific plaques in the right common iliac artery, mild-to-moderate calcifications in both internal iliac arteries. The external iliac arteries are largely free of plaque. Veins: No obvious venous abnormality within the limitations of this  arterial phase study. Review of the MIP images confirms the above findings. NON-VASCULAR Hepatobiliary: The liver is 18 cm in length with mild generalized steatosis. There is no mass enhancement. Some images suggest capsular nodularity over portions of the posterior liver which may be seen with early cirrhosis. Gallbladder and bile ducts are unremarkable. Pancreas: No abnormality. Spleen: No abnormality and no splenomegaly. Adrenals/Urinary Tract: There is no adrenal or renal mass enhancement, no urinary stone or obstruction. Once again the bladder is quite thickened even allowing for underdistension which could be due to cystitis, hypertrophy or combination. Stomach/Bowel: Contracted stomach, grossly unremarkable. No small  bowel dilatation or inflammatory change. Normal appendix. Unremarkable large bowel. Lymphatic: No appreciable adenopathy. Reproductive: Prostate significantly smaller in size than previously, 2.8 cm transverse. Both testicles are in the scrotum. Scattered pelvic phleboliths. Other: None. Musculoskeletal: Multifocal widespread bone metastases lumbar spine, pelvis, sacrum. Some of these are less dense than previously, some are denser including sclerotic metastasis in the posteromedial right ilium and in the posterior left ilium. No new or destructive lesion is seen. Review of the MIP images confirms the above findings. IMPRESSION: 1. No acute CTA findings in the chest, abdomen or pelvis. No aortic aneurysm or dissection is seen. 2. Aortic and coronary artery atherosclerosis. 3. 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 No high-grade stenosis or dissection. 4. 3 mm left lower lobe chronic stable nodule. Mild central bronchial thickening. 5. Mild diffuse esophageal wall thickening which could be due to esophagitis. Endoscopy may be indicated. 6. Mild hepatic steatosis. Some images suggest capsular nodularity over portions of the posterior  liver which may be seen with early cirrhosis. 7. Multifocal widespread bone metastases. Some of these are less dense than previously, some are denser including sclerotic metastasis in the posteromedial right ilium and in the posterior left ilium. No new or destructive lesion is seen. 8. New mild bowtie compression fracture of the T4 vertebral body, without retropulsion, almost certainly a pathologic fracture. 9. Significantly smaller prostate than previously. 10. Persistent bladder wall thickening even allowing for underdistension which could be due to cystitis, hypertrophy or combination. 11. Anatomic variant left-sided SVC. 12. Minimal apical emphysema. Aortic Atherosclerosis (ICD10-I70.0) and Emphysema (ICD10-J43.9). Electronically Signed   By: Francis Quam M.D.   On: 11/27/2023 04:27     Procedures   Medications Ordered in the ED  pantoprazole  (PROTONIX ) injection 40 mg (40 mg Intravenous Given 11/28/23 2003)  alum & mag hydroxide-simeth (MAALOX/MYLANTA) 200-200-20 MG/5ML suspension 30 mL (30 mLs Oral Given 11/28/23 1957)    And  lidocaine  (XYLOCAINE ) 2 % viscous mouth solution 15 mL (15 mLs Oral Given 11/28/23 1957)  HYDROmorphone  (DILAUDID ) injection 1 mg (1 mg Intravenous Given 11/28/23 2119)  cefTRIAXone  (ROCEPHIN ) 1 g in sodium chloride  0.9 % 100 mL IVPB (1 g Intravenous New Bag/Given 11/28/23 2311)    Clinical Course as of 11/28/23 2345  Thu Nov 28, 2023  2222 Nitrite(!): POSITIVE [JS]  [address]): SMALL [JS]  2222 Bacteria, UA(!): MANY [JS]  2222 WBC, UA: 6-10 [JS]    Clinical Course User Index [JS] Akeem Heppler, PA-C              HEART Score: 3                Geneva (Revised) Score: 5, Geneva Score Interpretation: Moderate Risk Group: ~20-30% incidence of pulmonary embolism from several studies PERC Score: 1, PERC Score Interpretation: If any criteria are positive, the PERC rule cannot be used to rule out PE in this patient Medical Decision Making Amount and/or  Complexity of Data Reviewed Labs: ordered. Decision-making details documented in ED Course.  Risk OTC drugs. Prescription drug management.   This patient presents to the ED for concern of chest pain abdominal pain, this involves a number of treatment options, and is a complaint that carries with it a high risk of complications and morbidity.  The differential diagnosis includes    Co morbidities: Discussed in HPI   Brief History:  See HPI.   EMR reviewed including pt PMHx, past surgical history and past  visits to ER.   See HPI for more details   Lab Tests:  I ordered and independently interpreted labs.  The pertinent results include:    Labs notable for CBC with no leukocytosis, hemoglobin slightly decreased.  Hepatic function is within normal limits with no elevation of his LFTs.  BM P with no electrolyte derangement, creatinine levels unremarkable.  UA does have small leukocytes, many bacteria, positive nitrites likely consistent with urinary tract infection at this time.   Imaging Studies:  No imaging studies ordered for this patient Upon review of chart, I do see patient's visit yesterday where he obtained a CT angio chest abdomen and pelvis to rule out dissection which was within normal limits.  Medicines ordered:  I ordered medication including dilaudid , maalox, lido  for symptomatic treatment Reevaluation of the patient after these medicines showed that the patient stayed the same I have reviewed the patients home medicines and have made adjustments as needed  Critical Interventions:   after urinalysis consistent with likely urinary tract infection, given 1 g of Rocephin .  Reevaluation:  After the interventions noted above I re-evaluated patient and found that they have :improved  Social Determinants of Health:  The patient's social determinants of health were a factor in the care of this patient   Problem List / ED Course:  Patient presented to the ED  with a chief complaint of chest pain, abdominal pain that is shooting to between his shoulder blades it has been ongoing for some time.  Was evaluated yesterday at [facility], had a CT angio chest abdomen and pelvis to rule out dissection which was negative.  Today he reports that the pain has now worsened.  He is having sharp stabbing pain that goes in between his shoulder blades.  Does hurt worse whenever he takes any deep inspiration. Labs here with a CBC without a leukocytosis, hemoglobin is within normal limits.  BMP with no electrolyte derangement, creatinine levels unremarkable.  Hepatic function is within normal limits with no elevation of his LFTs.  Troponin x 2 have been flat.  CK is within normal limits.  Patient does have a Foley prior history of prostate cancer with metastatic disease which she did not know about until we went over his CT today.  UA does have many bacteria, 6-10 white blood cell count, small leuks, but nitrite positive.  Given 1 g of Rocephin  while in the emergency department. Continues to complain of pain, CT yesterday did reveal some inflammation to his esophagus therefore given Protonix , Maalox to help with symptoms without much improvement.  Therefore, given Dilaudid  which did help with some of his pain. I do not feel that patient needs rescanning of his chest abdomen and pelvis to rule out dissection was negative yesterday.  Discussed this with patient at length.  I discussed this patient and his care with my attending Dr. Dean who agrees with not reimaging patient at this time.  I discussed findings with patient, he is hemodynamically stable after receiving antibiotics.  Return precautions discussed at length.  Patient medically stable for discharge.   Dispostion:  After consideration of the diagnostic results and the patients response to treatment, I feel that the patent would benefit from completion of therapy in order to treat his urinary tract infection.  Will need  to follow-up with [facility].     Final diagnoses:  Generalized pain  Acute cystitis with hematuria    ED Discharge Orders  Ordered    cephALEXin  (KEFLEX ) 500 MG capsule  2 times daily        11/28/23 2315    HYDROcodone -acetaminophen  (NORCO/VICODIN) 5-325 MG tablet  Every 4 hours PRN        11/28/23 2326               Analyn Matusek, PA-C 11/28/23 2345    Haviland, Julie, MD 12/05/23 [phone]

## 2023-11-28 NOTE — Telephone Encounter (Signed)
 Patient is coming in for lab to be drawn for cholesterol

## 2023-11-29 ENCOUNTER — Ambulatory Visit: Payer: Self-pay | Admitting: Family

## 2023-11-29 DIAGNOSIS — E785 Hyperlipidemia, unspecified: Secondary | ICD-10-CM

## 2023-11-29 LAB — LIPID PANEL
Chol/HDL Ratio: 3.9 ratio (ref 0.0–5.0)
Cholesterol, Total: 327 mg/dL — ABNORMAL HIGH (ref 100–199)
HDL: 83 mg/dL (ref >39)
LDL Chol Calc (NIH): 215 mg/dL — ABNORMAL HIGH (ref 0–99)
Triglycerides: 157 mg/dL — ABNORMAL HIGH (ref 0–149)
VLDL Cholesterol Cal: 29 mg/dL (ref 5–40)

## 2023-11-29 MED ORDER — ATORVASTATIN CALCIUM 20 MG PO TABS: 20.0000 mg | ORAL_TABLET | Freq: Every day | ORAL | 0 refills | Status: DC

## 2023-12-02 LAB — URINE CULTURE: Culture: >=100000 — AB

## 2023-12-03 ENCOUNTER — Telehealth (HOSPITAL_BASED_OUTPATIENT_CLINIC_OR_DEPARTMENT_OTHER): Payer: Self-pay

## 2023-12-03 NOTE — Telephone Encounter (Signed)
 Post ED Visit - Positive Culture Follow-up: Unsuccessful Patient Follow-up  Culture assessed and recommendations reviewed by:  []  [name], Pharm.D. []  [name], Pharm.D., BCPS AQ-ID []  [name], Pharm.D., BCPS [x]  [name], [address].D., BCPS []  [place], [address].D., BCPS, AAHIVP []  [name], Pharm.D., BCPS, AAHIVP []  Massie Rigg, PharmD []  Jodie Rower, PharmD, BCPS  Positive urine culture  Reviewed by ED provider - [name], PA-C  []  Patient discharged without antimicrobial prescription and treatment is now indicated []  Organism is resistant to prescribed ED discharge antimicrobial []  Patient with positive blood cultures   Plan - call pt to stop taking Cephalexin , no antibiotics needed. Reviewed by ED provider [name], PA-C   Unable to contact patient after 3 attempts, letter will be sent to address on file  David Townsend 12/03/2023, 10:19 AM

## 2023-12-03 NOTE — Telephone Encounter (Signed)
 Post ED Visit - Positive Culture Follow-up  Culture report reviewed by antimicrobial stewardship pharmacist: [facility] []  [name], Pharm.D. []  [name], Pharm.D., BCPS AQ-ID []  [name], Pharm.D., BCPS [x]  [name], [address].D., BCPS []  [name], [place].D., BCPS, AAHIVP []  [name], Pharm.D., BCPS, AAHIVP []  [name], PharmD, BCPS []  [name], PharmD, BCPS []  [name], PharmD, BCPS []  [name], PharmD []  [name], PharmD, BCPS []  [name], PharmD  [name] []  [name], PharmD []  [name], PharmD []  [name], PharmD []  [name], Rph []  [name], PharmD []  [name], PharmD []  [name], PharmD []  [name], PharmD []  [name], PharmD []  [name], PharmD []  [name], PharmD []  [name], PharmD []  [name], PharmD   Positive urine culture Reviewed by ED provider - [name], PA-C  89 YOM with metastatic prostate CA (bone mets) who presented with CP/midsternal pain, cardiac work-up benign, neg for PE/dissection - deemed to be related to esophagitis and prescribed PPI. Represented with back pain - worse between shoulder blades. Work-up this time included UA given chronic foley and treatment started based off of nitrite +. UA otherwise benign given WBC only 6-10 and noted likely colonization with chronic foley. No treatment indicated at this time.   Plan - Call pt to d/c cephalexin , no additional antibiotic treatment warranted at this time.   Pt called back, instructed pt to stop taking Cephalexin .   Spoke with pt on 12/03/2023 at 11:30 am     David Townsend 12/03/2023, 11:33 AM

## 2023-12-03 NOTE — Progress Notes (Signed)
 ED Antimicrobial Stewardship Positive Culture Follow Up   David Townsend is an 63 y.o. male who presented to [facility] on 11/28/2023 with a chief complaint of  Chief Complaint  Patient presents with   Back Pain   Neck Pain    Recent Results (from the past 720 hours)  Urine Culture     Status: Abnormal   Collection Time: 11/28/23  9:38 PM   Specimen: Urine, Clean Catch  Result Value Ref Range Status   Specimen Description   Final    URINE, CLEAN CATCH Performed at [facility], [address] [place], [place] [address]    Special Requests   Final    NONE Performed at [facility], [address], [place], [place] [address]    Culture (A)  Final    >=100,000 COLONIES/mL KLEBSIELLA PNEUMONIAE >=100,000 COLONIES/mL PSEUDOMONAS AERUGINOSA    Report Status 12/02/2023 FINAL  Final   Organism ID, Bacteria PSEUDOMONAS AERUGINOSA (A)  Final   Organism ID, Bacteria KLEBSIELLA PNEUMONIAE (A)  Final      Susceptibility   Klebsiella pneumoniae - MIC*    AMPICILLIN  >=32 RESISTANT Resistant     CEFAZOLIN  <=4 SENSITIVE Sensitive     CEFEPIME  <=0.12 SENSITIVE Sensitive     CEFTRIAXONE  <=0.25 SENSITIVE Sensitive     CIPROFLOXACIN  <=0.25 SENSITIVE Sensitive     GENTAMICIN <=1 SENSITIVE Sensitive     IMIPENEM 1 SENSITIVE Sensitive     NITROFURANTOIN  128 RESISTANT Resistant     TRIMETH /SULFA  <=20 SENSITIVE Sensitive     AMPICILLIN /SULBACTAM 8 SENSITIVE Sensitive     PIP/TAZO <=4 SENSITIVE Sensitive ug/mL    * >=100,000 COLONIES/mL KLEBSIELLA PNEUMONIAE   Pseudomonas aeruginosa - MIC*    CEFTAZIDIME 4 SENSITIVE Sensitive     CIPROFLOXACIN  <=0.25 SENSITIVE Sensitive     GENTAMICIN 2 SENSITIVE Sensitive     IMIPENEM 2 SENSITIVE Sensitive     PIP/TAZO <=4 SENSITIVE Sensitive ug/mL    CEFEPIME  2 SENSITIVE Sensitive     * >=100,000 COLONIES/mL PSEUDOMONAS AERUGINOSA    [x]  No treatment indicated - one organism sensitive and other resistant to prescribed  antimicrobial  62 YOM with metastatic prostate CA (bone mets) who presented with CP/midsternal pain, cardiac work-up benign, neg for PE/dissection - deemed to be related to esophagitis and prescribed PPI. Represented with back pain - worse between shoulder blades. Work-up this time included UA given chronic foley and treatment started based off of nitrite +. UA otherwise benign given WBC only 6-10 and noted likely colonization with chronic foley. No treatment indicated at this time.   Call the patient to discontinue cephalexin  - no additional antibiotic treatment warranted at this time  ED Provider: [name], PA-C  Thank you for allowing pharmacy to be a part of this patient's care.  [name], PharmD, BCPS, BCIDP Infectious Diseases Clinical Pharmacist 12/03/2023 8:55 AM   **Pharmacist phone directory can now be found on [email or web address] ([email or web address]).  Listed under [facility].

## 2023-12-04 ENCOUNTER — Encounter: Payer: Self-pay | Admitting: Family

## 2023-12-04 ENCOUNTER — Ambulatory Visit (INDEPENDENT_AMBULATORY_CARE_PROVIDER_SITE_OTHER): Admitting: Family

## 2023-12-04 VITALS — BP 146/86 | HR 74 | Temp 98.0°F | Resp 16 | Ht 70.0 in | Wt 157.4 lb

## 2023-12-04 DIAGNOSIS — E785 Hyperlipidemia, unspecified: Secondary | ICD-10-CM | POA: Diagnosis not present

## 2023-12-04 DIAGNOSIS — N3001 Acute cystitis with hematuria: Secondary | ICD-10-CM | POA: Diagnosis not present

## 2023-12-04 DIAGNOSIS — R52 Pain, unspecified: Secondary | ICD-10-CM | POA: Diagnosis not present

## 2023-12-04 DIAGNOSIS — D649 Anemia, unspecified: Secondary | ICD-10-CM

## 2023-12-04 DIAGNOSIS — E871 Hypo-osmolality and hyponatremia: Secondary | ICD-10-CM

## 2023-12-04 DIAGNOSIS — K219 Gastro-esophageal reflux disease without esophagitis: Secondary | ICD-10-CM

## 2023-12-04 DIAGNOSIS — I10 Essential (primary) hypertension: Secondary | ICD-10-CM | POA: Diagnosis not present

## 2023-12-04 MED ORDER — ATORVASTATIN CALCIUM 20 MG PO TABS: 20.0000 mg | ORAL_TABLET | Freq: Every day | ORAL | 0 refills | Status: DC

## 2023-12-04 MED ORDER — VALSARTAN 40 MG PO TABS: 40.0000 mg | ORAL_TABLET | Freq: Every day | ORAL | 0 refills | Status: DC

## 2023-12-04 MED ORDER — AMLODIPINE BESYLATE 10 MG PO TABS: 10.0000 mg | ORAL_TABLET | Freq: Every day | ORAL | 0 refills | Status: DC

## 2023-12-04 NOTE — Progress Notes (Signed)
 Patient is here for HFU . Patient said there are no other concerns

## 2023-12-04 NOTE — Progress Notes (Signed)
 Patient ID: David Townsend, male    DOB: [birth year]  MRN: [id number]  CC: Emergency Department Follow-Up  Subjective: David Townsend is a 63 y.o. male who presents for Emergency Department follow-up.   His concerns today include:  - Patient seen on 11/27/2023 at [facility] Emergency Department at [facility] for GERD, hyponatremia, and macrocytic anemia. He is feeling improved. Denies red flag symptoms. He is doing well on medication regimen, no issues/concerns.  - Patient seen on 11/28/2023 - 11/29/2023 at [facility] Emergency Department at [facility] for generalized pain and acute cystitis with hematuria. He is feeling improved. Denies red flag symptoms. He is doing well on medication regimen, no issues/concerns. States he has an upcoming appointment with Urology. - Doing well on Amlodipine , no issues/concerns. He does not complain of red flag symptoms such as but not limited to chest pain, shortness of breath, worst headache of life, nausea/vomiting.  - Doing well on Atorvastatin , no issues/concerns.  Patient Active Problem List   Diagnosis Date Noted   Lobar pneumonia, unspecified organism (HCC) 09/28/2022   ARDS (adult respiratory distress syndrome) (HCC) 09/28/2022   Rhabdomyolysis 09/19/2022   Hyponatremia 09/19/2022   Sepsis (HCC) 09/19/2022   Prostate cancer metastatic to bone (HCC) 04/23/2022   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   Tobacco abuse 04/23/2022   Essential hypertension 04/23/2022   Urinary retention 04/23/2022   Hyperlipidemia 03/27/2022   ETOH abuse 11/01/2015     Current Outpatient Medications on File Prior to Visit  Medication Sig Dispense Refill   amLODipine  (NORVASC ) 10 MG tablet Take 1 tablet (10 mg total) by mouth daily. 90 tablet 0   Ascorbic Acid (VITAMIN C PO) Take 1 tablet by mouth daily.     cephALEXin  (KEFLEX ) 500 MG capsule Take 1 capsule (500 mg total) by mouth 2 (two) times daily for  7 days. 14 capsule 0   ERLEADA  60 MG tablet Take 120 mg by mouth daily.     folic acid  (FOLVITE ) 1 MG tablet Take 1 tablet (1 mg total) by mouth daily.     furosemide  (LASIX ) 20 MG tablet Take 1 tablet (20 mg total) by mouth daily. (Patient not taking: Reported on 01/31/2023) 3 tablet 0   ibuprofen  (ADVIL ) 800 MG tablet Take 1 tablet (800 mg total) by mouth every 8 (eight) hours as needed for fever, mild pain or headache (alternate with tylenol  for fever). (Patient not taking: Reported on 01/31/2023) 30 tablet 0   Iron , Ferrous Sulfate , 325 (65 Fe) MG TABS Take 325 mg by mouth daily. 90 tablet 0   lidocaine  (LIDODERM ) 5 % Place 1 patch onto the skin daily. Remove & Discard patch within 12 hours or as directed by MD (Patient not taking: Reported on 01/31/2023) 30 patch 0   Multiple Vitamin (MULTIVITAMIN WITH MINERALS) TABS tablet Take 1 tablet by mouth daily.     nicotine  (NICODERM CQ  - DOSED IN MG/24 HOURS) 21 mg/24hr patch Place 1 patch (21 mg total) onto the skin daily. (Patient not taking: Reported on 01/31/2023) 28 patch 0   pantoprazole  (PROTONIX ) 40 MG tablet Take 1 tablet (40 mg total) by mouth daily. 30 tablet 0   senna-docusate (SENOKOT-S) 8.6-50 MG tablet Take 1 tablet by mouth 2 (two) times daily. (Patient not taking: Reported on 01/31/2023)     thiamine  (VITAMIN B-1) 100 MG tablet Take 1 tablet (100 mg total) by mouth daily. (Patient not taking: Reported on 01/31/2023)  VITAMIN D  PO Take 1 capsule by mouth daily.     No current facility-administered medications on file prior to visit.    No Known Allergies  Social History   Socioeconomic History   Marital status: Single    Spouse name: n/a   Number of children: 0   Years of education: 12+   Highest education level: Not on file  Occupational History   Occupation: golf guy at [employer]  Tobacco Use   Smoking status: Every Day    Current packs/day: 2.00    Average packs/day: 2.0 packs/day for 34.0 years (68.0 ttl  pk-yrs)    Types: Cigarettes    Passive exposure: Current   Smokeless tobacco: Never  Vaping Use   Vaping status: Never Used  Substance and Sexual Activity   Alcohol use: Yes    Alcohol/week: 7.0 standard drinks of alcohol    Types: 4 Cans of beer, 3 Shots of liquor per week    Comment: 5-6 beers per day   Drug use: No   Sexual activity: Not on file  Other Topics Concern   Not on file  Social History Narrative   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   Lives alone.   Golf Pro   Social Drivers of Health   Financial Resource Strain: Not on file  Food Insecurity: No Food Insecurity (09/21/2022)   Hunger Vital Sign    Worried About Running Out of Food in the Last Year: Never true    Ran Out of Food in the Last Year: Never true  Transportation Needs: No Transportation Needs (09/21/2022)   PRAPARE - Administrator, Civil Service (Medical): No    Lack of Transportation (Non-Medical): No  Physical Activity: Not on file  Stress: Not on file  Social Connections: Not on file  Intimate Partner Violence: Not At Risk (09/21/2022)   Humiliation, Afraid, Rape, and Kick questionnaire    Fear of Current or Ex-Partner: No    Emotionally Abused: No    Physically Abused: No    Sexually Abused: No    Family History  Family history unknown: Yes    Past Surgical History:  Procedure Laterality Date   NO PAST SURGERIES     PENILE BIOPSY N/A 01/12/2020   Procedure: EXCISION OF PENILE LESION;  Surgeon: Elisabeth Valli BIRCH, MD;  Location: [facility];  Service: Urology;  Laterality: N/A;    ROS: Review of Systems Negative except as stated above  PHYSICAL EXAM: BP (!) 146/86   Pulse 74   Temp 98 F (36.7 C) (Oral)   Resp 16   Ht 5' 10 (1.778 m)   Wt 157 lb 6.4 oz (71.4 kg)   SpO2 97%   BMI 22.58 kg/m   Physical Exam HENT:     Head: Normocephalic and atraumatic.     Nose: Nose normal.     Mouth/Throat:     Mouth: Mucous membranes are  moist.     Pharynx: Oropharynx is clear.  Eyes:     Extraocular Movements: Extraocular movements intact.     Conjunctiva/sclera: Conjunctivae normal.     Pupils: Pupils are equal, round, and reactive to light.  Cardiovascular:     Rate and Rhythm: Normal rate and regular rhythm.     Pulses: Normal pulses.     Heart sounds: Normal heart sounds.  Pulmonary:     Effort: Pulmonary effort is normal.     Breath sounds:  Normal breath sounds.  Abdominal:     General: Bowel sounds are normal.     Palpations: Abdomen is soft.  Musculoskeletal:        General: Normal range of motion.     Cervical back: Normal range of motion and neck supple.  Neurological:     General: No focal deficit present.     Mental Status: He is alert and oriented to person, place, and time.  Psychiatric:        Mood and Affect: Mood normal.        Behavior: Behavior normal.      ASSESSMENT AND PLAN: 1. Generalized pain (Primary) 2. Acute cystitis with hematuria - Keep all scheduled appointments with Urology.   3. Primary hypertension - Blood pressure not at goal during today's visit. Patient asymptomatic without chest pressure, chest pain, palpitations, shortness of breath, worst headache of life, and any additional red flag symptoms. - Continue Amlodipine  as prescribed.  - Trial Valsartan as prescribed.  - Counseled on blood pressure goal of less than 130/80, low-sodium, DASH diet, medication compliance, and 150 minutes of moderate intensity exercise per week as tolerated. Counseled on medication adherence and adverse effects. - Follow-up with primary provider in 4 weeks or sooner if needed.  - amLODipine  (NORVASC ) 10 MG tablet; Take 1 tablet (10 mg total) by mouth daily.  Dispense: 90 tablet; Refill: 0 - valsartan (DIOVAN) 40 MG tablet; Take 1 tablet (40 mg total) by mouth daily.  Dispense: 90 tablet; Refill: 0  4. Hyperlipidemia, unspecified hyperlipidemia type - Continue Atorvastatin  as prescribed.  Counseled on medication adherence/adverse effects.  - Follow-up with primary provider in 4 weeks or sooner if needed.  - atorvastatin  (LIPITOR) 20 MG tablet; Take 1 tablet (20 mg total) by mouth daily.  Dispense: 90 tablet; Refill: 0  5. Gastroesophageal reflux disease, unspecified whether esophagitis present - Continue Pantoprazole  as prescribed. Counseled on medication adherence/adverse effects. No refills needed as of present.  - Follow-up with primary provider as scheduled.  6. Hyponatremia - Sodium mildly low at 134 on 11/28/2023. Normal sodium 135.  7. Anemia, unspecified type - Continue Ferrous Sulfate  as prescribed. Counseled on medication adherence/adverse effects. No refills needed as of present. - Follow-up with primary provider as scheduled.   Patient was given the opportunity to ask questions.  Patient verbalized understanding of the plan and was able to repeat key elements of the plan. Patient was given clear instructions to go to Emergency Department or return to medical center if symptoms don't improve, worsen, or new problems develop.The patient verbalized understanding.   Requested Prescriptions   Signed Prescriptions Disp Refills   amLODipine  (NORVASC ) 10 MG tablet 90 tablet 0    Sig: Take 1 tablet (10 mg total) by mouth daily.   atorvastatin  (LIPITOR) 20 MG tablet 90 tablet 0    Sig: Take 1 tablet (20 mg total) by mouth daily.   valsartan (DIOVAN) 40 MG tablet 90 tablet 0    Sig: Take 1 tablet (40 mg total) by mouth daily.    Return in about 4 weeks (around 01/01/2024) for Follow-Up or next available chronic conditions.  Greig JINNY Drones, NP

## 2024-01-07 ENCOUNTER — Ambulatory Visit: Admitting: Family

## 2024-03-22 ENCOUNTER — Other Ambulatory Visit: Payer: Self-pay

## 2024-03-22 ENCOUNTER — Emergency Department (HOSPITAL_COMMUNITY)
Admission: EM | Admit: 2024-03-22 | Discharge: 2024-03-22 | Disposition: A | Discharge status: Home / Self Care | Attending: Emergency Medicine | Admitting: Emergency Medicine

## 2024-03-22 DIAGNOSIS — M549 Dorsalgia, unspecified: Secondary | ICD-10-CM | POA: Diagnosis present

## 2024-03-22 DIAGNOSIS — Z8546 Personal history of malignant neoplasm of prostate: Secondary | ICD-10-CM | POA: Diagnosis not present

## 2024-03-22 DIAGNOSIS — C61 Malignant neoplasm of prostate: Secondary | ICD-10-CM

## 2024-03-22 DIAGNOSIS — Z79899 Other long term (current) drug therapy: Secondary | ICD-10-CM | POA: Diagnosis not present

## 2024-03-22 DIAGNOSIS — C7951 Secondary malignant neoplasm of bone: Secondary | ICD-10-CM | POA: Diagnosis not present

## 2024-03-22 DIAGNOSIS — G8929 Other chronic pain: Secondary | ICD-10-CM | POA: Diagnosis not present

## 2024-03-22 DIAGNOSIS — I10 Essential (primary) hypertension: Secondary | ICD-10-CM | POA: Diagnosis not present

## 2024-03-22 MED ORDER — OXYCODONE HCL 5 MG PO TABS: 5.0000 mg | ORAL_TABLET | ORAL | 0 refills | Status: DC | PRN

## 2024-03-22 MED ORDER — OXYCODONE HCL 5 MG PO TABS
10.0000 mg | ORAL_TABLET | Freq: Once | ORAL | Status: AC
  Administered 2024-03-22: 10 mg via ORAL
  Filled 2024-03-22: qty 2

## 2024-03-22 NOTE — ED Provider Notes (Signed)
 [place] EMERGENCY DEPARTMENT AT [facility] Provider Note   CSN: [id number] Arrival date & time: 03/22/24  1622     Patient presents with: Back Pain   David Townsend is a 62 y.o. male.   63 year old male with past medical history of metastatic prostate cancer, hypertension, hyperlipidemia presents with complaint of pain.  Patient states that he has had pain in his back, ribs, side intermittent, varying in location for the past 4 months.  Patient was seen here back in June with this, had broad workup and was referred back to his care team.  He states that he was told to take ibuprofen  and Tylenol .  He notes taking 400 mg of ibuprofen  with 1300 mg of Tylenol  with very limited relief.  He reports having a bottle of liquid morphine  from his dad's end-of-life care and takes this on rare occasion when pain is severe.  He is almost out of this.  He denies any new or changes in symptoms.  He denies fevers, changes in bowel or bladder habits.  No other complaints or concerns.       Prior to Admission medications   Medication Sig Start Date End Date Taking? Authorizing Provider  oxyCODONE  (ROXICODONE ) 5 MG immediate release tablet Take 1 tablet (5 mg total) by mouth every 4 (four) hours as needed for severe pain (pain score 7-10). 03/22/24  Yes Beverley Leita LABOR, PA-C  amLODipine  (NORVASC ) 10 MG tablet Take 1 tablet (10 mg total) by mouth daily. 11/27/23   Lorren Greig PARAS, NP  amLODipine  (NORVASC ) 10 MG tablet Take 1 tablet (10 mg total) by mouth daily. 12/04/23 03/03/24  Lorren Greig PARAS, NP  Ascorbic Acid (VITAMIN C PO) Take 1 tablet by mouth daily.    [provider]  atorvastatin  (LIPITOR) 20 MG tablet Take 1 tablet (20 mg total) by mouth daily. 12/04/23   Lorren Greig PARAS, NP  ERLEADA  60 MG tablet Take 120 mg by mouth daily. 02/20/22   [provider]  folic acid  (FOLVITE ) 1 MG tablet Take 1 tablet (1 mg total) by mouth daily. 10/04/22   Jillian Buttery, MD  furosemide   (LASIX ) 20 MG tablet Take 1 tablet (20 mg total) by mouth daily. Patient not taking: Reported on 01/31/2023 10/11/22   Lorren Greig PARAS, NP  ibuprofen  (ADVIL ) 800 MG tablet Take 1 tablet (800 mg total) by mouth every 8 (eight) hours as needed for fever, mild pain or headache (alternate with tylenol  for fever). Patient not taking: Reported on 01/31/2023 10/03/22   Jillian Buttery, MD  Iron , Ferrous Sulfate , 325 (65 Fe) MG TABS Take 325 mg by mouth daily. 11/27/23   Lorren Greig PARAS, NP  lidocaine  (LIDODERM ) 5 % Place 1 patch onto the skin daily. Remove & Discard patch within 12 hours or as directed by MD Patient not taking: Reported on 01/31/2023 10/03/22   Adhikari, Amrit, MD  Multiple Vitamin (MULTIVITAMIN WITH MINERALS) TABS tablet Take 1 tablet by mouth daily. 10/04/22   Jillian Buttery, MD  nicotine  (NICODERM CQ  - DOSED IN MG/24 HOURS) 21 mg/24hr patch Place 1 patch (21 mg total) onto the skin daily. Patient not taking: Reported on 01/31/2023 10/04/22   Jillian Buttery, MD  pantoprazole  (PROTONIX ) 40 MG tablet Take 1 tablet (40 mg total) by mouth daily. 11/27/23   Raford Lenis, MD  senna-docusate (SENOKOT-S) 8.6-50 MG tablet Take 1 tablet by mouth 2 (two) times daily. Patient not taking: Reported on 01/31/2023 10/03/22   Jillian Buttery, MD  thiamine  (VITAMIN  B-1) 100 MG tablet Take 1 tablet (100 mg total) by mouth daily. Patient not taking: Reported on 01/31/2023 10/04/22   Jillian Buttery, MD  valsartan  (DIOVAN ) 40 MG tablet Take 1 tablet (40 mg total) by mouth daily. 12/04/23   Lorren Greig PARAS, NP  VITAMIN D  PO Take 1 capsule by mouth daily.    [provider]    Allergies: Patient has no known allergies.    Review of Systems Negative except as per HPI Updated Vital Signs BP (!) 158/91   Pulse 81   Temp 97.9 F (36.6 C) (Oral)   Resp 16   Ht 5' 10 (1.778 m)   Wt 70.3 kg   SpO2 96%   BMI 22.24 kg/m   Physical Exam Vitals and nursing note reviewed.  Constitutional:      General: He is not  in acute distress.    Appearance: He is well-developed. He is not diaphoretic.  HENT:     Head: Normocephalic and atraumatic.     Mouth/Throat:     Mouth: Mucous membranes are moist.  Eyes:     Conjunctiva/sclera: Conjunctivae normal.  Cardiovascular:     Pulses: Normal pulses.  Pulmonary:     Effort: Pulmonary effort is normal.  Abdominal:     Palpations: Abdomen is soft.     Tenderness: There is no abdominal tenderness.  Musculoskeletal:        General: No swelling, tenderness or deformity. Normal range of motion.  Skin:    General: Skin is warm and dry.     Findings: No erythema or rash.  Neurological:     Mental Status: He is alert and oriented to person, place, and time.     Sensory: No sensory deficit.     Motor: No weakness.  Psychiatric:        Behavior: Behavior normal.     (all labs ordered are listed, but only abnormal results are displayed) Labs Reviewed - No data to display  EKG: None  Radiology: No results found.   Procedures   Medications Ordered in the ED  oxyCODONE  (Oxy IR/ROXICODONE ) immediate release tablet 10 mg (10 mg Oral Given 03/22/24 1903)                                    Medical Decision Making Risk Prescription drug management.   63 year old male presents with complaint of pain in his back and ribs, varying in location over the past 4 months.  He has known metastatic prostate cancer.  He was last evaluated for similar pain back in June of this year.  CT reviewed from that visit.  Patient followed up with his care team afterwards and was advised to take Motrin  and Tylenol .  He reports taking 1300 mg of Tylenol  and 400 mg of ibuprofen  with limited relief of his pain.  He has a very small amount of liquid morphine  leftover from his father's end-of-life care, and has taken this on occasion with better improvement in his pain control.  I have reviewed the controlled substance database, was provided with 5 tablets of narcotic pain medication  back in June, no additional medications since that time.  That CT did suggest a T4 pathologic fracture.  He does not have midline tenderness today.  Discussed with patient, offered further evaluation to include imaging and labs.  As this is the exact same pain patient has been experiencing for months without  acute changes in his pain, he is agreeable with pain medication at this time, declines further workup.  He is provided with prescription for oxycodone  and pain medication department today.  He can continue with his Tylenol  and ibuprofen  and follow-up with his care team.  Discussed palliative care options with patient and suggest he discuss palliative care referral with his care team.     Final diagnoses:  Chronic back pain, unspecified back location, unspecified back pain laterality  Prostate cancer metastatic to bone [facility])    ED Discharge Orders          Ordered    oxyCODONE  (ROXICODONE ) 5 MG immediate release tablet  Every 4 hours PRN        03/22/24 1912               Beverley Leita LABOR, PA-C 03/22/24 1942    Horton, Roxie HERO, DO 03/22/24 2343

## 2024-03-22 NOTE — Discharge Instructions (Addendum)
 Please talk to your care team about palliative care referral for pain and symptom management.  Take oxycodone  as prescribed. Do NOT drive if taking this medicine. This medicine can cause constipation. Manage with miralax. This medication cannot be refilled in the ER, you will need to obtain additional medication from your primary care provider or care team.  You can continue with your tylenol /acetaminophen  and motrin /ibuprofen .

## 2024-03-22 NOTE — ED Triage Notes (Signed)
 Patient to ED by POV with c/o chronic back pain. He has pain in upper, mid and lower back reports pain is from prostate cancer that has spread to his bones. Oncologist told him to take Tylenol , he denies relief.

## 2024-03-23 ENCOUNTER — Ambulatory Visit: Payer: Self-pay

## 2024-03-23 NOTE — Telephone Encounter (Addendum)
 Pt asking if he can get a stronger prescription of pain medications. Pt does not feel that 5 mg oxycodone  will be enough. Pt states that at the ED they gave him 2 little pills, current rx is one 5 mg tab every 4 hours. Please call pt back with PCP decision.

## 2024-03-23 NOTE — Telephone Encounter (Signed)
 FYI Only or Action Required?:    oxyCODONE  (ROXICODONE ) 5 MG immediate release tablet  Every 4 hours PRN           Patient was last seen in primary care on 12/04/2023 by Lorren Greig PARAS, NP.  Called Nurse Triage reporting Back Pain and Medication Problem.  Symptoms began Ongoing  - recently much worse.  Interventions attempted: Other: ED.  Symptoms are: unchanged.  Triage Disposition: Call PCP Now  Patient/caregiver understands and will follow disposition?: No, wishes to speak with PCP - Pt was only given a few    oxyCODONE  (ROXICODONE ) 5 MG immediate release tablet  Every 4 hours PRN         For pain. Pt would like a call back regarding medications.          Copied from CRM #[id number]. Topic: Clinical - Red Word Triage >> Mar 23, 2024 12:34 PM Tinnie BROCKS wrote: Red Word that prompted transfer to Nurse Triage: His back is hurting very badly and he has prostate cancer. Worried its spread to his bones. Could hardly walk this morning so he took 1300 mg tylenol  and 300 mg ibuprofen . Went to ER last night because he couldn't take the pain anymore. They prescribed oxycodone . He wants to know if he can take oxycodone  now after he took those meds this morning (states its been 5.5 hrs). He will need to schedule hospital follow up (discharged yesterday) but I have not mentioned that to him yet since he is still having severe back pain. Reason for Disposition  [1] Pharmacy calling with prescription question AND [2] triager unable to answer question  Answer Assessment - Initial Assessment Questions 1. NAME of MEDICINE: What medicine(s) are you calling about?     Tylenol , IBU and    oxyCODONE  (ROXICODONE ) 5 MG immediate release tablet  Every 4 hours PRN      2. QUESTION: What is your question? (e.g., double dose of medicine, side effect)     Can pt take all 3 together 3. PRESCRIBER: Who prescribed the medicine? Reason: if prescribed by specialist, call should be referred to that group.      ED prescribed    oxyCODONE  (ROXICODONE ) 5 MG immediate release tablet  Every 4 hours PRN      4. SYMPTOMS: Do you have any symptoms? If Yes, ask: What symptoms are you having?  How bad are the symptoms (e.g., mild, moderate, severe)     Extreme back pain  Protocols used: Medication Question Call-A-AH

## 2024-03-23 NOTE — Telephone Encounter (Signed)
 I am unable to prescribe Oxycodone  due to [facility] office policy. Report to Emergency Department/Urgent Care/call 911 for immediate medical evaluation. Follow-up with Primary Care.

## 2024-03-24 NOTE — Telephone Encounter (Signed)
 Patient calling in asking for an update on for a request for something stronger than oxycodone . Patient is aware of PCP's note that she can not prescribe oxycodone . Patient still complaining of severe back pain. Patient states it is difficult to get a hold of his urologist but he will try reaching out to him today. He denies having an oncologist. Advised ED/urgent care/call 911. He states he has gone to the ED twice and it was a waste of time. Declined ED/urgent care/calling 911. Patient asked if he could take 2 of his 5mg  oxycodone , RN advised against and to take as directed. Patient advised on how frequent he can take ibuprofen  and Tylenol  and maximum dosage for 24 hour period. Called [facility] and notified staff of ED refusal.

## 2024-03-24 NOTE — Telephone Encounter (Signed)
 I called patient and made him aware of PCP recommendations and he stated he was going to make an appointment with his cancer md

## 2024-03-27 ENCOUNTER — Emergency Department (HOSPITAL_BASED_OUTPATIENT_CLINIC_OR_DEPARTMENT_OTHER)

## 2024-03-27 ENCOUNTER — Other Ambulatory Visit: Payer: Self-pay

## 2024-03-27 ENCOUNTER — Emergency Department (HOSPITAL_BASED_OUTPATIENT_CLINIC_OR_DEPARTMENT_OTHER)
Admission: EM | Admit: 2024-03-27 | Discharge: 2024-03-27 | Disposition: A | Discharge status: Home / Self Care | Attending: Emergency Medicine | Admitting: Emergency Medicine

## 2024-03-27 DIAGNOSIS — E871 Hypo-osmolality and hyponatremia: Secondary | ICD-10-CM | POA: Diagnosis not present

## 2024-03-27 DIAGNOSIS — R7401 Elevation of levels of liver transaminase levels: Secondary | ICD-10-CM | POA: Insufficient documentation

## 2024-03-27 DIAGNOSIS — C7951 Secondary malignant neoplasm of bone: Secondary | ICD-10-CM | POA: Insufficient documentation

## 2024-03-27 DIAGNOSIS — G893 Neoplasm related pain (acute) (chronic): Secondary | ICD-10-CM | POA: Insufficient documentation

## 2024-03-27 DIAGNOSIS — M546 Pain in thoracic spine: Secondary | ICD-10-CM | POA: Insufficient documentation

## 2024-03-27 DIAGNOSIS — R748 Abnormal levels of other serum enzymes: Secondary | ICD-10-CM | POA: Insufficient documentation

## 2024-03-27 DIAGNOSIS — C61 Malignant neoplasm of prostate: Secondary | ICD-10-CM | POA: Insufficient documentation

## 2024-03-27 LAB — CBC WITH DIFFERENTIAL/PLATELET
Abs Immature Granulocytes: 0.05 K/uL (ref 0.00–0.07)
Basophils Absolute: 0.1 K/uL (ref 0.0–0.1)
Basophils Relative: 1 %
Eosinophils Absolute: 0.1 K/uL (ref 0.0–0.5)
Eosinophils Relative: 1 %
HCT: 38.9 % — ABNORMAL LOW (ref 39.0–52.0)
Hemoglobin: 13.4 g/dL (ref 13.0–17.0)
Immature Granulocytes: 1 %
Lymphocytes Relative: 17 %
Lymphs Abs: 1.5 K/uL (ref 0.7–4.0)
MCH: 39.2 pg — ABNORMAL HIGH (ref 26.0–34.0)
MCHC: 34.4 g/dL (ref 30.0–36.0)
MCV: 113.7 fL — ABNORMAL HIGH (ref 80.0–100.0)
Monocytes Absolute: 0.7 K/uL (ref 0.1–1.0)
Monocytes Relative: 8 %
Neutro Abs: 6.4 K/uL (ref 1.7–7.7)
Neutrophils Relative %: 72 %
Platelets: 165 K/uL (ref 150–400)
RBC: 3.42 MIL/uL — ABNORMAL LOW (ref 4.22–5.81)
RDW: 13.6 % (ref 11.5–15.5)
WBC: 8.8 K/uL (ref 4.0–10.5)
nRBC: 0 % (ref 0.0–0.2)

## 2024-03-27 LAB — COMPREHENSIVE METABOLIC PANEL WITH GFR
ALT: 16 U/L (ref 0–44)
AST: 42 U/L — ABNORMAL HIGH (ref 15–41)
Albumin: 4.5 g/dL (ref 3.5–5.0)
Alkaline Phosphatase: 228 U/L — ABNORMAL HIGH (ref 38–126)
Anion gap: 15 (ref 5–15)
BUN: 10 mg/dL (ref 8–23)
CO2: 18 mmol/L — ABNORMAL LOW (ref 22–32)
Calcium: 9.9 mg/dL (ref 8.9–10.3)
Chloride: 101 mmol/L (ref 98–111)
Creatinine, Ser: 0.71 mg/dL (ref 0.61–1.24)
GFR, Estimated: >60 mL/min (ref >60)
Glucose, Bld: 93 mg/dL (ref 70–99)
Potassium: 4.6 mmol/L (ref 3.5–5.1)
Sodium: 134 mmol/L — ABNORMAL LOW (ref 135–145)
Total Bilirubin: 0.4 mg/dL (ref 0.0–1.2)
Total Protein: 7.5 g/dL (ref 6.5–8.1)

## 2024-03-27 MED ORDER — KETOROLAC TROMETHAMINE 15 MG/ML IJ SOLN
15.0000 mg | Freq: Once | INTRAMUSCULAR | Status: AC
  Administered 2024-03-27: 15 mg via INTRAVENOUS
  Filled 2024-03-27: qty 1

## 2024-03-27 MED ORDER — CYCLOBENZAPRINE HCL 10 MG PO TABS
10.0000 mg | ORAL_TABLET | Freq: Three times a day (TID) | ORAL | 0 refills | Status: DC | PRN
Start: 2024-03-27 — End: 2024-03-27

## 2024-03-27 MED ORDER — LIDOCAINE 5 % EX PTCH: 1.0000 | MEDICATED_PATCH | CUTANEOUS | 0 refills | Status: DC

## 2024-03-27 MED ORDER — GADOBUTROL 1 MMOL/ML IV SOLN
7.5000 mL | Freq: Once | INTRAVENOUS | Status: AC | PRN
  Administered 2024-03-27: 7.5 mL via INTRAVENOUS

## 2024-03-27 MED ORDER — MORPHINE SULFATE (PF) 4 MG/ML IV SOLN
4.0000 mg | Freq: Once | INTRAVENOUS | Status: AC
  Administered 2024-03-27: 4 mg via INTRAVENOUS
  Filled 2024-03-27: qty 1

## 2024-03-27 MED ORDER — CYCLOBENZAPRINE HCL 10 MG PO TABS
10.0000 mg | ORAL_TABLET | Freq: Once | ORAL | Status: AC
  Administered 2024-03-27: 10 mg via ORAL
  Filled 2024-03-27: qty 1

## 2024-03-27 MED ORDER — HYDROMORPHONE HCL 1 MG/ML IJ SOLN
1.0000 mg | Freq: Once | INTRAMUSCULAR | Status: AC
  Administered 2024-03-27: 1 mg via INTRAVENOUS
  Filled 2024-03-27: qty 1

## 2024-03-27 MED ORDER — LIDOCAINE 5 % EX PTCH
2.0000 | MEDICATED_PATCH | CUTANEOUS | Status: DC
  Administered 2024-03-27: 2 via TRANSDERMAL
  Filled 2024-03-27: qty 2

## 2024-03-27 MED ORDER — CYCLOBENZAPRINE HCL 10 MG PO TABS: 10.0000 mg | ORAL_TABLET | Freq: Three times a day (TID) | ORAL | 0 refills | Status: DC | PRN

## 2024-03-27 NOTE — ED Provider Notes (Signed)
 [place] EMERGENCY DEPARTMENT AT [facility] Provider Note   CSN: [id number] Arrival date & time: 03/27/24  1208     Patient presents with: Back Pain   David Townsend is a 63 y.o. male with history of metastatic prostate cancer to the bone, presents with ongoing upper back pain since this summer.  He reports that pain seems to be worsening particularly over the last couple of weeks.  He denies any numbness or weakness in his legs.  Denies any bowel or bladder incontinence or urinary retention.  Denies any injuries to his back.  Denies any chest pain or shortness of breath.  No fevers or chills at home.  He has been trying Tylenol  and ibuprofen  at home without significant proved in symptoms.  He was seen in the ER on 03/22/2024 and given 10 mg oxycodone  for his cancer pain.  He reports this is not helping much at home.    Back Pain      Prior to Admission medications   Medication Sig Start Date End Date Taking? Authorizing Provider  amLODipine  (NORVASC ) 10 MG tablet Take 1 tablet (10 mg total) by mouth daily. 11/27/23   Lorren Greig PARAS, NP  amLODipine  (NORVASC ) 10 MG tablet Take 1 tablet (10 mg total) by mouth daily. 12/04/23 03/03/24  Lorren Greig PARAS, NP  Ascorbic Acid (VITAMIN C PO) Take 1 tablet by mouth daily.    [provider]  atorvastatin  (LIPITOR) 20 MG tablet Take 1 tablet (20 mg total) by mouth daily. 12/04/23   Lorren Greig PARAS, NP  cyclobenzaprine (FLEXERIL) 10 MG tablet Take 1 tablet (10 mg total) by mouth every 8 (eight) hours as needed for muscle spasms. 03/27/24   Veta Palma, PA-C  ERLEADA  60 MG tablet Take 120 mg by mouth daily. 02/20/22   [provider]  folic acid  (FOLVITE ) 1 MG tablet Take 1 tablet (1 mg total) by mouth daily. 10/04/22   Jillian Buttery, MD  furosemide  (LASIX ) 20 MG tablet Take 1 tablet (20 mg total) by mouth daily. Patient not taking: Reported on 01/31/2023 10/11/22   Lorren Greig PARAS, NP  ibuprofen  (ADVIL ) 800 MG tablet Take  1 tablet (800 mg total) by mouth every 8 (eight) hours as needed for fever, mild pain or headache (alternate with tylenol  for fever). Patient not taking: Reported on 01/31/2023 10/03/22   Jillian Buttery, MD  Iron , Ferrous Sulfate , 325 (65 Fe) MG TABS Take 325 mg by mouth daily. 11/27/23   Lorren Greig PARAS, NP  lidocaine  (LIDODERM ) 5 % Place 1 patch onto the skin daily. Remove & Discard patch within 12 hours or as directed by MD 03/27/24   Veta Palma, PA-C  Multiple Vitamin (MULTIVITAMIN WITH MINERALS) TABS tablet Take 1 tablet by mouth daily. 10/04/22   Jillian Buttery, MD  nicotine  (NICODERM CQ  - DOSED IN MG/24 HOURS) 21 mg/24hr patch Place 1 patch (21 mg total) onto the skin daily. Patient not taking: Reported on 01/31/2023 10/04/22   Jillian Buttery, MD  oxyCODONE  (ROXICODONE ) 5 MG immediate release tablet Take 1 tablet (5 mg total) by mouth every 4 (four) hours as needed for severe pain (pain score 7-10). 03/22/24   Beverley Leita LABOR, PA-C  pantoprazole  (PROTONIX ) 40 MG tablet Take 1 tablet (40 mg total) by mouth daily. 11/27/23   Raford Lenis, MD  senna-docusate (SENOKOT-S) 8.6-50 MG tablet Take 1 tablet by mouth 2 (two) times daily. Patient not taking: Reported on 01/31/2023 10/03/22   Jillian Buttery, MD  thiamine  (VITAMIN B-1)  100 MG tablet Take 1 tablet (100 mg total) by mouth daily. Patient not taking: Reported on 01/31/2023 10/04/22   Jillian Buttery, MD  valsartan  (DIOVAN ) 40 MG tablet Take 1 tablet (40 mg total) by mouth daily. 12/04/23   Lorren Greig PARAS, NP  VITAMIN D  PO Take 1 capsule by mouth daily.    [provider]    Allergies: Patient has no known allergies.    Review of Systems  Musculoskeletal:  Positive for back pain.    Updated Vital Signs BP 128/89 (BP Location: Right Arm)   Pulse 85   Temp 98.2 F (36.8 C) (Oral)   Resp 17   SpO2 100%   Physical Exam Vitals and nursing note reviewed.  Constitutional:      General: He is not in acute distress.    Appearance:  He is well-developed.  HENT:     Head: Normocephalic and atraumatic.  Eyes:     Conjunctiva/sclera: Conjunctivae normal.  Cardiovascular:     Rate and Rhythm: Normal rate and regular rhythm.     Heart sounds: No murmur heard.    Comments: 2+ radial and pedal pulses bilaterally Pulmonary:     Effort: Pulmonary effort is normal. No respiratory distress.     Breath sounds: Normal breath sounds.  Abdominal:     Palpations: Abdomen is soft.     Tenderness: There is no abdominal tenderness.  Musculoskeletal:        General: No swelling.     Cervical back: Neck supple.     Comments: No tenderness to the cervical, thoracic, or lumbar spine.  No tenderness to the rib cage diffusely.  Moves all extremities without difficulty.  Ambulates without difficulty.  Skin:    General: Skin is warm and dry.     Capillary Refill: Capillary refill takes less than 2 seconds.  Neurological:     Mental Status: He is alert.     Comments: 5/5 strength with resisted hip flexion and extension, knee flexion and extension, ankle plantarflexion and dorsiflexion bilaterally  Intact and symmetric sensation in the bilateral lower extremities  Psychiatric:        Mood and Affect: Mood normal.     (all labs ordered are listed, but only abnormal results are displayed) Labs Reviewed  CBC WITH DIFFERENTIAL/PLATELET - Abnormal; Notable for the following components:      Result Value   RBC 3.42 (*)    HCT 38.9 (*)    MCV 113.7 (*)    MCH 39.2 (*)    All other components within normal limits  COMPREHENSIVE METABOLIC PANEL WITH GFR - Abnormal; Notable for the following components:   Sodium 134 (*)    CO2 18 (*)    AST 42 (*)    Alkaline Phosphatase 228 (*)    All other components within normal limits    EKG: None  Radiology: MR Lumbar Spine W Wo Contrast Result Date: 03/27/2024 EXAM: MR Lumbar Spine with and without intravenous contrast. 03/27/2024 04:42:52 PM TECHNIQUE: Multiplanar multisequence MRI of  the lumbar spine was performed with and without the administration of intravenous contrast. COMPARISON: MRI of the lumbar spine 17/24. CLINICAL HISTORY: Metastatic disease evaluation. Chronic back pain. History of prostate cancer. Recent PET scan shown metastases. Seen at [facility] and given oxy but no relief. FINDINGS: BONES AND ALIGNMENT: Progressive metastases are present throughout the lumbar and visualized lower thoracic spine. Diffuse enhancement is present throughout the L2 vertebral body. A 15 mm lesion is present along the  posterior inferior L2 vertebral body. A lesion anteriorly on the left at L4 measures up to 21 mm. Enhancing lesion extends through the left pedicle of L2 and into the posterior elements. A left lateral lesion at T12 extends into the left pedicle measuring 3.4 cm. No extraosseous extension of metastases is present. No pathologic fractures are present. Normal alignment. SPINAL CORD: The conus terminates normally. SOFT TISSUES: No acute abnormality. T12-L1: A left lateral lesion at T12 extends into the left pedicle measuring 3.4 cm. No disc herniation. No spinal canal stenosis or neural foraminal narrowing. L1-L2: No disc herniation. No spinal canal stenosis or neural foraminal narrowing. L2-L3: Diffuse enhancement is present throughout the L2 vertebral body. A 15 mm lesion is present along the posterior inferior L2 vertebral body. Enhancing lesion extends through the left pedicle of L2 and into the posterior elements. No disc herniation. No spinal canal stenosis or neural foraminal narrowing. L3-L4: A broad-based disc protrusion is present. Moderate foraminal narrowing has progressed bilaterally, right greater than left. L4-L5: A lesion anteriorly on the left at L4 measures up to 21 mm. A broad-based disc protrusion and moderate facet hypertrophy is present. Progressive moderate subarticular and foraminal stenosis is worse right than left. L5-S1: A rightward disc protrusion and moderate facet  hypertrophy results in progressive moderate right and mild left foraminal stenosis similar to the prior study. IMPRESSION: 1. Progressive osseous metastases throughout the lumbar and visualized lower thoracic spine without extraosseous extension or pathologic fracture. 2. Progressive moderate foraminal narrowing at L3-4, right greater than left. 3. Progressive moderate subarticular and foraminal stenosis at L4-5, worse on the right. 4. Progressive moderate right and mild left foraminal stenosis at L5-S1, similar to prior study. Electronically signed by: Lonni Necessary MD 03/27/2024 05:06 PM EDT RP Workstation: [id number]     Procedures   Medications Ordered in the ED  HYDROmorphone  (DILAUDID ) injection 1 mg (1 mg Intravenous Given 03/27/24 1340)  ketorolac (TORADOL) 15 MG/ML injection 15 mg (15 mg Intravenous Given 03/27/24 1536)  morphine  (PF) 4 MG/ML injection 4 mg (4 mg Intravenous Given 03/27/24 1537)  gadobutrol (GADAVIST) 1 MMOL/ML injection 7.5 mL (7.5 mLs Intravenous Contrast Given 03/27/24 1643)  cyclobenzaprine (FLEXERIL) tablet 10 mg (10 mg Oral Given 03/27/24 1939)  morphine  (PF) 4 MG/ML injection 4 mg (4 mg Intravenous Given 03/27/24 1939)                                    Medical Decision Making Amount and/or Complexity of Data Reviewed Labs: ordered. Radiology: ordered.  Risk Prescription drug management.     Differential diagnosis includes but is not limited to cancer pain, radiculopathy, epidural abscess, pneumonia  ED Course:  Upon initial evaluation, patient is well-appearing, no acute distress.  Stable vitals.  Reporting ongoing pain in his back and ribs for several months, but this seems to be gradually worsening.  He reports this feels like the cancer pain he has had, but not controlled with home pain medications anymore.  His last MRI of the thoracic and lumbar spine in 2024 did reveal diffuse osseous metastatic disease throughout the lumbar and thoracic  spine.  This is likely the source of patient's pain.  However, given that it has been worsening, he would like to obtain imaging today to further evaluate his pain.  Will proceed with MRI imaging of the thoracic and lumbar spine.  He is reporting some rib pain, and metastatic disease  was also noted in the ribs on nuclear medicine scan in 2023.  He is denying any fevers, cough, chills, low concern for pneumonia or infectious etiology.  Feel pain is likely secondary to his metastatic disease.  Will obtain basic labs.  Labs Ordered: I Ordered, and personally interpreted labs.  The pertinent results include:   CBC without leukocytosis CMP with elevated alk phos at 228, elevated AST at 42.  Slight hyponatremia at 134.  Otherwise unremarkable  Imaging Studies ordered: I ordered imaging studies including MRI lumbar spine, MRI thoracic spine I independently visualized the imaging with scope of interpretation limited to determining acute life threatening conditions related to emergency care. Imaging showed  MRI lumbar spine: IMPRESSION:  1. Progressive osseous metastases throughout the lumbar and visualized lower  thoracic spine without extraosseous extension or pathologic fracture.  2. Progressive moderate foraminal narrowing at L3-4, right greater than left.  3. Progressive moderate subarticular and foraminal stenosis at L4-5, worse on  the right.  4. Progressive moderate right and mild left foraminal stenosis at L5-S1,  similar to prior study.   Patient unable to hold still for the MRI thoracic spine, unable to obtain this imaging I agree with the radiologist interpretation   Medications Given: 1 mg Dilaudid  15 mg Toradol 4 mg morphine  x2 Lidocaine  patch Flexeril  Upon re-evaluation, patient reports that the Dilaudid  and Toradol did not help much with his pain.  Reports some improvement with pain with morphine .  Unfortunately, he was unable to lie still for the MRI of the thoracic spine due  to discomfort.  We were able to obtain MRI imaging of the lumbar spine which did reveal progressive osseous metastases throughout the lumbar and visualized lower thoracic spine.  No pathologic fracture noted.  Given no neurologic deficits in the lower extremity, doubt any spinal cord impingement or involvement.  Reassuring labs.  Pain seems likely due to the progressive metastatic disease to his spine.  I discussed this case with my attending Dr. Ruthe.  Patient is already on multiple pain medications at home including Tylenol , ibuprofen , and 10 mg oxycodone  every 6 hours as prescribed in recent ER visit.  He already has a referral to pain management which he states he is supposed to get a call in 3 days (Monday) from them to schedule an appointment.  My attending Dr. Ruthe recommends adding Flexeril and lidocaine  patches to pain regimen.  Unfortunately, we feel any additional pain medication will need to be dispensed and managed by pain management or palliative care doctor.  He already is in the process of getting this set up.  Considered admission for pain control, but patient requests discharge home.  He is stable and appropriate for discharge home.  Impression: Prostate cancer metastatic to the lumbar and thoracic spine Cancer pain  Disposition:  The patient was discharged home with instructions to continue taking 10 mg home oxycodone  every 6 hours as prescribed.  May take Tylenol  and ibuprofen  for pain.  May take the prescribed Flexeril for pain.  Understands that Flexeril may make him drowsy and do not drink alcohol or drive after taking this medication.  Lidocaine  patches as needed for pain.  Follow-up with his pain management doctor for further management. Return precautions given and patient verbalized understanding.    Record Review: External records from outside source obtained and reviewed including MRI imaging from 2023 and 2024 showing metastatic disease to the spine of his prostate  cancer     This chart was dictated using  voice recognition software, [facility]. Despite the best efforts of this provider to proofread and correct errors, errors may still occur which can change documentation meaning.       Final diagnoses:  Cancer related pain  Metastasis to spinal column [facility])    ED Discharge Orders          Ordered    cyclobenzaprine (FLEXERIL) 10 MG tablet  Every 8 hours PRN,   Status:  Discontinued        03/27/24 1937    lidocaine  (LIDODERM ) 5 %  Every 24 hours,   Status:  Discontinued        03/27/24 1937    cyclobenzaprine (FLEXERIL) 10 MG tablet  Every 8 hours PRN        03/27/24 2000    lidocaine  (LIDODERM ) 5 %  Every 24 hours        03/27/24 2000               Veta Palma, PA-C 03/28/24 1110    Curatolo, Adam, DO 03/30/24 1452

## 2024-03-27 NOTE — ED Triage Notes (Signed)
 C/o chonic back pain. Hx of prostate cancer. Recent PET scan shown mets. Seen at [facility] and given oxy but no relief,.

## 2024-03-27 NOTE — Discharge Instructions (Addendum)
 Unfortunately, it appears that your pain is secondary to the cancer that has spread into your spine.   Please continue to take your home Oxycodone -this is a narcotic/controlled substance medication that has potential addicting qualities.  You may take 1 tablet every 4-6 hours as needed for severe pain.  Do not drive or operate heavy machinery when taking this medicine as it can be sedating. Do not drink alcohol or take other sedating medications when taking this medicine for safety reasons.  Keep this out of reach of small children.    You may take up to 1000mg  of tylenol  every 6 hours as needed for pain.  Do not take more then 4g per day.  You may use up to 600mg  ibuprofen  every 6 hours as needed for pain.  Do not exceed 2.4g of ibuprofen  per day.  You have been prescribed a muscle relaxer called Flexeril (cyclobenzaprine). You may take 0.5 - 1 tablet (5-10mg ) every 8 hours as needed for muscle pain. This medication can be sedating. Do not drive or operate heavy machinery after taking this medicine. Do not drink alcohol or take other sedating medications when taking this medicine for safety reasons.  Keep this out of reach of small children.  You were given your first dose here today.  You have been prescribed lidocaine  patches to use on your back as needed for pain.  You may apply 1-2 patches to up to 12 hours at a time, then must remove the patch for full 12 hours before reapplying a new patch.  Your first patch was applied today.  Please follow-up with your pain management doctor and cancer doctor for further management of your pain.   Please return to the ER for any numbness in your legs, weakness in your legs, unexplained fever, any other new or concerning symptoms

## 2024-04-01 ENCOUNTER — Other Ambulatory Visit: Payer: Self-pay

## 2024-04-01 ENCOUNTER — Encounter (HOSPITAL_COMMUNITY): Payer: Self-pay

## 2024-04-01 ENCOUNTER — Emergency Department (HOSPITAL_COMMUNITY)
Admission: EM | Admit: 2024-04-01 | Discharge: 2024-04-01 | Disposition: A | Discharge status: Home / Self Care | Attending: Emergency Medicine | Admitting: Emergency Medicine

## 2024-04-01 DIAGNOSIS — F10920 Alcohol use, unspecified with intoxication, uncomplicated: Secondary | ICD-10-CM | POA: Insufficient documentation

## 2024-04-01 DIAGNOSIS — Z79899 Other long term (current) drug therapy: Secondary | ICD-10-CM | POA: Insufficient documentation

## 2024-04-01 DIAGNOSIS — D539 Nutritional anemia, unspecified: Secondary | ICD-10-CM

## 2024-04-01 DIAGNOSIS — R748 Abnormal levels of other serum enzymes: Secondary | ICD-10-CM | POA: Insufficient documentation

## 2024-04-01 DIAGNOSIS — C7951 Secondary malignant neoplasm of bone: Secondary | ICD-10-CM | POA: Diagnosis not present

## 2024-04-01 DIAGNOSIS — C61 Malignant neoplasm of prostate: Secondary | ICD-10-CM | POA: Insufficient documentation

## 2024-04-01 DIAGNOSIS — Y907 Blood alcohol level of 200-239 mg/100 ml: Secondary | ICD-10-CM | POA: Insufficient documentation

## 2024-04-01 DIAGNOSIS — D649 Anemia, unspecified: Secondary | ICD-10-CM | POA: Insufficient documentation

## 2024-04-01 DIAGNOSIS — E871 Hypo-osmolality and hyponatremia: Secondary | ICD-10-CM | POA: Insufficient documentation

## 2024-04-01 DIAGNOSIS — R531 Weakness: Secondary | ICD-10-CM

## 2024-04-01 DIAGNOSIS — I10 Essential (primary) hypertension: Secondary | ICD-10-CM | POA: Insufficient documentation

## 2024-04-01 LAB — CBC WITH DIFFERENTIAL/PLATELET
Abs Immature Granulocytes: 0.04 K/uL (ref 0.00–0.07)
Basophils Absolute: 0.1 K/uL (ref 0.0–0.1)
Basophils Relative: 1 %
Eosinophils Absolute: 0.3 K/uL (ref 0.0–0.5)
Eosinophils Relative: 4 %
HCT: 35.3 % — ABNORMAL LOW (ref 39.0–52.0)
Hemoglobin: 12.4 g/dL — ABNORMAL LOW (ref 13.0–17.0)
Immature Granulocytes: 1 %
Lymphocytes Relative: 41 %
Lymphs Abs: 2.7 K/uL (ref 0.7–4.0)
MCH: 39.9 pg — ABNORMAL HIGH (ref 26.0–34.0)
MCHC: 35.1 g/dL (ref 30.0–36.0)
MCV: 113.5 fL — ABNORMAL HIGH (ref 80.0–100.0)
Monocytes Absolute: 0.6 K/uL (ref 0.1–1.0)
Monocytes Relative: 8 %
Neutro Abs: 3 K/uL (ref 1.7–7.7)
Neutrophils Relative %: 45 %
Platelets: 222 K/uL (ref 150–400)
RBC: 3.11 MIL/uL — ABNORMAL LOW (ref 4.22–5.81)
RDW: 13.5 % (ref 11.5–15.5)
Smear Review: NORMAL
WBC: 6.6 K/uL (ref 4.0–10.5)
nRBC: 0 % (ref 0.0–0.2)

## 2024-04-01 LAB — COMPREHENSIVE METABOLIC PANEL WITH GFR
ALT: 14 U/L (ref 0–44)
AST: 24 U/L (ref 15–41)
Albumin: 4.5 g/dL (ref 3.5–5.0)
Alkaline Phosphatase: 260 U/L — ABNORMAL HIGH (ref 38–126)
Anion gap: 13 (ref 5–15)
BUN: 12 mg/dL (ref 8–23)
CO2: 21 mmol/L — ABNORMAL LOW (ref 22–32)
Calcium: 9.7 mg/dL (ref 8.9–10.3)
Chloride: 99 mmol/L (ref 98–111)
Creatinine, Ser: 0.75 mg/dL (ref 0.61–1.24)
GFR, Estimated: >60 mL/min (ref >60)
Glucose, Bld: 88 mg/dL (ref 70–99)
Potassium: 3.6 mmol/L (ref 3.5–5.1)
Sodium: 133 mmol/L — ABNORMAL LOW (ref 135–145)
Total Bilirubin: <0.2 mg/dL (ref 0.0–1.2)
Total Protein: 7.1 g/dL (ref 6.5–8.1)

## 2024-04-01 LAB — URINE DRUG SCREEN
Amphetamines: NEGATIVE
Barbiturates: NEGATIVE
Benzodiazepines: NEGATIVE
Cocaine: NEGATIVE
Fentanyl: NEGATIVE
Methadone Scn, Ur: NEGATIVE
Opiates: NEGATIVE
Tetrahydrocannabinol: NEGATIVE

## 2024-04-01 LAB — CK: Total CK: 108 U/L (ref 49–397)

## 2024-04-01 LAB — ETHANOL: Alcohol, Ethyl (B): 215 mg/dL — ABNORMAL HIGH (ref <15)

## 2024-04-01 LAB — MAGNESIUM: Magnesium: 1.9 mg/dL (ref 1.7–2.4)

## 2024-04-01 MED ORDER — OXYCODONE-ACETAMINOPHEN 5-325 MG PO TABS
1.0000 | ORAL_TABLET | Freq: Once | ORAL | Status: AC
  Administered 2024-04-01: 1 via ORAL
  Filled 2024-04-01: qty 1

## 2024-04-01 NOTE — ED Provider Notes (Signed)
 [place] EMERGENCY DEPARTMENT AT [facility] Provider Note   CSN: [id number] Arrival date & time: 04/01/24  0119     Patient presents with: Weakness   David Townsend is a 63 y.o. male.   The history is provided by the patient.  Weakness  He has history of hypertension, hyperlipidemia, prostate cancer metastatic to bone and comes in because of inability to walk.  He states he was home and took oxycodone  and had about 6 drinks.  Following that, he walked to the kitchen but his legs gave out and he was unable to walk.  He did not fall.  However, he was only able to crawl on the floor.  He denies any numbness and denies any incontinence.    Prior to Admission medications   Medication Sig Start Date End Date Taking? Authorizing Provider  amLODipine  (NORVASC ) 10 MG tablet Take 1 tablet (10 mg total) by mouth daily. 11/27/23   Lorren Greig PARAS, NP  amLODipine  (NORVASC ) 10 MG tablet Take 1 tablet (10 mg total) by mouth daily. 12/04/23 03/03/24  Lorren Greig PARAS, NP  Ascorbic Acid (VITAMIN C PO) Take 1 tablet by mouth daily.    [provider]  atorvastatin  (LIPITOR) 20 MG tablet Take 1 tablet (20 mg total) by mouth daily. 12/04/23   Lorren Greig PARAS, NP  cyclobenzaprine (FLEXERIL) 10 MG tablet Take 1 tablet (10 mg total) by mouth every 8 (eight) hours as needed for muscle spasms. 03/27/24   Veta Palma, PA-C  ERLEADA  60 MG tablet Take 120 mg by mouth daily. 02/20/22   [provider]  folic acid  (FOLVITE ) 1 MG tablet Take 1 tablet (1 mg total) by mouth daily. 10/04/22   Jillian Buttery, MD  furosemide  (LASIX ) 20 MG tablet Take 1 tablet (20 mg total) by mouth daily. Patient not taking: Reported on 01/31/2023 10/11/22   Lorren Greig PARAS, NP  ibuprofen  (ADVIL ) 800 MG tablet Take 1 tablet (800 mg total) by mouth every 8 (eight) hours as needed for fever, mild pain or headache (alternate with tylenol  for fever). Patient not taking: Reported on 01/31/2023 10/03/22   Jillian Buttery,  MD  Iron , Ferrous Sulfate , 325 (65 Fe) MG TABS Take 325 mg by mouth daily. 11/27/23   Lorren Greig PARAS, NP  lidocaine  (LIDODERM ) 5 % Place 1 patch onto the skin daily. Remove & Discard patch within 12 hours or as directed by MD 03/27/24   Veta Palma, PA-C  Multiple Vitamin (MULTIVITAMIN WITH MINERALS) TABS tablet Take 1 tablet by mouth daily. 10/04/22   Adhikari, Amrit, MD  nicotine  (NICODERM CQ  - DOSED IN MG/24 HOURS) 21 mg/24hr patch Place 1 patch (21 mg total) onto the skin daily. Patient not taking: Reported on 01/31/2023 10/04/22   Jillian Buttery, MD  oxyCODONE  (ROXICODONE ) 5 MG immediate release tablet Take 1 tablet (5 mg total) by mouth every 4 (four) hours as needed for severe pain (pain score 7-10). 03/22/24   Beverley Leita LABOR, PA-C  pantoprazole  (PROTONIX ) 40 MG tablet Take 1 tablet (40 mg total) by mouth daily. 11/27/23   Raford Lenis, MD  senna-docusate (SENOKOT-S) 8.6-50 MG tablet Take 1 tablet by mouth 2 (two) times daily. Patient not taking: Reported on 01/31/2023 10/03/22   Jillian Buttery, MD  thiamine  (VITAMIN B-1) 100 MG tablet Take 1 tablet (100 mg total) by mouth daily. Patient not taking: Reported on 01/31/2023 10/04/22   Jillian Buttery, MD  valsartan  (DIOVAN ) 40 MG tablet Take 1 tablet (40 mg total) by mouth  daily. 12/04/23   Lorren Greig PARAS, NP  VITAMIN D  PO Take 1 capsule by mouth daily.    [provider]    Allergies: Patient has no known allergies.    Review of Systems  Neurological:  Positive for weakness.  All other systems reviewed and are negative.   Updated Vital Signs BP (!) 114/58   Pulse 91   Temp 97.7 F (36.5 C)   Resp 17   Ht 5' 10 (1.778 m)   Wt 70.3 kg   SpO2 97%   BMI 22.24 kg/m   Physical Exam Vitals and nursing note reviewed.   63 year old male, resting comfortably and in no acute distress. Vital signs are normal. Oxygen saturation is 97%, which is normal. Head is normocephalic and atraumatic. PERRLA, EOMI. Oropharynx is  clear. Neck is nontender and supple without adenopathy. Back is nontender. Lungs are clear without rales, wheezes, or rhonchi. Chest is nontender. Heart has regular rate and rhythm without murmur. Abdomen is soft, flat, nontender. Extremities have no cyanosis or edema, full range of motion is present. Skin is warm and dry without rash. Neurologic: Awake and alert and oriented.  Cranial nerves are intact.  Strength is 5/5 in all 4 extremities.  Sensation is intact throughout.  Muscles tested in the legs include hip flexion, knee flexion, knee extension, plantarflexion, dorsiflexion.  (all labs ordered are listed, but only abnormal results are displayed) Labs Reviewed  COMPREHENSIVE METABOLIC PANEL WITH GFR - Abnormal; Notable for the following components:      Result Value   Sodium 133 (*)    CO2 21 (*)    Alkaline Phosphatase 260 (*)    All other components within normal limits  ETHANOL - Abnormal; Notable for the following components:   Alcohol, Ethyl (B) 215 (*)    All other components within normal limits  CBC WITH DIFFERENTIAL/PLATELET - Abnormal; Notable for the following components:   RBC 3.11 (*)    Hemoglobin 12.4 (*)    HCT 35.3 (*)    MCV 113.5 (*)    MCH 39.9 (*)    All other components within normal limits  URINE DRUG SCREEN  CK  MAGNESIUM      Procedures   Medications Ordered in the ED  oxyCODONE -acetaminophen  (PERCOCET/ROXICET) 5-325 MG per tablet 1 tablet (1 tablet Oral Given 04/01/24 0408)    Clinical Course as of 04/01/24 0725  Wed Apr 01, 2024  0658 Metastatic prostate cancer to spine, couldn't walk at home, no neuro deficits, took oxy and alcohol, clinically intoxicated, ambulates here, needs walker then home [LS]    Clinical Course User Index [LS] Rogelia Jerilynn RAMAN, MD                                 Medical Decision Making Amount and/or Complexity of Data Reviewed Labs: ordered.  Risk Prescription drug management.   Weakness with  inability to walk but objectively normal neurologic exam.  No evidence of spinal cord injury.  I suspect that his problem is related to the interaction of ethanol and narcotic use.  I have ordered screening labs and will also attempt to ambulate him in the emergency department.  I have reviewed his laboratory tests, my interpretation is stable macrocytic anemia, mild hyponatremia which is not felt to be clinically significant, stable elevation of alkaline phosphatase likely secondary to bone metastases, markedly elevated ethanol level consistent with ethanol intoxication.  Drug  screen is negative.  Patient was able to ambulate in the emergency department, but was slightly unsteady and he likely will need a walker.  I am discharging him with a prescription for a walker and advised him to severely limit his ethanol consumption since he is likely to need narcotic pain medication on an ongoing basis.     Final diagnoses:  Weakness  Alcohol intoxication, uncomplicated  Prostate cancer metastatic to bone (HCC)  Macrocytic anemia  Hyponatremia  Elevated alkaline phosphatase level    ED Discharge Orders          Ordered    Walker standard        04/01/24 9276               Raford Lenis, MD 04/01/24 [id number]

## 2024-04-01 NOTE — Discharge Instructions (Addendum)
 Your inability to walk at home was because you had too much to drink combined with taking a narcotic pain medication.  If you are going to take narcotic pain medication, you should not drink more than 12 ounces of beer or 5 ounces of wine.  Use a walker as needed.

## 2024-04-01 NOTE — ED Triage Notes (Addendum)
 BIB EMS from home for chronic back pain. Pt states he drank vodka and mixed it with oxycodone  today and then began feeling weak and his legs gave out. Pt states his legs were not responding at all and he could not move them. Pt now has complete feeling in legs, no numbness or tingling. Hx of metastatic prostate cancer.

## 2024-04-07 ENCOUNTER — Inpatient Hospital Stay (HOSPITAL_COMMUNITY)
Admission: EM | Admit: 2024-04-07 | Discharge: 2024-04-10 | DRG: 948 | Disposition: A | Discharge status: Home / Self Care | Attending: Internal Medicine | Admitting: Internal Medicine

## 2024-04-07 DIAGNOSIS — K219 Gastro-esophageal reflux disease without esophagitis: Secondary | ICD-10-CM | POA: Diagnosis present

## 2024-04-07 DIAGNOSIS — G893 Neoplasm related pain (acute) (chronic): Secondary | ICD-10-CM | POA: Diagnosis not present

## 2024-04-07 DIAGNOSIS — D630 Anemia in neoplastic disease: Secondary | ICD-10-CM | POA: Diagnosis present

## 2024-04-07 DIAGNOSIS — C61 Malignant neoplasm of prostate: Secondary | ICD-10-CM | POA: Diagnosis present

## 2024-04-07 DIAGNOSIS — M545 Low back pain, unspecified: Secondary | ICD-10-CM | POA: Diagnosis not present

## 2024-04-07 DIAGNOSIS — Z515 Encounter for palliative care: Principal | ICD-10-CM

## 2024-04-07 DIAGNOSIS — E785 Hyperlipidemia, unspecified: Secondary | ICD-10-CM | POA: Diagnosis present

## 2024-04-07 DIAGNOSIS — R531 Weakness: Secondary | ICD-10-CM

## 2024-04-07 DIAGNOSIS — F1010 Alcohol abuse, uncomplicated: Secondary | ICD-10-CM | POA: Diagnosis present

## 2024-04-07 DIAGNOSIS — Z79899 Other long term (current) drug therapy: Secondary | ICD-10-CM

## 2024-04-07 DIAGNOSIS — F17200 Nicotine dependence, unspecified, uncomplicated: Secondary | ICD-10-CM

## 2024-04-07 DIAGNOSIS — K5900 Constipation, unspecified: Secondary | ICD-10-CM | POA: Diagnosis present

## 2024-04-07 DIAGNOSIS — D638 Anemia in other chronic diseases classified elsewhere: Secondary | ICD-10-CM | POA: Diagnosis present

## 2024-04-07 DIAGNOSIS — I2510 Atherosclerotic heart disease of native coronary artery without angina pectoris: Secondary | ICD-10-CM | POA: Diagnosis present

## 2024-04-07 DIAGNOSIS — Z8549 Personal history of malignant neoplasm of other male genital organs: Secondary | ICD-10-CM

## 2024-04-07 DIAGNOSIS — E860 Dehydration: Secondary | ICD-10-CM | POA: Diagnosis present

## 2024-04-07 DIAGNOSIS — I10 Essential (primary) hypertension: Secondary | ICD-10-CM | POA: Diagnosis present

## 2024-04-07 DIAGNOSIS — Z87898 Personal history of other specified conditions: Secondary | ICD-10-CM

## 2024-04-07 DIAGNOSIS — Z8546 Personal history of malignant neoplasm of prostate: Secondary | ICD-10-CM

## 2024-04-07 DIAGNOSIS — E782 Mixed hyperlipidemia: Secondary | ICD-10-CM

## 2024-04-07 DIAGNOSIS — F1090 Alcohol use, unspecified, uncomplicated: Secondary | ICD-10-CM

## 2024-04-07 DIAGNOSIS — G8929 Other chronic pain: Secondary | ICD-10-CM

## 2024-04-07 DIAGNOSIS — I700 Atherosclerosis of aorta: Secondary | ICD-10-CM | POA: Diagnosis present

## 2024-04-07 DIAGNOSIS — C7951 Secondary malignant neoplasm of bone: Principal | ICD-10-CM | POA: Diagnosis present

## 2024-04-07 DIAGNOSIS — R609 Edema, unspecified: Secondary | ICD-10-CM

## 2024-04-07 DIAGNOSIS — Z7189 Other specified counseling: Secondary | ICD-10-CM

## 2024-04-07 DIAGNOSIS — E871 Hypo-osmolality and hyponatremia: Secondary | ICD-10-CM | POA: Diagnosis present

## 2024-04-07 DIAGNOSIS — F17210 Nicotine dependence, cigarettes, uncomplicated: Secondary | ICD-10-CM | POA: Diagnosis present

## 2024-04-07 LAB — CBC WITH DIFFERENTIAL/PLATELET
Abs Immature Granulocytes: 0.06 K/uL (ref 0.00–0.07)
Basophils Absolute: 0.1 K/uL (ref 0.0–0.1)
Basophils Relative: 1 %
Eosinophils Absolute: 0.2 K/uL (ref 0.0–0.5)
Eosinophils Relative: 3 %
HCT: 35.2 % — ABNORMAL LOW (ref 39.0–52.0)
Hemoglobin: 11.5 g/dL — ABNORMAL LOW (ref 13.0–17.0)
Immature Granulocytes: 1 %
Lymphocytes Relative: 19 %
Lymphs Abs: 1.8 K/uL (ref 0.7–4.0)
MCH: 37.3 pg — ABNORMAL HIGH (ref 26.0–34.0)
MCHC: 32.7 g/dL (ref 30.0–36.0)
MCV: 114.3 fL — ABNORMAL HIGH (ref 80.0–100.0)
Monocytes Absolute: 0.7 K/uL (ref 0.1–1.0)
Monocytes Relative: 8 %
Neutro Abs: 6.6 K/uL (ref 1.7–7.7)
Neutrophils Relative %: 68 %
Platelets: 185 K/uL (ref 150–400)
RBC: 3.08 MIL/uL — ABNORMAL LOW (ref 4.22–5.81)
RDW: 13.7 % (ref 11.5–15.5)
WBC: 9.5 K/uL (ref 4.0–10.5)
nRBC: 0.2 % (ref 0.0–0.2)

## 2024-04-07 LAB — COMPREHENSIVE METABOLIC PANEL WITH GFR
ALT: 18 U/L (ref 0–44)
AST: 41 U/L (ref 15–41)
Albumin: 4.2 g/dL (ref 3.5–5.0)
Alkaline Phosphatase: 257 U/L — ABNORMAL HIGH (ref 38–126)
Anion gap: 13 (ref 5–15)
BUN: 7 mg/dL — ABNORMAL LOW (ref 8–23)
CO2: 21 mmol/L — ABNORMAL LOW (ref 22–32)
Calcium: 10 mg/dL (ref 8.9–10.3)
Chloride: 96 mmol/L — ABNORMAL LOW (ref 98–111)
Creatinine, Ser: 0.8 mg/dL (ref 0.61–1.24)
GFR, Estimated: >60 mL/min (ref >60)
Glucose, Bld: 92 mg/dL (ref 70–99)
Potassium: 3.9 mmol/L (ref 3.5–5.1)
Sodium: 131 mmol/L — ABNORMAL LOW (ref 135–145)
Total Bilirubin: 0.5 mg/dL (ref 0.0–1.2)
Total Protein: 6.7 g/dL (ref 6.5–8.1)

## 2024-04-07 MED ORDER — OXYCODONE HCL 5 MG PO TABS: 10.0000 mg | ORAL_TABLET | Freq: Four times a day (QID) | ORAL | Status: DC | PRN

## 2024-04-07 MED ORDER — HYDROMORPHONE HCL 1 MG/ML IJ SOLN
1.0000 mg | Freq: Once | INTRAMUSCULAR | Status: AC
  Administered 2024-04-07: 1 mg via INTRAVENOUS
  Filled 2024-04-07: qty 1

## 2024-04-07 MED ORDER — ACETAMINOPHEN 500 MG PO TABS
1000.0000 mg | ORAL_TABLET | Freq: Three times a day (TID) | ORAL | Status: DC
  Administered 2024-04-07 – 2024-04-08 (×2): 1000 mg via ORAL
  Filled 2024-04-07 (×2): qty 2

## 2024-04-07 MED ORDER — ACETAMINOPHEN 650 MG RE SUPP: 650.0000 mg | Freq: Four times a day (QID) | RECTAL | Status: DC | PRN

## 2024-04-07 MED ORDER — HYDROMORPHONE HCL 1 MG/ML IJ SOLN
1.0000 mg | INTRAMUSCULAR | Status: DC | PRN
  Administered 2024-04-08 (×3): 1 mg via INTRAVENOUS
  Filled 2024-04-07 (×3): qty 1

## 2024-04-07 MED ORDER — ENOXAPARIN SODIUM 40 MG/0.4ML IJ SOSY
40.0000 mg | PREFILLED_SYRINGE | INTRAMUSCULAR | Status: DC
  Administered 2024-04-08 – 2024-04-10 (×3): 40 mg via SUBCUTANEOUS
  Filled 2024-04-07 (×3): qty 0.4

## 2024-04-07 MED ORDER — THIAMINE MONONITRATE 100 MG PO TABS
100.0000 mg | ORAL_TABLET | Freq: Every day | ORAL | Status: DC
  Administered 2024-04-08 – 2024-04-10 (×3): 100 mg via ORAL
  Filled 2024-04-07 (×3): qty 1

## 2024-04-07 MED ORDER — CYCLOBENZAPRINE HCL 10 MG PO TABS
10.0000 mg | ORAL_TABLET | Freq: Once | ORAL | Status: AC
  Administered 2024-04-07: 10 mg via ORAL
  Filled 2024-04-07: qty 1

## 2024-04-07 MED ORDER — BISACODYL 5 MG PO TBEC
5.0000 mg | DELAYED_RELEASE_TABLET | Freq: Every day | ORAL | Status: DC | PRN
Start: 2024-04-07 — End: 2024-04-09

## 2024-04-07 MED ORDER — ACETAMINOPHEN 325 MG PO TABS: 650.0000 mg | ORAL_TABLET | Freq: Four times a day (QID) | ORAL | Status: DC | PRN

## 2024-04-07 MED ORDER — SODIUM CHLORIDE 0.9 % IV BOLUS
1000.0000 mL | Freq: Once | INTRAVENOUS | Status: AC
Start: 2024-04-07 — End: 2024-04-07
  Administered 2024-04-07: 1000 mL via INTRAVENOUS

## 2024-04-07 MED ORDER — ONDANSETRON HCL 4 MG/2ML IJ SOLN: 4.0000 mg | Freq: Four times a day (QID) | INTRAMUSCULAR | Status: DC | PRN

## 2024-04-07 MED ORDER — ADULT MULTIVITAMIN W/MINERALS CH
1.0000 | ORAL_TABLET | Freq: Every day | ORAL | Status: DC
  Administered 2024-04-08 – 2024-04-10 (×3): 1 via ORAL
  Filled 2024-04-07 (×3): qty 1

## 2024-04-07 MED ORDER — LORAZEPAM 2 MG/ML IJ SOLN: 1.0000 mg | INTRAMUSCULAR | Status: DC | PRN

## 2024-04-07 MED ORDER — LIDOCAINE 5 % EX PTCH
1.0000 | MEDICATED_PATCH | CUTANEOUS | Status: DC
  Administered 2024-04-07: 1 via TRANSDERMAL
  Filled 2024-04-07: qty 1

## 2024-04-07 MED ORDER — SENNOSIDES-DOCUSATE SODIUM 8.6-50 MG PO TABS: 1.0000 | ORAL_TABLET | Freq: Every evening | ORAL | Status: DC | PRN

## 2024-04-07 MED ORDER — SODIUM CHLORIDE 0.9 % IV SOLN: INTRAVENOUS | Status: DC

## 2024-04-07 MED ORDER — LORAZEPAM 1 MG PO TABS: 1.0000 mg | ORAL_TABLET | ORAL | Status: DC | PRN

## 2024-04-07 MED ORDER — METHOCARBAMOL 500 MG PO TABS
500.0000 mg | ORAL_TABLET | Freq: Four times a day (QID) | ORAL | Status: DC | PRN
  Administered 2024-04-08 – 2024-04-09 (×4): 500 mg via ORAL
  Filled 2024-04-07 (×4): qty 1

## 2024-04-07 MED ORDER — ONDANSETRON HCL 4 MG PO TABS: 4.0000 mg | ORAL_TABLET | Freq: Four times a day (QID) | ORAL | Status: DC | PRN

## 2024-04-07 MED ORDER — THIAMINE HCL 100 MG/ML IJ SOLN
100.0000 mg | Freq: Every day | INTRAMUSCULAR | Status: DC
  Filled 2024-04-07: qty 2

## 2024-04-07 MED ORDER — FOLIC ACID 1 MG PO TABS
1.0000 mg | ORAL_TABLET | Freq: Every day | ORAL | Status: DC
  Administered 2024-04-08 – 2024-04-10 (×3): 1 mg via ORAL
  Filled 2024-04-07 (×3): qty 1

## 2024-04-07 NOTE — ED Provider Notes (Signed)
 [facility] Provider Note   CSN: [id number] Arrival date & time: 04/07/24  1825     Patient presents with: prostate pain   David [facility] is a 63 y.o. male.  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  Recently increased dose of oxycodone  from 5 mg every 4 hours to 10 mg every 4 hours but still no pain relief.  Has also tried medical marijuana without much relief.  Drank 1 beer today.  No fevers chills or deficits in lower extremities    HPI     Prior to Admission medications   Medication Sig Start Date End Date Taking? Authorizing Provider  Oxycodone  HCl 10 MG TABS Take 10 mg by mouth every 6 (six) hours as needed. 03/25/24  Yes [provider]  amLODipine  (NORVASC ) 10 MG tablet Take 1 tablet (10 mg total) by mouth daily. 11/27/23   Lorren Greig PARAS, NP  amLODipine  (NORVASC ) 10 MG tablet Take 1 tablet (10 mg total) by mouth daily. 12/04/23 03/03/24  Lorren Greig PARAS, NP  Ascorbic Acid (VITAMIN C PO) Take 1 tablet by mouth daily.    [provider]  atorvastatin  (LIPITOR) 20 MG tablet Take 1 tablet (20 mg total) by mouth daily. 12/04/23   Lorren Greig PARAS, NP  cyclobenzaprine (FLEXERIL) 10 MG tablet Take 1 tablet (10 mg total) by mouth every 8 (eight) hours as needed for muscle spasms. 03/27/24   Veta Palma, PA-C  ERLEADA  60 MG tablet Take 120 mg by mouth daily. 02/20/22   [provider]  folic acid  (FOLVITE ) 1 MG tablet Take 1 tablet (1 mg total) by mouth daily. 10/04/22   Jillian Buttery, MD  furosemide  (LASIX ) 20 MG tablet Take 1 tablet (20 mg total) by mouth daily. Patient not taking: Reported on 01/31/2023 10/11/22   Lorren Greig PARAS, NP  ibuprofen  (ADVIL ) 800 MG tablet Take 1 tablet (800 mg total) by mouth every 8 (eight) hours as needed for fever, mild pain or headache (alternate with tylenol  for fever). Patient not taking: Reported on  01/31/2023 10/03/22   Jillian Buttery, MD  Iron , Ferrous Sulfate , 325 (65 Fe) MG TABS Take 325 mg by mouth daily. 11/27/23   Lorren Greig PARAS, NP  lidocaine  (LIDODERM ) 5 % Place 1 patch onto the skin daily. Remove & Discard patch within 12 hours or as directed by MD 03/27/24   Veta Palma, PA-C  Multiple Vitamin (MULTIVITAMIN WITH MINERALS) TABS tablet Take 1 tablet by mouth daily. 10/04/22   Adhikari, Amrit, MD  nicotine  (NICODERM CQ  - DOSED IN MG/24 HOURS) 21 mg/24hr patch Place 1 patch (21 mg total) onto the skin daily. Patient not taking: Reported on 01/31/2023 10/04/22   Jillian Buttery, MD  oxyCODONE  (ROXICODONE ) 5 MG immediate release tablet Take 1 tablet (5 mg total) by mouth every 4 (four) hours as needed for severe pain (pain score 7-10). 03/22/24   Beverley Leita LABOR, PA-C  pantoprazole  (PROTONIX ) 40 MG tablet Take 1 tablet (40 mg total) by mouth daily. 11/27/23   Raford Lenis, MD  senna-docusate (SENOKOT-S) 8.6-50 MG tablet Take 1 tablet by mouth 2 (two) times daily. Patient not taking: Reported on 01/31/2023 10/03/22   Jillian Buttery, MD  thiamine  (VITAMIN B-1) 100 MG tablet Take 1 tablet (100 mg total) by mouth daily. Patient not taking: Reported on 01/31/2023 10/04/22   Jillian Buttery, MD  valsartan  (DIOVAN ) 40 MG  tablet Take 1 tablet (40 mg total) by mouth daily. 12/04/23   Lorren Greig PARAS, NP  VITAMIN D  PO Take 1 capsule by mouth daily.    [provider]    Allergies: Patient has no known allergies.    Review of Systems  Updated Vital Signs BP (!) 169/84   Pulse 98   Temp 98 F (36.7 C)   Resp (!) 21   Ht 5' 10 (1.778 m)   Wt 70.3 kg   SpO2 97%   BMI 22.24 kg/m   Physical Exam Vitals and nursing note reviewed.  HENT:     Head: Normocephalic and atraumatic.  Eyes:     Pupils: Pupils are equal, round, and reactive to light.  Cardiovascular:     Rate and Rhythm: Normal rate and regular rhythm.  Pulmonary:     Effort: Pulmonary effort is normal.     Breath sounds:  Normal breath sounds.  Abdominal:     Palpations: Abdomen is soft.     Tenderness: There is no abdominal tenderness.  Skin:    General: Skin is warm and dry.  Neurological:     Mental Status: He is alert.  Psychiatric:        Mood and Affect: Mood normal.     (all labs ordered are listed, but only abnormal results are displayed) Labs Reviewed  COMPREHENSIVE METABOLIC PANEL WITH GFR - Abnormal; Notable for the following components:      Result Value   Sodium 131 (*)    Chloride 96 (*)    CO2 21 (*)    BUN 7 (*)    Alkaline Phosphatase 257 (*)    All other components within normal limits  CBC WITH DIFFERENTIAL/PLATELET - Abnormal; Notable for the following components:   RBC 3.08 (*)    Hemoglobin 11.5 (*)    HCT 35.2 (*)    MCV 114.3 (*)    MCH 37.3 (*)    All other components within normal limits  HIV ANTIBODY (ROUTINE TESTING W REFLEX)  BASIC METABOLIC PANEL WITH GFR  CBC    EKG: None  Radiology: No results found.   Procedures   Medications Ordered in the ED  senna-docusate (Senokot-S) tablet 1 tablet (has no administration in time range)  bisacodyl (DULCOLAX) EC tablet 5 mg (has no administration in time range)  ondansetron  (ZOFRAN ) tablet 4 mg (has no administration in time range)    Or  ondansetron  (ZOFRAN ) injection 4 mg (has no administration in time range)  enoxaparin  (LOVENOX ) injection 40 mg (has no administration in time range)  oxyCODONE  (Oxy IR/ROXICODONE ) immediate release tablet 10 mg (has no administration in time range)  HYDROmorphone  (DILAUDID ) injection 1 mg (has no administration in time range)  acetaminophen  (TYLENOL ) tablet 1,000 mg (has no administration in time range)  methocarbamol (ROBAXIN) tablet 500 mg (has no administration in time range)  lidocaine  (LIDODERM ) 5 % 1 patch (has no administration in time range)  sodium chloride  0.9 % bolus 1,000 mL (0 mLs Intravenous Stopped 04/07/24 2010)  HYDROmorphone  (DILAUDID ) injection 1 mg (1 mg  Intravenous Given 04/07/24 1904)  HYDROmorphone  (DILAUDID ) injection 1 mg (1 mg Intravenous Given 04/07/24 2008)  cyclobenzaprine (FLEXERIL) tablet 10 mg (10 mg Oral Given 04/07/24 2008)  HYDROmorphone  (DILAUDID ) injection 1 mg (1 mg Intravenous Given 04/07/24 2213)    Clinical Course as of 04/07/24 2314  Tue Apr 07, 2024  2218 Discussed with admitting hospitalist accepts patient for admission [MP]    Clinical Course User  Index [MP] Pamella Ozell LABOR, DO                                 Medical Decision Making 63 year old male with history as above presenting for uncontrolled pain due to metastatic prostate cancer with known mets to thoracic and lumbar spine.  Severe lower back pain made worse with coughing and any movement.  Uncontrolled pain despite oxycodone  10 mg every 4 hours at home.  Will obtain laboratory workup, Dilaudid  for pain control and plan for admission to medicine here for pain control  Amount and/or Complexity of Data Reviewed Labs: ordered.  Risk Prescription drug management. Decision regarding hospitalization.        Final diagnoses:  Prostate cancer metastatic to bone [facility])  Chronic low back pain without sciatica, unspecified back pain laterality    ED Discharge Orders     None          Pamella Ozell LABOR, DO 04/07/24 2314

## 2024-04-07 NOTE — ED Triage Notes (Addendum)
 Pt BIB EMS from home. Previously dx w prostate cancer that spread to bones. Generalized body pains and spasms for a few weeks now and is getting worse. Pt attempted medical mariajuana and prescribed oxycodone . Took some oxycodone  at 2pm today. Pt states a loss of appetite and not eating for 2 days. Has an urethral catheter (chronic, pt states he has had it for years now).   EMS vitals  BP 162/98 HR 99 SPO2 99RA

## 2024-04-08 ENCOUNTER — Encounter (HOSPITAL_COMMUNITY): Payer: Self-pay | Admitting: Student

## 2024-04-08 ENCOUNTER — Inpatient Hospital Stay (HOSPITAL_COMMUNITY)

## 2024-04-08 ENCOUNTER — Other Ambulatory Visit: Payer: Self-pay

## 2024-04-08 ENCOUNTER — Ambulatory Visit: Admitting: Radiation Oncology

## 2024-04-08 DIAGNOSIS — K5900 Constipation, unspecified: Secondary | ICD-10-CM

## 2024-04-08 DIAGNOSIS — Z7189 Other specified counseling: Secondary | ICD-10-CM

## 2024-04-08 DIAGNOSIS — D638 Anemia in other chronic diseases classified elsewhere: Secondary | ICD-10-CM | POA: Diagnosis present

## 2024-04-08 DIAGNOSIS — C7951 Secondary malignant neoplasm of bone: Secondary | ICD-10-CM | POA: Diagnosis present

## 2024-04-08 DIAGNOSIS — Z515 Encounter for palliative care: Secondary | ICD-10-CM

## 2024-04-08 DIAGNOSIS — R531 Weakness: Secondary | ICD-10-CM

## 2024-04-08 DIAGNOSIS — I2510 Atherosclerotic heart disease of native coronary artery without angina pectoris: Secondary | ICD-10-CM | POA: Diagnosis present

## 2024-04-08 DIAGNOSIS — E860 Dehydration: Secondary | ICD-10-CM | POA: Diagnosis present

## 2024-04-08 DIAGNOSIS — I700 Atherosclerosis of aorta: Secondary | ICD-10-CM | POA: Diagnosis present

## 2024-04-08 DIAGNOSIS — D630 Anemia in neoplastic disease: Secondary | ICD-10-CM | POA: Diagnosis present

## 2024-04-08 DIAGNOSIS — K219 Gastro-esophageal reflux disease without esophagitis: Secondary | ICD-10-CM | POA: Diagnosis present

## 2024-04-08 DIAGNOSIS — F17210 Nicotine dependence, cigarettes, uncomplicated: Secondary | ICD-10-CM | POA: Diagnosis present

## 2024-04-08 DIAGNOSIS — G893 Neoplasm related pain (acute) (chronic): Secondary | ICD-10-CM | POA: Diagnosis present

## 2024-04-08 DIAGNOSIS — Z79899 Other long term (current) drug therapy: Secondary | ICD-10-CM | POA: Diagnosis not present

## 2024-04-08 DIAGNOSIS — Z8549 Personal history of malignant neoplasm of other male genital organs: Secondary | ICD-10-CM | POA: Diagnosis not present

## 2024-04-08 DIAGNOSIS — M545 Low back pain, unspecified: Secondary | ICD-10-CM | POA: Diagnosis present

## 2024-04-08 DIAGNOSIS — E785 Hyperlipidemia, unspecified: Secondary | ICD-10-CM | POA: Diagnosis present

## 2024-04-08 DIAGNOSIS — C61 Malignant neoplasm of prostate: Secondary | ICD-10-CM | POA: Diagnosis present

## 2024-04-08 DIAGNOSIS — E871 Hypo-osmolality and hyponatremia: Secondary | ICD-10-CM | POA: Diagnosis present

## 2024-04-08 DIAGNOSIS — F1010 Alcohol abuse, uncomplicated: Secondary | ICD-10-CM | POA: Diagnosis present

## 2024-04-08 DIAGNOSIS — I10 Essential (primary) hypertension: Secondary | ICD-10-CM | POA: Diagnosis present

## 2024-04-08 DIAGNOSIS — Z8546 Personal history of malignant neoplasm of prostate: Secondary | ICD-10-CM | POA: Diagnosis not present

## 2024-04-08 DIAGNOSIS — G8929 Other chronic pain: Secondary | ICD-10-CM

## 2024-04-08 LAB — BASIC METABOLIC PANEL WITH GFR
Anion gap: 10 (ref 5–15)
BUN: 7 mg/dL — ABNORMAL LOW (ref 8–23)
CO2: 23 mmol/L (ref 22–32)
Calcium: 9.4 mg/dL (ref 8.9–10.3)
Chloride: 101 mmol/L (ref 98–111)
Creatinine, Ser: 0.71 mg/dL (ref 0.61–1.24)
GFR, Estimated: >60 mL/min (ref >60)
Glucose, Bld: 98 mg/dL (ref 70–99)
Potassium: 4 mmol/L (ref 3.5–5.1)
Sodium: 134 mmol/L — ABNORMAL LOW (ref 135–145)

## 2024-04-08 LAB — CBC
HCT: 33.2 % — ABNORMAL LOW (ref 39.0–52.0)
Hemoglobin: 11.3 g/dL — ABNORMAL LOW (ref 13.0–17.0)
MCH: 39.1 pg — ABNORMAL HIGH (ref 26.0–34.0)
MCHC: 34 g/dL (ref 30.0–36.0)
MCV: 114.9 fL — ABNORMAL HIGH (ref 80.0–100.0)
Platelets: 171 K/uL (ref 150–400)
RBC: 2.89 MIL/uL — ABNORMAL LOW (ref 4.22–5.81)
RDW: 13.6 % (ref 11.5–15.5)
WBC: 8.6 K/uL (ref 4.0–10.5)
nRBC: 0 % (ref 0.0–0.2)

## 2024-04-08 LAB — PHOSPHORUS: Phosphorus: 3.2 mg/dL (ref 2.5–4.6)

## 2024-04-08 LAB — PSA: Prostatic Specific Antigen: 244.93 ng/mL — ABNORMAL HIGH (ref 0.00–4.00)

## 2024-04-08 LAB — ETHANOL: Alcohol, Ethyl (B): <15 mg/dL (ref <15)

## 2024-04-08 LAB — MAGNESIUM: Magnesium: 1.6 mg/dL — ABNORMAL LOW (ref 1.7–2.4)

## 2024-04-08 LAB — HIV ANTIBODY (ROUTINE TESTING W REFLEX): HIV Screen 4th Generation wRfx: NONREACTIVE

## 2024-04-08 MED ORDER — SENNOSIDES-DOCUSATE SODIUM 8.6-50 MG PO TABS: 1.0000 | ORAL_TABLET | Freq: Every day | ORAL | Status: DC

## 2024-04-08 MED ORDER — ATORVASTATIN CALCIUM 20 MG PO TABS
20.0000 mg | ORAL_TABLET | Freq: Every day | ORAL | Status: DC
  Administered 2024-04-08 – 2024-04-10 (×3): 20 mg via ORAL
  Filled 2024-04-08: qty 1
  Filled 2024-04-08: qty 2
  Filled 2024-04-08: qty 1

## 2024-04-08 MED ORDER — APALUTAMIDE 60 MG PO TABS: 120.0000 mg | ORAL_TABLET | Freq: Every day | ORAL | Status: DC

## 2024-04-08 MED ORDER — ACETAMINOPHEN 500 MG PO TABS
1000.0000 mg | ORAL_TABLET | Freq: Three times a day (TID) | ORAL | Status: DC
  Administered 2024-04-08 – 2024-04-10 (×6): 1000 mg via ORAL
  Filled 2024-04-08 (×6): qty 2

## 2024-04-08 MED ORDER — IRBESARTAN 75 MG PO TABS
75.0000 mg | ORAL_TABLET | Freq: Every day | ORAL | Status: DC
  Administered 2024-04-08 – 2024-04-10 (×3): 75 mg via ORAL
  Filled 2024-04-08 (×3): qty 1

## 2024-04-08 MED ORDER — GADOBUTROL 1 MMOL/ML IV SOLN
7.0000 mL | Freq: Once | INTRAVENOUS | Status: AC | PRN
  Administered 2024-04-08: 7 mL via INTRAVENOUS

## 2024-04-08 MED ORDER — MORPHINE SULFATE 15 MG PO TABS
15.0000 mg | ORAL_TABLET | ORAL | Status: DC | PRN
  Administered 2024-04-08 – 2024-04-09 (×5): 15 mg via ORAL
  Filled 2024-04-08 (×5): qty 1

## 2024-04-08 MED ORDER — SENNOSIDES-DOCUSATE SODIUM 8.6-50 MG PO TABS
1.0000 | ORAL_TABLET | Freq: Two times a day (BID) | ORAL | Status: DC
  Administered 2024-04-08 – 2024-04-09 (×2): 1 via ORAL
  Filled 2024-04-08 (×2): qty 1

## 2024-04-08 MED ORDER — NICOTINE 21 MG/24HR TD PT24
21.0000 mg | MEDICATED_PATCH | Freq: Every day | TRANSDERMAL | Status: DC
  Administered 2024-04-08 – 2024-04-10 (×3): 21 mg via TRANSDERMAL
  Filled 2024-04-08 (×3): qty 1

## 2024-04-08 MED ORDER — MAGNESIUM SULFATE 2 GM/50ML IV SOLN
2.0000 g | Freq: Once | INTRAVENOUS | Status: AC
  Administered 2024-04-08: 2 g via INTRAVENOUS
  Filled 2024-04-08: qty 50

## 2024-04-08 MED ORDER — AMLODIPINE BESYLATE 10 MG PO TABS
10.0000 mg | ORAL_TABLET | Freq: Every day | ORAL | Status: DC
  Administered 2024-04-08 – 2024-04-10 (×3): 10 mg via ORAL
  Filled 2024-04-08: qty 2
  Filled 2024-04-08 (×2): qty 1

## 2024-04-08 MED ORDER — HYDROMORPHONE HCL 1 MG/ML IJ SOLN
1.0000 mg | INTRAMUSCULAR | Status: DC | PRN
  Administered 2024-04-08 – 2024-04-09 (×7): 1 mg via INTRAVENOUS
  Filled 2024-04-08 (×7): qty 1

## 2024-04-08 MED ORDER — HYDRALAZINE HCL 20 MG/ML IJ SOLN: 10.0000 mg | Freq: Three times a day (TID) | INTRAMUSCULAR | Status: DC | PRN

## 2024-04-08 NOTE — Evaluation (Signed)
 Occupational Therapy Evaluation Patient Details Name: David Townsend MRN: [id number] DOB: 05-Aug-1960 Today's Date: 04/08/2024   History of Present Illness   Pt is a 63 y.o. male presenting to [facility] ED for cancer-related pain. PMH significant for prostate cancer with spinal metastases, rib fx, anemia of chronic disease, GERD, HTN, HLD, cancer-related pain, tobacco use disorder and alcohol use disorder. Imaging shows progression of diffuse bone metastasis to skull, ribs, spine, pelvis, humerus, and femurs.     Clinical Impressions Pt admitted with above. Prior to admission, pt lives alone and was independent with ADLs/mobility. Pt greatly limited by pain during OT eval. Pt reports increased pain with minimal movement, pt grimacing and crying out when pulling self up to long sitting position in ED gurney. Anticipate pt will require +2 assist for bed level ADLs at this time, will continue to further assess mobility when pain is better controlled.   Pt would benefit from skilled OT services to address noted impairments and functional limitations (see below for any additional details) in order to maximize safety and independence while minimizing falls risk and caregiver burden. Anticipate the need for follow up OT services upon acute hospital DC. Patient will benefit from continued inpatient follow up therapy, <3 hours/day      If plan is discharge home, recommend the following:   Two people to help with walking and/or transfers;Two people to help with bathing/dressing/bathroom;Assist for transportation;Help with stairs or ramp for entrance;Direct supervision/assist for medications management;Direct supervision/assist for financial management     Functional Status Assessment   Patient has had a recent decline in their functional status and demonstrates the ability to make significant improvements in function in a reasonable and predictable amount of time.     Equipment Recommendations   Other  (comment) (defer to next LOC)      Precautions/Restrictions   Precautions Precautions: Fall Restrictions Weight Bearing Restrictions Per Provider Order: No     Mobility Bed Mobility Overal bed mobility: Needs Assistance             General bed mobility comments: bed mobility not fully assessed 2/2 pain. pt pulls self forward into semi-long sitting but immediately returns back to supine in gurney due to pain. cannot tolerate rolling or further mobilty. DO entering room.    Transfers                   General transfer comment: NT      Balance       Sitting balance - Comments: NT due to pain                                   ADL either performed or assessed with clinical judgement   ADL Overall ADL's : Needs assistance/impaired Eating/Feeding: Minimal assistance;Bed level   Grooming: Bed level;Minimal assistance   Upper Body Bathing: Total assistance;Bed level   Lower Body Bathing: Bed level;Total assistance;+2 for physical assistance;Cueing for back precautions   Upper Body Dressing : Bed level;Maximal assistance   Lower Body Dressing: Bed level;Total assistance;+2 for physical assistance;Cueing for back precautions     Toilet Transfer Details (indicate cue type and reason): NT. chronic foley Toileting- Clothing Manipulation and Hygiene: Bed level;Total assistance;+2 for physical assistance;Cueing for back precautions       Functional mobility during ADLs: +2 for physical assistance General ADL Comments: anticipate need for bed level ADLs due to increased pain  Vision Baseline Vision/History: 1 Wears glasses Ability to See in Adequate Light: 0 Adequate              Pertinent Vitals/Pain Pain Assessment Pain Assessment: Faces Faces Pain Scale: Hurts worst Pain Location: back Pain Descriptors / Indicators: Crushing, Discomfort, Grimacing, Guarding, Moaning Pain Intervention(s): Limited activity within patient's  tolerance, Monitored during session, Premedicated before session, Repositioned     Extremity/Trunk Assessment Upper Extremity Assessment Upper Extremity Assessment: Right hand dominant;RUE deficits/detail;LUE deficits/detail RUE: Unable to fully assess due to pain LUE: Unable to fully assess due to pain   Lower Extremity Assessment Lower Extremity Assessment: Defer to PT evaluation RLE Deficits / Details: did not stand RLE: Unable to fully assess due to pain LLE: Unable to fully assess due to pain   Cervical / Trunk Assessment Cervical / Trunk Assessment: Other exceptions Cervical / Trunk Exceptions: spinal mets with progression, pt crying out in pain with slightest movement   Communication Communication Communication: No apparent difficulties   Cognition Arousal: Alert Behavior During Therapy: WFL for tasks assessed/performed, Anxious Cognition: Difficult to assess Difficult to assess due to:  (level of pain, no apparent impairments, will continue to assess)                             Following commands: Intact       Cueing  General Comments   Cueing Techniques: Verbal cues  Pain limiting evaluation. VSS on 2L O2.           Home Living Family/patient expects to be discharged to:: Private residence Living Arrangements: Alone   Type of Home: House Home Access: Stairs to enter Secretary/administrator of Steps: 4 Entrance Stairs-Rails: Right;Left Home Layout: One level     Bathroom Shower/Tub: Chief Strategy Officer: Standard     Home Equipment: Agricultural Consultant (2 wheels)          Prior Functioning/Environment Prior Level of Function : Independent/Modified Independent;Driving             Mobility Comments: was  ambulating  w/ no device until recently ADLs Comments: independent    OT Problem List: Pain;Decreased coordination;Impaired balance (sitting and/or standing);Decreased activity tolerance;Decreased range of  motion;Decreased strength;Decreased knowledge of use of DME or AE;Decreased knowledge of precautions   OT Treatment/Interventions: Self-care/ADL training;Therapeutic exercise;Neuromuscular education;Energy conservation;DME and/or AE instruction;Therapeutic activities;Balance training;Patient/family education      OT Goals(Current goals can be found in the care plan section)   Acute Rehab OT Goals OT Goal Formulation: With patient Time For Goal Achievement: 04/22/24 Potential to Achieve Goals: Fair   OT Frequency:  Min 2X/week    Co-evaluation PT/OT/SLP Co-Evaluation/Treatment: Yes Reason for Co-Treatment: Complexity of the patient's impairments (multi-system involvement) PT goals addressed during session: Mobility/safety with mobility OT goals addressed during session: ADL's and self-care      AM-PAC OT 6 Clicks Daily Activity     Outcome Measure Help from another person eating meals?: A Little Help from another person taking care of personal grooming?: A Little Help from another person toileting, which includes using toliet, bedpan, or urinal?: Total Help from another person bathing (including washing, rinsing, drying)?: Total Help from another person to put on and taking off regular upper body clothing?: Total Help from another person to put on and taking off regular lower body clothing?: Total 6 Click Score: 10   End of Session Equipment Utilized During Treatment: Oxygen Nurse Communication: Mobility status  Activity Tolerance: Patient limited by pain Patient left: in bed;with call bell/phone within reach;with nursing/sitter in room  OT Visit Diagnosis: Pain;Unsteadiness on feet (R26.81);Muscle weakness (generalized) (M62.81);Other abnormalities of gait and mobility (R26.89) Pain - Right/Left:  (back) Pain - part of body:  (back)                Time: 9191-9179 OT Time Calculation (min): 12 min Charges:  OT General Charges $OT Visit: 1 Visit  Ysabel Stankovich L.  Kimberly Coye, OTR/L  04/08/24, 2:38 PM

## 2024-04-08 NOTE — ED Notes (Signed)
 Cath bag emptied and ginger ale provided at pts request.

## 2024-04-08 NOTE — Progress Notes (Addendum)
 PROGRESS NOTE    David Townsend  [name] DOB: 25-Nov-1960 DOA: 04/07/2024 PCP: Lorren Greig PARAS, NP    Brief Narrative:   David Townsend is a 63 y.o. male with past medical history significant for prostate cancer with metastasis to bone, anemia of chronic medical disease, HTN, HLD, GERD, pain of malignancy, tobacco/alcohol use disorder who presented to [facility] ED on 04/07/2024 with progressive lower back pain, weakness.  Patient reports ongoing for a few weeks with now limitation with ability to perform ADLs.  Also reports poor appetite.  Recently had an increase in his oxycodone  from 5 to 10 mg, but still has had not significant relief with this.  Does endorse drinking 3-4 mixed drinks and 3-4 beers per day and smoking 1 pack/day of cigarettes.  Denies any recent trauma or falls.  No nausea/vomiting, fever/chills, no shortness of breath, no chest pain.  Patient has upcoming appointment with pain management clinic but given pain intolerable he sought further care in the ED.  In the ED, temperature 97.8 F, HR 93, RR 16, BP 167/94, SpO2 96% room air.  WBC 9.5, hemoglobin 0.5, platelet count 185.  Sodium 131, potassium 3.9, chloride 96, CO2 21, glucose 92, BUN 7, creatinine 0.80.  AST 41, ALT 18, total bilirubin 0.5.  EtOH level less than 15.  EKG with NSR.  Patient received 1 L NS bolus, IV Dilaudid  1 mg x 3.  TRH consulted for admission for further evaluation and management of intractable pain of malignancy.  Assessment & Plan:   Intractable pain of malignancy Prostate cancer metastasis to bone, progressive Patient presenting with progressive low back pain now with difficulty ambulating/performing ADLs.  Recently seen in the ED on 10/19, 10/24, 10/29 for same.  MR L-spine with without contrast 10/24 with progressive osseous metastasis throughout the lumbar and visualized lower thoracic spine without extraosseous extension or pathologic fracture, progressive moderate foraminal narrowing L3-4, right  greater than left; progressive moderate subarticular and foraminal stenosis at L4-5, worse on the right, progressive moderate right and mild left foraminal stenosis at L5-S1.  CT angiogram chest/abdomen/pelvis 11/27/2023 also notes sclerotic metastasis posterior medial right ilium, posterior left ilium. -- MS IR 15mg  PO q4h PRN severe pain -- Dilaudid  1 mg IV q4h PRN severe pain -- Robaxin 500 mg p.o. PO q6h PRN muscle spasms -- Lidocaine  patch -- Continue Erleada  120 mg PO daily -- Urology consulted for evaluation for change in therapy given progression of disease -- Palliative care consulted for assistance in pain management -- Radiation oncology consulted to assess  Hyponatremia Sodium 131 on admission, likely secondary to dehydration in the setting of poor oral intake, EtOH use outpatient. -- Na 131>134 -- NS at 100 mL/h -- BMP in am  Hypomagnesemia Magnesium  1.6, will replete. -- Repeat electrolytes in a.m.  HTN -- Amlodipine  10 mg p.o. daily -- Irbesartan  75 mg p.o. daily (substituted for home valsartan  40mg  PO daily) -- Hydralazine 10 mg IV every 8 hours as needed SBP >180  HLD -- Atorvastatin  20 mg p.o. daily  GERD Currently not taking PPI outpatient.  EtOH use disorder Endorses drinking 3-4 mixed drinks and 3-4 beers per day.  EtOH level less than 15.  Counseled on need for complete cessation/abstinence. -- CIWA protocol with symptom triggered Ativan   Tobacco use disorder Dors is smoking 1 pack/day of cigarettes.  Counseled on need for complete cessation.   -- Nicotine  patch offered, declined.  Weakness/ability/deconditioning: -- PT/OT evaluation   DVT prophylaxis: enoxaparin  (LOVENOX ) injection 40 mg Start:  04/08/24 1000    Code Status: Full Code Family Communication: No family present at bedside  Disposition Plan:  Level of care: Telemetry Status is: Observation The patient remains OBS appropriate and will d/c before 2 midnights.    Consultants:   Palliative care Urology Radiation oncology  Procedures:  None  Antimicrobials:  None   Subjective: Patient seen examined bedside, lying in bed.  Remains in ED holding area.  PT/OT in room.  Continues with significant low back pain, with difficulty sitting on edge of bed.  Discussed with palliative care this morning and urology.  Will need to optimize pain control, consulting radiation oncology to see if radiation treatments would be of benefit to this patient.  Patient denies any saddle anesthesia, no paresthesias to his lower extremities, no difficulty urinating although has Foley catheter in place which is chronic.  Patient without any other complaints or concerns at this time.  Denies headache, no vision changes, no chest pain, no palpitations, no shortness of breath, no abdominal pain, no fever/chills/night sweats, no nausea/vomiting/diarrhea, no focal weakness, no fatigue, no paresthesias.  No acute events overnight per nursing staff.  Objective: Vitals:   04/08/24 0810 04/08/24 0830 04/08/24 1000 04/08/24 1005  BP: (!) 160/92  137/81 137/81  Pulse: 92   91  Resp: 16     Temp:  97.9 F (36.6 C)    SpO2: 97%     Weight:      Height:        Intake/Output Summary (Last 24 hours) at 04/08/2024 1037 Last data filed at 04/08/2024 9074 Gross per 24 hour  Intake 39.08 ml  Output 1050 ml  Net -1010.92 ml   Filed Weights   04/07/24 1836  Weight: 70.3 kg    Examination:  Physical Exam: GEN: NAD, alert and oriented x 3, chronically ill in appearance HEENT: NCAT, PERRL, EOMI, sclera clear, MMM PULM: CTAB w/o wheezes/crackles, normal respiratory effort, on room air CV: RRR w/o M/G/R GI: abd soft, NTND, + BS GU: Foley catheter noted MSK: no peripheral edema, moves all extremities independently, some range of motion limited by acute pain NEURO: CN II-XII intact, no focal deficits, sensation to light touch intact PSYCH: normal mood/affect Integumentary: No concerning  rashes/lesions/wounds on exposed skin surfaces    Data Reviewed: I have personally reviewed following labs and imaging studies  CBC: Recent Labs  Lab 04/07/24 1841 04/08/24 0557  WBC 9.5 8.6  NEUTROABS 6.6  --   HGB 11.5* 11.3*  HCT 35.2* 33.2*  MCV 114.3* 114.9*  PLT 185 171   Basic Metabolic Panel: Recent Labs  Lab 04/07/24 1841 04/08/24 0557  NA 131* 134*  K 3.9 4.0  CL 96* 101  CO2 21* 23  GLUCOSE 92 98  BUN 7* 7*  CREATININE 0.80 0.71  CALCIUM  10.0 9.4  MG  --  1.6*  PHOS  --  3.2   GFR: Estimated Creatinine Clearance: 94 mL/min (by C-G formula based on SCr of 0.71 mg/dL). Liver Function Tests: Recent Labs  Lab 04/07/24 1841  AST 41  ALT 18  ALKPHOS 257*  BILITOT 0.5  PROT 6.7  ALBUMIN 4.2   No results for input(s): LIPASE, AMYLASE in the last 168 hours. No results for input(s): AMMONIA in the last 168 hours. Coagulation Profile: No results for input(s): INR, PROTIME in the last 168 hours. Cardiac Enzymes: No results for input(s): CKTOTAL, CKMB, CKMBINDEX, TROPONINI in the last 168 hours. BNP (last 3 results) No results for input(s): PROBNP in  the last 8760 hours. HbA1C: No results for input(s): HGBA1C in the last 72 hours. CBG: No results for input(s): GLUCAP in the last 168 hours. Lipid Profile: No results for input(s): CHOL, HDL, LDLCALC, TRIG, CHOLHDL, LDLDIRECT in the last 72 hours. Thyroid  Function Tests: No results for input(s): TSH, T4TOTAL, FREET4, T3FREE, THYROIDAB in the last 72 hours. Anemia Panel: No results for input(s): VITAMINB12, FOLATE, FERRITIN, TIBC, IRON , RETICCTPCT in the last 72 hours. Sepsis Labs: No results for input(s): PROCALCITON, LATICACIDVEN in the last 168 hours.  No results found for this or any previous visit (from the past 240 hours).       Radiology Studies: No results found.      Scheduled Meds:  acetaminophen   1,000 mg Oral TID    amLODipine   10 mg Oral Daily   apalutamide   120 mg Oral Daily   atorvastatin   20 mg Oral Daily   enoxaparin  (LOVENOX ) injection  40 mg Subcutaneous Q24H   folic acid   1 mg Oral Daily   irbesartan   75 mg Oral Daily   lidocaine   1 patch Transdermal Q24H   multivitamin with minerals  1 tablet Oral Daily   senna-docusate  1 tablet Oral QHS   thiamine   100 mg Oral Daily   Or   thiamine   100 mg Intravenous Daily   Continuous Infusions:   LOS: 0 days    Time spent: 53 minutes spent on 04/08/2024 caring for this patient face-to-face including chart review, ordering labs/tests, documenting, discussion with nursing staff, consultants, updating family and interview/physical exam    Camellia PARAS Lester Platas, DO [facility] Available via Epic secure chat 7am-7pm After these hours, please refer to coverage provider listed on [email or web address] 04/08/2024, 10:37 AM

## 2024-04-08 NOTE — Evaluation (Signed)
 Physical Therapy Evaluation Patient Details Name: David Townsend MRN: [id number] DOB: December 07, 1960 Today's Date: 04/08/2024  History of Present Illness  63 yo male presents 04/07/24 with complaints of back ,rib, various  areas of pain, inability to ambulate.SABRAPMH: multiple rib fxs, T4 pathologic fx,prostate cancer with bony mets, indwelling catheter, 10/24 MRI Progressive metastases are present throughout the lumbar and visualized lower  thoracic spine.  Clinical Impression  The patient reporting severe back pain and unable to progress to sitting  or standing.  Patient resides alone and has been ambulatory until progressive  pain and  inability to ambulate due to pain. Patient will benefit from continued inpatient follow up therapy, <3 hours/day         If plan is discharge home, recommend the following: Two people to help with walking and/or transfers;A lot of help with bathing/dressing/bathroom;Assist for transportation;Help with stairs or ramp for entrance   Can travel by private vehicle   No    Equipment Recommendations None recommended by PT  Recommendations for Other Services       Functional Status Assessment Patient has had a recent decline in their functional status and demonstrates the ability to make significant improvements in function in a reasonable and predictable amount of time.     Precautions / Restrictions Precautions Precautions: Fall Precaution/Restrictions Comments: severe back paibn Restrictions Weight Bearing Restrictions Per Provider Order: No      Mobility  Bed Mobility Overal bed mobility: Needs Assistance Bed Mobility: Supine to Sit           General bed mobility comments: pulled self forward  in semi long sitting and returned immediately to supine with reports of severe pain.    Transfers                   General transfer comment: unable    Ambulation/Gait                  Stairs            Wheelchair Mobility      Tilt Bed    Modified Rankin (Stroke Patients Only)       Balance Overall balance assessment: Needs assistance     Sitting balance - Comments: unable to fully test due to pain                                     Pertinent Vitals/Pain Pain Assessment Pain Assessment: Faces Faces Pain Scale: Hurts worst Pain Location: patient moaning grimacing when sitting forward or attempt to roll. Pain Descriptors / Indicators: Crushing, Discomfort, Grimacing, Guarding, Moaning Pain Intervention(s): Limited activity within patient's tolerance, Premedicated before session, Repositioned    Home Living Family/patient expects to be discharged to:: Private residence Living Arrangements: Alone   Type of Home: House Home Access: Stairs to enter Entrance Stairs-Rails: Doctor, General Practice of Steps: 4   Home Layout: One level Home Equipment: Agricultural Consultant (2 wheels)      Prior Function Prior Level of Function : Independent/Modified Independent;Driving             Mobility Comments: was  ambulating  w/ no device until recently       Extremity/Trunk Assessment        Lower Extremity Assessment Lower Extremity Assessment: RLE deficits/detail;LLE deficits/detail RLE Deficits / Details: did not stand RLE: Unable to fully assess due to pain LLE: Unable to fully assess due  to pain    Cervical / Trunk Assessment Cervical / Trunk Assessment: Other exceptions Cervical / Trunk Exceptions: guarding when attempting to sit forward  Communication   Communication Communication: No apparent difficulties    Cognition Arousal: Alert Behavior During Therapy: WFL for tasks assessed/performed, Anxious   PT - Cognitive impairments: No apparent impairments                         Following commands: Intact       Cueing       General Comments      Exercises     Assessment/Plan    PT Assessment Patient needs continued PT services  PT  Problem List Decreased strength;Decreased range of motion;Decreased activity tolerance;Decreased balance;Decreased mobility;Pain       PT Treatment Interventions DME instruction;Functional mobility training;Therapeutic activities;Therapeutic exercise;Patient/family education    PT Goals (Current goals can be found in the Care Plan section)  Acute Rehab PT Goals Patient Stated Goal: no pain PT Goal Formulation: With patient Time For Goal Achievement: 04/22/24 Potential to Achieve Goals: Fair    Frequency Min 2X/week     Co-evaluation PT/OT/SLP Co-Evaluation/Treatment: Yes Reason for Co-Treatment: Complexity of the patient's impairments (multi-system involvement) PT goals addressed during session: Mobility/safety with mobility OT goals addressed during session: ADL's and self-care       AM-PAC PT 6 Clicks Mobility  Outcome Measure Help needed turning from your back to your side while in a flat bed without using bedrails?: A Little Help needed moving from lying on your back to sitting on the side of a flat bed without using bedrails?: A Lot Help needed moving to and from a bed to a chair (including a wheelchair)?: Total Help needed standing up from a chair using your arms (e.g., wheelchair or bedside chair)?: Total Help needed to walk in hospital room?: Total Help needed climbing 3-5 steps with a railing? : Total 6 Click Score: 9    End of Session   Activity Tolerance: Patient limited by pain Patient left: in bed;with call bell/phone within reach Nurse Communication: Mobility status PT Visit Diagnosis: Unsteadiness on feet (R26.81);Pain    Time: 9190-9178 PT Time Calculation (min) (ACUTE ONLY): 12 min   Charges:   PT Evaluation $PT Eval Low Complexity: 1 Low   PT General Charges $$ ACUTE PT VISIT: 1 Visit         Darice Potters PT Acute Rehabilitation Services Office [phone]   Potters Darice Norris 04/08/2024, 2:03 PM

## 2024-04-08 NOTE — Consult Note (Signed)
 Urology Consult Note   Requesting Attending Physician:  Townsend, David J, DO Service Providing Consult: Urology  Consulting Attending: Dr. Renda   Reason for Consult: Cancer-related pain  HPI: David Townsend is seen in consultation for reasons noted above at the request of Townsend, David PARAS, DO. Patient is a 63 y.o. male presenting with lower back pain, weakness, and difficulty performing ADLs.  He reports around 8 drinks and 1 pack of cigarettes per day.  PMH significant for prostate cancer with metastasis to bone, anemia of chronic disease, HTN, HLD, GERD, tobacco and alcohol use disorder, a reflexive bladder with chronic Foley catheter, and pain of malignancy.  Patient is known to our practice and is followed by Dr. Alvaro in our advanced prostate cancer clinic.  His initial diagnosis was grade 5 disease in 2023 with a PSA of 403.  Most recent staging in June 2025 showed progression of diffuse bone metastasis to skull, ribs, spine, pelvis, humerus, and femurs.  He has remained on androgen deprivation therapy, Eligard, and a reduced dose of Erleada .  Out of concern for bone prominence, strong recommendation was made to reconsider Xofigo.  Shared decision was made to postpone initiation until the fall and after he had settled his father's estate.  He was last seen 12/12/2023.  [facility] was consulted to review his medication regimen and notify primary team of any changes that we felt may benefit him.  This is my first time meeting David Townsend.    On arrival to the room thankfully his pain was well-controlled.  MRI collected at OSH shows interval progression of lumbar and sacral progression.  Patient localizes his discomfort much higher in a band encircling his thorax at the epigastric level.  He denies lower extremity weakness or incontinence of bowel.  He appears to be a reliable historian, we reviewed the case and plan and all questions were answered to his  satisfaction. ------------------  Assessment:   63 y.o. male with grade 5 metastatic prostate cancer on ADT, Erleada , and Eligard   Recommendations: # Metastatic prostate cancer-bone predominant # Cancer related pain # A reflexive bladder-chronic Foley catheter  David Townsend will not be immediately available and we can proceed with this on an outpatient basis.  It would not meaningfully change his cancer related bone pain in the acute setting.  Agree with seeking guidance from palliative and RadOnc.   Foley exchange up to date.  Done within the last 2 weeks per patient.  No changes to treatment regimen or surgical intervention indicated at this time.  Please call with questions, concerns, or acute urologic changes.   Case and plan discussed with David Townsend and Dr. 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   Hyperlipidemia 03/27/2022   Penile lesion    Prostate cancer metastatic to bone (HCC) 04/23/2022   Urinary retention 04/23/2022    Past Surgical History:  Past Surgical History:  Procedure Laterality Date   NO PAST SURGERIES     PENILE BIOPSY N/A 01/12/2020   Procedure: EXCISION OF PENILE LESION;  Surgeon: Elisabeth Valli BIRCH, MD;  Location: [facility]  Service: Urology;  Laterality: N/A;    Medication: Current Facility-Administered Medications  Medication Dose Route Frequency Provider Last Rate Last Admin   acetaminophen  (TYLENOL ) tablet 1,000 mg  1,000 mg Oral TID Amponsah, Prosper M, MD   1,000 mg at 04/08/24  1000   amLODipine  (NORVASC ) tablet 10 mg  10 mg Oral Daily Amponsah, Prosper M, MD   10 mg at 04/08/24 1000   apalutamide  (ERLEADA ) tablet 120 mg  120 mg Oral Daily Lou Claretta HERO, MD       atorvastatin  (LIPITOR) tablet 20 mg  20 mg Oral Daily Amponsah, Prosper M, MD   20 mg at 04/08/24 1000   bisacodyl  (DULCOLAX) EC tablet 5 mg  5 mg Oral Daily PRN Amponsah, Prosper M, MD       enoxaparin  (LOVENOX ) injection 40 mg  40 mg Subcutaneous Q24H Amponsah, Prosper M, MD   40 mg at 04/08/24 9040   folic acid  (FOLVITE ) tablet 1 mg  1 mg Oral Daily Amponsah, Prosper M, MD   1 mg at 04/08/24 1000   hydrALAZINE (APRESOLINE) injection 10 mg  10 mg Intravenous Q8H PRN Lou Claretta HERO, MD       HYDROmorphone  (DILAUDID ) injection 1 mg  1 mg Intravenous Q4H PRN Mims, Lauren W, DO       irbesartan  (AVAPRO ) tablet 75 mg  75 mg Oral Daily Townsend, David J, DO       lidocaine  (LIDODERM ) 5 % 1 patch  1 patch Transdermal Q24H Lou Claretta HERO, MD   1 patch at 04/07/24 2337   LORazepam  (ATIVAN ) tablet 1-4 mg  1-4 mg Oral Q1H PRN Amponsah, Prosper M, MD       Or   LORazepam  (ATIVAN ) injection 1-4 mg  1-4 mg Intravenous Q1H PRN Amponsah, Prosper M, MD       methocarbamol (ROBAXIN) tablet 500 mg  500 mg Oral Q6H PRN Amponsah, Prosper M, MD   500 mg at 04/08/24 0102   morphine  (MSIR) tablet 15 mg  15 mg Oral Q4H PRN Clayton Tinnie ORN, DO   15 mg at 04/08/24 1052   multivitamin with minerals tablet 1 tablet  1 tablet Oral Daily Lou Claretta HERO, MD   1 tablet at 04/08/24 1001   ondansetron  (ZOFRAN ) tablet 4 mg  4 mg Oral Q6H PRN Amponsah, Prosper M, MD       Or   ondansetron  (ZOFRAN ) injection 4 mg  4 mg Intravenous Q6H PRN Amponsah, Prosper M, MD       senna-docusate (Senokot-S) tablet 1 tablet  1 tablet Oral QHS Lou Claretta HERO, MD       thiamine  (VITAMIN B1) tablet 100 mg  100 mg Oral Daily Lou Claretta HERO, MD   100 mg at 04/08/24 1001   Or   thiamine  (VITAMIN B1) injection 100 mg  100 mg Intravenous Daily Lou Claretta HERO, MD       Current Outpatient Medications  Medication Sig Dispense Refill   acetaminophen  (TYLENOL ) 500 MG tablet Take 500 mg by mouth every 6 (six) hours as needed for moderate pain (pain score 4-6).     amLODipine  (NORVASC ) 10 MG tablet Take 1 tablet (10 mg total) by mouth daily.  90 tablet 0   Ascorbic Acid (VITAMIN C PO) Take 1 tablet by mouth daily.     atorvastatin  (LIPITOR) 20 MG tablet Take 1 tablet (20 mg total) by mouth daily. 90 tablet 0   cyclobenzaprine (FLEXERIL) 10 MG tablet Take 1 tablet (10 mg total) by mouth every 8 (eight) hours as needed for muscle spasms. 60 tablet 0   ERLEADA  60 MG tablet Take 120 mg by mouth daily.     ibuprofen  (ADVIL ) 800 MG tablet Take 1 tablet (800 mg total) by mouth every  8 (eight) hours as needed for fever, mild pain or headache (alternate with tylenol  for fever). 30 tablet 0   Iron , Ferrous Sulfate , 325 (65 Fe) MG TABS Take 325 mg by mouth daily. 90 tablet 0   lidocaine  (LIDODERM ) 5 % Place 1 patch onto the skin daily. Remove & Discard patch within 12 hours or as directed by MD 30 patch 0   Multiple Vitamin (MULTIVITAMIN WITH MINERALS) TABS tablet Take 1 tablet by mouth daily.     Oxycodone  HCl 10 MG TABS Take 10 mg by mouth every 6 (six) hours as needed.     VITAMIN D  PO Take 1 capsule by mouth daily.     folic acid  (FOLVITE ) 1 MG tablet Take 1 tablet (1 mg total) by mouth daily. (Patient not taking: Reported on 04/08/2024)     furosemide  (LASIX ) 20 MG tablet Take 1 tablet (20 mg total) by mouth daily. (Patient not taking: Reported on 01/31/2023) 3 tablet 0   nicotine  (NICODERM CQ  - DOSED IN MG/24 HOURS) 21 mg/24hr patch Place 1 patch (21 mg total) onto the skin daily. (Patient not taking: Reported on 01/31/2023) 28 patch 0   oxyCODONE  (ROXICODONE ) 5 MG immediate release tablet Take 1 tablet (5 mg total) by mouth every 4 (four) hours as needed for severe pain (pain score 7-10). (Patient not taking: Reported on 04/08/2024) 12 tablet 0   pantoprazole  (PROTONIX ) 40 MG tablet Take 1 tablet (40 mg total) by mouth daily. (Patient not taking: Reported on 04/08/2024) 30 tablet 0   senna-docusate (SENOKOT-S) 8.6-50 MG tablet Take 1 tablet by mouth 2 (two) times daily. (Patient not taking: Reported on 01/31/2023)     thiamine  (VITAMIN B-1) 100 MG  tablet Take 1 tablet (100 mg total) by mouth daily. (Patient not taking: Reported on 01/31/2023)     valsartan  (DIOVAN ) 40 MG tablet Take 1 tablet (40 mg total) by mouth daily. 90 tablet 0    Allergies: No Known Allergies  Social History: Social History   Tobacco Use   Smoking status: Every Day    Current packs/day: 2.00    Average packs/day: 2.0 packs/day for 34.0 years (68.0 ttl pk-yrs)    Types: Cigarettes    Passive exposure: Current   Smokeless tobacco: Never  Vaping Use   Vaping status: Never Used  Substance Use Topics   Alcohol use: Yes    Alcohol/week: 7.0 standard drinks of alcohol    Types: 4 Cans of beer, 3 Shots of liquor per week    Comment: 5-6 beers per day   Drug use: No    Family History Family History  Family history unknown: Yes    Review of Systems  Genitourinary:  Negative for flank pain and hematuria.  Musculoskeletal:  Positive for back pain.     Objective   Vital signs in last 24 hours: BP (!) 172/97   Pulse 91   Temp 97.9 F (36.6 C)   Resp 16   Ht 5' 10 (1.778 m)   Wt 70.3 kg   SpO2 97%   BMI 22.24 kg/m   Physical Exam General: A&O, resting, appropriate HEENT: [place]/AT Pulmonary: Normal work of breathing Cardiovascular: no cyanosis Abdomen: Soft, NTTP, nondistended GU: foley catheter with leg bag in place   Most Recent Labs: Lab Results  Component Value Date   WBC 8.6 04/08/2024   HGB 11.3 (L) 04/08/2024   HCT 33.2 (L) 04/08/2024   PLT 171 04/08/2024    Lab Results  Component Value Date  NA 134 (L) 04/08/2024   K 4.0 04/08/2024   CL 101 04/08/2024   CO2 23 04/08/2024   BUN 7 (L) 04/08/2024   CREATININE 0.71 04/08/2024   CALCIUM  9.4 04/08/2024   MG 1.6 (L) 04/08/2024   PHOS 3.2 04/08/2024    No results found for: INR, APTT   Urine Culture: @[id number] (laburin,org,[id number],[id number])@   IMAGING: No results found.  ------  Ole Bourdon, NP Pager: [phone]   Please contact the urology consult  pager with any further questions/concerns.

## 2024-04-08 NOTE — Consult Note (Signed)
 Radiation Oncology         [phone]) [phone] ________________________________  Name: David Townsend        MRN: [id number]  Date of Service: 04/08/24 DOB: November 25, 1960  RR:Duzeyzwd, Amy J, NP     REFERRING PHYSICIAN: Dr. Austria   DIAGNOSIS: The primary encounter diagnosis was Palliative care encounter. Diagnoses of Prostate cancer metastatic to bone (HCC) and Chronic low back pain without sciatica, unspecified back pain laterality were also pertinent to this visit.   HISTORY OF PRESENT ILLNESS: David Townsend is a 63 y.o. male seen at the request of Dr. 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  The patient was originally diagnosed in 2023 after he presented with urinary obstruction and was found to have a PSA of 403.  His diagnosis was confirmed with biopsy though the results are not available in our health system to see this.  He was started on androgen deprivation therapy with Eligard and Erleada  as he was found to have diffuse bone disease.  He had valuation with a CT chest abdomen and pelvis after presenting to the emergency department at [facility] and the scan on 11/27/2023 showed no acute coronary findings but anatomic variant of the left sided SVC, left main and three-vessel coronary calcifications the most prominent in the LAD, atherosclerotic plaques in the descending aorta and great vessels without aneurysm dissection or stenosis, in the abdomen and pelvis however there was 50% proximal vessel stenosis due to compression by the medial arcuate ligament of the diaphragm with mild poststenotic vessel ectasia up to 8 mm.  No high-grade stenosis or dissection was appreciated.  That study also commented on evidence of steatosis of the liver and possible early cirrhotic change, thickening of the bladder, contracted stomach and diffuse esophageal wall thickening.  It was felt that endoscopy might be indicated.  The patient has not seen GI though his PCP has tried  referring him for baseline colonoscopy several years in a row.  The scan also showed a new bowtie compression fracture of T4 without retropulsion felt to be pathologic in nature.following this, there had been discussion about switching to Xofigo because of his prior bony disease, but ultimately this was delayed to his appointment that is scheduled for next week due to social needs at the time.  He was seen in the emergency room at the [facility] location on 03/27/2024 complaining of back pain, and an MRI of the lumbar spine with and without contrast confirmed progressive osseous disease throughout the lumbar and lower thoracic spine that could be visualized.  There did not appear to be extraosseous extension or pathologic fracture, foraminal narrowing at L3-4 right greater than left was noted.  He was discharged with pain medication and plan to be seen back in urology, but presented overnight with complaints of progressive pain.  We have been asked to evaluate the patient to consider palliative radiation.  Urology has seen him today, and plans to see him as an outpatient to discuss change in systemic therapy.    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 He has a 68 pack-year smoking  history. He has been exposed to tobacco smoke. He has never used smokeless tobacco. He reports current alcohol use of about 7.0 standard drinks of alcohol per week. He reports that he does not use drugs.  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  He states that he had to settle his father's estate this year but states he is financially in a good place.  He does not have any living family members.  ALLERGIES: Patient has no known allergies.   MEDICATIONS:  Current Facility-Administered Medications  Medication Dose Route Frequency Provider Last Rate Last Admin   acetaminophen  (TYLENOL ) tablet 1,000 mg  1,000 mg Oral TID Amponsah, Prosper M, MD   1,000 mg at 04/08/24 1000   amLODipine  (NORVASC ) tablet 10 mg  10 mg Oral Daily Amponsah, Prosper M, MD   10 mg at 04/08/24 1000   apalutamide  (ERLEADA ) tablet 120 mg  120 mg Oral Daily Lou Claretta HERO, MD       atorvastatin  (LIPITOR) tablet 20 mg  20 mg Oral Daily Amponsah, Prosper M, MD   20 mg at 04/08/24 1000   bisacodyl (DULCOLAX) EC tablet 5 mg  5 mg Oral Daily PRN Amponsah, Prosper M, MD       enoxaparin  (LOVENOX ) injection 40 mg  40 mg Subcutaneous Q24H Amponsah, Prosper M, MD   40 mg at 04/08/24 9040   folic acid  (FOLVITE ) tablet 1 mg  1 mg Oral Daily Amponsah, Prosper M, MD   1 mg at 04/08/24 1000   hydrALAZINE (APRESOLINE) injection 10 mg  10 mg Intravenous Q8H PRN Amponsah, Prosper M, MD       HYDROmorphone  (DILAUDID ) injection 1 mg  1 mg Intravenous Q4H PRN Mims, Lauren W, DO       irbesartan  (AVAPRO ) tablet 75 mg  75 mg Oral Daily Austria, Eric J, DO       lidocaine  (LIDODERM ) 5 % 1 patch  1 patch Transdermal Q24H Lou Claretta HERO, MD   1 patch at 04/07/24 2337   LORazepam  (ATIVAN ) tablet 1-4 mg  1-4 mg Oral Q1H PRN Amponsah, Prosper M, MD       Or   LORazepam  (ATIVAN ) injection 1-4 mg  1-4 mg Intravenous Q1H PRN Amponsah, Prosper M, MD       methocarbamol (ROBAXIN) tablet 500 mg  500 mg  Oral Q6H PRN Amponsah, Prosper M, MD   500 mg at 04/08/24 0102   morphine  (MSIR) tablet 15 mg  15 mg Oral Q4H PRN Clayton Tinnie ORN, DO   15 mg at 04/08/24 1052   multivitamin with minerals tablet 1 tablet  1 tablet Oral Daily Lou Claretta HERO, MD   1 tablet at 04/08/24 1001   nicotine  (NICODERM CQ  - dosed in mg/24 hours) patch 21 mg  21 mg Transdermal Daily Austria, Eric J, DO       ondansetron  (ZOFRAN ) tablet 4 mg  4 mg Oral Q6H PRN Lou Claretta HERO, MD       Or   ondansetron  (ZOFRAN ) injection 4 mg  4 mg Intravenous  Q6H PRN Amponsah, Prosper M, MD       senna-docusate (Senokot-S) tablet 1 tablet  1 tablet Oral QHS Lou Claretta HERO, MD       thiamine  (VITAMIN B1) tablet 100 mg  100 mg Oral Daily Lou Claretta HERO, MD   100 mg at 04/08/24 1001   Or   thiamine  (VITAMIN B1) injection 100 mg  100 mg Intravenous Daily Amponsah, Prosper M, MD       Current Outpatient Medications  Medication Sig Dispense Refill   acetaminophen  (TYLENOL ) 500 MG tablet Take 500 mg by mouth every 6 (six) hours as needed for moderate pain (pain score 4-6).     amLODipine  (NORVASC ) 10 MG tablet Take 1 tablet (10 mg total) by mouth daily. 90 tablet 0   Ascorbic Acid (VITAMIN C PO) Take 1 tablet by mouth daily.     atorvastatin  (LIPITOR) 20 MG tablet Take 1 tablet (20 mg total) by mouth daily. 90 tablet 0   cyclobenzaprine (FLEXERIL) 10 MG tablet Take 1 tablet (10 mg total) by mouth every 8 (eight) hours as needed for muscle spasms. 60 tablet 0   ERLEADA  60 MG tablet Take 120 mg by mouth daily.     ibuprofen  (ADVIL ) 800 MG tablet Take 1 tablet (800 mg total) by mouth every 8 (eight) hours as needed for fever, mild pain or headache (alternate with tylenol  for fever). 30 tablet 0   Iron , Ferrous Sulfate , 325 (65 Fe) MG TABS Take 325 mg by mouth daily. 90 tablet 0   lidocaine  (LIDODERM ) 5 % Place 1 patch onto the skin daily. Remove & Discard patch within 12 hours or as directed by MD 30 patch 0   Multiple Vitamin  (MULTIVITAMIN WITH MINERALS) TABS tablet Take 1 tablet by mouth daily.     Oxycodone  HCl 10 MG TABS Take 10 mg by mouth every 6 (six) hours as needed.     VITAMIN D  PO Take 1 capsule by mouth daily.     folic acid  (FOLVITE ) 1 MG tablet Take 1 tablet (1 mg total) by mouth daily. (Patient not taking: Reported on 04/08/2024)     furosemide  (LASIX ) 20 MG tablet Take 1 tablet (20 mg total) by mouth daily. (Patient not taking: Reported on 01/31/2023) 3 tablet 0   nicotine  (NICODERM CQ  - DOSED IN MG/24 HOURS) 21 mg/24hr patch Place 1 patch (21 mg total) onto the skin daily. (Patient not taking: Reported on 01/31/2023) 28 patch 0   oxyCODONE  (ROXICODONE ) 5 MG immediate release tablet Take 1 tablet (5 mg total) by mouth every 4 (four) hours as needed for severe pain (pain score 7-10). (Patient not taking: Reported on 04/08/2024) 12 tablet 0   pantoprazole  (PROTONIX ) 40 MG tablet Take 1 tablet (40 mg total) by mouth daily. (Patient not taking: Reported on 04/08/2024) 30 tablet 0   senna-docusate (SENOKOT-S) 8.6-50 MG tablet Take 1 tablet by mouth 2 (two) times daily. (Patient not taking: Reported on 01/31/2023)     thiamine  (VITAMIN B-1) 100 MG tablet Take 1 tablet (100 mg total) by mouth daily. (Patient not taking: Reported on 01/31/2023)     valsartan  (DIOVAN ) 40 MG tablet Take 1 tablet (40 mg total) by mouth daily. 90 tablet 0     REVIEW OF SYSTEMS: On review of systems, the patient reports that he is doing okay.  He states that in the last year specifically since this summer he has had less energy to for fill tasks like caring for his yard.  He states that he has not golfed in at least a year and a half for the same reason.  He states that he has been catheterized for approximately 3 to 4 years and has this changed in urology monthly.  He states that he is not having any loss of control of his bowel function, difficulty with foot drop or loss of sensory or motor function otherwise.  He states his pain is in the  upper part of his abdomen/lower chest and involves his shoulder blades bilaterally and wraps like a vice around and affects his upper abdomen.  He states his pain has progressively worsened in the last few weeks and his pain medication that had previously been helpful was no longer working which prompted  his evaluation today.  He is already scheduled for a routine outpatient appointment with Dr. Alvaro next week.  No other complaints are verbalized.    PHYSICAL EXAM:  Wt Readings from Last 3 Encounters:  04/07/24 154 lb 15.7 oz (70.3 kg)  04/01/24 154 lb 15.7 oz (70.3 kg)  03/22/24 155 lb (70.3 kg)   Temp Readings from Last 3 Encounters:  04/08/24 97.9 F (36.6 C)  04/01/24 97.8 F (36.6 C)  03/27/24 98.2 F (36.8 C) (Oral)   BP Readings from Last 3 Encounters:  04/08/24 (!) 172/97  04/01/24 (!) 105/59  03/27/24 128/89   Pulse Readings from Last 3 Encounters:  04/08/24 91  04/01/24 83  03/27/24 85   Pain Assessment Pain Score: 5 /10  Unable to assess given encounter type   ECOG = 2  0 - Asymptomatic (Fully active, able to carry on all predisease activities without restriction)  1 - Symptomatic but completely ambulatory (Restricted in physically strenuous activity but ambulatory and able to carry out work of a light or sedentary nature. For example, light housework, office work)  2 - Symptomatic, <50% in bed during the day (Ambulatory and capable of all self care but unable to carry out any work activities. Up and about more than 50% of waking hours)  3 - Symptomatic, >50% in bed, but not bedbound (Capable of only limited self-care, confined to bed or chair 50% or more of waking hours)  4 - Bedbound (Completely disabled. Cannot carry on any self-care. Totally confined to bed or chair)  5 - Death   Raylene MM, Creech RH, Tormey DC, et al. [phone]). Toxicity and response criteria of the [facility]. Am. DOROTHA Bridges. Oncol. 5 (6): 649-55    LABORATORY  DATA:  Lab Results  Component Value Date   WBC 8.6 04/08/2024   HGB 11.3 (L) 04/08/2024   HCT 33.2 (L) 04/08/2024   MCV 114.9 (H) 04/08/2024   PLT 171 04/08/2024   Lab Results  Component Value Date   NA 134 (L) 04/08/2024   K 4.0 04/08/2024   CL 101 04/08/2024   CO2 23 04/08/2024   Lab Results  Component Value Date   ALT 18 04/07/2024   AST 41 04/07/2024   ALKPHOS 257 (H) 04/07/2024   BILITOT 0.5 04/07/2024      RADIOGRAPHY: MR Lumbar Spine W Wo Contrast Result Date: 03/27/2024 EXAM: MR Lumbar Spine with and without intravenous contrast. 03/27/2024 04:42:52 PM TECHNIQUE: Multiplanar multisequence MRI of the lumbar spine was performed with and without the administration of intravenous contrast. COMPARISON: MRI of the lumbar spine 17/24. CLINICAL HISTORY: Metastatic disease evaluation. Chronic back pain. History of prostate cancer. Recent PET scan shown metastases. Seen at [facility] and given oxy  but no relief. FINDINGS: BONES AND ALIGNMENT: Progressive metastases are present throughout the lumbar and visualized lower thoracic spine. Diffuse enhancement is present throughout the L2 vertebral body. A 15 mm lesion is present along the posterior inferior L2 vertebral body. A lesion anteriorly on the left at L4 measures up to 21 mm. Enhancing lesion extends through the left pedicle of L2 and into the posterior elements. A left lateral lesion at T12 extends into the left pedicle measuring 3.4 cm. No extraosseous extension of metastases is present. No pathologic fractures are present. Normal alignment. SPINAL CORD: The conus terminates normally. SOFT TISSUES: No acute abnormality. T12-L1: A left lateral lesion at T12 extends into the left pedicle measuring 3.4 cm. No disc herniation. No spinal canal stenosis or neural foraminal narrowing. L1-L2: No disc herniation. No spinal canal stenosis or neural foraminal narrowing. L2-L3: Diffuse enhancement is present throughout the L2 vertebral body. A 15 mm  lesion is present along the posterior inferior L2 vertebral body. Enhancing lesion extends through the left pedicle of L2 and into the posterior elements. No disc herniation. No spinal canal stenosis or neural foraminal narrowing. L3-L4: A broad-based disc protrusion is present. Moderate foraminal narrowing has progressed bilaterally, right greater than left. L4-L5: A lesion anteriorly on the left at L4 measures up to 21 mm. A broad-based disc protrusion and moderate facet hypertrophy is present. Progressive moderate subarticular and foraminal stenosis is worse right than left. L5-S1: A rightward disc protrusion and moderate facet hypertrophy results in progressive moderate right and mild left foraminal stenosis similar to the prior study. IMPRESSION: 1. Progressive osseous metastases throughout the lumbar and visualized lower thoracic spine without extraosseous extension or pathologic fracture. 2. Progressive moderate foraminal narrowing at L3-4, right greater than left. 3. Progressive moderate subarticular and foraminal stenosis at L4-5, worse on the right. 4. Progressive moderate right and mild left foraminal stenosis at L5-S1, similar to prior study. Electronically signed by: Lonni Necessary MD 03/27/2024 05:06 PM EDT RP Workstation: [id number]       IMPRESSION/PLAN: 1. Progressive Metastatic Prostate Cancer involving the bones of the pelvis and spine. Dr. Dewey has reviewed the imaging to date, the patient's last CT scan in June and MRI of the L-spine were evaluated.  The patient does have other focal findings from his CT scan that remain indeterminate including celiac vessel stenosis and esophageal thickening.  Regarding his pain however we talked about how he has had thoracic disease documented in his spine that is felt to be metastatic prostate cancer and given the location of his symptoms that localized to the thoracic dermatomes between T4 and 8, we recommend an MRI of the thoracic spine.  If  disease is confirmed in this area, Dr. Dewey would consider palliative radiation and would offer 10 fractions of radiation Monday through Friday.  The patient also had a pathologic compression fracture at T4 on his most recent CT scan, we will also reach out to interventional radiology to see if they think it would be reasonable to consider osteocool plus or minus vertebral augmentation.  The patient is open to conversation about this.  We discussed risks, benefits, short and long-term effects of palliative radiotherapy and the patient is interested in proceeding pending his workup with MRI.  I offered to schedule simulation tomorrow but he declines and would like to wait to make plans for radiotherapy after his MRI scan.    In a visit lasting 65 minutes, greater than 50% of the time was spent by phone and  in floor time discussing the patient's condition, in preparation for the discussion, and coordinating the patient's care.        Donald KYM Husband, [facility]   **Disclaimer: This note was dictated with voice recognition software. Similar sounding words can inadvertently be transcribed and this note may contain transcription errors which may not have been corrected upon publication of note.**

## 2024-04-08 NOTE — Progress Notes (Signed)
 Chaplain responded to spiritual care consult for advance directive. Paperwork and information provided and all questions answered at this time. Signe stated that he will go over the documents with a friend of his.   Chaplains remain available as further needs arise.

## 2024-04-08 NOTE — H&P (Addendum)
 History and Physical  David Townsend [name] DOB: [birth year] DOA: 04/07/2024  PCP: Lorren Greig PARAS, NP   Chief Complaint: Back pain  HPI: David Townsend is a 63 y.o. 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 Patient reports that he started having worsening back pain a few days ago. The pain has limited his ability to function with difficulty laying down, getting up from a supine position and ambulating. He recently had an increase in his oxycodone  from 5 to 10 mg but still has not had significant relief from this.  He denies any trauma or falls. He endorsed poor appetite and occasional abdominal and flank pain but denies any nausea, vomiting, fever, chills, shortness of breath or chest pain.  States he has an appointment with a pain clinic next week but was unable to tolerate his pain enough until this appointment. Reports he usually drinks 3-4 mixed drinks and 3-4 beers per day but has only had 1 beer prior to the ED visit.  He smokes 1 pack/day of cigarettes.  ED Course: Initial vitals show patient afebrile but hypertensive with SBP in the 150-170s. Initial labs significant for sodium 131, bicarb 21, alk phos 257, Hgb 11.5 otherwise normal renal function and LFTs. EKG shows sinus rhythm. Pt received IV NS 1 L bolus, IV Dilaudid  1 mg x 3. TRH was consulted for admission for pain control.   Review of Systems: Please see HPI for pertinent positives and negatives. A complete 10 system review of systems are otherwise negative.  Past Medical History:  Diagnosis Date   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   Essential hypertension 04/23/2022   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   Hyperlipidemia 03/27/2022   Penile lesion    Prostate cancer metastatic to bone (HCC) 04/23/2022    Urinary retention 04/23/2022   Past Surgical History:  Procedure Laterality Date   NO PAST SURGERIES     PENILE BIOPSY N/A 01/12/2020   Procedure: EXCISION OF PENILE LESION;  Surgeon: Elisabeth Valli BIRCH, MD;  Location: [facility];  Service: Urology;  Laterality: N/A;   Social History:  reports that he has been smoking cigarettes. He has a 68 pack-year smoking history. He has been exposed to tobacco smoke. He has never used smokeless tobacco. He reports current alcohol use of about 7.0 standard drinks of alcohol per week. He reports that he does not use drugs.  No Known Allergies  Family History  Family history unknown: Yes     Prior to Admission medications   Medication Sig Start Date End Date Taking? Authorizing Provider  Oxycodone  HCl 10 MG TABS Take 10 mg by mouth every 6 (six) hours as needed. 03/25/24  Yes [provider]  amLODipine  (NORVASC ) 10 MG tablet Take 1 tablet (10 mg total) by mouth daily. 11/27/23   Lorren Greig PARAS, NP  amLODipine  (NORVASC ) 10 MG tablet Take 1 tablet (10 mg total) by mouth daily. 12/04/23 03/03/24  Lorren Greig PARAS, NP  Ascorbic Acid (VITAMIN C PO) Take 1 tablet by mouth daily.    [provider]  atorvastatin  (LIPITOR) 20 MG tablet Take 1 tablet (20 mg total) by mouth daily. 12/04/23   Lorren Greig PARAS, NP  cyclobenzaprine (FLEXERIL) 10 MG tablet Take 1 tablet (10 mg total) by mouth every 8 (eight) hours as needed  for muscle spasms. 03/27/24   Veta Palma, PA-C  ERLEADA  60 MG tablet Take 120 mg by mouth daily. 02/20/22   [provider]  folic acid  (FOLVITE ) 1 MG tablet Take 1 tablet (1 mg total) by mouth daily. 10/04/22   Jillian Buttery, MD  furosemide  (LASIX ) 20 MG tablet Take 1 tablet (20 mg total) by mouth daily. Patient not taking: Reported on 01/31/2023 10/11/22   Lorren Greig PARAS, NP  ibuprofen  (ADVIL ) 800 MG tablet Take 1 tablet (800 mg total) by mouth every 8 (eight) hours as needed for fever, mild pain or  headache (alternate with tylenol  for fever). Patient not taking: Reported on 01/31/2023 10/03/22   Jillian Buttery, MD  Iron , Ferrous Sulfate , 325 (65 Fe) MG TABS Take 325 mg by mouth daily. 11/27/23   Lorren Greig PARAS, NP  lidocaine  (LIDODERM ) 5 % Place 1 patch onto the skin daily. Remove & Discard patch within 12 hours or as directed by MD 03/27/24   Veta Palma, PA-C  Multiple Vitamin (MULTIVITAMIN WITH MINERALS) TABS tablet Take 1 tablet by mouth daily. 10/04/22   Adhikari, Amrit, MD  nicotine  (NICODERM CQ  - DOSED IN MG/24 HOURS) 21 mg/24hr patch Place 1 patch (21 mg total) onto the skin daily. Patient not taking: Reported on 01/31/2023 10/04/22   Jillian Buttery, MD  oxyCODONE  (ROXICODONE ) 5 MG immediate release tablet Take 1 tablet (5 mg total) by mouth every 4 (four) hours as needed for severe pain (pain score 7-10). 03/22/24   Beverley Leita LABOR, PA-C  pantoprazole  (PROTONIX ) 40 MG tablet Take 1 tablet (40 mg total) by mouth daily. 11/27/23   Raford Lenis, MD  senna-docusate (SENOKOT-S) 8.6-50 MG tablet Take 1 tablet by mouth 2 (two) times daily. Patient not taking: Reported on 01/31/2023 10/03/22   Jillian Buttery, MD  thiamine  (VITAMIN B-1) 100 MG tablet Take 1 tablet (100 mg total) by mouth daily. Patient not taking: Reported on 01/31/2023 10/04/22   Jillian Buttery, MD  valsartan  (DIOVAN ) 40 MG tablet Take 1 tablet (40 mg total) by mouth daily. 12/04/23   Lorren Greig PARAS, NP  VITAMIN D  PO Take 1 capsule by mouth daily.    [provider]    Physical Exam: BP (!) 169/84   Pulse 98   Temp 98 F (36.7 C)   Resp (!) 21   Ht 5' 10 (1.778 m)   Wt 70.3 kg   SpO2 97%   BMI 22.24 kg/m  General: Pleasant, weak appearing elderly man laying in bed. No acute distress. HEENT: [place]/AT. Anicteric sclera CV: RRR. No murmurs, rubs, or gallops. No LE edema Pulmonary: Lungs CTAB. Normal effort. No wheezing or rales. Abdominal: Soft, nontender, nondistended. Normal bowel sounds. MSK: Moderate  tenderness to palpation of the low back Skin: Warm and dry. No obvious rash or lesions. Neuro: A&Ox3. Moves all extremities. Normal sensation to light touch. No focal deficit. Psych: Normal mood and affect          Labs on Admission:  Basic Metabolic Panel: Recent Labs  Lab 04/01/24 0345 04/07/24 1841  NA 133* 131*  K 3.6 3.9  CL 99 96*  CO2 21* 21*  GLUCOSE 88 92  BUN 12 7*  CREATININE 0.75 0.80  CALCIUM  9.7 10.0  MG 1.9  --    Liver Function Tests: Recent Labs  Lab 04/01/24 0345 04/07/24 1841  AST 24 41  ALT 14 18  ALKPHOS 260* 257*  BILITOT <0.2 0.5  PROT 7.1 6.7  ALBUMIN 4.5 4.2  No results for input(s): LIPASE, AMYLASE in the last 168 hours. No results for input(s): AMMONIA in the last 168 hours. CBC: Recent Labs  Lab 04/01/24 0345 04/07/24 1841  WBC 6.6 9.5  NEUTROABS 3.0 6.6  HGB 12.4* 11.5*  HCT 35.3* 35.2*  MCV 113.5* 114.3*  PLT 222 185   Cardiac Enzymes: Recent Labs  Lab 04/01/24 0345  CKTOTAL 108   BNP (last 3 results) No results for input(s): BNP in the last 8760 hours.  ProBNP (last 3 results) No results for input(s): PROBNP in the last 8760 hours.  CBG: No results for input(s): GLUCAP in the last 168 hours.  Radiological Exams on Admission: No results found. Assessment/Plan David Townsend is a 63 y.o. 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 on ADT, anemia of chronic disease, GERD, HTN, HLD, cancer related pain, tobacco use disorder and alcohol use disorder who presents to the ED for evaluation of worsening back pain and admitted for cancer related pain  # Cancer-related pain # Prostate cancer with spinal metastases - MRI L-spine on 10/24 showed progressive osseous metastases throughout the lumbar and visualized lower thoracic spine, progressive moderate foraminal narrowing at the L3-L4, L4-L5 and L5-S1, worse on the right - Patient presented with a few days of worsening low back  pain not improved with home oxycodone  - Will admit for pain control - Multimodal pain control with scheduled Tylenol , lidocaine  patch, as needed oxycodone , Robaxin and IV Dilaudid  - Palliative care consulted to assist with pain management and possible outpatient follow-up - Continue apalutamide  - Follow-up with urology in the outpatient  # Hypertension - BP elevated with SBP in the 150s to 170s likely secondary worsening pain - Resume amlodipine  - IV hydralazine as needed for SBP > 180  # EtOH use disorder - Reports drinking 3-4 mixed drinks and 3-4 beers per day - Recently seen in the ED for EtOH intoxication, reports drinking only 1 beer today - Start CIWA with Ativan  - Follow-up ethanol levels, mag and Phos - TOC consulted for substance abuse resources  # Hyponatremia, mild - Sodium 131 on admission likely due to dehydration in the setting of EtOH use and poor appetite - Continue IV NS at 100 cc/h for 10 hours - Follow-up morning sodium  # HLD - Continue atorvastatin   # Tobacco use disorder - Reports smoking 1 pack/day of cigarettes - Nicotine  patch offered, patient declined  # Generalized weakness - This is in the setting of acute back pain - PT/OT eval and treat   DVT prophylaxis: Lovenox      Code Status: Full Code  Consults called: Palliative care  Family Communication: None  Severity of Illness: The appropriate patient status for this patient is OBSERVATION. Observation status is judged to be reasonable and necessary in order to provide the required intensity of service to ensure the patient's safety. The patient's presenting symptoms, physical exam findings, and initial radiographic and laboratory data in the context of their medical condition is felt to place them at decreased risk for further clinical deterioration. Furthermore, it is anticipated that the patient will be medically stable for discharge from the hospital within 2 midnights of admission.   Level  of care: Telemetry    David Claretta HERO, MD 04/08/2024, 12:38 AM [facility] Pager: [phone] Isaiah 41:10   If 7PM-7AM, please contact night-coverage [email or web address] Password [email or web address]

## 2024-04-08 NOTE — Consult Note (Signed)
 Consultation Note Date: 04/08/2024   Patient Name: David Townsend  DOB: [birth year]  MRN: [id number]  Age / Sex: 63 y.o., male   PCP: Lorren Greig PARAS, NP Referring Physician: Austria, Eric J, DO  Reason for Consultation: Pain control     Chief Complaint/History of Present Illness:   Patient is a 64 year old male with a past medical history of metastatic prostate cancer to the bone, anemia of chronic medical disease, hypertension, hyperlipidemia, GERD, tobacco use disorder, and alcohol use disorder who presented to [name] ED on 04/07/2024 with progressive lower back pain and weakness.  Patient had reported few weeks prior to presentation this pain which is now limiting his ability to perform ADLs as well as poor appetite.  Patient does endorse continuing to drink 3-4 mixed drinks and 3-4 beers per day along with smoking 1 pack/day of cigarettes.  Since admission, patient has received management for cancer related pain, electrolyte abnormalities, and weakness/deconditioning.  Patient was noted to have lumbar spine MRI performed on 03/27/2024 which noted progressive osseous metastases throughout the lumbar and visualized lower thoracic spine; did not note extraosseous extension or pathologic fracture.  Patient is followed by urologist, Dr. Dewey, at [facility] urology for management of prostate cancer.  Patient has been receiving Erleada  for management of prostate cancer though concerned about regimen with recent progression noted on imaging.  Palliative medicine team consulted to assist with pain management.  Extensive review of EMR including recent documentation from outpatient urology, hospitalist, and ED physician.  Personally reviewed MRI obtained on 03/27/2024 noting progression of osseous metastases in the lumbar spine.  Did not see any recent evaluation by radiation oncology.  Reviewed recent CMP noting hyponatremia at 134 and hypomagnesemia at 1.6.  Also reviewed recent CBC noting hemoglobin  low at 11.3.  At time of EMR review in past 24 hours patient has received as needed IV Dilaudid  1 mg x 3 doses.  Patient also received as needed methocarbamol 500 mg x 1 dose.  Patient receiving Tylenol  1000 mg 3 times daily. Patient was noted to be taking as needed oxycodone  10 mg every 6 hours at home. Discussed care with hospitalist to coordinate care.  Hospitalist going to reach out to urologist and radiation oncologist regarding evaluation and recommendations.  Presented to see patient in the ER.  Patient laying in bed, appeared uncomfortable.  Introduced myself as a member of the palliative medicine team and my role in patient's medical journey.  Spent time learning about patient's medical care up and to this point.  Patient noted worsening pain over the past few weeks to the point it is making it difficult for him to even walk.  Patient noted while he has gone to the ER multiple times recently, has not recently discussed care with neurologist.  Inquired if patient has ever received radiation for management to assist with his pain and he denied that he has.  Noted hospitalist is reaching out to urology team and radiation oncologist for evaluation and recommendations. When inquiring about patient's pain, it is located primarily in his back.  It is worse with movement.  Patient feels that the as needed oxycodone  does not help with pain at all.  Patient has previously received morphine  and felt that was very beneficial to his pain management.  Noted would opioid rotate patient to morphine  and discontinue oxycodone .  Patient agreeing with this plan. Inquired about patient's last bowel movement which he believes was on 04/06/2024.  Discussed importance of regular bowel movements while receiving  opioids.  Noted would start on bowel regimen. Discussed would place referral to outpatient palliative medicine team for follow-up at [facility] to continue symptom management when patient is appropriate for discharge from  the hospital.  Discussed patient can cancel follow-up with chronic pain medication provider since his pain is cancer related and palliative medicine team would assist with management of that.  With permission, also inquired if patient has ACP documentation.  Patient noted that he does not.  Inquired if patient could not make medical decisions for himself, who would he want to make medical decisions on his behalf.  Patient was unsure of this because he noted that he has no family, only friends.  Discussed importance of consideration of this and completing ACP documents naming his healthcare power of attorney.  Also discussed that documents would review interventions such as life support and what medical interventions patient would want if he was seriously ill.  Did spend time explaining full code versus DNR/DNI in the setting of his metastatic prostate cancer.  Patient will consider this further.  All questions answered at that time.  Noted palliative medicine team will continue to follow along with patient's medical journey.  Primary Diagnoses  Present on Admission:  Cancer-related pain   Palliative Review of Systems: Back pain  Past Medical History:  Diagnosis Date   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   Essential hypertension 04/23/2022   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   Hyperlipidemia 03/27/2022   Penile lesion    Prostate cancer metastatic to bone (HCC) 04/23/2022   Urinary retention 04/23/2022   Social History   Socioeconomic History   Marital status: Single    Spouse name: n/a   Number of children: 0   Years of education: 12+   Highest education level: Not on file  Occupational History   Occupation: golf guy at [employer]  Tobacco Use   Smoking status: Every Day    Current packs/day: 2.00    Average packs/day: 2.0 packs/day for 34.0 years (68.0 ttl pk-yrs)    Types: Cigarettes    Passive exposure: Current   Smokeless tobacco: Never  Vaping Use    Vaping status: Never Used  Substance and Sexual Activity   Alcohol use: Yes    Alcohol/week: 7.0 standard drinks of alcohol    Types: 4 Cans of beer, 3 Shots of liquor per week    Comment: 5-6 beers per day   Drug use: No   Sexual activity: Not on file  Other Topics Concern   Not on file  Social History Narrative   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   Lives alone.   Golf Pro   Social Drivers of Health   Financial Resource Strain: Not on file  Food Insecurity: No Food Insecurity (09/21/2022)   Hunger Vital Sign    Worried About Running Out of Food in the Last Year: Never true    Ran Out of Food in the Last Year: Never true  Transportation Needs: No Transportation Needs (09/21/2022)   PRAPARE - Administrator, Civil Service (Medical): No    Lack of Transportation (Non-Medical): No  Physical Activity: Not on file  Stress: Not on file  Social Connections: Not on file   Family History  Family history unknown: Yes   Scheduled Meds:  acetaminophen   1,000 mg Oral TID   amLODipine   10 mg Oral Daily   apalutamide   120 mg Oral Daily   atorvastatin   20 mg Oral Daily   enoxaparin  (LOVENOX ) injection  40 mg Subcutaneous Q24H   folic acid   1 mg Oral Daily   lidocaine   1 patch Transdermal Q24H   multivitamin with minerals  1 tablet Oral Daily   senna-docusate  1 tablet Oral QHS   thiamine   100 mg Oral Daily   Or   thiamine   100 mg Intravenous Daily   Continuous Infusions:  sodium chloride  100 mL/hr at 04/07/24 2337   PRN Meds:.bisacodyl, hydrALAZINE, HYDROmorphone  (DILAUDID ) injection, LORazepam  **OR** LORazepam , methocarbamol, ondansetron  **OR** ondansetron  (ZOFRAN ) IV, oxyCODONE  No Known Allergies CBC:    Component Value Date/Time   WBC 8.6 04/08/2024 0557   HGB 11.3 (L) 04/08/2024 0557   HGB 13.1 01/04/2023 1049   HCT 33.2 (L) 04/08/2024 0557   HCT 36.2 (L) 01/04/2023 1049   PLT 171 04/08/2024 0557   PLT 167 01/04/2023 1049   MCV 114.9 (H)  04/08/2024 0557   MCV 109 (H) 01/04/2023 1049   NEUTROABS 6.6 04/07/2024 1841   LYMPHSABS 1.8 04/07/2024 1841   MONOABS 0.7 04/07/2024 1841   EOSABS 0.2 04/07/2024 1841   BASOSABS 0.1 04/07/2024 1841   Comprehensive Metabolic Panel:    Component Value Date/Time   NA 134 (L) 04/08/2024 0557   NA 139 01/04/2023 1049   K 4.0 04/08/2024 0557   CL 101 04/08/2024 0557   CO2 23 04/08/2024 0557   BUN 7 (L) 04/08/2024 0557   BUN 13 01/04/2023 1049   CREATININE 0.71 04/08/2024 0557   CREATININE 0.80 04/23/2022 0959   CREATININE 1.24 10/29/2015 1339   GLUCOSE 98 04/08/2024 0557   CALCIUM  9.4 04/08/2024 0557   AST 41 04/07/2024 1841   AST 21 04/23/2022 0959   ALT 18 04/07/2024 1841   ALT 13 04/23/2022 0959   ALKPHOS 257 (H) 04/07/2024 1841   BILITOT 0.5 04/07/2024 1841   BILITOT 0.8 01/04/2023 1049   BILITOT 0.4 04/23/2022 0959   PROT 6.7 04/07/2024 1841   PROT 6.5 01/04/2023 1049   ALBUMIN 4.2 04/07/2024 1841   ALBUMIN 4.3 01/04/2023 1049    Physical Exam: Vital Signs: BP (!) 181/96   Pulse 81   Temp 98 F (36.7 C)   Resp 16   Ht 5' 10 (1.778 m)   Wt 70.3 kg   SpO2 95%   BMI 22.24 kg/m  SpO2: SpO2: 95 % O2 Device: O2 Device: Nasal Cannula O2 Flow Rate:   Intake/output summary:  Intake/Output Summary (Last 24 hours) at 04/08/2024 9285 Last data filed at 04/07/2024 2349 Gross per 24 hour  Intake --  Output 550 ml  Net -550 ml   LBM:   Baseline Weight: Weight: 70.3 kg Most recent weight: Weight: 70.3 kg  General: Grimacing at times, alert, chronically ill-appearing Cardiovascular: RRR Respiratory: no increased work of breathing noted, not in respiratory distress Abdomen: not distended Neuro: A&Ox4, following commands easily Psych: appropriately answers all questions          Palliative Performance Scale: 50%              Additional Data Reviewed: Recent Labs    04/07/24 1841 04/08/24 0557  WBC 9.5 8.6  HGB 11.5* 11.3*  PLT 185 171  NA 131* 134*  BUN  7* 7*  CREATININE 0.80 0.71    Imaging: MR Lumbar Spine W Wo Contrast EXAM: MR Lumbar Spine with and without intravenous contrast. 03/27/2024 04:42:52 PM  TECHNIQUE: Multiplanar multisequence MRI of the lumbar spine was performed with and without the  administration of intravenous contrast.  COMPARISON: MRI of the lumbar spine 17/24.  CLINICAL HISTORY: Metastatic disease evaluation. Chronic back pain. History of prostate cancer. Recent PET scan shown metastases. Seen at [facility] and given oxy but no relief.  FINDINGS:  BONES AND ALIGNMENT: Progressive metastases are present throughout the lumbar and visualized lower thoracic spine. Diffuse enhancement is present throughout the L2 vertebral body. A 15 mm lesion is present along the posterior inferior L2 vertebral body. A lesion anteriorly on the left at L4 measures up to 21 mm. Enhancing lesion extends through the left pedicle of L2 and into the posterior elements. A left lateral lesion at T12 extends into the left pedicle measuring 3.4 cm. No extraosseous extension of metastases is present. No pathologic fractures are present. Normal alignment.  SPINAL CORD: The conus terminates normally.  SOFT TISSUES: No acute abnormality.  T12-L1: A left lateral lesion at T12 extends into the left pedicle measuring 3.4 cm. No disc herniation. No spinal canal stenosis or neural foraminal narrowing.  L1-L2: No disc herniation. No spinal canal stenosis or neural foraminal narrowing.  L2-L3: Diffuse enhancement is present throughout the L2 vertebral body. A 15 mm lesion is present along the posterior inferior L2 vertebral body. Enhancing lesion extends through the left pedicle of L2 and into the posterior elements. No disc herniation. No spinal canal stenosis or neural foraminal narrowing.  L3-L4: A broad-based disc protrusion is present. Moderate foraminal narrowing has progressed bilaterally, right greater than left.  L4-L5: A  lesion anteriorly on the left at L4 measures up to 21 mm. A broad-based disc protrusion and moderate facet hypertrophy is present. Progressive moderate subarticular and foraminal stenosis is worse right than left.  L5-S1: A rightward disc protrusion and moderate facet hypertrophy results in progressive moderate right and mild left foraminal stenosis similar to the prior study.  IMPRESSION: 1. Progressive osseous metastases throughout the lumbar and visualized lower thoracic spine without extraosseous extension or pathologic fracture. 2. Progressive moderate foraminal narrowing at L3-4, right greater than left. 3. Progressive moderate subarticular and foraminal stenosis at L4-5, worse on the right. 4. Progressive moderate right and mild left foraminal stenosis at L5-S1, similar to prior study.  Electronically signed by: Lonni Necessary MD 03/27/2024 05:06 PM EDT RP Workstation: [id number]    I personally reviewed recent imaging.   Palliative Care Assessment and Plan Summary of Established Goals of Care and Medical Treatment Preferences   Patient is a 63 year old male with a past medical history of metastatic prostate cancer to the bone, anemia of chronic medical disease, hypertension, hyperlipidemia, GERD, tobacco use disorder, and alcohol use disorder who presented to [name] ED on 04/07/2024 with progressive lower back pain and weakness.  Patient had reported few weeks prior to presentation this pain which is now limiting his ability to perform ADLs as well as poor appetite.  Patient does endorse continuing to drink 3-4 mixed drinks and 3-4 beers per day along with smoking 1 pack/day of cigarettes.  Since admission, patient has received management for cancer related pain, electrolyte abnormalities, and weakness/deconditioning.  Patient was noted to have lumbar spine MRI performed on 03/27/2024 which noted progressive osseous metastases throughout the lumbar and visualized lower  thoracic spine; did not note extraosseous extension or pathologic fracture.  Patient is followed by urologist, Dr. Dewey, at [facility] for management of prostate cancer.  Patient has been receiving Erleada  for management of prostate cancer though concerned about regimen with recent progression noted on imaging.  Palliative medicine team  consulted to assist with pain management.  # Complex medical decision making/goals of care  - Discussed care with patient as detailed above in HPI.  Patient noted to have progression of metastatic prostate cancer in lumbar and thoracic spine on MRI performed 03/27/2024.  Hospitalist reaching out to urologist for further recommendations regarding cancer directed therapy; concerned may need to be adjusted since patient has had progression.  Also discussed reaching out to radiation oncology for evaluation.   Also introduced importance of ACP documentation to name HCPOA and wishes regarding medical interventions.  Introduced CODE STATUS discussion by explaining full code versus DNR/DNI in setting of patient's underlying diagnosis of metastatic prostate cancer.  Patient considering this further.  Have consulted chaplain to assist with completion of ACP documentation.  Palliative medicine team continuing to follow along with patient's medical journey.  -  Code Status: Full Code   # Symptom management Patient is receiving these palliative interventions for symptom management with an intent to improve quality of life.   - Pain, acute on chronic in setting of metastatic prostate cancer to bones.   - Change Tylenol  to 1000 mg every 8 hours scheduled during the day   - Stop as needed oxycodone    - Start MS IR 15 mg every 4 hour as needed   - Change IV Dilaudid  to 1 mg every 4 hours as needed breakthrough pain after oral morphine    -Radiation oncology consulted for evaluation   - Constipation   - Agree with bisacodyl suppository daily as needed   - Start senna 1 tab  twice daily  # Psycho-social/Spiritual Support:  - Patient notes that he does not have any family, has some friends in the area.  Discussed importance of completing ACP documentation naming HCPOA if patient was unable to express his wishes for medical care.  # Discharge Planning:  To Be Determined  Thank you for allowing the palliative care team to participate in the care [name].  Tinnie Radar, DO Palliative Care Provider PMT # [phone]  If patient remains symptomatic despite maximum doses, please call PMT at [phone] between 0700 and 1900. Outside of these hours, please call attending, as PMT does not have night coverage.  Billing based on MDM: High  Problems Addressed: One or more chronic illnesses with severe exacerbation, progression, or side effects of treatment.  Amount and/or Complexity of Data: Category 2:Independent interpretation of a test performed by another physician/other qualified health care professional (not separately reported) and Category 3:Discussion of management or test interpretation with external physician/other qualified health care professional/appropriate source (not separately reported)  Risks: Parenteral controlled substances

## 2024-04-08 NOTE — Plan of Care (Addendum)
 Patient alert and oriented X4, all admission questions answered. Significant other at the bedside assisting pt. PT bed alarm turned on for unsteady gait and is to remain on for safety protocol. Chronic foley in place and emptied. PRN Morphine  administered for pt's 8/10 pain and white board updated. Bed at lowest position, PIV is clean, dry, intact and flushed. Pt resting comfortable in bed. No further needs atm.  Problem: Education: Goal: Knowledge of General Education information will improve Description: Including pain rating scale, medication(s)/side effects and non-pharmacologic comfort measures Outcome: Progressing   Problem: Health Behavior/Discharge Planning: Goal: Ability to manage health-related needs will improve Outcome: Progressing   Problem: Clinical Measurements: Goal: Respiratory complications will improve Outcome: Progressing   Problem: Activity: Goal: Risk for activity intolerance will decrease Outcome: Progressing   Problem: Nutrition: Goal: Adequate nutrition will be maintained Outcome: Progressing   Problem: Elimination: Goal: Will not experience complications related to bowel motility Outcome: Progressing Goal: Will not experience complications related to urinary retention Outcome: Progressing

## 2024-04-08 NOTE — ED Notes (Signed)
 Patient transported to MRI

## 2024-04-08 NOTE — ED Notes (Signed)
 The pharmacy has been notified to send the Avapro .

## 2024-04-09 ENCOUNTER — Ambulatory Visit: Admitting: Radiation Oncology

## 2024-04-09 ENCOUNTER — Ambulatory Visit: Attending: Radiation Oncology | Admitting: Radiation Oncology

## 2024-04-09 ENCOUNTER — Other Ambulatory Visit: Payer: Self-pay | Admitting: Radiation Oncology

## 2024-04-09 ENCOUNTER — Ambulatory Visit: Payer: Self-pay

## 2024-04-09 DIAGNOSIS — Z79899 Other long term (current) drug therapy: Secondary | ICD-10-CM

## 2024-04-09 DIAGNOSIS — Z515 Encounter for palliative care: Secondary | ICD-10-CM

## 2024-04-09 DIAGNOSIS — C7951 Secondary malignant neoplasm of bone: Principal | ICD-10-CM

## 2024-04-09 DIAGNOSIS — Z7189 Other specified counseling: Secondary | ICD-10-CM

## 2024-04-09 DIAGNOSIS — G893 Neoplasm related pain (acute) (chronic): Secondary | ICD-10-CM | POA: Diagnosis not present

## 2024-04-09 LAB — TESTOSTERONE: Testosterone: 10 ng/dL — ABNORMAL LOW (ref 264–916)

## 2024-04-09 LAB — BASIC METABOLIC PANEL WITH GFR
Anion gap: 11 (ref 5–15)
BUN: 6 mg/dL — ABNORMAL LOW (ref 8–23)
CO2: 25 mmol/L (ref 22–32)
Calcium: 10.1 mg/dL (ref 8.9–10.3)
Chloride: 99 mmol/L (ref 98–111)
Creatinine, Ser: 0.68 mg/dL (ref 0.61–1.24)
GFR, Estimated: >60 mL/min (ref >60)
Glucose, Bld: 98 mg/dL (ref 70–99)
Potassium: 4.5 mmol/L (ref 3.5–5.1)
Sodium: 135 mmol/L (ref 135–145)

## 2024-04-09 LAB — SEX HORMONE BINDING GLOBULIN: Sex Hormone Binding: 114 nmol/L — ABNORMAL HIGH (ref 19.3–76.4)

## 2024-04-09 LAB — MAGNESIUM: Magnesium: 1.9 mg/dL (ref 1.7–2.4)

## 2024-04-09 MED ORDER — HYDROMORPHONE HCL 1 MG/ML IJ SOLN
1.0000 mg | Freq: Once | INTRAMUSCULAR | Status: AC
  Administered 2024-04-09: 1 mg via INTRAVENOUS

## 2024-04-09 MED ORDER — HYDROMORPHONE HCL 1 MG/ML IJ SOLN
INTRAMUSCULAR | Status: AC
  Filled 2024-04-09: qty 1

## 2024-04-09 MED ORDER — CHLORHEXIDINE GLUCONATE CLOTH 2 % EX PADS: 6.0000 | MEDICATED_PAD | Freq: Every day | CUTANEOUS | Status: DC

## 2024-04-09 MED ORDER — MORPHINE SULFATE 15 MG PO TABS
30.0000 mg | ORAL_TABLET | ORAL | Status: DC | PRN
  Administered 2024-04-09 – 2024-04-10 (×6): 30 mg via ORAL
  Filled 2024-04-09 (×6): qty 2

## 2024-04-09 MED ORDER — MORPHINE SULFATE ER 15 MG PO TBCR
30.0000 mg | EXTENDED_RELEASE_TABLET | Freq: Three times a day (TID) | ORAL | Status: DC
  Administered 2024-04-09 – 2024-04-10 (×2): 30 mg via ORAL
  Filled 2024-04-09 (×2): qty 2

## 2024-04-09 MED ORDER — DEXAMETHASONE 4 MG PO TABS
4.0000 mg | ORAL_TABLET | Freq: Two times a day (BID) | ORAL | Status: DC
  Administered 2024-04-09 – 2024-04-10 (×3): 4 mg via ORAL
  Filled 2024-04-09 (×3): qty 1

## 2024-04-09 MED ORDER — POLYETHYLENE GLYCOL 3350 17 G PO PACK
17.0000 g | PACK | Freq: Two times a day (BID) | ORAL | Status: DC
  Administered 2024-04-09 – 2024-04-10 (×2): 17 g via ORAL
  Filled 2024-04-09 (×2): qty 1

## 2024-04-09 MED ORDER — BISACODYL 10 MG RE SUPP: 10.0000 mg | Freq: Every day | RECTAL | Status: DC | PRN

## 2024-04-09 MED ORDER — DEXAMETHASONE 4 MG PO TABS: 4.0000 mg | ORAL_TABLET | Freq: Two times a day (BID) | ORAL | 1 refills | Status: DC

## 2024-04-09 MED ORDER — SENNOSIDES-DOCUSATE SODIUM 8.6-50 MG PO TABS
2.0000 | ORAL_TABLET | Freq: Two times a day (BID) | ORAL | Status: DC
  Administered 2024-04-09 – 2024-04-10 (×2): 2 via ORAL
  Filled 2024-04-09 (×2): qty 2

## 2024-04-09 NOTE — Progress Notes (Signed)
 Subjective/Chief Complaint:  1 - Metastatic Prostate Cancer -  Initial DX - Grade 5 disease 2023 on eval PSA 403  Most Recent Staging - 11/2023 CT - som progresion of diffuse bone mets (skull, ribs, spine, pelvis, humerus, femurs)  Primary Therapy - none  Current Thearpy - androgen deprivation + reduced dose Erleada  (120, through [facility])  Prior Therapy   Recent Course:  11/2022 - PSA 0.1, T 21 ==> Eligard 45, stay Erleada   06/2023 - PSA 2.53, T 13, CMP OK ==> Eligard 45, stay Erleada .; 12/2023 PSA 12.3, T 20, CMP OK ==> Eligard 45, stay Erleada   04/2024 PSA 244, CT progressive bone mets + Pathologic T spine fracture.   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 UDS 2021 with no bladder contractions. Manages with chornic foley changed Q monthly. Did not tolerate self cath. Still golfs and has high QOL.   PMH sig for HTN. NO non-Gu surgeries, NO CV disesae / blood thinners. He is semi-retired golf pro. Between PCP's.   Today  David Townsend  is seen stable. BIggest issued remains back pain, T spine site specific radiation + steroids started today and pain meds adjusted by palliative team, he is already doing better.       Objective: Vital signs in last 24 hours: Temp:  [98.2 F (36.8 C)-98.9 F (37.2 C)] 98.8 F (37.1 C) (11/06 1316) Pulse Rate:  [80-108] 108 (11/06 1316) Resp:  [16-20] 16 (11/06 1316) BP: (130-161)/(73-100) 132/73 (11/06 1316) SpO2:  [86 %-93 %] 86 % (11/06 1316) Weight:  [68 kg] 68 kg (11/05 1607) Last BM Date :  (PTA)  Intake/Output from previous day: 11/05 0701 - 11/06 0700 In: 39.1 [IV Piggyback:39.1] Out: 2500 [Urine:2500] Intake/Output this shift: Total I/O In: 360 [P.O.:360] Out: 200 [Urine:200]  NAD, very pleasant  NLB-RA SNTND Catheter in place No c/c/e  Lab Results:  Recent Labs    04/07/24 1841 04/08/24 0557  WBC 9.5 8.6  HGB 11.5* 11.3*  HCT 35.2* 33.2*  PLT 185 171   BMET Recent Labs     04/08/24 0557 04/09/24 0622  NA 134* 135  K 4.0 4.5  CL 101 99  CO2 23 25  GLUCOSE 98 98  BUN 7* 6*  CREATININE 0.71 0.68  CALCIUM  9.4 10.1   PT/INR No results for input(s): LABPROT, INR in the last 72 hours. ABG No results for input(s): PHART, HCO3 in the last 72 hours.  Invalid input(s): PCO2, PO2  Studies/Results: MR THORACIC SPINE W WO CONTRAST Result Date: 04/08/2024 EXAM: MRI THORACIC SPINE WITH AND WITHOUT INTRAVENOUS CONTRAST 04/08/2024 03:43:35 PM TECHNIQUE: Multiplanar multisequence MRI of the thoracic spine was performed with and without the administration of intravenous contrast. COMPARISON: MRI thoracic spine 09/19/2022. MRI lumbar spine 03/27/2024. CTA chest, abdomen, and pelvis 11/27/2023. CLINICAL HISTORY: Metastatic disease evaluation. Severe back pain. History of metastatic prostate cancer. FINDINGS: BONES AND ALIGNMENT: There is widespread metastatic disease involving every level in the thoracic spine which has greatly progressed from the prior thoracic spine MRI. There is complete or near complete marrow replacement in the T2 through T8 vertebral bodies with large lesions noted at T11 and T12 as well. Posterior element involvement is particularly extensive from T4 to T6. Multiple compression fractures are present. A T4 compression fracture is new from the prior thoracic MRI but was present on the interval chest CT with mildly progressive vertebral body height loss, now measuring 25%. A mild T3 compression fracture is  new or progressive, with 10% vertebral body height loss. There are new T5 and T6 compression fractures with 20% height loss each. A T8 compression fracture is new or progressive with 30% height loss. Assessment for marrow edema associated with these fractures is limited due to the underlying widespread marrow signal abnormality; however, there is a visible unhealed fracture line within the T8 vertebral body extending to the posterior cortex. T8  retropulsion measures 2 to 3 mm. Mildly prominent epidural enhancement, most prominent ventrally from T5 to T8, is favored to reflect prominent epidural venous plexus without masslike enhancement seen to clearly indicate epidural tumor, although minimal epidural tumor in this region is not excluded. SPINAL CORD: There is apparent mild hyperintensity throughout the majority of the thoracic spinal cord on the sagittal STIR sequence which is not clearly confirmed on the standard sagittal T2 sequence. Assessment of the spinal cord on axial images is limited due to severe motion artifact. SOFT TISSUES: Dorsal epidural lipomatosis has increased from the prior thoracic spine MRI and results in spinal stenosis with essentially complete CSF effacement from T3 to T8. There is mild mass effect on the spinal cord in the region from T3 to T8. IMPRESSION: 1. Progressive, widespread metastatic disease throughout the thoracic spine. 2. Multiple new or progressive pathologic compression fractures, most notably at T8 where there is an unhealed fracture with 30% height loss and mild retropulsion. 3. 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 Mild spinal cord edema is not excluded with assessment limited by motion. Electronically signed by: Dasie Hamburg MD 04/08/2024 04:17 PM EST RP Workstation: [id number]    Anti-infectives: Anti-infectives (From admission, onward)    None       Assessment/Plan:  PSA Doubling time of <62mos ominous sign. Agree with palliatve intent T spine radiation to help with acute pain. Still rec consider path to radio-pharmaceuticals (xofigo or pluvicto) once acute issue stabilized and consider PO med change to Abi/Pred (pred may help some with bone pain) in outpatient setting.   Greatly appreciate hospitalist, neurosurg, palliative, rad-onc teams comanagement.   Please call me directly with questions  anytime  Ricardo KATHEE Alvaro Mickey. 04/09/2024

## 2024-04-09 NOTE — Plan of Care (Signed)
  Problem: Activity: Goal: Risk for activity intolerance will decrease Outcome: Progressing   Problem: Elimination: Goal: Will not experience complications related to urinary retention Outcome: Progressing   Problem: Safety: Goal: Ability to remain free from injury will improve Outcome: Progressing   

## 2024-04-09 NOTE — Plan of Care (Signed)
   Problem: Education: Goal: Knowledge of General Education information will improve Description Including pain rating scale, medication(s)/side effects and non-pharmacologic comfort measures Outcome: Progressing   Problem: Health Behavior/Discharge Planning: Goal: Ability to manage health-related needs will improve Outcome: Progressing

## 2024-04-09 NOTE — Progress Notes (Signed)
 Patient ID: David [facility]send, male   DOB: [birth year], 63 y.o.   MRN: [id number] Reviewed MRI thoracic and lumbar spines.  Given multiple acute thoracic spine fractures which are clearly pathologic I do not believe operative treatment would be beneficial with the significant tumor burden present in the spine. There is anterior and posterior disease. I do recommend an MRI of the cervical spine without and without contrast for a full assessment.

## 2024-04-09 NOTE — Plan of Care (Signed)
  Problem: Education: Goal: Knowledge of General Education information will improve Description: Including pain rating scale, medication(s)/side effects and non-pharmacologic comfort measures Outcome: Progressing   Problem: Health Behavior/Discharge Planning: Goal: Ability to manage health-related needs will improve Outcome: Progressing   Problem: Clinical Measurements: Goal: Ability to maintain clinical measurements within normal limits will improve Outcome: Progressing   Problem: Coping: Goal: Level of anxiety will decrease Outcome: Progressing   Problem: Elimination: Goal: Will not experience complications related to bowel motility Outcome: Progressing Goal: Will not experience complications related to urinary retention Outcome: Progressing

## 2024-04-09 NOTE — Progress Notes (Signed)
 Pt seen in our department. I shared that I spoke with Dr. Gillie and he did not recommend surgery, but did recommend an MRI C spine to complete his work up. The patient declines at this time but will be open to this if he can get more pain relief from his radiation. He is also open to IR discussion of osteocool/ablation after radiation treatment, and we discussed that we would wire the sites for his therapy but anticipate treatment of the upper T spine over 10 fractions beginning tomorrow. We also discussed his weakness which is not reproducible on neurologic exam but we discussed the utility of trying dexamethasone  4 mg BID given this and since we will start treatment tomorrow to reduce risks of rebound inflammation. A new prescription was sent to his outpt pharmacy. Written consent is obtained and placed in the chart, a copy was provided to the patient. I asked our nursing team to give another dose of dilaudid  due to pain and need to lay flat on our rigid table for radiation planning.       [facility] Husband, PAC

## 2024-04-09 NOTE — Progress Notes (Signed)
 Daily Progress Note   Patient Name: David [facility]send       Date: 04/09/2024 DOB: Oct 27, 1960  Age: 63 y.o. MRN#: [id number] Attending Physician: Austria, Eric J, DO Primary Care Physician: Lorren Greig PARAS, NP Admit Date: 04/07/2024 Length of Stay: 1 day  Reason for Consultation/Follow-up: Pain control  Subjective:   CC: Patient notes pain currently worse because just lay down for radiation though overall improved from yesterday.  Following up regarding pain management.  Subjective:  Extensive review of EMR including recent documentation from hospitalist, radiation oncology, and neurosurgery.  At time of EMR review on past 24 hours patient has received as needed IV Dilaudid  1 mg x 5 doses and as needed morphine  15 mg x 5 doses.  Reviewed recent BMP noting estimated GFR over 60 to assist with medication management. Discussed care with bedside RN for medical updates.  Also discussed care with hospitalist to coordinate care.  Presented to bedside in the afternoon once patient had returned from radiation.  Able to follow-up regarding symptom management.  Patient noted he was having slightly worse pain at this time because he had just laid on the bed for radiation though he had gotten medication down there which had helped.  Discussed current pain regimen.  Patient has been taking as needed morphine  and Dilaudid .  Patient does feel the as needed morphine  helps though not quite enough to manage his pain well enough.  Acknowledged this and noted have increased his dose to MS IR 30 mg every 4 hours as needed. Discussed based on patient's OME requirements, can appropriately start long-acting medication.  Spent extensive time counseling patient on need to abstain from alcohol while taking opioids as do not want to cause sedative effects.  Spent time answering questions regarding this.  Patient agreed with importance of not causing harm and not using alcohol while using opioids.  Also discussed if chaplain had  provided ACP documentation which patient they had.  His friend, [facility]send, took the paperwork with her to review and would likely be HCPOA.  Noted he plans to get paperwork completed even if needed in the outpatient setting.  Discussed planning at this time.  Patient noted he is to receive another session of radiation tomorrow.  He has transportation to go back and forth to get radiation moving forward.  If pain can improve, may be able to go home and follow-up with radiation in the outpatient setting.  Patient would also follow-up with urology in the outpatient setting for continued discussions regarding appropriate cancer directed therapy moving forward.  Again have discussed with patient that his cancer is metastatic so the idea is to get control, not cure.  Patient acknowledged this.  All questions answered at that time.  Noted palliative medicine to continue following patient's medical journey.  Discussed care with hospitalist and bedside RN after visit to coordinate care.  Review of Systems Pain improving Objective:   Vital Signs:  BP 130/74 (BP Location: Right Arm)   Pulse 89   Temp 98.2 F (36.8 C)   Resp 20   Ht 5' 10 (1.778 m)   Wt 68 kg   SpO2 90%   BMI 21.52 kg/m   Physical Exam: General: NAD, alert, pleasant Cardiovascular: RRR Respiratory: no increased work of breathing noted, not in respiratory distress Abdomen: not distended Psych: appropriately answers all questions  Assessment & Plan:   Assessment: Patient is a 63 year old male with a past medical history of metastatic prostate cancer to the bone, anemia of  chronic medical disease, hypertension, hyperlipidemia, GERD, tobacco use disorder, and alcohol use disorder who presented to [facility] ED on 04/07/2024 with progressive lower back pain and weakness.  Patient had reported few weeks prior to presentation this pain which is now limiting his ability to perform ADLs as well as poor appetite.  Patient does  endorse continuing to drink 3-4 mixed drinks and 3-4 beers per day along with smoking 1 pack/day of cigarettes.  Since admission, patient has received management for cancer related pain, electrolyte abnormalities, and weakness/deconditioning.  Patient was noted to have lumbar spine MRI performed on 03/27/2024 which noted progressive osseous metastases throughout the lumbar and visualized lower thoracic spine; did not note extraosseous extension or pathologic fracture.  Patient is followed by urologist, Dr. Dewey, at [facility] for management of prostate cancer.  Palliative medicine team consulted to assist with pain management.   Recommendations/Plan: # Complex medical decision making/goals of care:  - Discussed care with patient as detailed above in HPI.  Continuing with appropriate medical therapies at this time.  Patient will continue to receive radiation for metastatic disease.  Patient will follow-up with urology in the outpatient setting to continue discussion about further cancer directed therapies.  Palliative medicine team will continue to follow along with patient's medical journey.  - Patient stated he received ACP documentation.  His friend, [facility] Townsend, took at home to review.  He noted that he would either get it notarized while here or in the outpatient setting.  Have discussed importance of patient having this paperwork since he does not have family to call in case of emergency.  -  Code Status: Full Code  # Symptom management: Patient is receiving these palliative interventions for symptom management with an intent to improve quality of life.   Patient is receiving these palliative interventions for symptom management with an intent to improve quality of life.                 - Pain, acute on chronic in setting of metastatic prostate cancer to bone Within the past 24 hours hours, patient has required as needed IV Dilaudid  5 mg and as needed oral morphine  75 mg for opioid  management. Based on OMEs calculated for this dose, which would be approximately 106.25 OMEs when reducing by 50% for incomplete cross tolerance, will appropriately start patient on the listed regimen below.                                - Continue Tylenol  to 1000 mg every 8 hours scheduled during the day                               - Increase MS IR to 30 mg every 4 hour as needed                               - Continue IV Dilaudid  to 1 mg every 4 hours as needed breakthrough pain after oral morphine    - Start MS Contin  30 mg every 8 hours during the day; first dose at 2200 today                               - Continuing radiation as per to radiation oncology                  -  Constipation                               - Continue with bisacodyl suppository daily as needed                               - Increase senna to 2 tabs twice daily  # Discharge Planning: Home with Palliative Services follow-up at [facility]  - Already placed referral for outpatient palliative medicine follow-up at [facility]  Discussed with: Patient, hospitalist, bedside RN  Thank you for allowing the palliative care team to participate in the care [facility].  Tinnie Radar, DO Palliative Care Provider PMT # [phone]  If patient remains symptomatic despite maximum doses, please call PMT at [phone] between 0700 and 1900. Outside of these hours, please call attending, as PMT does not have night coverage.

## 2024-04-09 NOTE — Progress Notes (Addendum)
 PROGRESS NOTE    David Townsend  [name] DOB: 1960/07/10 DOA: 04/07/2024 PCP: Jaycee Greig PARAS, NP    Brief Narrative:   David [facility]send is a 63 y.o. male with past medical history significant for prostate cancer with metastasis to bone, anemia of chronic medical disease, HTN, HLD, GERD, pain of malignancy, tobacco/alcohol use disorder who presented to [facility] ED on 04/07/2024 with progressive lower back pain, weakness.  Patient reports ongoing for a few weeks with now limitation with ability to perform ADLs.  Also reports poor appetite.  Recently had an increase in his oxycodone  from 5 to 10 mg, but still has had not significant relief with this.  Does endorse drinking 3-4 mixed drinks and 3-4 beers per day and smoking 1 pack/day of cigarettes.  Denies any recent trauma or falls.  No nausea/vomiting, fever/chills, no shortness of breath, no chest pain.  Patient has upcoming appointment with pain management clinic but given pain intolerable he sought further care in the ED.  In the ED, temperature 97.8 F, HR 93, RR 16, BP 167/94, SpO2 96% room air.  WBC 9.5, hemoglobin 0.5, platelet count 185.  Sodium 131, potassium 3.9, chloride 96, CO2 21, glucose 92, BUN 7, creatinine 0.80.  AST 41, ALT 18, total bilirubin 0.5.  EtOH level less than 15.  EKG with NSR.  Patient received 1 L NS bolus, IV Dilaudid  1 mg x 3.  TRH consulted for admission for further evaluation and management of intractable pain of malignancy.  Assessment & Plan:   Intractable pain of malignancy Prostate cancer metastasis to bone, progressive Patient presenting with progressive low back pain now with difficulty ambulating/performing ADLs.  Recently seen in the ED on 10/19, 10/24, 10/29 for same.  MR L-spine with without contrast 10/24 with progressive osseous metastasis throughout the lumbar and visualized lower thoracic spine without extraosseous extension or pathologic fracture, progressive moderate foraminal narrowing L3-4, right  greater than left; progressive moderate subarticular and foraminal stenosis at L4-5, worse on the right, progressive moderate right and mild left foraminal stenosis at L5-S1.  CT angiogram chest/abdomen/pelvis 11/27/2023 also notes sclerotic metastasis posterior medial right ilium, posterior left ilium. --Pelvic care following, appreciate assistance -- MS IR 15mg  PO q4h PRN severe pain -- Dilaudid  1 mg IV q4h PRN severe pain -- Robaxin 500 mg p.o. PO q6h PRN muscle spasms -- Lidocaine  patch -- Continue Erleada  120 mg PO daily -- Urology plans to proceed with Xofigo on an outpatient basis -- Radiation oncology plans to initiate XRT  Hyponatremia Sodium 131 on admission, likely secondary to dehydration in the setting of poor oral intake, EtOH use outpatient.  Received IV fluids. -- Na 868>865>864 -- BMP in am  Hypomagnesemia Repleted.  Magnesium  1.9 today.  HTN -- Amlodipine  10 mg p.o. daily -- Irbesartan  75 mg p.o. daily (substituted for home valsartan  40mg  PO daily) -- Hydralazine 10 mg IV every 8 hours as needed SBP >180  HLD -- Atorvastatin  20 mg p.o. daily  GERD Currently not taking PPI outpatient.  EtOH use disorder Endorses drinking 3-4 mixed drinks and 3-4 beers per day.  EtOH level less than 15.  Counseled on need for complete cessation/abstinence. -- CIWA protocol with symptom triggered Ativan   Tobacco use disorder Dors is smoking 1 pack/day of cigarettes.  Counseled on need for complete cessation.   -- Nicotine  patch   Weakness/ability/deconditioning: -- PT/OT currently recommending SNF. -- TOC consulted   DVT prophylaxis: enoxaparin  (LOVENOX ) injection 40 mg Start: 04/08/24 1000    Code  Status: Full Code Family Communication: No family present at bedside  Disposition Plan:  Level of care: Med-Surg Status is: Inpatient Remains inpatient appropriate because: Pain control, SNF placement, will start radiation treatments    Consultants:  Palliative  care Urology Radiation oncology  Procedures:  None  Antimicrobials:  None   Subjective: Patient seen examined bedside, ambulating around the room with the use of a walker.  Pain reported much better controlled today.  Seen by radiation oncology with plan to initiate XRT.  Also seen by urology with plans to initiate  Xofigo outpatient. Patient without any other complaints or concerns at this time.  Denies headache, no vision changes, no chest pain, no palpitations, no shortness of breath, no abdominal pain, no fever/chills/night sweats, no nausea/vomiting/diarrhea, no focal weakness, no fatigue, no paresthesias.  No acute events overnight per nursing staff.  Objective: Vitals:   04/08/24 1610 04/08/24 2047 04/09/24 0413 04/09/24 0643  BP: (!) 161/100 (!) 158/88 133/81 130/74  Pulse: (!) 107  92 89  Resp: 16 20 18 20   Temp: 98.2 F (36.8 C) 98.3 F (36.8 C) 98.9 F (37.2 C) 98.2 F (36.8 C)  TempSrc: Oral     SpO2: 93% (!) 87% 92% 90%  Weight:      Height:        Intake/Output Summary (Last 24 hours) at 04/09/2024 1030 Last data filed at 04/09/2024 0900 Gross per 24 hour  Intake 120 ml  Output 2200 ml  Net -2080 ml   Filed Weights   04/07/24 1836 04/08/24 1607  Weight: 70.3 kg 68 kg    Examination:  Physical Exam: GEN: NAD, alert and oriented x 3, chronically ill in appearance HEENT: NCAT, PERRL, EOMI, sclera clear, MMM PULM: CTAB w/o wheezes/crackles, normal respiratory effort, on room air CV: RRR w/o M/G/R GI: abd soft, NTND, + BS GU: Foley catheter noted MSK: no peripheral edema, moves all extremities independently; walking around the room with rolling walker this morning NEURO: CN II-XII intact, no focal deficits, sensation to light touch intact PSYCH: normal mood/affect Integumentary: No concerning rashes/lesions/wounds on exposed skin surfaces    Data Reviewed: I have personally reviewed following labs and imaging studies  CBC: Recent Labs  Lab  04/07/24 1841 04/08/24 0557  WBC 9.5 8.6  NEUTROABS 6.6  --   HGB 11.5* 11.3*  HCT 35.2* 33.2*  MCV 114.3* 114.9*  PLT 185 171   Basic Metabolic Panel: Recent Labs  Lab 04/07/24 1841 04/08/24 0557 04/09/24 0622  NA 131* 134* 135  K 3.9 4.0 4.5  CL 96* 101 99  CO2 21* 23 25  GLUCOSE 92 98 98  BUN 7* 7* 6*  CREATININE 0.80 0.71 0.68  CALCIUM  10.0 9.4 10.1  MG  --  1.6* 1.9  PHOS  --  3.2  --    GFR: Estimated Creatinine Clearance: 90.9 mL/min (by C-G formula based on SCr of 0.68 mg/dL). Liver Function Tests: Recent Labs  Lab 04/07/24 1841  AST 41  ALT 18  ALKPHOS 257*  BILITOT 0.5  PROT 6.7  ALBUMIN 4.2   No results for input(s): LIPASE, AMYLASE in the last 168 hours. No results for input(s): AMMONIA in the last 168 hours. Coagulation Profile: No results for input(s): INR, PROTIME in the last 168 hours. Cardiac Enzymes: No results for input(s): CKTOTAL, CKMB, CKMBINDEX, TROPONINI in the last 168 hours. BNP (last 3 results) No results for input(s): PROBNP in the last 8760 hours. HbA1C: No results for input(s): HGBA1C in the  last 72 hours. CBG: No results for input(s): GLUCAP in the last 168 hours. Lipid Profile: No results for input(s): CHOL, HDL, LDLCALC, TRIG, CHOLHDL, LDLDIRECT in the last 72 hours. Thyroid  Function Tests: No results for input(s): TSH, T4TOTAL, FREET4, T3FREE, THYROIDAB in the last 72 hours. Anemia Panel: No results for input(s): VITAMINB12, FOLATE, FERRITIN, TIBC, IRON , RETICCTPCT in the last 72 hours. Sepsis Labs: No results for input(s): PROCALCITON, LATICACIDVEN in the last 168 hours.  No results found for this or any previous visit (from the past 240 hours).       Radiology Studies: MR THORACIC SPINE W WO CONTRAST Result Date: 04/08/2024 EXAM: MRI THORACIC SPINE WITH AND WITHOUT INTRAVENOUS CONTRAST 04/08/2024 03:43:35 PM TECHNIQUE: Multiplanar multisequence MRI of  the thoracic spine was performed with and without the administration of intravenous contrast. COMPARISON: MRI thoracic spine 09/19/2022. MRI lumbar spine 03/27/2024. CTA chest, abdomen, and pelvis 11/27/2023. CLINICAL HISTORY: Metastatic disease evaluation. Severe back pain. History of metastatic prostate cancer. FINDINGS: BONES AND ALIGNMENT: There is widespread metastatic disease involving every level in the thoracic spine which has greatly progressed from the prior thoracic spine MRI. There is complete or near complete marrow replacement in the T2 through T8 vertebral bodies with large lesions noted at T11 and T12 as well. Posterior element involvement is particularly extensive from T4 to T6. Multiple compression fractures are present. A T4 compression fracture is new from the prior thoracic MRI but was present on the interval chest CT with mildly progressive vertebral body height loss, now measuring 25%. A mild T3 compression fracture is new or progressive, with 10% vertebral body height loss. There are new T5 and T6 compression fractures with 20% height loss each. A T8 compression fracture is new or progressive with 30% height loss. Assessment for marrow edema associated with these fractures is limited due to the underlying widespread marrow signal abnormality; however, there is a visible unhealed fracture line within the T8 vertebral body extending to the posterior cortex. T8 retropulsion measures 2 to 3 mm. Mildly prominent epidural enhancement, most prominent ventrally from T5 to T8, is favored to reflect prominent epidural venous plexus without masslike enhancement seen to clearly indicate epidural tumor, although minimal epidural tumor in this region is not excluded. SPINAL CORD: There is apparent mild hyperintensity throughout the majority of the thoracic spinal cord on the sagittal STIR sequence which is not clearly confirmed on the standard sagittal T2 sequence. Assessment of the spinal cord on axial  images is limited due to severe motion artifact. SOFT TISSUES: Dorsal epidural lipomatosis has increased from the prior thoracic spine MRI and results in spinal stenosis with essentially complete CSF effacement from T3 to T8. There is mild mass effect on the spinal cord in the region from T3 to T8. IMPRESSION: 1. Progressive, widespread metastatic disease throughout the thoracic spine. 2. Multiple new or progressive pathologic compression fractures, most notably at T8 where there is an unhealed fracture with 30% height loss and mild retropulsion. 3. 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 Mild spinal cord edema is not excluded with assessment limited by motion. Electronically signed by: Dasie Hamburg MD 04/08/2024 04:17 PM EST RP Workstation: [id number]        Scheduled Meds:  acetaminophen   1,000 mg Oral Q8H   amLODipine   10 mg Oral Daily   apalutamide   120 mg Oral Daily   atorvastatin   20 mg Oral Daily   enoxaparin  (LOVENOX ) injection  40 mg Subcutaneous Q24H   folic acid   1 mg Oral Daily   irbesartan   75 mg Oral Daily   lidocaine   1 patch Transdermal Q24H   multivitamin with minerals  1 tablet Oral Daily   nicotine   21 mg Transdermal Daily   senna-docusate  1 tablet Oral BID   thiamine   100 mg Oral Daily   Or   thiamine   100 mg Intravenous Daily   Continuous Infusions:   LOS: 1 day    Time spent: 45 minutes spent on 04/09/2024 caring for this patient face-to-face including chart review, ordering labs/tests, documenting, discussion with nursing staff, consultants, updating family and interview/physical exam    Camellia PARAS Shamir Sedlar, DO [facility] Available via Epic secure chat 7am-7pm After these hours, please refer to coverage provider listed on [email or web address] 04/09/2024, 10:30 AM

## 2024-04-10 ENCOUNTER — Other Ambulatory Visit: Payer: Self-pay

## 2024-04-10 ENCOUNTER — Other Ambulatory Visit (HOSPITAL_COMMUNITY): Payer: Self-pay

## 2024-04-10 ENCOUNTER — Inpatient Hospital Stay (HOSPITAL_COMMUNITY)

## 2024-04-10 ENCOUNTER — Ambulatory Visit: Admitting: Radiation Oncology

## 2024-04-10 ENCOUNTER — Ambulatory Visit

## 2024-04-10 DIAGNOSIS — G893 Neoplasm related pain (acute) (chronic): Secondary | ICD-10-CM | POA: Diagnosis not present

## 2024-04-10 LAB — BASIC METABOLIC PANEL WITH GFR
Anion gap: 14 (ref 5–15)
BUN: 7 mg/dL — ABNORMAL LOW (ref 8–23)
CO2: 22 mmol/L (ref 22–32)
Calcium: 9.9 mg/dL (ref 8.9–10.3)
Chloride: 99 mmol/L (ref 98–111)
Creatinine, Ser: 0.67 mg/dL (ref 0.61–1.24)
GFR, Estimated: >60 mL/min (ref >60)
Glucose, Bld: 116 mg/dL — ABNORMAL HIGH (ref 70–99)
Potassium: 4.5 mmol/L (ref 3.5–5.1)
Sodium: 135 mmol/L (ref 135–145)

## 2024-04-10 LAB — RAD ONC ARIA SESSION SUMMARY
Course Elapsed Days: 0
Plan Fractions Treated to Date: 1
Plan Prescribed Dose Per Fraction: 4 Gy
Plan Total Fractions Prescribed: 1
Plan Total Prescribed Dose: 4 Gy
Reference Point Dosage Given to Date: 4 Gy
Reference Point Session Dosage Given: 4 Gy
Session Number: 1

## 2024-04-10 LAB — TESTOSTERONE, FREE: Testosterone, Free: 1 pg/mL — ABNORMAL LOW (ref 6.6–18.1)

## 2024-04-10 LAB — MAGNESIUM: Magnesium: 2 mg/dL (ref 1.7–2.4)

## 2024-04-10 LAB — PHOSPHORUS: Phosphorus: 4.1 mg/dL (ref 2.5–4.6)

## 2024-04-10 MED ORDER — MORPHINE SULFATE 30 MG PO TABS
30.0000 mg | ORAL_TABLET | ORAL | 0 refills | Status: DC | PRN
  Filled 2024-04-10: qty 90, 15d supply, fill #0

## 2024-04-10 MED ORDER — MORPHINE SULFATE ER 15 MG PO TBCR
60.0000 mg | EXTENDED_RELEASE_TABLET | Freq: Three times a day (TID) | ORAL | Status: DC
  Administered 2024-04-10: 60 mg via ORAL
  Filled 2024-04-10: qty 4

## 2024-04-10 MED ORDER — SENNOSIDES-DOCUSATE SODIUM 8.6-50 MG PO TABS
2.0000 | ORAL_TABLET | Freq: Two times a day (BID) | ORAL | 0 refills | Status: DC
  Filled 2024-04-10: qty 360, 90d supply, fill #0

## 2024-04-10 MED ORDER — DEXAMETHASONE 4 MG PO TABS
4.0000 mg | ORAL_TABLET | Freq: Two times a day (BID) | ORAL | 0 refills | Status: DC
  Filled 2024-04-10: qty 60, 30d supply, fill #0

## 2024-04-10 MED ORDER — POLYETHYLENE GLYCOL 3350 17 GM/SCOOP PO POWD
17.0000 g | Freq: Two times a day (BID) | ORAL | 0 refills | Status: DC
  Filled 2024-04-10: qty 238, 14d supply, fill #0

## 2024-04-10 MED ORDER — MORPHINE SULFATE ER 60 MG PO TBCR
60.0000 mg | EXTENDED_RELEASE_TABLET | Freq: Three times a day (TID) | ORAL | 0 refills | Status: DC
  Filled 2024-04-10: qty 90, 30d supply, fill #0

## 2024-04-10 MED ORDER — FUROSEMIDE 20 MG PO TABS
20.0000 mg | ORAL_TABLET | Freq: Every day | ORAL | 0 refills | Status: DC
  Filled 2024-04-10: qty 30, 30d supply, fill #0

## 2024-04-10 MED ORDER — METHOCARBAMOL 500 MG PO TABS
500.0000 mg | ORAL_TABLET | Freq: Four times a day (QID) | ORAL | 0 refills | Status: DC | PRN
  Filled 2024-04-10: qty 60, 15d supply, fill #0

## 2024-04-10 NOTE — Progress Notes (Addendum)
 Physical Therapy Treatment Patient Details Name: David Townsend MRN: [id number] DOB: [birth year] Today's Date: 04/10/2024   History of Present Illness Pt is a 63 y.o. male presenting to [facility] ED for cancer-related pain. PMH significant for prostate cancer with spinal metastases, rib fx, anemia of chronic disease, GERD, HTN, HLD, cancer-related pain, tobacco use disorder and alcohol use disorder. Imaging shows progression of diffuse bone metastasis to skull, ribs, spine, pelvis, humerus, and femurs.    PT Comments  Pt received in bed, c/o ongoing severe back pain. Currently has not tolerated mobility or sitting EOB with PT due to pain level. Pt lives alone with steps to enter. Recent MRI results revealed Progressive osseous metastases throughout the lumbar and visualized lower thoracic spine, Fractures noted at T3-T6, T8, with lesions throughout Thoracic and Lumbar Spine, complete CSF effacement from T3 to T8 and mild spinal cord mass effect. Discussed concerns with pt and potential for future fractures. Pt educated on Log Roll technique importance of utilizing TLSO once delivered and purpose of use. Secure chat with multidisciplinary team regarding spinal concerns/precautions. TLSO ordered and to be delivered prior to further mobilization.   If plan is discharge home, recommend the following: Two people to help with walking and/or transfers;A lot of help with bathing/dressing/bathroom;Assist for transportation;Help with stairs or ramp for entrance   Can travel by private vehicle     No  Equipment Recommendations  None recommended by PT (TBD at next level of care)    Recommendations for Other Services       Precautions / Restrictions Precautions Precautions: Fall Recall of Precautions/Restrictions: Impaired Precaution/Restrictions Comments:  (Spinal Precautions, Log Roll) Required Braces or Orthoses: Other Brace Other Brace:  (TLSO when out of bed and mobilizing) Restrictions Weight Bearing  Restrictions Per Provider Order: No Other Position/Activity Restrictions:  (Spinal precautions)     Mobility  Bed Mobility Overal bed mobility: Needs Assistance Bed Mobility: Rolling Rolling: Mod assist         General bed mobility comments:  (Modified Log roll for education purposes, Pt unale to tolerate further mobility due to pain)    Transfers                   General transfer comment:  (Unable to tolerate)    Ambulation/Gait               General Gait Details:  (Unable to tolerate)   Stairs             Wheelchair Mobility     Tilt Bed    Modified Rankin (Stroke Patients Only)       Balance                                            Communication Communication Communication: No apparent difficulties  Cognition Arousal: Alert Behavior During Therapy: WFL for tasks assessed/performed, Anxious   PT - Cognitive impairments: No apparent impairments                         Following commands: Intact      Cueing Cueing Techniques: Verbal cues  Exercises      General Comments General comments (skin integrity, edema, etc.):  (Significant metastatic involvement throughtou Thoracic and Lumbar Spine. Concerns discussed with nursing and pt. Pt given information on Spinal Precautions, Log Roll technique and educated on  high risk for future fractures)      Pertinent Vitals/Pain Pain Assessment Pain Assessment: 0-10 Pain Score: 10-Worst pain ever Pain Descriptors / Indicators: Crushing, Discomfort, Grimacing, Guarding, Moaning Pain Intervention(s): Limited activity within patient's tolerance    Home Living                          Prior Function            PT Goals (current goals can now be found in the care plan section) Acute Rehab PT Goals Patient Stated Goal: no pain    Frequency    Min 2X/week      PT Plan      Co-evaluation              AM-PAC PT 6 Clicks Mobility    Outcome Measure  Help needed turning from your back to your side while in a flat bed without using bedrails?: A Lot Help needed moving from lying on your back to sitting on the side of a flat bed without using bedrails?: A Lot Help needed moving to and from a bed to a chair (including a wheelchair)?: Total Help needed standing up from a chair using your arms (e.g., wheelchair or bedside chair)?: Total Help needed to walk in hospital room?: Total Help needed climbing 3-5 steps with a railing? : Total 6 Click Score: 8    End of Session Equipment Utilized During Treatment: Other (comment) (Recommended TLSO to Multidisciplinary Team) Activity Tolerance: Patient limited by pain Patient left: in bed;with call bell/phone within reach Nurse Communication: Mobility status;Precautions;Other (comment) (Spinal Precautions) PT Visit Diagnosis: Unsteadiness on feet (R26.81);Pain Pain - part of body:  (Back)     Time: 8690-8669 PT Time Calculation (min) (ACUTE ONLY): 21 min  Charges:    $Therapeutic Activity: 8-22 mins PT General Charges $$ ACUTE PT VISIT: 1 Visit                    Darice Bohr, PTA  Darice JAYSON Bohr 04/10/2024, 4:02 PM

## 2024-04-10 NOTE — Progress Notes (Signed)
 Discharge medications delivered to patient in a secure bag.

## 2024-04-10 NOTE — Progress Notes (Addendum)
 Daily Progress Note   Patient Name: David Townsend       Date: 04/10/2024 DOB: 1960/11/07  Age: 63 y.o. MRN#: [id number] Attending Physician: Austria, Eric J, DO Primary Care Physician: Jaycee Greig PARAS, NP Admit Date: 04/07/2024 Length of Stay: 2 days  Reason for Consultation/Follow-up: Pain control  Subjective:   CC: Patient notes pain pain being greatly improved until he moved suddenly.  Following up regarding pain management.  Subjective:  Reviewed EMR including recent documentation from hospitalist.  At time of EMR review in past 24 hours patient has received as needed IV Dilaudid  1 mg x 4 doses and as needed MS IR 30 mg x 3 doses.  Patient continues to receive MS Contin  30 mg every 8 hours.  Presented to bedside to see patient.  Patient has not yet gone for radiation today.  Patient noted that he may be able to go home soon.  Discussed pain management at this time.  Patient noted that his pain had been much better controlled until he is suddenly moved and worsened his pain.  Discussed importance of taking it slowly in his current setting.  Also discussed based on his OME requirements, can appropriately increase long-acting MS Contin .  Patient agreeing with this plan.  Patient does feel that the as needed MS IR at 30 mg helps with his pain management when he receives it.  Noted could further adjust if needed. Also discussed that patient has not had a bowel movement despite increases on bowel regimen.  Discussed importance of patient having regular bowel movements while receiving opioids.  After discussion and explanation, patient willing to have suppository placed today after radiation.  All questions answered at that time.  Noted palliative medicine to continue following with patient's medical journey.  Discussed care with hospitalist, nurse, and PT to coordinate care.  PT requesting broadest for patient which hospitalist has ordered.  Review of Systems Pain improving Objective:    Vital Signs:  BP (!) 145/83 (BP Location: Left Arm)   Pulse 97   Temp 98.1 F (36.7 C) (Oral)   Resp 16   Ht 5' 10 (1.778 m)   Wt 68 kg   SpO2 92%   BMI 21.52 kg/m   Physical Exam: General: NAD, alert, pleasant Cardiovascular: RRR Respiratory: no increased work of breathing noted, not in respiratory distress Abdomen: not distended Psych: appropriately answers all questions  Assessment & Plan:   Assessment: Patient is a 63 year old male with a past medical history of metastatic prostate cancer to the bone, anemia of chronic medical disease, hypertension, hyperlipidemia, GERD, tobacco use disorder, and alcohol use disorder who presented to [name] ED on 04/07/2024 with progressive lower back pain and weakness.  Patient had reported few weeks prior to presentation this pain which is now limiting his ability to perform ADLs as well as poor appetite.  Patient does endorse continuing to drink 3-4 mixed drinks and 3-4 beers per day along with smoking 1 pack/day of cigarettes.  Since admission, patient has received management for cancer related pain, electrolyte abnormalities, and weakness/deconditioning.  Patient was noted to have lumbar spine MRI performed on 03/27/2024 which noted progressive osseous metastases throughout the lumbar and visualized lower thoracic spine; did not note extraosseous extension or pathologic fracture.  Patient is followed by urologist, Dr. Dewey, at [facility] for management of prostate cancer.  Palliative medicine team consulted to assist with pain management.   Recommendations/Plan: # Complex medical decision making/goals of care:  - Discussed care with patient  as detailed above in HPI.  Continuing with appropriate medical therapies at this time.  Patient will continue to receive radiation for metastatic disease.  Patient will follow-up with urology in the outpatient setting to continue discussion about further cancer directed therapies.  Palliative  medicine team will continue to follow along with patient's medical journey.  - Patient stated he received ACP documentation.  His friend, [name], took at home to review.  He noted that he would either get it notarized while here or in the outpatient setting.  Have discussed importance of patient having this paperwork since he does not have family to call in case of emergency.  -  Code Status: Full Code  # Symptom management: Patient is receiving these palliative interventions for symptom management with an intent to improve quality of life.   Patient is receiving these palliative interventions for symptom management with an intent to improve quality of life.                 - Pain, acute on chronic in setting of metastatic prostate cancer to bone Within the past 24 hours hours, patient has required as needed IV Dilaudid  for mg and as needed oral morphine  90 mg for opioid management. Based on OMEs calculated for this dose when reducing by 50% for incomplete cross tolerance, will appropriately start patient on the listed regimen below.                                - Continue Tylenol  to 1000 mg every 8 hours scheduled during the day                               - Continue MS IR to 30 mg every 4 hour as needed                               - Continue IV Dilaudid  to 1 mg every 4 hours as needed breakthrough pain after oral morphine    - Increase MS Contin  to 60 mg every 8 hours during the day                               - Continuing radiation as per to radiation oncology                  - Constipation                               - Continue with bisacodyl suppository daily as needed.  Patient agreeing with receiving suppository today after radiation.  Discussed with RN who will provide.                               - Continue senna to 2 tabs twice daily   - Continue MiraLAX 17 g twice daily  # Discharge Planning: Home with Palliative Services follow-up at [facility]  - Already placed  referral for outpatient palliative medicine follow-up at [facility]  Discussed with: Patient, hospitalist, bedside RN, PT  Thank you for allowing the palliative care team to participate in the care [name].  Tinnie Radar, DO Palliative Care Provider PMT #  [phone]  If patient remains symptomatic despite maximum doses, please call PMT at [phone] between 0700 and 1900. Outside of these hours, please call attending, as PMT does not have night coverage.

## 2024-04-10 NOTE — Progress Notes (Signed)
 Orthopedic Tech Progress Note Patient Details:  David Townsend [birth year] [id number] TLSO brace was given to PT in patient's room.  Ortho Devices Type of Ortho Device: Thoracolumbar corset (TLSO) Ortho Device/Splint Location: Spine Ortho Device/Splint Interventions: Ordered      Trishelle Devora E Aundre Hietala 04/10/2024, 1:53 PM

## 2024-04-10 NOTE — Discharge Summary (Signed)
 Physician Discharge Summary  [name] [name] DOB: 01/06/1961 DOA: 04/07/2024  PCP: Jaycee Greig PARAS, NP  Admit date: 04/07/2024 Discharge date: 04/10/2024  Admitted From: Home Disposition: Home  Recommendations for Outpatient Follow-up:  Follow up with PCP in 1-2 weeks Follow-up with urology, Dr. Christopher as scheduled Follow-up with radiation oncology for continued radiation treatments to restart on Monday, 04/13/2024 Follow-up with palliative care outpatient  Home Health: PT/OT Equipment/Devices: None (refused TLSO brace)  Discharge Condition: Stable CODE STATUS: Full code Diet recommendation: Heart healthy diet  History of present illness:  David Townsend is a 63 y.o. male with past medical history significant for prostate cancer with metastasis to bone, anemia of chronic medical disease, HTN, HLD, GERD, pain of malignancy, tobacco/alcohol use disorder who presented to [facility] ED on 04/07/2024 with progressive lower back pain, weakness.  Patient reports ongoing for a few weeks with now limitation with ability to perform ADLs.  Also reports poor appetite.  Recently had an increase in his oxycodone  from 5 to 10 mg, but still has had not significant relief with this.  Does endorse drinking 3-4 mixed drinks and 3-4 beers per day and smoking 1 pack/day of cigarettes.  Denies any recent trauma or falls.  No nausea/vomiting, fever/chills, no shortness of breath, no chest pain.  Patient has upcoming appointment with pain management clinic but given pain intolerable he sought further care in the ED.   In the ED, temperature 97.8 F, HR 93, RR 16, BP 167/94, SpO2 96% room air.  WBC 9.5, hemoglobin 0.5, platelet count 185.  Sodium 131, potassium 3.9, chloride 96, CO2 21, glucose 92, BUN 7, creatinine 0.80.  AST 41, ALT 18, total bilirubin 0.5.  EtOH level less than 15.  EKG with NSR.  Patient received 1 L NS bolus, IV Dilaudid  1 mg x 3.  TRH consulted for admission for further evaluation and  management of intractable pain of malignancy.  Hospital course:  Intractable pain of malignancy Prostate cancer metastasis to bone, progressive Patient presenting with progressive low back pain now with difficulty ambulating/performing ADLs.  Recently seen in the ED on 10/19, 10/24, 10/29 for same.  MR L-spine with without contrast 10/24 with progressive osseous metastasis throughout the lumbar and visualized lower thoracic spine without extraosseous extension or pathologic fracture, progressive moderate foraminal narrowing L3-4, right greater than left; progressive moderate subarticular and foraminal stenosis at L4-5, worse on the right, progressive moderate right and mild left foraminal stenosis at L5-S1.  CT angiogram chest/abdomen/pelvis 11/27/2023 also notes sclerotic metastasis posterior medial right ilium, posterior left ilium.  Reviewed by neurosurgery, Dr. Gillie who does not believe operative treatment would be beneficial with the significant tumor burden in the spine.  Neurosurgery recommend MRI of C-spine but patient declined.  Patient ordered TLSO brace but patient declined to utilize.  Seen by palliative care and adjusted his medications inpatient.  Discharging on MS Contin  60 mg PO q8, MS IR 30 mg PO q4h PRN severe pain, Decadron  4mg  PO q12h, Robaxin 500 mg p.o. PO q6h PRN muscle spasms. Continue Erleada  120 mg PO daily. Urology plans to proceed with Xofigo on an outpatient basis.  Resume radiation therapy on Monday 04/13/2024.  Outpatient follow-up palliative care.   Hyponatremia Sodium 131 on admission, likely secondary to dehydration in the setting of poor oral intake, EtOH use outpatient.  Received IV fluids.  Sodium improved to 135 at time of discharge.   Hypomagnesemia Repleted.     HTN Continue amlodipine , valsartan .  Restart home furosemide   20 mg p.o. daily.   HLD Atorvastatin  20 mg p.o. daily   GERD Currently not taking PPI outpatient.   EtOH use disorder Endorses  drinking 3-4 mixed drinks and 3-4 beers per day.  EtOH level less than 15.  Counseled on need for complete cessation/abstinence.   Tobacco use disorder Dors is smoking 1 pack/day of cigarettes.  Counseled on need for complete cessation.     Weakness/ability/deconditioning: TOC consulted for home health on discharge.  Discharge Diagnoses:  Principal Problem:   Cancer-related pain Active Problems:   Chronic low back pain without sciatica   Generalized weakness   Palliative care encounter   Goals of care, counseling/discussion   Medication management   Counseling and coordination of care   Secondary malignant neoplasm of bone (HCC)   Malignant neoplasm metastatic to bone [facility])    Discharge Instructions  Discharge Instructions     Amb Referral to Palliative Care   Complete by: As directed    Call MD for:  difficulty breathing, headache or visual disturbances   Complete by: As directed    Call MD for:  extreme fatigue   Complete by: As directed    Call MD for:  persistant dizziness or light-headedness   Complete by: As directed    Call MD for:  persistant nausea and vomiting   Complete by: As directed    Call MD for:  severe uncontrolled pain   Complete by: As directed    Call MD for:  temperature >100.4   Complete by: As directed    Diet - low sodium heart healthy   Complete by: As directed    Increase activity slowly   Complete by: As directed       Allergies as of 04/10/2024   No Known Allergies      Medication List     STOP taking these medications    folic acid  1 MG tablet Commonly known as: FOLVITE    nicotine  21 mg/24hr patch Commonly known as: NICODERM CQ  - dosed in mg/24 hours   oxyCODONE  5 MG immediate release tablet Commonly known as: Roxicodone    Oxycodone  HCl 10 MG Tabs   pantoprazole  40 MG tablet Commonly known as: PROTONIX    thiamine  100 MG tablet Commonly known as: Vitamin B-1       TAKE these medications    acetaminophen  500 MG  tablet Commonly known as: TYLENOL  Take 500 mg by mouth every 6 (six) hours as needed for moderate pain (pain score 4-6).   amLODipine  10 MG tablet Commonly known as: NORVASC  Take 1 tablet (10 mg total) by mouth daily.   atorvastatin  20 MG tablet Commonly known as: LIPITOR Take 1 tablet (20 mg total) by mouth daily.   cyclobenzaprine 10 MG tablet Commonly known as: FLEXERIL Take 1 tablet (10 mg total) by mouth every 8 (eight) hours as needed for muscle spasms.   dexamethasone  4 MG tablet Commonly known as: DECADRON  Take 1 tablet (4 mg total) by mouth every 12 (twelve) hours.   Erleada  60 MG tablet Generic drug: apalutamide  Take 120 mg by mouth daily.   furosemide  20 MG tablet Commonly known as: LASIX  Take 1 tablet (20 mg total) by mouth daily.   ibuprofen  800 MG tablet Commonly known as: ADVIL  Take 1 tablet (800 mg total) by mouth every 8 (eight) hours as needed for fever, mild pain or headache (alternate with tylenol  for fever).   Iron  (Ferrous Sulfate ) 325 (65 Fe) MG Tabs Take 325 mg by mouth daily.  lidocaine  5 % Commonly known as: Lidoderm  Place 1 patch onto the skin daily. Remove & Discard patch within 12 hours or as directed by MD   methocarbamol 500 MG tablet Commonly known as: ROBAXIN Take 1 tablet (500 mg total) by mouth every 6 (six) hours as needed for muscle spasms.   morphine  60 MG 12 hr tablet Commonly known as: MS CONTIN  Take 1 tablet (60 mg total) by mouth every 8 (eight) hours.   morphine  30 MG tablet Commonly known as: MSIR Take 1 tablet (30 mg total) by mouth every 4 (four) hours as needed for severe pain (pain score 7-10) or moderate pain (pain score 4-6).   multivitamin with minerals Tabs tablet Take 1 tablet by mouth daily.   polyethylene glycol 17 g packet Commonly known as: MIRALAX / GLYCOLAX Take 17 g by mouth 2 (two) times daily.   senna-docusate 8.6-50 MG tablet Commonly known as: Senokot-S Take 2 tablets by mouth 2 (two) times  daily. What changed: how much to take   valsartan  40 MG tablet Commonly known as: DIOVAN  Take 1 tablet (40 mg total) by mouth daily.   VITAMIN C PO Take 1 tablet by mouth daily.   VITAMIN D  PO Take 1 capsule by mouth daily.        Follow-up Information     Lorren Greig PARAS, NP. Schedule an appointment as soon as possible for a visit in 1 week(s).   Specialty: Nurse Practitioner Contact information: [address] [place] [address] [phone]         Alvaro Ricardo KATHEE Mickey., MD. Go to.   Specialty: Urology Contact information: [address] [place] [address] [phone]                No Known Allergies  Consultations: Palliative care Urology Radiation oncology   Procedures/Studies: DG CHEST PORT 1 VIEW Result Date: 04/10/2024 EXAM: 1 VIEW(S) XRAY OF THE CHEST 04/10/2024 07:07:00 AM COMPARISON: 11/26/2023 CLINICAL HISTORY: Dyspnea FINDINGS: LUNGS AND PLEURA: Low lung volumes. Bilateral ill defined peripheral and basilar airspace opacities. Mild predominantly perihilar interstitial prominence. Possible small left pleural effusion. No pulmonary edema. No pneumothorax. HEART AND MEDIASTINUM: No acute abnormality of the cardiac and mediastinal silhouettes. BONES AND SOFT TISSUES: No acute osseous abnormality. IMPRESSION: 1. Peripheral and basilar airspace opacities with mild predominantly perihilar interstitial prominence, compatible with multifocal infection or inflammation. 2. Low lung volumes. 3. Possible small left pleural effusion. Electronically signed by: Katheleen Faes MD 04/10/2024 11:13 AM EST RP Workstation: [id number]   MR THORACIC SPINE W WO CONTRAST Result Date: 04/08/2024 EXAM: MRI THORACIC SPINE WITH AND WITHOUT INTRAVENOUS CONTRAST 04/08/2024 03:43:35 PM TECHNIQUE: Multiplanar multisequence MRI of the thoracic spine was performed with and without the administration of intravenous contrast. COMPARISON: MRI thoracic spine  09/19/2022. MRI lumbar spine 03/27/2024. CTA chest, abdomen, and pelvis 11/27/2023. CLINICAL HISTORY: Metastatic disease evaluation. Severe back pain. History of metastatic prostate cancer. FINDINGS: BONES AND ALIGNMENT: There is widespread metastatic disease involving every level in the thoracic spine which has greatly progressed from the prior thoracic spine MRI. There is complete or near complete marrow replacement in the T2 through T8 vertebral bodies with large lesions noted at T11 and T12 as well. Posterior element involvement is particularly extensive from T4 to T6. Multiple compression fractures are present. A T4 compression fracture is new from the prior thoracic MRI but was present on the interval chest CT with mildly progressive vertebral body height loss, now measuring 25%. A  mild T3 compression fracture is new or progressive, with 10% vertebral body height loss. There are new T5 and T6 compression fractures with 20% height loss each. A T8 compression fracture is new or progressive with 30% height loss. Assessment for marrow edema associated with these fractures is limited due to the underlying widespread marrow signal abnormality; however, there is a visible unhealed fracture line within the T8 vertebral body extending to the posterior cortex. T8 retropulsion measures 2 to 3 mm. Mildly prominent epidural enhancement, most prominent ventrally from T5 to T8, is favored to reflect prominent epidural venous plexus without masslike enhancement seen to clearly indicate epidural tumor, although minimal epidural tumor in this region is not excluded. SPINAL CORD: There is apparent mild hyperintensity throughout the majority of the thoracic spinal cord on the sagittal STIR sequence which is not clearly confirmed on the standard sagittal T2 sequence. Assessment of the spinal cord on axial images is limited due to severe motion artifact. SOFT TISSUES: Dorsal epidural lipomatosis has increased from the prior  thoracic spine MRI and results in spinal stenosis with essentially complete CSF effacement from T3 to T8. There is mild mass effect on the spinal cord in the region from T3 to T8. IMPRESSION: 1. Progressive, widespread metastatic disease throughout the thoracic spine. 2. Multiple new or progressive pathologic compression fractures, most notably at T8 where there is an unhealed fracture with 30% height loss and mild retropulsion. 3. 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 Mild spinal cord edema is not excluded with assessment limited by motion. Electronically signed by: Dasie Hamburg MD 04/08/2024 04:17 PM EST RP Workstation: [id number]   MR Lumbar Spine W Wo Contrast Result Date: 03/27/2024 EXAM: MR Lumbar Spine with and without intravenous contrast. 03/27/2024 04:42:52 PM TECHNIQUE: Multiplanar multisequence MRI of the lumbar spine was performed with and without the administration of intravenous contrast. COMPARISON: MRI of the lumbar spine 17/24. CLINICAL HISTORY: Metastatic disease evaluation. Chronic back pain. History of prostate cancer. Recent PET scan shown metastases. Seen at [facility] and given oxy but no relief. FINDINGS: BONES AND ALIGNMENT: Progressive metastases are present throughout the lumbar and visualized lower thoracic spine. Diffuse enhancement is present throughout the L2 vertebral body. A 15 mm lesion is present along the posterior inferior L2 vertebral body. A lesion anteriorly on the left at L4 measures up to 21 mm. Enhancing lesion extends through the left pedicle of L2 and into the posterior elements. A left lateral lesion at T12 extends into the left pedicle measuring 3.4 cm. No extraosseous extension of metastases is present. No pathologic fractures are present. Normal alignment. SPINAL CORD: The conus terminates normally. SOFT TISSUES: No acute abnormality. T12-L1: A left lateral lesion at T12  extends into the left pedicle measuring 3.4 cm. No disc herniation. No spinal canal stenosis or neural foraminal narrowing. L1-L2: No disc herniation. No spinal canal stenosis or neural foraminal narrowing. L2-L3: Diffuse enhancement is present throughout the L2 vertebral body. A 15 mm lesion is present along the posterior inferior L2 vertebral body. Enhancing lesion extends through the left pedicle of L2 and into the posterior elements. No disc herniation. No spinal canal stenosis or neural foraminal narrowing. L3-L4: A broad-based disc protrusion is present. Moderate foraminal narrowing has progressed bilaterally, right greater than left. L4-L5: A lesion anteriorly on the left at L4 measures up to 21 mm. A broad-based disc protrusion and moderate facet hypertrophy is present. Progressive moderate subarticular and  foraminal stenosis is worse right than left. L5-S1: A rightward disc protrusion and moderate facet hypertrophy results in progressive moderate right and mild left foraminal stenosis similar to the prior study. IMPRESSION: 1. Progressive osseous metastases throughout the lumbar and visualized lower thoracic spine without extraosseous extension or pathologic fracture. 2. Progressive moderate foraminal narrowing at L3-4, right greater than left. 3. Progressive moderate subarticular and foraminal stenosis at L4-5, worse on the right. 4. Progressive moderate right and mild left foraminal stenosis at L5-S1, similar to prior study. Electronically signed by: Lonni Necessary MD 03/27/2024 05:06 PM EDT RP Workstation: [id number]     Subjective: Patient seen examined bedside, lying in bed.  Just returned from radiation therapy.  Refusing to utilize a TLSO brace.  Adamant about discharging home.  States he can manage fine at home and will return to radiation therapy on Monday.  No other specific questions or concerns at this time.  Denies headache, no dizziness, no chest pain, no palpitations, no shortness  of breath, no cough/congestion, no fever/chills/night sweats, no nausea/vomiting/diarrhea, no focal weakness, no fatigue, no paresthesias.  Patient instructed on return precautions.  Discharge Exam: Vitals:   04/10/24 1014 04/10/24 1350  BP: 134/78 (!) 159/85  Pulse:  (!) 105  Resp:  16  Temp:  97.9 F (36.6 C)  SpO2:  96%   Vitals:   04/10/24 0452 04/10/24 0512 04/10/24 1014 04/10/24 1350  BP: (!) 145/83  134/78 (!) 159/85  Pulse: 97   (!) 105  Resp: 16   16  Temp: 98.1 F (36.7 C)   97.9 F (36.6 C)  TempSrc: Oral   Oral  SpO2: 90% 92%  96%  Weight:      Height:        Physical Exam: GEN: NAD, alert and oriented x 3, chronically ill in appearance HEENT: NCAT, PERRL, EOMI, sclera clear, MMM PULM: CTAB w/o wheezes/crackles, normal respiratory effort CV: RRR w/o M/G/R GI: abd soft, NTND, + BS GU: Foley catheter noted (chronic) MSK: no peripheral edema, moves all extremities independently NEURO: CN II-XII intact, no focal deficits, sensation to light touch intact PSYCH: normal mood/affect Integumentary: No concerning rashes/lesions/wounds on exposed skin surfaces    The results of significant diagnostics from this hospitalization (including imaging, microbiology, ancillary and laboratory) are listed below for reference.     Microbiology: No results found for this or any previous visit (from the past 240 hours).   Labs: BNP (last 3 results) No results for input(s): BNP in the last 8760 hours. Basic Metabolic Panel: Recent Labs  Lab 04/07/24 1841 04/08/24 0557 04/09/24 0622 04/10/24 0538  NA 131* 134* 135 135  K 3.9 4.0 4.5 4.5  CL 96* 101 99 99  CO2 21* 23 25 22   GLUCOSE 92 98 98 116*  BUN 7* 7* 6* 7*  CREATININE 0.80 0.71 0.68 0.67  CALCIUM  10.0 9.4 10.1 9.9  MG  --  1.6* 1.9 2.0  PHOS  --  3.2  --  4.1   Liver Function Tests: Recent Labs  Lab 04/07/24 1841  AST 41  ALT 18  ALKPHOS 257*  BILITOT 0.5  PROT 6.7  ALBUMIN 4.2   No results for  input(s): LIPASE, AMYLASE in the last 168 hours. No results for input(s): AMMONIA in the last 168 hours. CBC: Recent Labs  Lab 04/07/24 1841 04/08/24 0557  WBC 9.5 8.6  NEUTROABS 6.6  --   HGB 11.5* 11.3*  HCT 35.2* 33.2*  MCV 114.3* 114.9*  PLT 185  171   Cardiac Enzymes: No results for input(s): CKTOTAL, CKMB, CKMBINDEX, TROPONINI in the last 168 hours. BNP: Invalid input(s): POCBNP CBG: No results for input(s): GLUCAP in the last 168 hours. D-Dimer No results for input(s): DDIMER in the last 72 hours. Hgb A1c No results for input(s): HGBA1C in the last 72 hours. Lipid Profile No results for input(s): CHOL, HDL, LDLCALC, TRIG, CHOLHDL, LDLDIRECT in the last 72 hours. Thyroid  function studies No results for input(s): TSH, T4TOTAL, T3FREE, THYROIDAB in the last 72 hours.  Invalid input(s): FREET3 Anemia work up No results for input(s): VITAMINB12, FOLATE, FERRITIN, TIBC, IRON , RETICCTPCT in the last 72 hours. Urinalysis    Component Value Date/Time   COLORURINE YELLOW 11/28/2023 2138   APPEARANCEUR CLEAR 11/28/2023 2138   LABSPEC 1.006 11/28/2023 2138   PHURINE 5.0 11/28/2023 2138   GLUCOSEU NEGATIVE 11/28/2023 2138   HGBUR MODERATE (A) 11/28/2023 2138   BILIRUBINUR NEGATIVE 11/28/2023 2138   BILIRUBINUR negative 08/12/2019 1130   KETONESUR NEGATIVE 11/28/2023 2138   PROTEINUR NEGATIVE 11/28/2023 2138   UROBILINOGEN 0.2 08/12/2019 1130   NITRITE POSITIVE (A) 11/28/2023 2138   LEUKOCYTESUR SMALL (A) 11/28/2023 2138   Sepsis Labs Recent Labs  Lab 04/07/24 1841 04/08/24 0557  WBC 9.5 8.6   Microbiology No results found for this or any previous visit (from the past 240 hours).   Time coordinating discharge: Over 30 minutes  SIGNED:   Camellia PARAS Suriyah Vergara, DO  [facility] 04/10/2024, 4:34 PM

## 2024-04-10 NOTE — Progress Notes (Addendum)
 PROGRESS NOTE    David Townsend  [name] DOB: [birth year] DOA: 04/07/2024 PCP: Jaycee Greig PARAS, NP    Brief Narrative:   David Townsend is a 63 y.o. male with past medical history significant for prostate cancer with metastasis to bone, anemia of chronic medical disease, HTN, HLD, GERD, pain of malignancy, tobacco/alcohol use disorder who presented to [facility] ED on 04/07/2024 with progressive lower back pain, weakness.  Patient reports ongoing for a few weeks with now limitation with ability to perform ADLs.  Also reports poor appetite.  Recently had an increase in his oxycodone  from 5 to 10 mg, but still has had not significant relief with this.  Does endorse drinking 3-4 mixed drinks and 3-4 beers per day and smoking 1 pack/day of cigarettes.  Denies any recent trauma or falls.  No nausea/vomiting, fever/chills, no shortness of breath, no chest pain.  Patient has upcoming appointment with pain management clinic but given pain intolerable he sought further care in the ED.  In the ED, temperature 97.8 F, HR 93, RR 16, BP 167/94, SpO2 96% room air.  WBC 9.5, hemoglobin 0.5, platelet count 185.  Sodium 131, potassium 3.9, chloride 96, CO2 21, glucose 92, BUN 7, creatinine 0.80.  AST 41, ALT 18, total bilirubin 0.5.  EtOH level less than 15.  EKG with NSR.  Patient received 1 L NS bolus, IV Dilaudid  1 mg x 3.  TRH consulted for admission for further evaluation and management of intractable pain of malignancy.  Assessment & Plan:   Intractable pain of malignancy Prostate cancer metastasis to bone, progressive Patient presenting with progressive low back pain now with difficulty ambulating/performing ADLs.  Recently seen in the ED on 10/19, 10/24, 10/29 for same.  MR L-spine with without contrast 10/24 with progressive osseous metastasis throughout the lumbar and visualized lower thoracic spine without extraosseous extension or pathologic fracture, progressive moderate foraminal narrowing L3-4, right  greater than left; progressive moderate subarticular and foraminal stenosis at L4-5, worse on the right, progressive moderate right and mild left foraminal stenosis at L5-S1.  CT angiogram chest/abdomen/pelvis 11/27/2023 also notes sclerotic metastasis posterior medial right ilium, posterior left ilium.  Reviewed by neurosurgery, Dr. Gillie who does not believe operative treatment would be beneficial with the significant tumor burden in the spine.  Neurosurgery recommend MRI of C-spine but patient declined. -- Palliative care following, appreciate assistance -- MS Contin  60 mg PO q8h -- MS IR 30 mg PO q4h PRN severe pain -- Decadron  4mg  PO q12h -- Dilaudid  1 mg IV q4h PRN severe pain -- Robaxin 500 mg p.o. PO q6h PRN muscle spasms -- Lidocaine  patch -- Continue Erleada  120 mg PO daily -- Urology plans to proceed with Xofigo on an outpatient basis -- Radiation oncology plans to initiate XRT today -- TLSO brace ordered  Hyponatremia Sodium 131 on admission, likely secondary to dehydration in the setting of poor oral intake, EtOH use outpatient.  Received IV fluids. -- Na 131>134>135>135  Hypomagnesemia Repleted.  Magnesium  1.9 today.  HTN -- Amlodipine  10 mg p.o. daily -- Irbesartan  75 mg p.o. daily (substituted for home valsartan  40mg  PO daily) -- Hydralazine 10 mg IV every 8 hours as needed SBP >180  HLD -- Atorvastatin  20 mg p.o. daily  GERD Currently not taking PPI outpatient.  EtOH use disorder Endorses drinking 3-4 mixed drinks and 3-4 beers per day.  EtOH level less than 15.  Counseled on need for complete cessation/abstinence. -- CIWA protocol with symptom triggered Ativan   Tobacco use  disorder Dors is smoking 1 pack/day of cigarettes.  Counseled on need for complete cessation.   -- Nicotine  patch   Weakness/ability/deconditioning: -- PT/OT currently recommending SNF. -- TOC consulted   DVT prophylaxis: enoxaparin  (LOVENOX ) injection 40 mg Start: 04/08/24 1000     Code Status: Full Code Family Communication: No family present at bedside  Disposition Plan:  Level of care: Med-Surg Status is: Inpatient Remains inpatient appropriate because: Pain control, SNF placement, will start radiation treatments    Consultants:  Palliative care Urology Radiation oncology  Procedures:  None  Antimicrobials:  None   Subjective: Patient seen examined bedside, lying in bed.  Was placed on oxygen overnight.  Complains of mild pain to his back, although improved.  Plan for initiation of radiation therapy today.  Patient hopeful for discharge home this afternoon.  Discussed with palliative care, they are increasing MS Contin  dose this morning. Patient without any other complaints or concerns at this time.  Denies headache, no vision changes, no chest pain, no palpitations, no shortness of breath, no abdominal pain, no fever/chills/night sweats, no nausea/vomiting/diarrhea, no focal weakness, no fatigue, no paresthesias.  No acute events overnight per nursing staff.  Objective: Vitals:   04/09/24 2049 04/10/24 0452 04/10/24 0512 04/10/24 1014  BP:  (!) 145/83  134/78  Pulse:  97    Resp:  16    Temp:  98.1 F (36.7 C)    TempSrc:  Oral    SpO2: 92% 90% 92%   Weight:      Height:        Intake/Output Summary (Last 24 hours) at 04/10/2024 1242 Last data filed at 04/10/2024 0600 Gross per 24 hour  Intake 720 ml  Output 1900 ml  Net -1180 ml   Filed Weights   04/07/24 1836 04/08/24 1607  Weight: 70.3 kg 68 kg    Examination:  Physical Exam: GEN: NAD, alert and oriented x 3, chronically ill in appearance HEENT: NCAT, PERRL, EOMI, sclera clear, MMM PULM: CTAB w/o wheezes/crackles, normal respiratory effort, on 2L [place] CV: RRR w/o M/G/R GI: abd soft, NTND, + BS GU: Foley catheter noted (chronic) MSK: no peripheral edema, moves all extremities independently NEURO: CN II-XII intact, no focal deficits, sensation to light touch intact PSYCH: normal  mood/affect Integumentary: No concerning rashes/lesions/wounds on exposed skin surfaces    Data Reviewed: I have personally reviewed following labs and imaging studies  CBC: Recent Labs  Lab 04/07/24 1841 04/08/24 0557  WBC 9.5 8.6  NEUTROABS 6.6  --   HGB 11.5* 11.3*  HCT 35.2* 33.2*  MCV 114.3* 114.9*  PLT 185 171   Basic Metabolic Panel: Recent Labs  Lab 04/07/24 1841 04/08/24 0557 04/09/24 0622 04/10/24 0538  NA 131* 134* 135 135  K 3.9 4.0 4.5 4.5  CL 96* 101 99 99  CO2 21* 23 25 22   GLUCOSE 92 98 98 116*  BUN 7* 7* 6* 7*  CREATININE 0.80 0.71 0.68 0.67  CALCIUM  10.0 9.4 10.1 9.9  MG  --  1.6* 1.9 2.0  PHOS  --  3.2  --  4.1   GFR: Estimated Creatinine Clearance: 90.9 mL/min (by C-G formula based on SCr of 0.67 mg/dL). Liver Function Tests: Recent Labs  Lab 04/07/24 1841  AST 41  ALT 18  ALKPHOS 257*  BILITOT 0.5  PROT 6.7  ALBUMIN 4.2   No results for input(s): LIPASE, AMYLASE in the last 168 hours. No results for input(s): AMMONIA in the last 168 hours. Coagulation Profile:  No results for input(s): INR, PROTIME in the last 168 hours. Cardiac Enzymes: No results for input(s): CKTOTAL, CKMB, CKMBINDEX, TROPONINI in the last 168 hours. BNP (last 3 results) No results for input(s): PROBNP in the last 8760 hours. HbA1C: No results for input(s): HGBA1C in the last 72 hours. CBG: No results for input(s): GLUCAP in the last 168 hours. Lipid Profile: No results for input(s): CHOL, HDL, LDLCALC, TRIG, CHOLHDL, LDLDIRECT in the last 72 hours. Thyroid  Function Tests: No results for input(s): TSH, T4TOTAL, FREET4, T3FREE, THYROIDAB in the last 72 hours. Anemia Panel: No results for input(s): VITAMINB12, FOLATE, FERRITIN, TIBC, IRON , RETICCTPCT in the last 72 hours. Sepsis Labs: No results for input(s): PROCALCITON, LATICACIDVEN in the last 168 hours.  No results found for this or any  previous visit (from the past 240 hours).       Radiology Studies: DG CHEST PORT 1 VIEW Result Date: 04/10/2024 EXAM: 1 VIEW(S) XRAY OF THE CHEST 04/10/2024 07:07:00 AM COMPARISON: 11/26/2023 CLINICAL HISTORY: Dyspnea FINDINGS: LUNGS AND PLEURA: Low lung volumes. Bilateral ill defined peripheral and basilar airspace opacities. Mild predominantly perihilar interstitial prominence. Possible small left pleural effusion. No pulmonary edema. No pneumothorax. HEART AND MEDIASTINUM: No acute abnormality of the cardiac and mediastinal silhouettes. BONES AND SOFT TISSUES: No acute osseous abnormality. IMPRESSION: 1. Peripheral and basilar airspace opacities with mild predominantly perihilar interstitial prominence, compatible with multifocal infection or inflammation. 2. Low lung volumes. 3. Possible small left pleural effusion. Electronically signed by: Katheleen Faes MD 04/10/2024 11:13 AM EST RP Workstation: [id number]   MR THORACIC SPINE W WO CONTRAST Result Date: 04/08/2024 EXAM: MRI THORACIC SPINE WITH AND WITHOUT INTRAVENOUS CONTRAST 04/08/2024 03:43:35 PM TECHNIQUE: Multiplanar multisequence MRI of the thoracic spine was performed with and without the administration of intravenous contrast. COMPARISON: MRI thoracic spine 09/19/2022. MRI lumbar spine 03/27/2024. CTA chest, abdomen, and pelvis 11/27/2023. CLINICAL HISTORY: Metastatic disease evaluation. Severe back pain. History of metastatic prostate cancer. FINDINGS: BONES AND ALIGNMENT: There is widespread metastatic disease involving every level in the thoracic spine which has greatly progressed from the prior thoracic spine MRI. There is complete or near complete marrow replacement in the T2 through T8 vertebral bodies with large lesions noted at T11 and T12 as well. Posterior element involvement is particularly extensive from T4 to T6. Multiple compression fractures are present. A T4 compression fracture is new from the prior thoracic MRI but was  present on the interval chest CT with mildly progressive vertebral body height loss, now measuring 25%. A mild T3 compression fracture is new or progressive, with 10% vertebral body height loss. There are new T5 and T6 compression fractures with 20% height loss each. A T8 compression fracture is new or progressive with 30% height loss. Assessment for marrow edema associated with these fractures is limited due to the underlying widespread marrow signal abnormality; however, there is a visible unhealed fracture line within the T8 vertebral body extending to the posterior cortex. T8 retropulsion measures 2 to 3 mm. Mildly prominent epidural enhancement, most prominent ventrally from T5 to T8, is favored to reflect prominent epidural venous plexus without masslike enhancement seen to clearly indicate epidural tumor, although minimal epidural tumor in this region is not excluded. SPINAL CORD: There is apparent mild hyperintensity throughout the majority of the thoracic spinal cord on the sagittal STIR sequence which is not clearly confirmed on the standard sagittal T2 sequence. Assessment of the spinal cord on axial images is limited due to severe motion artifact. SOFT TISSUES: Dorsal  epidural lipomatosis has increased from the prior thoracic spine MRI and results in spinal stenosis with essentially complete CSF effacement from T3 to T8. There is mild mass effect on the spinal cord in the region from T3 to T8. IMPRESSION: 1. Progressive, widespread metastatic disease throughout the thoracic spine. 2. Multiple new or progressive pathologic compression fractures, most notably at T8 where there is an unhealed fracture with 30% height loss and mild retropulsion. 3. 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 Mild spinal cord edema is not excluded with assessment limited by motion. Electronically signed by: Dasie Hamburg MD 04/08/2024  04:17 PM EST RP Workstation: [id number]        Scheduled Meds:  acetaminophen   1,000 mg Oral Q8H   amLODipine   10 mg Oral Daily   apalutamide   120 mg Oral Daily   atorvastatin   20 mg Oral Daily   Chlorhexidine  Gluconate Cloth  6 each Topical Daily   dexamethasone   4 mg Oral Q12H   enoxaparin  (LOVENOX ) injection  40 mg Subcutaneous Q24H   folic acid   1 mg Oral Daily   irbesartan   75 mg Oral Daily   lidocaine   1 patch Transdermal Q24H   morphine   60 mg Oral Q8H   multivitamin with minerals  1 tablet Oral Daily   nicotine   21 mg Transdermal Daily   polyethylene glycol  17 g Oral BID   senna-docusate  2 tablet Oral BID   thiamine   100 mg Oral Daily   Or   thiamine   100 mg Intravenous Daily   Continuous Infusions:   LOS: 2 days    Time spent: 42 minutes spent on 04/10/2024 caring for this patient face-to-face including chart review, ordering labs/tests, documenting, discussion with nursing staff, consultants, updating family and interview/physical exam    Camellia PARAS Garnet Chatmon, DO [facility] Available via Epic secure chat 7am-7pm After these hours, please refer to coverage provider listed on [email or web address] 04/10/2024, 12:42 PM

## 2024-04-10 NOTE — Plan of Care (Signed)
 Pt given discharge instructions, he verbalized his understanding. Pt going home by private vehicle, Piv intact upon removal, vitals stable.   Problem: Education: Goal: Knowledge of General Education information will improve Description: Including pain rating scale, medication(s)/side effects and non-pharmacologic comfort measures Outcome: Adequate for Discharge   Problem: Health Behavior/Discharge Planning: Goal: Ability to manage health-related needs will improve Outcome: Adequate for Discharge   Problem: Clinical Measurements: Goal: Ability to maintain clinical measurements within normal limits will improve Outcome: Adequate for Discharge Goal: Will remain free from infection Outcome: Adequate for Discharge Goal: Diagnostic test results will improve Outcome: Adequate for Discharge Goal: Respiratory complications will improve Outcome: Adequate for Discharge Goal: Cardiovascular complication will be avoided Outcome: Adequate for Discharge   Problem: Activity: Goal: Risk for activity intolerance will decrease Outcome: Adequate for Discharge   Problem: Nutrition: Goal: Adequate nutrition will be maintained Outcome: Adequate for Discharge   Problem: Coping: Goal: Level of anxiety will decrease Outcome: Adequate for Discharge   Problem: Elimination: Goal: Will not experience complications related to bowel motility Outcome: Adequate for Discharge Goal: Will not experience complications related to urinary retention Outcome: Adequate for Discharge   Problem: Pain Managment: Goal: General experience of comfort will improve and/or be controlled Outcome: Adequate for Discharge   Problem: Safety: Goal: Ability to remain free from injury will improve Outcome: Adequate for Discharge   Problem: Skin Integrity: Goal: Risk for impaired skin integrity will decrease Outcome: Adequate for Discharge

## 2024-04-13 ENCOUNTER — Inpatient Hospital Stay (HOSPITAL_COMMUNITY)
Admission: EM | Admit: 2024-04-13 | Discharge: 2024-05-04 | DRG: 722 | Disposition: A | Discharge status: Skilled Nursing Facility | Attending: Internal Medicine | Admitting: Internal Medicine

## 2024-04-13 ENCOUNTER — Encounter (HOSPITAL_COMMUNITY): Payer: Self-pay | Admitting: Internal Medicine

## 2024-04-13 ENCOUNTER — Other Ambulatory Visit: Payer: Self-pay

## 2024-04-13 ENCOUNTER — Emergency Department (HOSPITAL_COMMUNITY)

## 2024-04-13 ENCOUNTER — Telehealth: Payer: Self-pay

## 2024-04-13 ENCOUNTER — Ambulatory Visit

## 2024-04-13 DIAGNOSIS — R296 Repeated falls: Secondary | ICD-10-CM | POA: Diagnosis not present

## 2024-04-13 DIAGNOSIS — Z8549 Personal history of malignant neoplasm of other male genital organs: Secondary | ICD-10-CM

## 2024-04-13 DIAGNOSIS — Z515 Encounter for palliative care: Secondary | ICD-10-CM

## 2024-04-13 DIAGNOSIS — M549 Dorsalgia, unspecified: Secondary | ICD-10-CM | POA: Diagnosis not present

## 2024-04-13 DIAGNOSIS — R5381 Other malaise: Secondary | ICD-10-CM | POA: Diagnosis present

## 2024-04-13 DIAGNOSIS — T380X5A Adverse effect of glucocorticoids and synthetic analogues, initial encounter: Secondary | ICD-10-CM | POA: Diagnosis present

## 2024-04-13 DIAGNOSIS — R531 Weakness: Secondary | ICD-10-CM

## 2024-04-13 DIAGNOSIS — M8448XA Pathological fracture, other site, initial encounter for fracture: Secondary | ICD-10-CM | POA: Diagnosis present

## 2024-04-13 DIAGNOSIS — R413 Other amnesia: Secondary | ICD-10-CM | POA: Diagnosis present

## 2024-04-13 DIAGNOSIS — E876 Hypokalemia: Secondary | ICD-10-CM | POA: Diagnosis present

## 2024-04-13 DIAGNOSIS — Z5320 Procedure and treatment not carried out because of patient's decision for unspecified reasons: Secondary | ICD-10-CM | POA: Diagnosis present

## 2024-04-13 DIAGNOSIS — E785 Hyperlipidemia, unspecified: Secondary | ICD-10-CM | POA: Diagnosis present

## 2024-04-13 DIAGNOSIS — Y92239 Unspecified place in hospital as the place of occurrence of the external cause: Secondary | ICD-10-CM | POA: Diagnosis not present

## 2024-04-13 DIAGNOSIS — Z79899 Other long term (current) drug therapy: Secondary | ICD-10-CM

## 2024-04-13 DIAGNOSIS — E860 Dehydration: Secondary | ICD-10-CM | POA: Diagnosis present

## 2024-04-13 DIAGNOSIS — F17210 Nicotine dependence, cigarettes, uncomplicated: Secondary | ICD-10-CM | POA: Diagnosis present

## 2024-04-13 DIAGNOSIS — D630 Anemia in neoplastic disease: Secondary | ICD-10-CM | POA: Diagnosis present

## 2024-04-13 DIAGNOSIS — Z751 Person awaiting admission to adequate facility elsewhere: Secondary | ICD-10-CM

## 2024-04-13 DIAGNOSIS — K7460 Unspecified cirrhosis of liver: Secondary | ICD-10-CM | POA: Diagnosis present

## 2024-04-13 DIAGNOSIS — F1120 Opioid dependence, uncomplicated: Secondary | ICD-10-CM | POA: Diagnosis present

## 2024-04-13 DIAGNOSIS — Z7189 Other specified counseling: Secondary | ICD-10-CM

## 2024-04-13 DIAGNOSIS — F1020 Alcohol dependence, uncomplicated: Secondary | ICD-10-CM | POA: Diagnosis present

## 2024-04-13 DIAGNOSIS — E871 Hypo-osmolality and hyponatremia: Secondary | ICD-10-CM | POA: Diagnosis present

## 2024-04-13 DIAGNOSIS — K5903 Drug induced constipation: Secondary | ICD-10-CM | POA: Diagnosis not present

## 2024-04-13 DIAGNOSIS — C7951 Secondary malignant neoplasm of bone: Secondary | ICD-10-CM | POA: Diagnosis present

## 2024-04-13 DIAGNOSIS — F1010 Alcohol abuse, uncomplicated: Secondary | ICD-10-CM | POA: Diagnosis present

## 2024-04-13 DIAGNOSIS — M4804 Spinal stenosis, thoracic region: Secondary | ICD-10-CM | POA: Diagnosis present

## 2024-04-13 DIAGNOSIS — Z510 Encounter for antineoplastic radiation therapy: Secondary | ICD-10-CM

## 2024-04-13 DIAGNOSIS — N390 Urinary tract infection, site not specified: Principal | ICD-10-CM | POA: Diagnosis present

## 2024-04-13 DIAGNOSIS — K219 Gastro-esophageal reflux disease without esophagitis: Secondary | ICD-10-CM | POA: Diagnosis present

## 2024-04-13 DIAGNOSIS — J189 Pneumonia, unspecified organism: Secondary | ICD-10-CM | POA: Diagnosis present

## 2024-04-13 DIAGNOSIS — G893 Neoplasm related pain (acute) (chronic): Secondary | ICD-10-CM | POA: Diagnosis present

## 2024-04-13 DIAGNOSIS — Z66 Do not resuscitate: Secondary | ICD-10-CM | POA: Diagnosis present

## 2024-04-13 DIAGNOSIS — Z923 Personal history of irradiation: Secondary | ICD-10-CM

## 2024-04-13 DIAGNOSIS — R270 Ataxia, unspecified: Secondary | ICD-10-CM | POA: Diagnosis present

## 2024-04-13 DIAGNOSIS — C61 Malignant neoplasm of prostate: Principal | ICD-10-CM | POA: Diagnosis present

## 2024-04-13 DIAGNOSIS — G9341 Metabolic encephalopathy: Secondary | ICD-10-CM | POA: Diagnosis not present

## 2024-04-13 DIAGNOSIS — Z8546 Personal history of malignant neoplasm of prostate: Secondary | ICD-10-CM

## 2024-04-13 DIAGNOSIS — R000 Tachycardia, unspecified: Secondary | ICD-10-CM | POA: Diagnosis present

## 2024-04-13 DIAGNOSIS — T402X5A Adverse effect of other opioids, initial encounter: Secondary | ICD-10-CM | POA: Diagnosis not present

## 2024-04-13 DIAGNOSIS — I10 Essential (primary) hypertension: Secondary | ICD-10-CM | POA: Diagnosis present

## 2024-04-13 LAB — RESPIRATORY PANEL BY PCR

## 2024-04-13 LAB — CBC WITH DIFFERENTIAL/PLATELET
Abs Immature Granulocytes: 0.11 K/uL — ABNORMAL HIGH (ref 0.00–0.07)
Basophils Absolute: 0 K/uL (ref 0.0–0.1)
Basophils Relative: 0 %
Eosinophils Absolute: 0 K/uL (ref 0.0–0.5)
Eosinophils Relative: 0 %
HCT: 31.8 % — ABNORMAL LOW (ref 39.0–52.0)
Hemoglobin: 10.2 g/dL — ABNORMAL LOW (ref 13.0–17.0)
Immature Granulocytes: 1 %
Lymphocytes Relative: 4 %
Lymphs Abs: 0.6 K/uL — ABNORMAL LOW (ref 0.7–4.0)
MCH: 37 pg — ABNORMAL HIGH (ref 26.0–34.0)
MCHC: 32.1 g/dL (ref 30.0–36.0)
MCV: 115.2 fL — ABNORMAL HIGH (ref 80.0–100.0)
Monocytes Absolute: 1.2 K/uL — ABNORMAL HIGH (ref 0.1–1.0)
Monocytes Relative: 8 %
Neutro Abs: 12.7 K/uL — ABNORMAL HIGH (ref 1.7–7.7)
Neutrophils Relative %: 87 %
Platelets: 235 K/uL (ref 150–400)
RBC: 2.76 MIL/uL — ABNORMAL LOW (ref 4.22–5.81)
RDW: 13.4 % (ref 11.5–15.5)
Smear Review: NORMAL
WBC: 14.6 K/uL — ABNORMAL HIGH (ref 4.0–10.5)
nRBC: 0.2 % (ref 0.0–0.2)

## 2024-04-13 LAB — URINALYSIS, ROUTINE W REFLEX MICROSCOPIC
Bilirubin Urine: NEGATIVE
Glucose, UA: NEGATIVE mg/dL
Ketones, ur: 80 mg/dL — AB
Nitrite: POSITIVE — AB
Protein, ur: 30 mg/dL — AB
Specific Gravity, Urine: 1.016 (ref 1.005–1.030)
pH: 5 (ref 5.0–8.0)

## 2024-04-13 LAB — COMPREHENSIVE METABOLIC PANEL WITH GFR
ALT: 43 U/L (ref 0–44)
AST: 76 U/L — ABNORMAL HIGH (ref 15–41)
Albumin: 3.9 g/dL (ref 3.5–5.0)
Alkaline Phosphatase: 226 U/L — ABNORMAL HIGH (ref 38–126)
Anion gap: 20 — ABNORMAL HIGH (ref 5–15)
BUN: 17 mg/dL (ref 8–23)
CO2: 19 mmol/L — ABNORMAL LOW (ref 22–32)
Calcium: 10.5 mg/dL — ABNORMAL HIGH (ref 8.9–10.3)
Chloride: 93 mmol/L — ABNORMAL LOW (ref 98–111)
Creatinine, Ser: 0.74 mg/dL (ref 0.61–1.24)
GFR, Estimated: >60 mL/min (ref >60)
Glucose, Bld: 84 mg/dL (ref 70–99)
Potassium: 4.1 mmol/L (ref 3.5–5.1)
Sodium: 132 mmol/L — ABNORMAL LOW (ref 135–145)
Total Bilirubin: 0.7 mg/dL (ref 0.0–1.2)
Total Protein: 7.1 g/dL (ref 6.5–8.1)

## 2024-04-13 LAB — BLOOD GAS, VENOUS
Acid-base deficit: 5.3 mmol/L — ABNORMAL HIGH (ref 0.0–2.0)
Bicarbonate: 19.1 mmol/L — ABNORMAL LOW (ref 20.0–28.0)
O2 Saturation: 58.5 %
Patient temperature: 37
pCO2, Ven: 33 mmHg — ABNORMAL LOW (ref 44–60)
pH, Ven: 7.37 (ref 7.25–7.43)
pO2, Ven: 36 mmHg (ref 32–45)

## 2024-04-13 LAB — PROCALCITONIN: Procalcitonin: 0.95 ng/mL

## 2024-04-13 MED ORDER — ATORVASTATIN CALCIUM 20 MG PO TABS
20.0000 mg | ORAL_TABLET | Freq: Every day | ORAL | Status: DC
  Administered 2024-04-14 – 2024-04-15 (×2): 20 mg via ORAL
  Filled 2024-04-13: qty 1
  Filled 2024-04-13: qty 2

## 2024-04-13 MED ORDER — METHOCARBAMOL 500 MG PO TABS
500.0000 mg | ORAL_TABLET | Freq: Four times a day (QID) | ORAL | Status: DC | PRN
  Administered 2024-04-14 – 2024-05-03 (×18): 500 mg via ORAL
  Filled 2024-04-13 (×19): qty 1

## 2024-04-13 MED ORDER — SODIUM CHLORIDE 0.9 % IV SOLN
1.0000 g | INTRAVENOUS | Status: DC
  Administered 2024-04-14 – 2024-04-16 (×3): 1 g via INTRAVENOUS
  Filled 2024-04-13 (×4): qty 10

## 2024-04-13 MED ORDER — LORAZEPAM 2 MG/ML IJ SOLN
1.0000 mg | INTRAMUSCULAR | Status: DC | PRN
  Administered 2024-04-14: 1 mg via INTRAVENOUS
  Administered 2024-04-14 (×4): 2 mg via INTRAVENOUS
  Administered 2024-04-15: 1 mg via INTRAVENOUS
  Administered 2024-04-15: 2 mg via INTRAVENOUS
  Administered 2024-04-15: 1 mg via INTRAVENOUS
  Filled 2024-04-13 (×6): qty 1
  Filled 2024-04-13: qty 2
  Filled 2024-04-13: qty 1

## 2024-04-13 MED ORDER — DEXAMETHASONE SOD PHOSPHATE PF 10 MG/ML IJ SOLN
10.0000 mg | Freq: Once | INTRAMUSCULAR | Status: AC
  Administered 2024-04-13: 10 mg via INTRAVENOUS

## 2024-04-13 MED ORDER — ONDANSETRON HCL 4 MG/2ML IJ SOLN
4.0000 mg | Freq: Four times a day (QID) | INTRAMUSCULAR | Status: DC | PRN
  Filled 2024-04-13: qty 2

## 2024-04-13 MED ORDER — POLYETHYLENE GLYCOL 3350 17 G PO PACK
17.0000 g | PACK | Freq: Two times a day (BID) | ORAL | Status: DC
  Administered 2024-04-14 – 2024-05-03 (×18): 17 g via ORAL
  Filled 2024-04-13 (×30): qty 1

## 2024-04-13 MED ORDER — THIAMINE HCL 100 MG/ML IJ SOLN: 100.0000 mg | Freq: Every day | INTRAMUSCULAR | Status: DC

## 2024-04-13 MED ORDER — MORPHINE SULFATE 15 MG PO TABS
15.0000 mg | ORAL_TABLET | ORAL | Status: DC | PRN
  Administered 2024-04-15 – 2024-05-03 (×40): 15 mg via ORAL
  Filled 2024-04-13 (×41): qty 1

## 2024-04-13 MED ORDER — SENNOSIDES-DOCUSATE SODIUM 8.6-50 MG PO TABS
2.0000 | ORAL_TABLET | Freq: Two times a day (BID) | ORAL | Status: DC
  Administered 2024-04-14 – 2024-04-25 (×22): 2 via ORAL
  Filled 2024-04-13 (×25): qty 2

## 2024-04-13 MED ORDER — SODIUM CHLORIDE 0.9 % IV SOLN
1.0000 g | Freq: Once | INTRAVENOUS | Status: AC
  Administered 2024-04-13: 1 g via INTRAVENOUS
  Filled 2024-04-13: qty 10

## 2024-04-13 MED ORDER — FOLIC ACID 1 MG PO TABS
1.0000 mg | ORAL_TABLET | Freq: Every day | ORAL | Status: DC
  Administered 2024-04-13 – 2024-05-04 (×18): 1 mg via ORAL
  Filled 2024-04-13 (×19): qty 1

## 2024-04-13 MED ORDER — ONDANSETRON HCL 4 MG PO TABS: 4.0000 mg | ORAL_TABLET | Freq: Four times a day (QID) | ORAL | Status: DC | PRN

## 2024-04-13 MED ORDER — ADULT MULTIVITAMIN W/MINERALS CH
1.0000 | ORAL_TABLET | Freq: Every day | ORAL | Status: DC
  Administered 2024-04-13 – 2024-05-04 (×17): 1 via ORAL
  Filled 2024-04-13 (×19): qty 1

## 2024-04-13 MED ORDER — THIAMINE MONONITRATE 100 MG PO TABS
100.0000 mg | ORAL_TABLET | Freq: Every day | ORAL | Status: DC
  Administered 2024-04-13 – 2024-05-04 (×18): 100 mg via ORAL
  Filled 2024-04-13 (×19): qty 1

## 2024-04-13 MED ORDER — LORAZEPAM 1 MG PO TABS: 1.0000 mg | ORAL_TABLET | ORAL | Status: DC | PRN

## 2024-04-13 MED ORDER — SODIUM CHLORIDE 0.9 % IV SOLN
500.0000 mg | Freq: Once | INTRAVENOUS | Status: AC
  Administered 2024-04-13: 500 mg via INTRAVENOUS
  Filled 2024-04-13: qty 5

## 2024-04-13 MED ORDER — AMLODIPINE BESYLATE 10 MG PO TABS
10.0000 mg | ORAL_TABLET | Freq: Every day | ORAL | Status: DC
  Administered 2024-04-13 – 2024-05-04 (×22): 10 mg via ORAL
  Filled 2024-04-13 (×14): qty 1
  Filled 2024-04-13: qty 2
  Filled 2024-04-13 (×2): qty 1
  Filled 2024-04-13: qty 2
  Filled 2024-04-13 (×4): qty 1

## 2024-04-13 MED ORDER — MORPHINE SULFATE ER 30 MG PO TBCR
60.0000 mg | EXTENDED_RELEASE_TABLET | Freq: Three times a day (TID) | ORAL | Status: DC
  Administered 2024-04-13 – 2024-05-04 (×60): 60 mg via ORAL
  Filled 2024-04-13: qty 2
  Filled 2024-04-13 (×2): qty 4
  Filled 2024-04-13 (×4): qty 2
  Filled 2024-04-13: qty 4
  Filled 2024-04-13 (×4): qty 2
  Filled 2024-04-13: qty 4
  Filled 2024-04-13 (×4): qty 2
  Filled 2024-04-13: qty 4
  Filled 2024-04-13 (×21): qty 2
  Filled 2024-04-13: qty 4
  Filled 2024-04-13: qty 2
  Filled 2024-04-13: qty 4
  Filled 2024-04-13 (×3): qty 2
  Filled 2024-04-13: qty 4
  Filled 2024-04-13 (×5): qty 2
  Filled 2024-04-13: qty 4
  Filled 2024-04-13 (×3): qty 2
  Filled 2024-04-13: qty 4
  Filled 2024-04-13 (×6): qty 2

## 2024-04-13 MED ORDER — IRBESARTAN 75 MG PO TABS
75.0000 mg | ORAL_TABLET | Freq: Every day | ORAL | Status: DC
  Administered 2024-04-13 – 2024-05-04 (×22): 75 mg via ORAL
  Filled 2024-04-13 (×23): qty 1

## 2024-04-13 MED ORDER — AZITHROMYCIN 250 MG PO TABS
500.0000 mg | ORAL_TABLET | Freq: Every day | ORAL | Status: AC
  Administered 2024-04-15 – 2024-04-17 (×3): 500 mg via ORAL
  Filled 2024-04-13 (×4): qty 2

## 2024-04-13 MED ORDER — IBUPROFEN 800 MG PO TABS
800.0000 mg | ORAL_TABLET | Freq: Three times a day (TID) | ORAL | Status: DC | PRN
  Administered 2024-04-18 – 2024-04-21 (×3): 800 mg via ORAL
  Filled 2024-04-13 (×3): qty 1

## 2024-04-13 MED ORDER — ENOXAPARIN SODIUM 40 MG/0.4ML IJ SOSY
40.0000 mg | PREFILLED_SYRINGE | INTRAMUSCULAR | Status: DC
  Administered 2024-04-14 – 2024-05-04 (×21): 40 mg via SUBCUTANEOUS
  Filled 2024-04-13 (×23): qty 0.4

## 2024-04-13 MED ORDER — DEXAMETHASONE 4 MG PO TABS: 4.0000 mg | ORAL_TABLET | Freq: Two times a day (BID) | ORAL | Status: DC

## 2024-04-13 MED ORDER — SODIUM CHLORIDE 0.9 % IV BOLUS
2000.0000 mL | Freq: Once | INTRAVENOUS | Status: AC
  Administered 2024-04-13: 2000 mL via INTRAVENOUS

## 2024-04-13 MED ORDER — FUROSEMIDE 20 MG PO TABS
20.0000 mg | ORAL_TABLET | Freq: Every day | ORAL | Status: DC
  Administered 2024-04-14 – 2024-05-04 (×21): 20 mg via ORAL
  Filled 2024-04-13 (×21): qty 1

## 2024-04-13 MED ORDER — DEXAMETHASONE SODIUM PHOSPHATE 4 MG/ML IJ SOLN
4.0000 mg | Freq: Four times a day (QID) | INTRAMUSCULAR | Status: DC
  Administered 2024-04-13 – 2024-04-15 (×6): 4 mg via INTRAVENOUS
  Filled 2024-04-13 (×6): qty 1

## 2024-04-13 NOTE — Transitions of Care (Post Inpatient/ED Visit) (Signed)
 04/13/2024  Name: David Townsend MRN: [id number] DOB: Apr 03, 1961  Today's TOC FU Call Status: Today's TOC FU Call Status:: Successful TOC FU Call Completed TOC FU Call Complete Date: 04/13/24 Patient's Name and Date of Birth confirmed.  Transition Care Management Follow-up Telephone Call Date of Discharge: 04/10/24 Discharge Facility: [name]) Type of Discharge: Inpatient Admission Primary Inpatient Discharge Diagnosis:: edema How have you been since you were released from the hospital?: Worse (patient back at the hospital)  Items Reviewed: Did you receive and understand the discharge instructions provided?: Yes Medications obtained,verified, and reconciled?: Yes (Medications Reviewed) Any new allergies since your discharge?: No Dietary orders reviewed?: Yes Do you have support at home?: No  Medications Reviewed Today: Medications Reviewed Today     Reviewed by Emmitt Pan, LPN (Licensed Practical Nurse) on 04/13/24 at 1020  Med List Status: <None>   Medication Order Taking? Sig Documenting Provider Last Dose Status Informant  acetaminophen  (TYLENOL ) 500 MG tablet [id number] Yes Take 500 mg by mouth every 6 (six) hours as needed for moderate pain (pain score 4-6). [provider]  Active Self, Pharmacy Records  amLODipine  (NORVASC ) 10 MG tablet [id number] Yes Take 1 tablet (10 mg total) by mouth daily. Jaycee Greig PARAS, NP  Active Self, Pharmacy Records           Med Note [place], DUROJAHYE' R   Wed Apr 08, 2024  5:21 AM) Patient states that he takes when he can remember to   Ascorbic Acid (VITAMIN C PO) [id number] Yes Take 1 tablet by mouth daily. [provider]  Active Self, Pharmacy Records  atorvastatin  (LIPITOR) 20 MG tablet [id number] Yes Take 1 tablet (20 mg total) by mouth daily. Jaycee Greig PARAS, NP  Active Self, Pharmacy Records  cyclobenzaprine (FLEXERIL) 10 MG tablet [id number] Yes Take 1 tablet (10 mg total) by mouth every 8 (eight) hours as  needed for muscle spasms. Veta Palma, PA-C  Active Self, Pharmacy Records  dexamethasone  (DECADRON ) 4 MG tablet [id number] Yes Take 1 tablet (4 mg total) by mouth every 12 (twelve) hours. [place], Eric J, DO  Active   ERLEADA  60 MG tablet [id number] Yes Take 120 mg by mouth daily. [provider]  Active Self, Pharmacy Records  furosemide  (LASIX ) 20 MG tablet [id number] Yes Take 1 tablet (20 mg total) by mouth daily. [place], Camellia PARAS, DO  Active   ibuprofen  (ADVIL ) 800 MG tablet [id number] Yes Take 1 tablet (800 mg total) by mouth every 8 (eight) hours as needed for fever, mild pain or headache (alternate with tylenol  for fever). Jillian Buttery, MD  Active Self, Pharmacy Records  Iron , Ferrous Sulfate , 325 (65 Fe) MG TABS [id number] Yes Take 325 mg by mouth daily. Jaycee Greig PARAS, NP  Active Self, Pharmacy Records  lidocaine  (LIDODERM ) 5 % [id number] Yes Place 1 patch onto the skin daily. Remove & Discard patch within 12 hours or as directed by MD Veta Palma, PA-C  Active Self, Pharmacy Records  methocarbamol (ROBAXIN) 500 MG tablet [id number] Yes Take 1 tablet (500 mg total) by mouth every 6 (six) hours as needed for muscle spasms. [place], Camellia PARAS, DO  Active   morphine  (MS CONTIN ) 60 MG 12 hr tablet [id number] Yes Take 1 tablet (60 mg total) by mouth every 8 (eight) hours. [place], Camellia PARAS, DO  Active   morphine  (MSIR) 30 MG tablet [id number] Yes Take 1 tablet (30 mg total) by mouth every 4 (four) hours as needed for severe pain (pain score 7-10) or  moderate pain (pain score 4-6). [place], Camellia PARAS, DO  Active   Multiple Vitamin (MULTIVITAMIN WITH MINERALS) TABS tablet [id number] Yes Take 1 tablet by mouth daily. Jillian Buttery, MD  Active Self, Pharmacy Records  polyethylene glycol powder [facility]) 17 GM/SCOOP powder [id number] Yes Take 17 g by mouth 2 (two) times daily. Dissolve 1 capful (17g) in 4-8 ounces of liquid and take by mouth daily. [place], Camellia PARAS, DO  Active    senna-docusate (SENOKOT-S) 8.6-50 MG tablet [id number] Yes Take 2 tablets by mouth 2 (two) times daily. [place], Camellia PARAS, DO  Active   valsartan  (DIOVAN ) 40 MG tablet [id number] Yes Take 1 tablet (40 mg total) by mouth daily. Jaycee Greig PARAS, NP  Active Self, Pharmacy Records           Med Note [place], DUROJAHYE' R   Wed Apr 08, 2024  5:21 AM) Patient states that he takes when he can remember to  VITAMIN D  PO [id number] Yes Take 1 capsule by mouth daily. [provider]  Active Self, Pharmacy Records            Home Care and Equipment/Supplies: Were Home Health Services Ordered?: NA Any new equipment or medical supplies ordered?: NA  Functional Questionnaire: Do you need assistance with bathing/showering or dressing?: Yes Do you need assistance with meal preparation?: Yes Do you need assistance with eating?: No Do you have difficulty maintaining continence: No Do you need assistance with getting out of bed/getting out of a chair/moving?: No Do you have difficulty managing or taking your medications?: No  Follow up appointments reviewed: PCP Follow-up appointment confirmed?: No (patient back at hospital) MD Provider Line [phone] Given: No Specialist Hospital Follow-up appointment confirmed?: NA Do you need transportation to your follow-up appointment?: No Do you understand care options if your condition(s) worsen?: Yes-patient verbalized understanding    SIGNATURE Julian Lemmings, LPN [facility] Nurse Health Advisor Direct Dial [phone]

## 2024-04-13 NOTE — Consult Note (Signed)
 Consultation Note Date: 04/13/2024   Patient Name: David Townsend  DOB: 07-26-1960  MRN: [id number]  Age / Sex: 63 y.o., male  PCP: Jaycee Greig PARAS, NP Referring Physician: Laurita Cort DASEN, MD  Reason for Consultation: Establishing goals of care, Non pain symptom management, and Pain control  HPI/Patient Profile: 63 y.o. 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 Palliative consult continues Chart reviewed Patient seen and examined Corresponded with [facility] MD Palliative medicine is specialized medical care for people living with serious illness. It focuses on providing relief from the symptoms and stress of a serious illness. The goal is to improve quality of life for both the patient and the family. Goals of care: Broad aims of medical therapy in relation to the patient's values and preferences. Our aim is to provide medical care aimed at enabling patients to achieve the goals that matter most to them, given the circumstances of their particular medical situation and their constraints.  Patient's significant other called into the room as soon as the patient arrived upstairs to oncology floor from the emergency department.  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 Agree with current opioid regimen, continue to monitor Monitor hospital course and continue CODE STATUS and broad goals of care discussions.  Palliative to follow closely. Thank you for the consult.   Code Status/Advance Care Planning: Full code   Symptom Management:     Palliative Prophylaxis:  Frequent Pain Assessment  Additional Recommendations (Limitations, Scope, Preferences): Full Scope Treatment  Psycho-social/Spiritual:  Desire for further Chaplaincy support:yes Additional Recommendations: Caregiving  Support/Resources  Prognosis:  Unable to determine  Discharge Planning: To Be Determined      Primary Diagnoses: Present on Admission: **None**   I have reviewed the medical record, interviewed the patient and family, and examined the  patient. The following aspects are pertinent.  Past Medical History:  Diagnosis Date   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   Essential hypertension 04/23/2022   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   Hyperlipidemia 03/27/2022   Penile lesion    Prostate cancer metastatic to bone (HCC) 04/23/2022   Urinary retention 04/23/2022   Social History   Socioeconomic History   Marital status: Single    Spouse name: n/a   Number of children: 0   Years of  education: 12+   Highest education level: Not on file  Occupational History   Occupation: golf guy at [employer]  Tobacco Use   Smoking status: Every Day    Current packs/day: 2.00    Average packs/day: 2.0 packs/day for 34.0 years (68.0 ttl pk-yrs)    Types: Cigarettes    Passive exposure: Current   Smokeless tobacco: Never  Vaping Use   Vaping status: Never Used  Substance and Sexual Activity   Alcohol use: Yes    Alcohol/week: 7.0 standard drinks of alcohol    Types: 4 Cans of beer, 3 Shots of liquor per week    Comment: 5-6 beers per day   Drug use: No   Sexual activity: Not on file  Other Topics Concern   Not on file  Social History Narrative   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   Lives alone.   Golf Pro   Social Drivers of Health   Financial Resource Strain: Not on file  Food Insecurity: No Food Insecurity (04/08/2024)   Hunger Vital Sign    Worried About Running Out of Food in the Last Year: Never true    Ran Out of Food in the Last Year: Never true  Transportation Needs: No Transportation Needs (04/08/2024)   PRAPARE - Administrator, Civil Service (Medical): No    Lack of Transportation (Non-Medical): No  Physical Activity: Not on file  Stress: Not on file  Social Connections: Not on file   Family History  Family history unknown: Yes   Scheduled Meds:  amLODipine   10 mg Oral Daily   [START ON 04/14/2024] atorvastatin   20 mg Oral Daily   [START ON 04/14/2024] azithromycin  500 mg Oral Daily   dexamethasone  (DECADRON ) injection  4 mg Intravenous Q6H   And   dexamethasone  (DECADRON ) injection  10 mg Intravenous Once   [START ON 04/14/2024] enoxaparin  (LOVENOX ) injection  40 mg Subcutaneous Q24H   folic acid   1 mg Oral Daily   [START ON 04/14/2024] furosemide   20 mg Oral Daily   irbesartan   75 mg Oral Daily   morphine   60 mg Oral Q8H   multivitamin with minerals  1 tablet Oral Daily   polyethylene glycol  17 g Oral  BID   senna-docusate  2 tablet Oral BID   thiamine   100 mg Oral Daily   Or   thiamine   100 mg Intravenous Daily   Continuous Infusions:  [START ON 04/14/2024] cefTRIAXone  (ROCEPHIN )  IV     PRN Meds:.ibuprofen , LORazepam  **OR** LORazepam , methocarbamol, morphine , ondansetron  **OR** ondansetron  (ZOFRAN ) IV Medications Prior to Admission:  Prior to Admission medications   Medication Sig Start Date End Date Taking? Authorizing Provider  acetaminophen  (TYLENOL ) 500 MG tablet Take 500 mg by mouth every 6 (six) hours as needed for moderate pain (pain score 4-6).    [provider]  amLODipine  (NORVASC ) 10 MG tablet Take 1 tablet (10 mg total) by mouth daily.  11/27/23   Jaycee Greig PARAS, NP  Ascorbic Acid (VITAMIN C PO) Take 1 tablet by mouth daily.    [provider]  atorvastatin  (LIPITOR) 20 MG tablet Take 1 tablet (20 mg total) by mouth daily. 12/04/23   Jaycee Greig PARAS, NP  cyclobenzaprine (FLEXERIL) 10 MG tablet Take 1 tablet (10 mg total) by mouth every 8 (eight) hours as needed for muscle spasms. 03/27/24   Veta Palma, PA-C  dexamethasone  (DECADRON ) 4 MG tablet Take 1 tablet (4 mg total) by mouth every 12 (twelve) hours. 04/10/24 05/10/24  [place], Camellia PARAS, DO  ERLEADA  60 MG tablet Take 120 mg by mouth daily. 02/20/22   [provider]  furosemide  (LASIX ) 20 MG tablet Take 1 tablet (20 mg total) by mouth daily. 04/10/24 05/10/24  [place], Camellia PARAS, DO  ibuprofen  (ADVIL ) 800 MG tablet Take 1 tablet (800 mg total) by mouth every 8 (eight) hours as needed for fever, mild pain or headache (alternate with tylenol  for fever). 10/03/22   Jillian Buttery, MD  Iron , Ferrous Sulfate , 325 (65 Fe) MG TABS Take 325 mg by mouth daily. 11/27/23   Jaycee Greig PARAS, NP  lidocaine  (LIDODERM ) 5 % Place 1 patch onto the skin daily. Remove & Discard patch within 12 hours or as directed by MD 03/27/24   Veta Palma, PA-C  methocarbamol (ROBAXIN) 500 MG tablet Take 1 tablet (500 mg total) by  mouth every 6 (six) hours as needed for muscle spasms. 04/10/24   [place], Camellia PARAS, DO  morphine  (MS CONTIN ) 60 MG 12 hr tablet Take 1 tablet (60 mg total) by mouth every 8 (eight) hours. 04/10/24 05/10/24  [place], Camellia PARAS, DO  morphine  (MSIR) 30 MG tablet Take 1 tablet (30 mg total) by mouth every 4 (four) hours as needed for severe pain (pain score 7-10) or moderate pain (pain score 4-6). 04/10/24   [place], Camellia PARAS, DO  Multiple Vitamin (MULTIVITAMIN WITH MINERALS) TABS tablet Take 1 tablet by mouth daily. 10/04/22   Jillian Buttery, MD  polyethylene glycol powder (GLYCOLAX/MIRALAX) 17 GM/SCOOP powder Take 17 g by mouth 2 (two) times daily. Dissolve 1 capful (17g) in 4-8 ounces of liquid and take by mouth daily. 04/10/24   [place], Eric J, DO  senna-docusate (SENOKOT-S) 8.6-50 MG tablet Take 2 tablets by mouth 2 (two) times daily. 04/10/24   [place], Camellia PARAS, DO  valsartan  (DIOVAN ) 40 MG tablet Take 1 tablet (40 mg total) by mouth daily. 12/04/23   Jaycee Greig PARAS, NP  VITAMIN D  PO Take 1 capsule by mouth daily.    [provider]   No Known Allergies Review of Systems Generalized pain, worsening back pain Physical Exam Patient able to freely move lower extremities strength 5/5 Sensation intact Awake alert oriented answers questions appropriately Regular work of breathing  Vital Signs: BP (!) 151/87 (BP Location: Left Arm)   Pulse (!) 101   Temp 98.2 F (36.8 C) (Oral)   Resp 16   SpO2 96%          SpO2: SpO2: 96 % O2 Device:SpO2: 96 % O2 Flow Rate: .   IO: Intake/output summary:  Intake/Output Summary (Last 24 hours) at 04/13/2024 1602 Last data filed at 04/13/2024 1423 Gross per 24 hour  Intake 100 ml  Output --  Net 100 ml    LBM:   Baseline Weight:   Most recent weight:       Palliative Assessment/Data:  Palliative performance scale 50%   Time In: 1500 Time  Out: 1615 Time Total: 75 Greater than 50%  of this time was spent counseling and coordinating care related  to the above assessment and plan.  Signed by: Lonia Serve, MD   Please contact Palliative Medicine Team phone at [phone] for questions and concerns.  For individual provider: See Tracey

## 2024-04-13 NOTE — Plan of Care (Signed)

## 2024-04-13 NOTE — Plan of Care (Signed)
  Problem: Coping: Goal: Level of anxiety will decrease Outcome: Progressing   Problem: Pain Managment: Goal: General experience of comfort will improve and/or be controlled Outcome: Progressing   Problem: Safety: Goal: Ability to remain free from injury will improve Outcome: Progressing   Problem: Skin Integrity: Goal: Risk for impaired skin integrity will decrease Outcome: Progressing

## 2024-04-13 NOTE — ED Provider Notes (Signed)
 [place] EMERGENCY DEPARTMENT AT [facility] Provider Note   CSN: [id number] Arrival date & time: 04/13/24  1009     Patient presents with: Weakness   David Townsend is a 63 y.o. male.   Patient reports he came to the emergency department after having a fall at home.  He states that he fell yesterday and then fell again today.  Patient reports he had help getting up yesterday he was able to get up on his own today.  I spoke with patient's girlfriend.  She states that the patient has not been doing well at home.  He is not eating or drinking like he should.  She states that he is falling.  He has had increasing confusion.  Patient has a past medical history of metastatic prostate cancer.  Patient has metastasis to his spine and has had recent fractures.  Patient is on pain medication at home for fractures.  Patient's girlfriend states she does not live with him.  She is unsure if he is taking the medications correctly.  The history is provided by the patient and a caregiver. No language interpreter was used.  Weakness      Prior to Admission medications   Medication Sig Start Date End Date Taking? Authorizing Provider  acetaminophen  (TYLENOL ) 500 MG tablet Take 500 mg by mouth every 6 (six) hours as needed for moderate pain (pain score 4-6).    [provider]  amLODipine  (NORVASC ) 10 MG tablet Take 1 tablet (10 mg total) by mouth daily. 11/27/23   Jaycee Greig PARAS, NP  Ascorbic Acid (VITAMIN C PO) Take 1 tablet by mouth daily.    [provider]  atorvastatin  (LIPITOR) 20 MG tablet Take 1 tablet (20 mg total) by mouth daily. 12/04/23   Jaycee Greig PARAS, NP  cyclobenzaprine (FLEXERIL) 10 MG tablet Take 1 tablet (10 mg total) by mouth every 8 (eight) hours as needed for muscle spasms. 03/27/24   Veta Palma, PA-C  dexamethasone  (DECADRON ) 4 MG tablet Take 1 tablet (4 mg total) by mouth every 12 (twelve) hours. 04/10/24 05/10/24  [place], Camellia PARAS, DO  ERLEADA  60 MG  tablet Take 120 mg by mouth daily. 02/20/22   [provider]  furosemide  (LASIX ) 20 MG tablet Take 1 tablet (20 mg total) by mouth daily. 04/10/24 05/10/24  [place], Eric J, DO  ibuprofen  (ADVIL ) 800 MG tablet Take 1 tablet (800 mg total) by mouth every 8 (eight) hours as needed for fever, mild pain or headache (alternate with tylenol  for fever). 10/03/22   Jillian Buttery, MD  Iron , Ferrous Sulfate , 325 (65 Fe) MG TABS Take 325 mg by mouth daily. 11/27/23   Jaycee Greig PARAS, NP  lidocaine  (LIDODERM ) 5 % Place 1 patch onto the skin daily. Remove & Discard patch within 12 hours or as directed by MD 03/27/24   Veta Palma, PA-C  methocarbamol (ROBAXIN) 500 MG tablet Take 1 tablet (500 mg total) by mouth every 6 (six) hours as needed for muscle spasms. 04/10/24   [place], Camellia PARAS, DO  morphine  (MS CONTIN ) 60 MG 12 hr tablet Take 1 tablet (60 mg total) by mouth every 8 (eight) hours. 04/10/24 05/10/24  [place], Camellia PARAS, DO  morphine  (MSIR) 30 MG tablet Take 1 tablet (30 mg total) by mouth every 4 (four) hours as needed for severe pain (pain score 7-10) or moderate pain (pain score 4-6). 04/10/24   [place], Camellia PARAS, DO  Multiple Vitamin (MULTIVITAMIN WITH MINERALS) TABS tablet Take 1  tablet by mouth daily. 10/04/22   Jillian Buttery, MD  polyethylene glycol powder (GLYCOLAX/MIRALAX) 17 GM/SCOOP powder Take 17 g by mouth 2 (two) times daily. Dissolve 1 capful (17g) in 4-8 ounces of liquid and take by mouth daily. 04/10/24   [place], Eric J, DO  senna-docusate (SENOKOT-S) 8.6-50 MG tablet Take 2 tablets by mouth 2 (two) times daily. 04/10/24   [place], Camellia PARAS, DO  valsartan  (DIOVAN ) 40 MG tablet Take 1 tablet (40 mg total) by mouth daily. 12/04/23   Jaycee Greig PARAS, NP  VITAMIN D  PO Take 1 capsule by mouth daily.    [provider]    Allergies: Patient has no known allergies.    Review of Systems  Neurological:  Positive for weakness.  All other systems reviewed and are negative.   Updated Vital  Signs BP (!) 151/87 (BP Location: Left Arm)   Pulse (!) 106   Temp 99 F (37.2 C) (Oral)   Resp 20   SpO2 96%   Physical Exam Vitals and nursing note reviewed.  Constitutional:      Appearance: He is well-developed.  HENT:     Head: Normocephalic.     Nose: Nose normal.     Mouth/Throat:     Mouth: Mucous membranes are moist.  Cardiovascular:     Rate and Rhythm: Normal rate.  Pulmonary:     Effort: Pulmonary effort is normal.  Abdominal:     General: Abdomen is flat. There is no distension.  Musculoskeletal:        General: Normal range of motion.     Cervical back: Normal range of motion.  Skin:    General: Skin is warm.  Neurological:     General: No focal deficit present.     Mental Status: He is alert and oriented to person, place, and time.  Psychiatric:        Mood and Affect: Mood normal.     (all labs ordered are listed, but only abnormal results are displayed) Labs Reviewed  CBC WITH DIFFERENTIAL/PLATELET - Abnormal; Notable for the following components:      Result Value   WBC 14.6 (*)    RBC 2.76 (*)    Hemoglobin 10.2 (*)    HCT 31.8 (*)    MCV 115.2 (*)    MCH 37.0 (*)    Neutro Abs 12.7 (*)    Lymphs Abs 0.6 (*)    Monocytes Absolute 1.2 (*)    Abs Immature Granulocytes 0.11 (*)    All other components within normal limits  COMPREHENSIVE METABOLIC PANEL WITH GFR - Abnormal; Notable for the following components:   Sodium 132 (*)    Chloride 93 (*)    CO2 19 (*)    Calcium  10.5 (*)    AST 76 (*)    Alkaline Phosphatase 226 (*)    Anion gap 20 (*)    All other components within normal limits  URINALYSIS, ROUTINE W REFLEX MICROSCOPIC - Abnormal; Notable for the following components:   APPearance HAZY (*)    Hgb urine dipstick MODERATE (*)    Ketones, ur 80 (*)    Protein, ur 30 (*)    Nitrite POSITIVE (*)    Leukocytes,Ua TRACE (*)    Bacteria, UA MANY (*)    All other components within normal limits  CULTURE, BLOOD (ROUTINE X 2)   CULTURE, BLOOD (ROUTINE X 2)  URINE CULTURE    EKG: None  Radiology: [facility] Chest Port 1 View Result  Date: 04/13/2024 CLINICAL DATA:  Shortness of breath, chest pain and back pain. Fall. EXAM: PORTABLE CHEST 1 VIEW COMPARISON:  04/10/2024 and CT chest 11/27/2023. FINDINGS: Trachea is midline. Heart size within normal limits. Diffuse hazy airspace opacification, somewhat upper and midlung zone predominant and progressive from 04/10/2024. No pleural effusions. IMPRESSION: Increasing hazy upper and midlung zone predominant airspace opacification may be due to a viral or atypical pneumonia. Electronically Signed   By: Newell Eke M.D.   On: 04/13/2024 12:53     Procedures   Medications Ordered in the ED - No data to display                                  Medical Decision Making Patient reports he has fallen twice at home in the last 2 days.  Patient complains of increasing weakness.  Patient's girlfriend [name] reports that patient has been confused, not eating or drinking and falling.  Patient was discharged from the hospital on 11/7.  He is not on home O2  Amount and/or Complexity of Data Reviewed Independent Historian: caregiver    Details: Pt's girlfriend [facility] provide histroy  Labs: ordered.    Details: Labs ordered reviewed and interpreted wbc's 14.6  Ua shows leukocytes, nitrates many bacteria  Radiology: ordered.    Details: Chest xray  increasing hazy air space disease, possible pneumonia Discussion of management or test interpretation with external provider(s): Hospitalist consulted for admission   Risk Risk Details: IV fluids ordered.  Patient is given Zithromax and Rocephin  for possible pneumonia.  Rocephin  should also cover patient for possible urinary tract infection.  Blood cultures and urine culture ordered.        Final diagnoses:  Urinary tract infection without hematuria, site unspecified  Pneumonia due to infectious organism, unspecified laterality,  unspecified part of lung  Weakness  Falls  Prostate cancer [facility])  Metastatic carcinoma to bone [facility])    ED Discharge Orders     None          Flint Sonny POUR, [place] 04/13/24 1344    Ula Prentice SAUNDERS, MD 04/13/24 1443

## 2024-04-13 NOTE — H&P (Signed)
 History and Physical    David Townsend [name] DOB: 09-Apr-1961 DOA: 04/13/2024  PCP: Jaycee Greig PARAS, NP (Confirm with patient/family/NH records and if not entered, this has to be entered at [facility] point of entry) Patient coming from: Home  I have personally briefly reviewed patient's old medical records in [facility]  Chief Complaint: Feeling weak, frequent falls, feeling woozy  HPI: David Townsend is a 63 y.o. 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  Patient was recently hospitalized for uncontrolled cancer pain, pain management/palliative care was consulted, his pain regimen was adjusted on discharge.  He started to take morphine  IR 30 mg every 3-4 hours for severe pain and morphine  ER 60 mg twice daily.  He was discharged home on Friday.  Since Saturday, patient started develop generalized weakness, especially bilateral lower extremities, and sustained several falls at home.  Gradually getting worse and today he could not stand on his feet due to severe weakness.  He denied any nauseous vomiting abdominal pain.  He does have a chronic dry cough which he attributed to cigarette smoking but denied any fever chills no chest pains.  He denied any dysuria or urinary frequency, no diarrhea.  ED Course: Temperature 99.0, borderline tachycardia 106 blood pressure 151/87 O2 saturation 93%.  Chest x-ray shows increasing interstitial changes implying viral or typical pneumonia.  Blood work showed WBC 14.6 hemoglobin 10.2 BUN 12 creatinine 0.7.  UA 2+ WBC 1+ RBC positive nitrite.  Patient was given ceftriaxone  and azithromycin in the ED.  Review of Systems: As per HPI otherwise 14 point review of systems negative.    Past  Medical History:  Diagnosis Date   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   Essential hypertension 04/23/2022   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   Hyperlipidemia 03/27/2022   Penile lesion    Prostate cancer metastatic to bone (HCC) 04/23/2022   Urinary retention 04/23/2022    Past Surgical History:  Procedure Laterality Date   NO PAST SURGERIES     PENILE BIOPSY N/A 01/12/2020   Procedure: EXCISION OF PENILE LESION;  Surgeon: Elisabeth Valli BIRCH, MD;  Location: [facility];  Service: Urology;  Laterality: N/A;     reports that he has been smoking cigarettes. He has a 68 pack-year smoking history. He has been exposed to tobacco smoke. He has never used smokeless tobacco. He reports current alcohol use of about 7.0 standard drinks of alcohol per week. He reports that he does not use drugs.  No Known Allergies  Family History  Family history unknown: Yes     Prior to Admission medications   Medication Sig Start Date End Date Taking? Authorizing Provider  acetaminophen  (TYLENOL ) 500 MG tablet Take 500 mg by mouth every 6 (six) hours as needed for moderate pain (pain score 4-6).    [provider]  amLODipine  (NORVASC ) 10 MG tablet Take 1 tablet (10 mg total) by mouth daily. 11/27/23   Jaycee Greig PARAS, NP  Ascorbic Acid (VITAMIN C PO) Take 1 tablet by mouth daily.    [provider]  atorvastatin  (LIPITOR) 20 MG tablet Take 1 tablet (20 mg total) by mouth daily. 12/04/23   Jaycee Greig PARAS, NP  cyclobenzaprine (FLEXERIL) 10 MG tablet Take 1 tablet (10 mg total) by mouth every  8 (eight) hours as needed for muscle spasms. 03/27/24   Franaszek, Amanda, PA-C  dexamethasone  (DECADRON ) 4 MG tablet Take 1 tablet (4 mg total) by mouth every 12 (twelve) hours. 04/10/24 05/10/24  [place], Eric J, DO  ERLEADA  60 MG tablet Take 120 mg by mouth daily. 02/20/22   [provider]  furosemide  (LASIX ) 20 MG tablet Take 1 tablet (20 mg total) by mouth daily. 04/10/24  05/10/24  [place], Eric J, DO  ibuprofen  (ADVIL ) 800 MG tablet Take 1 tablet (800 mg total) by mouth every 8 (eight) hours as needed for fever, mild pain or headache (alternate with tylenol  for fever). 10/03/22   Jillian Buttery, MD  Iron , Ferrous Sulfate , 325 (65 Fe) MG TABS Take 325 mg by mouth daily. 11/27/23   Jaycee Greig PARAS, NP  lidocaine  (LIDODERM ) 5 % Place 1 patch onto the skin daily. Remove & Discard patch within 12 hours or as directed by MD 03/27/24   Veta Palma, PA-C  methocarbamol (ROBAXIN) 500 MG tablet Take 1 tablet (500 mg total) by mouth every 6 (six) hours as needed for muscle spasms. 04/10/24   [place], Camellia PARAS, DO  morphine  (MS CONTIN ) 60 MG 12 hr tablet Take 1 tablet (60 mg total) by mouth every 8 (eight) hours. 04/10/24 05/10/24  [place], Camellia PARAS, DO  morphine  (MSIR) 30 MG tablet Take 1 tablet (30 mg total) by mouth every 4 (four) hours as needed for severe pain (pain score 7-10) or moderate pain (pain score 4-6). 04/10/24   [place], Camellia PARAS, DO  Multiple Vitamin (MULTIVITAMIN WITH MINERALS) TABS tablet Take 1 tablet by mouth daily. 10/04/22   Jillian Buttery, MD  polyethylene glycol powder (GLYCOLAX/MIRALAX) 17 GM/SCOOP powder Take 17 g by mouth 2 (two) times daily. Dissolve 1 capful (17g) in 4-8 ounces of liquid and take by mouth daily. 04/10/24   [place], Eric J, DO  senna-docusate (SENOKOT-S) 8.6-50 MG tablet Take 2 tablets by mouth 2 (two) times daily. 04/10/24   [place], Camellia PARAS, DO  valsartan  (DIOVAN ) 40 MG tablet Take 1 tablet (40 mg total) by mouth daily. 12/04/23   Jaycee Greig PARAS, NP  VITAMIN D  PO Take 1 capsule by mouth daily.    [provider]    Physical Exam: Vitals:   04/13/24 1033  BP: (!) 151/87  Pulse: (!) 106  Resp: 20  Temp: 99 F (37.2 C)  TempSrc: Oral  SpO2: 96%    Constitutional: NAD, calm, comfortable Vitals:   04/13/24 1033  BP: (!) 151/87  Pulse: (!) 106  Resp: 20  Temp: 99 F (37.2 C)  TempSrc: Oral  SpO2: 96%   Eyes: PERRL, lids and  conjunctivae normal ENMT: Mucous membranes are moist. Posterior pharynx clear of any exudate or lesions.Normal dentition.  Neck: normal, supple, no masses, no thyromegaly Respiratory: clear to auscultation bilaterally, no wheezing, no crackles. Normal respiratory effort. No accessory muscle use.  Cardiovascular: Regular rate and rhythm, no murmurs / rubs / gallops. No extremity edema. 2+ pedal pulses. No carotid bruits.  Abdomen: no tenderness, no masses palpated. No hepatosplenomegaly. Bowel sounds positive.  Musculoskeletal: no clubbing / cyanosis. No joint deformity upper and lower extremities. Good ROM, no contractures. Normal muscle tone.  Skin: no rashes, lesions, ulcers. No induration Neurologic: CN 2-12 grossly intact. Sensation intact, DTR normal. Strength 5/5 in all 4.  Psychiatric: Normal judgment and insight. Alert and oriented x 3. Normal mood.     Labs on Admission: I have personally reviewed following labs and  imaging studies  CBC: Recent Labs  Lab 04/07/24 1841 04/08/24 0557 04/13/24 1217  WBC 9.5 8.6 14.6*  NEUTROABS 6.6  --  12.7*  HGB 11.5* 11.3* 10.2*  HCT 35.2* 33.2* 31.8*  MCV 114.3* 114.9* 115.2*  PLT 185 171 235   Basic Metabolic Panel: Recent Labs  Lab 04/07/24 1841 04/08/24 0557 04/09/24 0622 04/10/24 0538 04/13/24 1217  NA 131* 134* 135 135 132*  K 3.9 4.0 4.5 4.5 4.1  CL 96* 101 99 99 93*  CO2 21* 23 25 22  19*  GLUCOSE 92 98 98 116* 84  BUN 7* 7* 6* 7* 17  CREATININE 0.80 0.71 0.68 0.67 0.74  CALCIUM  10.0 9.4 10.1 9.9 10.5*  MG  --  1.6* 1.9 2.0  --   PHOS  --  3.2  --  4.1  --    GFR: Estimated Creatinine Clearance: 90.9 mL/min (by C-G formula based on SCr of 0.74 mg/dL). Liver Function Tests: Recent Labs  Lab 04/07/24 1841 04/13/24 1217  AST 41 76*  ALT 18 43  ALKPHOS 257* 226*  BILITOT 0.5 0.7  PROT 6.7 7.1  ALBUMIN 4.2 3.9   No results for input(s): LIPASE, AMYLASE in the last 168 hours. No results for input(s):  AMMONIA in the last 168 hours. Coagulation Profile: No results for input(s): INR, PROTIME in the last 168 hours. Cardiac Enzymes: No results for input(s): CKTOTAL, CKMB, CKMBINDEX, TROPONINI in the last 168 hours. BNP (last 3 results) No results for input(s): PROBNP in the last 8760 hours. HbA1C: No results for input(s): HGBA1C in the last 72 hours. CBG: No results for input(s): GLUCAP in the last 168 hours. Lipid Profile: No results for input(s): CHOL, HDL, LDLCALC, TRIG, CHOLHDL, LDLDIRECT in the last 72 hours. Thyroid  Function Tests: No results for input(s): TSH, T4TOTAL, FREET4, T3FREE, THYROIDAB in the last 72 hours. Anemia Panel: No results for input(s): VITAMINB12, FOLATE, FERRITIN, TIBC, IRON , RETICCTPCT in the last 72 hours. Urine analysis:    Component Value Date/Time   COLORURINE YELLOW 04/13/2024 1218   APPEARANCEUR HAZY (A) 04/13/2024 1218   LABSPEC 1.016 04/13/2024 1218   PHURINE 5.0 04/13/2024 1218   GLUCOSEU NEGATIVE 04/13/2024 1218   HGBUR MODERATE (A) 04/13/2024 1218   BILIRUBINUR NEGATIVE 04/13/2024 1218   BILIRUBINUR negative 08/12/2019 1130   KETONESUR 80 (A) 04/13/2024 1218   PROTEINUR 30 (A) 04/13/2024 1218   UROBILINOGEN 0.2 08/12/2019 1130   NITRITE POSITIVE (A) 04/13/2024 1218   LEUKOCYTESUR TRACE (A) 04/13/2024 1218    Radiological Exams on Admission: DG Chest Port 1 View Result Date: 04/13/2024 CLINICAL DATA:  Shortness of breath, chest pain and back pain. Fall. EXAM: PORTABLE CHEST 1 VIEW COMPARISON:  04/10/2024 and CT chest 11/27/2023. FINDINGS: Trachea is midline. Heart size within normal limits. Diffuse hazy airspace opacification, somewhat upper and midlung zone predominant and progressive from 04/10/2024. No pleural effusions. IMPRESSION: Increasing hazy upper and midlung zone predominant airspace opacification may be due to a viral or atypical pneumonia. Electronically Signed   By: Newell Eke M.D.   On: 04/13/2024 12:53    EKG: None  Assessment/Plan Principal Problem:   Frequent falls  (please populate well all problems here in Problem List. (For example, if patient is on BP meds at home and you resume or decide to hold them, it is a problem that needs to be her. Same for CAD, COPD, HLD and so on)  Frequent falls General Weakness Possible acute ataxia - Multifactorial probably, clinically suspect polypharmacy, especially  concerned about high-dose of narcotic interaction with other CNS medications. - Patient however reported that despite the high dose of narcotic his back pain still not controlled.  I recommended continue long-acting morphine  ER 60 mg every 8 hours, cut down morphine  IR from 30 mg to 15 mg every 4 hours as needed.  Reconsult palliative care to help manage narcotics. - PT evaluation - Other Dx, since patient also has concurrent leukocytosis and signs of possible pneumonia and UTI, will double cover patient with ceftriaxone  and azithromycin.  CAP, atypical - Patient has a cough and has a leukocytosis, given the finding of x-ray of new infiltrates compared to x-ray from last week, we will continue him on ceftriaxone  and azithromycin.  Send atypical pneumonia study including mycoplasma and Legionella and respiratory viral panel.  Check procalcitonin level.  Metastatic prostate cancer - Continue Erleada  - Communicated with radiation oncology regarding possible radiation therapy to the T-spine mentioned on last admission.  HTN - Continue amlodipine  valsartan  Lasix   Alcohol abuse - No symptoms or signs of alcohol withdrawal. - CIWA protocol with as needed benzos  DVT prophylaxis: Lovenox  Code Status: Full code Family Communication: None at bedside Disposition Plan: Patient is sick with frequent falls and generalized weakness and possible ataxia, complicated with high-dose of narcotic on board to address severe cancer metastatic bone lesion associated  pain, requiring multidiscipline management, expect more than 2 midnight hospital stay. Consults called: Palliative care, radiation oncology Admission status: Tele admit   Cort ONEIDA Mana MD [facility] Pager [phone]  04/13/2024, 2:38 PM

## 2024-04-13 NOTE — ED Triage Notes (Addendum)
 BIB EMS from home - back pain/CP  and weakness. Had a fall at some point last night. Neighbor found him this morning on the floor. Has prostate CA with mets to the bone. Seen on 11/4.  O2 86% on RA. On 3L [place] now. [id number]

## 2024-04-14 ENCOUNTER — Ambulatory Visit

## 2024-04-14 ENCOUNTER — Inpatient Hospital Stay (HOSPITAL_COMMUNITY)

## 2024-04-14 DIAGNOSIS — R296 Repeated falls: Secondary | ICD-10-CM | POA: Diagnosis not present

## 2024-04-14 LAB — URINE CULTURE: Special Requests: NORMAL

## 2024-04-14 LAB — CBC
HCT: 30.9 % — ABNORMAL LOW (ref 39.0–52.0)
Hemoglobin: 10.9 g/dL — ABNORMAL LOW (ref 13.0–17.0)
MCH: 40.4 pg — ABNORMAL HIGH (ref 26.0–34.0)
MCHC: 35.3 g/dL (ref 30.0–36.0)
MCV: 114.4 fL — ABNORMAL HIGH (ref 80.0–100.0)
Platelets: 239 K/uL (ref 150–400)
RBC: 2.7 MIL/uL — ABNORMAL LOW (ref 4.22–5.81)
RDW: 13.2 % (ref 11.5–15.5)
WBC: 8.9 K/uL (ref 4.0–10.5)
nRBC: 0.3 % — ABNORMAL HIGH (ref 0.0–0.2)

## 2024-04-14 LAB — COMPREHENSIVE METABOLIC PANEL WITH GFR
ALT: 50 U/L — ABNORMAL HIGH (ref 0–44)
AST: 75 U/L — ABNORMAL HIGH (ref 15–41)
Albumin: 3.6 g/dL (ref 3.5–5.0)
Alkaline Phosphatase: 193 U/L — ABNORMAL HIGH (ref 38–126)
Anion gap: 17 — ABNORMAL HIGH (ref 5–15)
BUN: 13 mg/dL (ref 8–23)
CO2: 21 mmol/L — ABNORMAL LOW (ref 22–32)
Calcium: 10.4 mg/dL — ABNORMAL HIGH (ref 8.9–10.3)
Chloride: 98 mmol/L (ref 98–111)
Creatinine, Ser: 0.67 mg/dL (ref 0.61–1.24)
GFR, Estimated: >60 mL/min (ref >60)
Glucose, Bld: 133 mg/dL — ABNORMAL HIGH (ref 70–99)
Potassium: 3.9 mmol/L (ref 3.5–5.1)
Sodium: 135 mmol/L (ref 135–145)
Total Bilirubin: 0.4 mg/dL (ref 0.0–1.2)
Total Protein: 7.3 g/dL (ref 6.5–8.1)

## 2024-04-14 LAB — BLOOD GAS, VENOUS
Acid-base deficit: 0.7 mmol/L (ref 0.0–2.0)
Bicarbonate: 23.1 mmol/L (ref 20.0–28.0)
Drawn by: 8341
O2 Saturation: 75.1 %
Patient temperature: 37.1
pCO2, Ven: 34 mmHg — ABNORMAL LOW (ref 44–60)
pH, Ven: 7.44 — ABNORMAL HIGH (ref 7.25–7.43)
pO2, Ven: 44 mmHg (ref 32–45)

## 2024-04-14 LAB — AMMONIA: Ammonia: 27 umol/L (ref 9–35)

## 2024-04-14 LAB — MYCOPLASMA PNEUMONIAE ANTIBODY, IGM: Mycoplasma pneumo IgM: <770 U/mL (ref 0–769)

## 2024-04-14 MED ORDER — GADOBUTROL 1 MMOL/ML IV SOLN
8.0000 mL | Freq: Once | INTRAVENOUS | Status: AC | PRN
  Administered 2024-04-14: 8 mL via INTRAVENOUS

## 2024-04-14 MED ORDER — HALOPERIDOL LACTATE 5 MG/ML IJ SOLN
5.0000 mg | Freq: Four times a day (QID) | INTRAMUSCULAR | Status: DC | PRN
  Administered 2024-04-14 – 2024-04-18 (×3): 5 mg via INTRAVENOUS
  Filled 2024-04-14 (×3): qty 1

## 2024-04-14 MED ORDER — OLANZAPINE 10 MG IM SOLR
2.5000 mg | Freq: Once | INTRAMUSCULAR | Status: AC | PRN
  Administered 2024-04-14: 2.5 mg via INTRAMUSCULAR
  Filled 2024-04-14: qty 10

## 2024-04-14 MED ORDER — SODIUM CHLORIDE 0.9 % IV SOLN: INTRAVENOUS | Status: DC

## 2024-04-14 MED ORDER — APALUTAMIDE 60 MG PO TABS: 240.0000 mg | ORAL_TABLET | Freq: Every day | ORAL | Status: DC

## 2024-04-14 MED ORDER — CHLORHEXIDINE GLUCONATE CLOTH 2 % EX PADS
6.0000 | MEDICATED_PAD | Freq: Every day | CUTANEOUS | Status: DC
  Administered 2024-04-15 – 2024-05-03 (×17): 6 via TOPICAL

## 2024-04-14 NOTE — Progress Notes (Signed)
 At 03:08, Received a call from telemetry about an increase in patients Heart rate. Upon entering patients room, patient has ripped ECG leads off, was trying to get out of bed, and was confused. Patient was re-oriented, but patient was unable to be re-oriented stating this is a scam. Patient called friend [name] who also tried to re-orient patient and calm him down. Patient requested to speak with police so security was called to bedside. DOROTHA Kipper, NP was notified and patient was given zyprexa. Patient still unable to be calmed down and re-oriented so PRN ativan  was administered. Patient received a recruitment consultant and is now resting comfortably in bed. Bed in low position, bed alarm on, and call bell within reach.

## 2024-04-14 NOTE — Plan of Care (Signed)

## 2024-04-14 NOTE — Progress Notes (Signed)
 PT Cancellation Note  Patient Details Name: David Townsend MRN: [id number] DOB: July 16, 1960   Cancelled Treatment:    Reason Eval/Treat Not Completed: Patient at procedure or test/unavailable. Pt off the floor at MRI. Will continue to follow for PT eval as schedule permits.    Metta Ave PT, DPT 04/14/24, 2:39 PM

## 2024-04-14 NOTE — Plan of Care (Signed)
   Problem: Elimination: Goal: Will not experience complications related to urinary retention Outcome: Progressing   Problem: Safety: Goal: Ability to remain free from injury will improve Outcome: Progressing   Problem: Skin Integrity: Goal: Risk for impaired skin integrity will decrease Outcome: Progressing

## 2024-04-14 NOTE — Progress Notes (Signed)
 PROGRESS NOTE    David Townsend  [name] DOB: [birth year] DOA: 04/13/2024 PCP: Jaycee Greig PARAS, NP    Brief Narrative:   David Townsend is a 63 y.o. male with past medical history significant for prostate cancer with metastasis to bone, anemia of chronic medical disease, HTN, HLD, GERD, pain of malignancy with chronic opioid dependence,, tobacco/alcohol use disorder who presented to [facility] ED on 04/10/2024 with multiple complaints including generalized weakness, frequent falls at home.  Recently hospitalized and discharged on 04/10/2024 for uncontrolled cancer pain, pain management/palliative care was consulted, his pain regimen was adjusted on discharge. He started to take morphine  IR 30 mg every 3-4 hours for severe pain and morphine  ER 60 mg twice daily. He was discharged home on Friday. Since Saturday, patient started develop generalized weakness, especially bilateral lower extremities, and sustained several falls at home. Gradually getting worse and today he could not stand on his feet due to severe weakness. He denied any nauseous vomiting abdominal pain. He does have a chronic dry cough which he attributed to cigarette smoking but denied any fever chills no chest pains. He denied any dysuria or urinary frequency, no diarrhea.   In the ED, temperature 99.0 F, HR 106, RR 20, BP 151/87, SpO2 96% on 3 L nasal cannula.  WBC 14.6, hemoglobin 10.2, platelet count 235.  Sodium 132, potassium 4.1, chloride 93, CO2 19, glucose 84, BUN 17, creatinine 0.74.  Alkaline phosphatase 226.  AST 2076, ALT 43, total bilirubin 0.7.  Procalcitonin 0.95. Urinalysis with trace leukocytes, positive nitrite, many bacteria, 11-20 WBCs.  Respiratory viral panel negative.  Chest x-ray with increased upper and midlung zone predominant airspace opacification concerning for viral versus atypical pneumonia.  Blood cultures x 2 and urine culture obtained.  Patient was given ceftriaxone  and azithromycin in ED.  TRH consulted for  admission for further evaluation and management of progressive weakness, falls, pneumonia.  Assessment & Plan:   Intractable pain of malignancy Prostate cancer metastasis to bone, progressive Patient presenting with once again poorly controlled pain with generalized weakness, ataxia.  Etiology likely multifactorial given polypharmacy, high-dose narcotic use.  Patient reports despite high dose of narcotics, back pain is still not controlled.  Recent MR L-spine with without contrast 10/24 with progressive osseous metastasis throughout the lumbar and visualized lower thoracic spine without extraosseous extension or pathologic fracture, progressive moderate foraminal narrowing L3-4, right greater than left; progressive moderate subarticular and foraminal stenosis at L4-5, worse on the right, progressive moderate right and mild left foraminal stenosis at L5-S1.  CT angiogram chest/abdomen/pelvis 11/27/2023 also notes sclerotic metastasis posterior medial right ilium, posterior left ilium.  Reviewed by neurosurgery, Dr. Gillie who does not believe operative treatment would be beneficial with the significant tumor burden in the spine.  TLSO brace was recommended during previous hospitalization, but patient declined use. -- Palliative care, radiation oncology following, appreciate assistance -- MRI C/T-spine: Pending to evaluate for interval change -- MS Contin  60 mg PO q8h -- MS IR 15 mg PO q4h PRN severe pain -- Decadron  4mg  IV q6h -- Robaxin 500 mg p.o. PO q6h PRN muscle spasms -- Continue Erleada  120 mg PO daily -- Urology plans to proceed with Xofigo on an outpatient basis -- Radiation oncology following for continued radiation while inpatient -- TLSO brace ordered during previous hospitalization but patient declined use  Community-acquired versus atypical pneumonia Chest x-ray with findings concerning for pneumonia versus atypical infection.  Elevated WBC count. -- Blood cultures x 2: Pending --  Azithromycin 500  mg PO daily -- Ceftriaxone  1g IV q24h -- Oxygen now weaned off  Urinary tract infection  Urinalysis with trace leukocytes, positive nitrite, many bacteria, 11-20 WBCs. -- Urine culture: Pan -- Ceftriaxone  1 g IV every 24 hours  Agitation/confusion Overnight, patient became agitated, ripping off telemetry leads requiring IV Ativan  followed by Zyprexa.  Currently sitter present at bedside.  Suspect etiology multifactorial given his progressive malignancy, polypharmacy. -- Supportive care -- Sitter -- Haldol 5 mg IV every 6 hours as needed agitation   Hyponatremia Sodium 132 on admission, likely secondary to dehydration in the setting of poor oral intake, EtOH use outpatient.   -- BMP daily   HTN -- Amlodipine  10 mg p.o. daily -- Irbesartan  75 mg p.o. daily (substituted for home valsartan  40mg  PO daily) -- Hydralazine 10 mg IV every 8 hours as needed SBP >180   HLD -- Atorvastatin  20 mg p.o. daily   GERD Currently not taking PPI outpatient.   EtOH use disorder Endorses drinking 3-4 mixed drinks and 3-4 beers per day.  EtOH level less than 15.  Counseled on need for complete cessation/abstinence. -- CIWA protocol with symptom triggered Ativan    Tobacco use disorder Dors is smoking 1 pack/day of cigarettes.  Counseled on need for complete cessation.   -- Nicotine  patch    Weakness/ability/deconditioning: Frequent falls -- PT/OT evaluation: Pending   DVT prophylaxis: enoxaparin  (LOVENOX ) injection 40 mg Start: 04/14/24 1000    Code Status: Full Code Family Communication: None  Disposition Plan:  Level of care: Telemetry Status is: Inpatient Remains inpatient appropriate because: Pain control, IV antibiotics, therapy evaluation    Consultants:  Palliative care Radiation oncology  Procedures:  None  Antimicrobials:  Azithromycin 11/10>> Ceftriaxone  11/10>>   Subjective: Patient seen examined at bedside, lying in bed.  Remains confused.   Sitter present at bedside.  Agitated overnight received Ativan , Zyprexa.  Continues to report pain not well-controlled.  No family present.  Unable obtain further ROS due to his current mental status.  No other concerns overnight per nursing staff.  Objective: Vitals:   04/14/24 0800 04/14/24 0815 04/14/24 0956 04/14/24 1004  BP:  128/73 128/73 128/73  Pulse: (!) 160 99 92 99  Resp:    18  Temp:    98.7 F (37.1 C)  TempSrc:    Oral  SpO2:    97%  Height:        Intake/Output Summary (Last 24 hours) at 04/14/2024 1101 Last data filed at 04/14/2024 0917 Gross per 24 hour  Intake 591 ml  Output 2120 ml  Net -1529 ml   There were no vitals filed for this visit.  Examination:  Physical Exam: GEN: NAD, alert, confused, chronically ill in appearance, appears older than stated age HEENT: NCAT, PERRL, EOMI, sclera clear, dry mucous membranes PULM: Breath sounds slightly diminished lower bases, no wheezes/crackles, normal respiratory effort without accessory muscle use, on room air with SpO2 97% at rest CV: RRR  GI: abd soft, NTND, + BS MSK: no peripheral edema, moves all extremities independently NEURO: No focal neurological deficit appreciated but poor participation in physical exam this morning Integumentary: No concerning rashes/lesions/wounds no exposed skin surfaces    Data Reviewed: I have personally reviewed following labs and imaging studies  CBC: Recent Labs  Lab 04/07/24 1841 04/08/24 0557 04/13/24 1217  WBC 9.5 8.6 14.6*  NEUTROABS 6.6  --  12.7*  HGB 11.5* 11.3* 10.2*  HCT 35.2* 33.2* 31.8*  MCV 114.3* 114.9* 115.2*  PLT 185  171 235   Basic Metabolic Panel: Recent Labs  Lab 04/07/24 1841 04/08/24 0557 04/09/24 0622 04/10/24 0538 04/13/24 1217  NA 131* 134* 135 135 132*  K 3.9 4.0 4.5 4.5 4.1  CL 96* 101 99 99 93*  CO2 21* 23 25 22  19*  GLUCOSE 92 98 98 116* 84  BUN 7* 7* 6* 7* 17  CREATININE 0.80 0.71 0.68 0.67 0.74  CALCIUM  10.0 9.4 10.1 9.9  10.5*  MG  --  1.6* 1.9 2.0  --   PHOS  --  3.2  --  4.1  --    GFR: Estimated Creatinine Clearance: 90.9 mL/min (by C-G formula based on SCr of 0.74 mg/dL). Liver Function Tests: Recent Labs  Lab 04/07/24 1841 04/13/24 1217  AST 41 76*  ALT 18 43  ALKPHOS 257* 226*  BILITOT 0.5 0.7  PROT 6.7 7.1  ALBUMIN 4.2 3.9   No results for input(s): LIPASE, AMYLASE in the last 168 hours. No results for input(s): AMMONIA in the last 168 hours. Coagulation Profile: No results for input(s): INR, PROTIME in the last 168 hours. Cardiac Enzymes: No results for input(s): CKTOTAL, CKMB, CKMBINDEX, TROPONINI in the last 168 hours. BNP (last 3 results) No results for input(s): PROBNP in the last 8760 hours. HbA1C: No results for input(s): HGBA1C in the last 72 hours. CBG: No results for input(s): GLUCAP in the last 168 hours. Lipid Profile: No results for input(s): CHOL, HDL, LDLCALC, TRIG, CHOLHDL, LDLDIRECT in the last 72 hours. Thyroid  Function Tests: No results for input(s): TSH, T4TOTAL, FREET4, T3FREE, THYROIDAB in the last 72 hours. Anemia Panel: No results for input(s): VITAMINB12, FOLATE, FERRITIN, TIBC, IRON , RETICCTPCT in the last 72 hours. Sepsis Labs: Recent Labs  Lab 04/13/24 1217  PROCALCITON 0.95    Recent Results (from the past 240 hours)  Blood culture (routine x 2)     Status: None (Preliminary result)   Collection Time: 04/13/24  1:49 PM   Specimen: BLOOD RIGHT WRIST  Result Value Ref Range Status   Specimen Description   Final    BLOOD RIGHT WRIST Performed at [facility], [address], [place], [place] [address]    Special Requests   Final    BOTTLES DRAWN AEROBIC AND ANAEROBIC Blood Culture adequate volume Performed at [facility], [address] [place], [place] [address]    Culture   Final    NO GROWTH < 24 HOURS Performed at [facility], [address]  [place], [place] [address]    Report Status PENDING  Incomplete  Blood culture (routine x 2)     Status: None (Preliminary result)   Collection Time: 04/13/24  1:49 PM   Specimen: BLOOD RIGHT FOREARM  Result Value Ref Range Status   Specimen Description   Final    BLOOD RIGHT FOREARM Performed at [facility], [address] [place], [place] [address]    Special Requests   Final    BOTTLES DRAWN AEROBIC AND ANAEROBIC Blood Culture adequate volume Performed at [facility], [address] [place], [place] [address]    Culture   Final    NO GROWTH < 24 HOURS Performed at [facility], [address], [place], [place] [address]    Report Status PENDING  Incomplete  Respiratory (~20 pathogens) panel by PCR     Status: None   Collection Time: 04/13/24  2:58 PM   Specimen: Nasopharyngeal Swab; Respiratory  Result Value Ref Range Status   Adenovirus NOT DETECTED NOT DETECTED Final   Coronavirus 229E NOT DETECTED NOT DETECTED Final    Comment: (NOTE) The Coronavirus on the Respiratory Panel, DOES NOT test for the novel  Coronavirus (2019 nCoV)    Coronavirus HKU1 NOT DETECTED NOT DETECTED Final   Coronavirus NL63 NOT DETECTED NOT DETECTED Final   Coronavirus OC43 NOT DETECTED NOT DETECTED Final   Metapneumovirus NOT DETECTED NOT DETECTED Final   Rhinovirus / Enterovirus NOT DETECTED NOT DETECTED Final   Influenza A NOT DETECTED NOT DETECTED Final   Influenza B NOT DETECTED NOT DETECTED Final   Parainfluenza Virus 1 NOT DETECTED NOT DETECTED Final   Parainfluenza Virus 2 NOT DETECTED NOT DETECTED Final   Parainfluenza Virus 3 NOT DETECTED NOT DETECTED Final   Parainfluenza Virus 4 NOT DETECTED NOT DETECTED Final   Respiratory Syncytial Virus NOT DETECTED NOT DETECTED Final   Bordetella pertussis NOT DETECTED NOT DETECTED Final   Bordetella Parapertussis NOT DETECTED NOT DETECTED Final   Chlamydophila pneumoniae NOT DETECTED NOT DETECTED Final    Mycoplasma pneumoniae NOT DETECTED NOT DETECTED Final    Comment: Performed at [facility], [address] [place], [place] [address]         Radiology Studies: DG CHEST PORT 1 VIEW Result Date: 04/14/2024 CLINICAL DATA:  Pneumonia . EXAM: PORTABLE CHEST 1 VIEW COMPARISON:  04/13/2024 FINDINGS: The cardio pericardial silhouette is enlarged. Diffuse interstitial and patchy airspace disease again noted with progression of airspace disease in the left apex and right mid and lower lung. No substantial pleural effusion. The cardio pericardial silhouette is enlarged. Telemetry leads overlie the chest. Gaseous distention of colon noted in the visualized upper abdomen. IMPRESSION: Diffuse interstitial and patchy airspace disease with progression of airspace disease in the left apex and right mid and lower lung. Electronically Signed   By: Camellia Candle M.D.   On: 04/14/2024 07:45   DG Chest Port 1 View Result Date: 04/13/2024 CLINICAL DATA:  Shortness of breath, chest pain and back pain. Fall. EXAM: PORTABLE CHEST 1 VIEW COMPARISON:  04/10/2024 and CT chest 11/27/2023. FINDINGS: Trachea is midline. Heart size within normal limits. Diffuse hazy airspace opacification, somewhat upper and midlung zone predominant and progressive from 04/10/2024. No pleural effusions. IMPRESSION: Increasing hazy upper and midlung zone predominant airspace opacification may be due to a viral or atypical pneumonia. Electronically Signed   By: Newell Eke M.D.   On: 04/13/2024 12:53        Scheduled Meds:  amLODipine   10 mg Oral Daily   atorvastatin   20 mg Oral Daily   azithromycin  500 mg Oral Daily   Chlorhexidine  Gluconate Cloth  6 each Topical Daily   dexamethasone  (DECADRON ) injection  4 mg Intravenous Q6H   enoxaparin  (LOVENOX ) injection  40 mg Subcutaneous Q24H   folic acid   1 mg Oral Daily   furosemide   20 mg Oral Daily   irbesartan   75 mg Oral Daily   morphine   60 mg Oral Q8H   multivitamin  with minerals  1 tablet Oral Daily   polyethylene glycol  17 g Oral BID   senna-docusate  2 tablet Oral BID   thiamine   100 mg Oral Daily   Or   thiamine   100 mg Intravenous Daily   Continuous Infusions:  cefTRIAXone  (ROCEPHIN )  IV       LOS: 1 day    Time spent: 52 minutes spent on 04/14/2024 caring for this patient  face-to-face including chart review, ordering labs/tests, documenting, discussion with nursing staff, consultants, updating family and interview/physical exam    Camellia PARAS Brittnie Lewey, DO [facility] Available via Epic secure chat 7am-7pm After these hours, please refer to coverage provider listed on [email or web address] 04/14/2024, 11:01 AM

## 2024-04-14 NOTE — Progress Notes (Signed)
 Daily Progress Note   Patient Name: David Townsend       Date: 04/14/2024 DOB: June 07, 1960  Age: 63 y.o. MRN#: [id number] Attending Physician: Austria, Eric J, DO Primary Care Physician: Jaycee Greig PARAS, NP Admit Date: 04/13/2024  Reason for Consultation/Follow-up: Establishing goals of care  Subjective: Resting in bed, now asleep, overnight events noted, safety sitter at bedside.   Length of Stay: 1  Current Medications: Scheduled Meds:   amLODipine   10 mg Oral Daily   apalutamide   240 mg Oral Daily   atorvastatin   20 mg Oral Daily   azithromycin  500 mg Oral Daily   Chlorhexidine  Gluconate Cloth  6 each Topical Daily   dexamethasone  (DECADRON ) injection  4 mg Intravenous Q6H   enoxaparin  (LOVENOX ) injection  40 mg Subcutaneous Q24H   folic acid   1 mg Oral Daily   furosemide   20 mg Oral Daily   irbesartan   75 mg Oral Daily   morphine   60 mg Oral Q8H   multivitamin with minerals  1 tablet Oral Daily   polyethylene glycol  17 g Oral BID   senna-docusate  2 tablet Oral BID   thiamine   100 mg Oral Daily   Or   thiamine   100 mg Intravenous Daily    Continuous Infusions:  sodium chloride  100 mL/hr at 04/14/24 1226   cefTRIAXone  (ROCEPHIN )  IV      PRN Meds: haloperidol lactate, ibuprofen , LORazepam  **OR** LORazepam , methocarbamol, morphine , ondansetron  **OR** ondansetron  (ZOFRAN ) IV  Physical Exam         Resting in bed No edema Regular work of breathing Appears with generalized weakness  Vital Signs: BP (!) 148/81 (BP Location: Right Arm)   Pulse (!) 112   Temp 98.1 F (36.7 C) (Oral)   Resp 19   Ht 5' 9 (1.753 m)   SpO2 97%   BMI 22.15 kg/m  SpO2: SpO2: 97 % O2 Device: O2 Device: Nasal Cannula O2 Flow Rate: O2 Flow Rate (L/min): 3 L/min  Intake/output  summary:  Intake/Output Summary (Last 24 hours) at 04/14/2024 1310 Last data filed at 04/14/2024 0917 Gross per 24 hour  Intake 591 ml  Output 2120 ml  Net -1529 ml   LBM: Last BM Date : 04/13/24 Baseline Weight:   Most recent weight:  Palliative Assessment/Data:      Patient Active Problem List   Diagnosis Date Noted   Frequent falls 04/13/2024   Palliative care encounter 04/09/2024   Goals of care, counseling/discussion 04/09/2024   Medication management 04/09/2024   Counseling and coordination of care 04/09/2024   Secondary malignant neoplasm of bone (HCC) 04/09/2024   Malignant neoplasm metastatic to bone (HCC) 04/09/2024   Chronic low back pain without sciatica 04/08/2024   Generalized weakness 04/08/2024   Cancer-related pain 04/07/2024   Lobar pneumonia, unspecified organism 09/28/2022   ARDS (adult respiratory distress syndrome) (HCC) 09/28/2022   Rhabdomyolysis 09/19/2022   Hyponatremia 09/19/2022   Sepsis (HCC) 09/19/2022   Prostate cancer metastatic to bone (HCC) 04/23/2022   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   Tobacco abuse 04/23/2022   Essential hypertension 04/23/2022   Urinary retention 04/23/2022   Hyperlipidemia 03/27/2022   ETOH abuse 11/01/2015    Palliative Care Assessment & Plan   Patient Profile:    Assessment: David Townsend is a 63 y.o. male with past medical history significant for prostate cancer with metastasis to bone, anemia of chronic medical disease, HTN, HLD, GERD, pain of malignancy with chronic opioid dependence,, tobacco/alcohol use disorder who presented to [facility] ED on 04/10/2024 with multiple complaints including generalized weakness, frequent falls at home.   Recently hospitalized and discharged on 04/10/2024 for uncontrolled cancer pain, pain management/palliative care was consulted, his pain regimen was adjusted on discharge. He started to take morphine  IR 30 mg every 3-4 hours  for severe pain and morphine  ER 60 mg twice daily. He was discharged home on Friday. Since Saturday, patient started develop generalized weakness, especially bilateral lower extremities, and sustained several falls at home. Gradually getting worse and re admitted to the hospital.   Recommendations/Plan: Call placed and goals of care discussions held with HCPOA [name], patient's advance care planning documents are now scanned onto the chart and have been reviewed with HCPOA friend [name]. Patient has elected for full code status at this time, as per ACP documents that have been completed recently. Discussed with [name] about patient's MRI and current condition in detail, to the best of my ability.  Full code full scope care for now Recommend SNF rehab with palliative, patient lives alone and HCPOA friend [name] is available but doesn't live with the patient, [name] is worried about the patient's frequent falls and worries that he is not taking his medications as prescribed. Will recommend TOC consult.    Goals of Care and Additional Recommendations: Limitations on Scope of Treatment: Full Scope Treatment  Code Status:    Code Status Orders  (From admission, onward)           Start     Ordered   04/13/24 1418  Full code  Continuous       Question:  By:  Answer:  Consent: discussion documented in EHR   04/13/24 1418           Code Status History     Date Active Date Inactive Code Status Order ID Comments User Context   04/07/2024 2244 04/10/2024 2246 Full Code [id number]  Lou Claretta HERO, MD ED   09/19/2022 2249 10/04/2022 2135 Full Code [id number]  Ricky Alfrieda DASEN, DO Inpatient       Prognosis:  Unable to determine  Discharge Planning: To Be Determined  Care plan was discussed with  HCPOA [name] on phone.    Thank you for allowing the Palliative Medicine  Team to assist in the care of this patient. High MDM.      Greater than 50%  of this time was spent counseling and  coordinating care related to the above assessment and plan.  Lonia Serve, MD  Please contact Palliative Medicine Team phone at [phone] for questions and concerns.

## 2024-04-14 NOTE — Progress Notes (Addendum)
       Overnight   NAME: David Townsend MRN: [id number] DOB : [birth year]    Date of Service   04/14/2024   HPI/Events of Note    Notified by RN for patient confused, attempting to leave.  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  Admitted through ER for pneumonia and pleurisy, frequent falls, general weakness.  Bedside visit Nursing staff alerted by monitoring system for elevated heart rate, upon checking on patient he was sitting on the side of the bed, confused, stating that the nursing staff members were fraud's fakes and running a scam. Patient was physically aggressive, however confused. Patient had called 9-1-1 at this point.  Staff was able to administer IM medication, and with onset of medication patient calm to allow CIWA scale medication.   Interventions/ Plan   One-to-one sitter Continue CIWA scale Continue orientation as able       Update 0436 hours  Patient is currently calm able to rest. Doak is in place.     Lynwood Kipper BSN MSNA MSN ACNPC-AG Acute Care Nurse Practitioner [facility]

## 2024-04-15 ENCOUNTER — Ambulatory Visit

## 2024-04-15 ENCOUNTER — Other Ambulatory Visit: Payer: Self-pay

## 2024-04-15 DIAGNOSIS — R296 Repeated falls: Secondary | ICD-10-CM | POA: Diagnosis not present

## 2024-04-15 LAB — RAD ONC ARIA SESSION SUMMARY
Course Elapsed Days: 5
Plan Fractions Treated to Date: 1
Plan Prescribed Dose Per Fraction: 2.889 Gy
Plan Total Fractions Prescribed: 9
Plan Total Prescribed Dose: 26.001 Gy
Reference Point Dosage Given to Date: 2.889 Gy
Reference Point Session Dosage Given: 2.889 Gy
Session Number: 2

## 2024-04-15 LAB — PHOSPHORUS: Phosphorus: 2.5 mg/dL (ref 2.5–4.6)

## 2024-04-15 LAB — COMPREHENSIVE METABOLIC PANEL WITH GFR
ALT: 64 U/L — ABNORMAL HIGH (ref 0–44)
AST: 93 U/L — ABNORMAL HIGH (ref 15–41)
Albumin: 3.5 g/dL (ref 3.5–5.0)
Alkaline Phosphatase: 174 U/L — ABNORMAL HIGH (ref 38–126)
Anion gap: 14 (ref 5–15)
BUN: 18 mg/dL (ref 8–23)
CO2: 21 mmol/L — ABNORMAL LOW (ref 22–32)
Calcium: 9.6 mg/dL (ref 8.9–10.3)
Chloride: 103 mmol/L (ref 98–111)
Creatinine, Ser: 0.65 mg/dL (ref 0.61–1.24)
GFR, Estimated: >60 mL/min (ref >60)
Glucose, Bld: 118 mg/dL — ABNORMAL HIGH (ref 70–99)
Potassium: 3.7 mmol/L (ref 3.5–5.1)
Sodium: 138 mmol/L (ref 135–145)
Total Bilirubin: 0.3 mg/dL (ref 0.0–1.2)
Total Protein: 6.5 g/dL (ref 6.5–8.1)

## 2024-04-15 LAB — CBC
HCT: 30.5 % — ABNORMAL LOW (ref 39.0–52.0)
Hemoglobin: 10.2 g/dL — ABNORMAL LOW (ref 13.0–17.0)
MCH: 38.8 pg — ABNORMAL HIGH (ref 26.0–34.0)
MCHC: 33.4 g/dL (ref 30.0–36.0)
MCV: 116 fL — ABNORMAL HIGH (ref 80.0–100.0)
Platelets: 252 K/uL (ref 150–400)
RBC: 2.63 MIL/uL — ABNORMAL LOW (ref 4.22–5.81)
RDW: 13.4 % (ref 11.5–15.5)
WBC: 8.3 K/uL (ref 4.0–10.5)
nRBC: 0.5 % — ABNORMAL HIGH (ref 0.0–0.2)

## 2024-04-15 LAB — MAGNESIUM: Magnesium: 2 mg/dL (ref 1.7–2.4)

## 2024-04-15 LAB — LEGIONELLA PNEUMOPHILA SEROGP 1 UR AG: L. pneumophila Serogp 1 Ur Ag: NEGATIVE

## 2024-04-15 MED ORDER — LACTATED RINGERS IV SOLN: INTRAVENOUS | Status: DC

## 2024-04-15 MED ORDER — DEXAMETHASONE SODIUM PHOSPHATE 4 MG/ML IJ SOLN
4.0000 mg | Freq: Two times a day (BID) | INTRAMUSCULAR | Status: DC
  Administered 2024-04-15 – 2024-04-16 (×3): 4 mg via INTRAVENOUS
  Filled 2024-04-15 (×4): qty 1

## 2024-04-15 NOTE — Progress Notes (Signed)
  Progress Note   Patient: David Townsend [name] DOB: Jun 09, 1960 DOA: 04/13/2024     2 DOS: the patient was seen and examined on 04/15/2024 at 11:22AM      Brief hospital course: 63 y.o. M with castration resistant metastatic prostate cancer, HTN, and alcohol use who presented with falls.  Recently hospitalized and discharged on 04/10/2024 for uncontrolled cancer pain, pain management/palliative care was consulted, his pain regimen was adjusted on discharge. He started to take morphine  IR 30 mg every 3-4 hours for severe pain and morphine  ER 60 mg twice daily. He was discharged home on Friday. Since Saturday, patient started develop generalized weakness, especially bilateral lower extremities, and sustained several falls at home. Gradually getting worse and today he could not stand on his feet due to severe weakness.         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 - Continue apalutamide , Decadron  - Continue morphine  - Consult palliative care - Consult radiation oncology, medical oncology, urology   Community-acquired versus atypical pneumonia -Continue Rocephin  and azithromycin   Acute metabolic encephalopathy -Wean dexamethasone  - Avoid Ativan  - Haldol as needed for agitation   Hyponatremia Resolved   HTN Blood pressure controlled - Continue amlodipine , furosemide , irbesartan    HLD -Hold Lipitor   EtOH use disorder Unclear how much she is actively drinking - Continue CIWA score for now            Subjective: Worse confusion today.  No complaints of pain.  Nursing report no new changes.     Physical Exam: BP (!) 140/85 (BP Location: Left Arm)   Pulse (!) 105   Temp 98.3 F (36.8 C)   Resp 18   Ht 5' 9 (1.753 m)   SpO2 97%   BMI 22.15  kg/m   Elderly adult male, lying in bed, appears weak and tired Tachycardic, regular, no murmurs, no peripheral edema Respiratory rate normal, lungs clear without rales or wheezes Abdomen soft, no tenderness palpation or guarding, no ascites or distention Attention decreased, face symmetric, speech fluent, generalized weakness but symmetric strength    Data Reviewed: Basic metabolic panel shows normal electrolytes and renal function CBC shows mild anemia, leukocytosis resolved    Family Communication: Called to girlfriend, no answer.  Friend at the bedside, updated    Disposition: Status is: Inpatient         Author: Lonni SHAUNNA Dalton, MD 04/15/2024 5:27 PM  For on call review [email or web address].

## 2024-04-15 NOTE — Progress Notes (Signed)
 Daily Progress Note   Patient Name: David Townsend       Date: 04/15/2024 DOB: 03-23-1961  Age: 63 y.o. MRN#: [id number] Attending Physician: David Townsend, * Primary Care Physician: David Greig PARAS, NP Admit Date: 04/13/2024  Reason for Consultation/Follow-up: Establishing goals of care  Subjective: Resting in bed, awake but doesn't verbalize much,safety sitter at bedside.   Length of Stay: 2  Current Medications: Scheduled Meds:   amLODipine   10 mg Oral Daily   apalutamide   240 mg Oral Daily   azithromycin  500 mg Oral Daily   Chlorhexidine  Gluconate Cloth  6 each Topical Daily   dexamethasone  (DECADRON ) injection  4 mg Intravenous Q6H   enoxaparin  (LOVENOX ) injection  40 mg Subcutaneous Q24H   folic acid   1 mg Oral Daily   furosemide   20 mg Oral Daily   irbesartan   75 mg Oral Daily   morphine   60 mg Oral Q8H   multivitamin with minerals  1 tablet Oral Daily   polyethylene glycol  17 g Oral BID   senna-docusate  2 tablet Oral BID   thiamine   100 mg Oral Daily   Or   thiamine   100 mg Intravenous Daily    Continuous Infusions:  cefTRIAXone  (ROCEPHIN )  IV 1 g (04/14/24 1500)    PRN Meds: haloperidol lactate, ibuprofen , LORazepam  **OR** LORazepam , methocarbamol, morphine , ondansetron  **OR** ondansetron  (ZOFRAN ) IV  Physical Exam         Resting in bed No edema Regular work of breathing Appears with generalized weakness  Vital Signs: BP (!) 149/93   Pulse 95   Temp 97.9 F (36.6 C)   Resp 19   Ht 5' 9 (1.753 m)   SpO2 97%   BMI 22.15 kg/m  SpO2: SpO2: 97 % O2 Device: O2 Device: Room Air O2 Flow Rate: O2 Flow Rate (L/min): 3 L/min  Intake/output summary:  Intake/Output Summary (Last 24 hours) at 04/15/2024 1240 Last data filed at 04/15/2024  0900 Gross per 24 hour  Intake 117 ml  Output 800 ml  Net -683 ml   LBM: Last BM Date : 04/13/24 Baseline Weight:   Most recent weight:         Palliative Assessment/Data:      Patient Active Problem List   Diagnosis Date Noted   Frequent falls 04/13/2024  Palliative care encounter 04/09/2024   Goals of care, counseling/discussion 04/09/2024   Medication management 04/09/2024   Counseling and coordination of care 04/09/2024   Secondary malignant neoplasm of bone (HCC) 04/09/2024   Malignant neoplasm metastatic to bone (HCC) 04/09/2024   Chronic low back pain without sciatica 04/08/2024   Generalized weakness 04/08/2024   Cancer-related pain 04/07/2024   Lobar pneumonia, unspecified organism 09/28/2022   ARDS (adult respiratory distress syndrome) (HCC) 09/28/2022   Rhabdomyolysis 09/19/2022   Hyponatremia 09/19/2022   Sepsis (HCC) 09/19/2022   Prostate cancer metastatic to bone (HCC) 04/23/2022   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   Tobacco abuse 04/23/2022   Essential hypertension 04/23/2022   Urinary retention 04/23/2022   Hyperlipidemia 03/27/2022   ETOH abuse 11/01/2015    Palliative Care Assessment & Plan   Patient Profile:    Assessment: David Townsend is a 63 y.o. male with past medical history significant for prostate cancer with metastasis to bone, anemia of chronic medical disease, HTN, HLD, GERD, pain of malignancy with chronic opioid dependence,, tobacco/alcohol use disorder who presented to [facility] ED on 04/10/2024 with multiple complaints including generalized weakness, frequent falls at home.   Recently hospitalized and discharged on 04/10/2024 for uncontrolled cancer pain, pain management/palliative care was consulted, his pain regimen was adjusted on discharge. He started to take morphine  IR 30 mg every 3-4 hours for severe pain and morphine  ER 60 mg twice daily. He was discharged home on Friday. Since Saturday,  patient started develop generalized weakness, especially bilateral lower extremities, and sustained several falls at home. Gradually getting worse and re admitted to the hospital.   Recommendations/Plan: 11-11: Call placed and goals of care discussions held with HCPOA David Townsend, patient's advance care planning documents are now scanned onto the chart and have been reviewed with HCPOA friend David Townsend. Patient has elected for full code status at this time, as per ACP documents that have been completed recently. Discussed with David Townsend about patient's MRI and current condition in detail, to the best of my ability.  Full code full scope care for now Recommend SNF rehab with palliative, patient lives alone and HCPOA friend David Townsend is available but doesn't live with the patient, David Townsend is worried about the patient's frequent falls and worries that he is not taking his medications as prescribed. Will recommend TOC consult.   11-12: Radiation oncology also following.  Recommend SNF rehab with palliative.  Continue to monitor symptoms.  Wean down decadron  IV  Goals of Care and Additional Recommendations: Limitations on Scope of Treatment: Full Scope Treatment  Code Status:    Code Status Orders  (From admission, onward)           Start     Ordered   04/13/24 1418  Full code  Continuous       Question:  By:  Answer:  Consent: discussion documented in EHR   04/13/24 1418           Code Status History     Date Active Date Inactive Code Status Order ID Comments User Context   04/07/2024 2244 04/10/2024 2246 Full Code [id number]  David Claretta HERO, MD ED   09/19/2022 2249 10/04/2022 2135 Full Code [id number]  David Alfrieda DASEN, DO Inpatient       Prognosis:  Unable to determine  Discharge Planning: SNF rehab with palliative.   Care plan was discussed with  IDT Thank you for allowing the Palliative Medicine Team to assist in the  care of this patient. mod MDM.      Greater than 50%  of this time  was spent counseling and coordinating care related to the above assessment and plan.  David Serve, MD  Please contact Palliative Medicine Team phone at [phone] for questions and concerns.

## 2024-04-15 NOTE — Progress Notes (Signed)
 I spoke with the patient who is confused and not at baseline. I was able to speak to his POA [name] and she states he had been very alert and communicated our plans to proceed with radiation and was in agreement to do this when she spoke with him on Saturday. She gives us  permission to proceed and trying to administer the patient's radiation and she and his other POA will try to encourage the patient and are willing to come for his treatments if they're able to be at the hospital when they're being administered. [place]ford is on the way, the other POA and I asked our team to call him to coordinate.

## 2024-04-15 NOTE — Progress Notes (Signed)
 PT Cancellation Note  Patient Details Name: David Townsend MRN: [id number] DOB: 08-Jan-1961   Cancelled Treatment:    Reason Eval/Treat Not Completed: Medical issues which prohibited therapy. Pt demonstrating confusion and agitation today. Will hold PT evaluation until pt is able to participate.    [name] 04/15/2024, 12:32 PM

## 2024-04-15 NOTE — Plan of Care (Signed)
  Problem: Elimination: Goal: Will not experience complications related to bowel motility Outcome: Progressing Goal: Will not experience complications related to urinary retention Outcome: Progressing   Problem: Pain Managment: Goal: General experience of comfort will improve and/or be controlled Outcome: Progressing   Problem: Skin Integrity: Goal: Risk for impaired skin integrity will decrease Outcome: Progressing   Problem: Clinical Measurements: Goal: Cardiovascular complication will be avoided Outcome: Not Progressing  Per telemetry monitoring, was having multiples runs of SVTs lasting from 10 seconds to 1 minute in length. Patient remained asymptomatic.   Problem: Coping: Goal: Level of anxiety will decrease Outcome: Not Progressing

## 2024-04-15 NOTE — Progress Notes (Signed)
 Pts POA [address] and [address].

## 2024-04-15 NOTE — Progress Notes (Signed)
 OT Cancellation Note  Patient Details Name: David Townsend MRN: [id number] DOB: August 30, 1960   Cancelled Treatment:    Reason Eval/Treat Not Completed: Medical issues which prohibited therapy;Fatigue/lethargy limiting ability to participate. OT orders received. Discussed with RN - pt with intermittent confusion & agitation, demonstrating unsafe behaviors. Received Ativan  and Haldol last night. OT will hold eval at this time and perform when pt appropriate.   Doyce Saling L. Kailea Dannemiller, OTR/L  04/15/24, 8:29 AM

## 2024-04-15 NOTE — TOC Initial Note (Addendum)
 Transition of Care [facility]) - Initial/Assessment Note    Patient Details  Name: David Townsend MRN: [id number] Date of Birth: 11/04/1960  Transition of [facility]) CM/SW Contact:    Toy LITTIE Agar, RN Phone Number:[phone]  04/15/2024, 12:21 PM  Clinical Narrative:                 Inpatient care manager acknowledges consult for SNF placement. CM will await therapy recommendations before initiating SNF bed search. Patient is currently confused and unable to participate in assessment. CM will reach out to contacts listed on chart for input on disposition planning and patients baseline info.   1534 PT attempted to evaluate patient but patient is unable to participate. CM will continue to follow.   Expected Discharge Plan: Skilled Nursing Facility Barriers to Discharge: Continued Medical Work up   Patient Goals and CMS Choice Patient states their goals for this hospitalization and ongoing recovery are:: Patient is confused does not answer no family present   Choice offered to / list presented to : NA [place] ownership interest in [facility].provided to::  (n/a)    Expected Discharge Plan and Services In-house Referral: NA Discharge Planning Services: CM Consult Post Acute Care Choice: NA Living arrangements for the past 2 months: Single Family Home                 DME Arranged: N/A DME Agency: NA       HH Arranged: NA HH Agency: NA        Prior Living Arrangements/Services Living arrangements for the past 2 months: Single Family Home Lives with:: Roommate Patient language and need for interpreter reviewed:: Yes Do you feel safe going back to the place where you live?:  (patient confused no family present)      Need for Family Participation in Patient Care: No (Comment) Care giver support system in place?: Yes (comment)   Criminal Activity/Legal Involvement Pertinent to Current Situation/Hospitalization: No - Comment as needed  Activities of Daily  Living   ADL Screening (condition at time of admission) Independently performs ADLs?: No Does the patient have a NEW difficulty with bathing/dressing/toileting/self-feeding that is expected to last >3 days?: No Does the patient have a NEW difficulty with getting in/out of bed, walking, or climbing stairs that is expected to last >3 days?: No Does the patient have a NEW difficulty with communication that is expected to last >3 days?: No Is the patient deaf or have difficulty hearing?: No Does the patient have difficulty seeing, even when wearing glasses/contacts?: No Does the patient have difficulty concentrating, remembering, or making decisions?: No  Permission Sought/Granted Permission sought to share information with :  (n/a confused unable to give consent) Permission granted to share information with : No              Emotional Assessment Appearance:: Appears stated age Attitude/Demeanor/Rapport: Unable to Assess (confuse) Affect (typically observed): Quiet, Unable to Assess (confused and nonverbal)   Alcohol / Substance Use: Alcohol Use (CIWA protocol) Psych Involvement: No (comment)  Admission diagnosis:  Prostate cancer (HCC) [C61] Weakness [R53.1] Frequent falls [R29.6] Metastatic carcinoma to bone (HCC) [C79.51] Falls [R29.6] Urinary tract infection without hematuria, site unspecified [N39.0] Pneumonia due to infectious organism, unspecified laterality, unspecified part of lung [J18.9] Patient Active Problem List   Diagnosis Date Noted   Frequent falls 04/13/2024   Palliative care encounter 04/09/2024   Goals of care, counseling/discussion 04/09/2024   Medication management 04/09/2024   Counseling and coordination of  care 04/09/2024   Secondary malignant neoplasm of bone (HCC) 04/09/2024   Malignant neoplasm metastatic to bone (HCC) 04/09/2024   Chronic low back pain without sciatica 04/08/2024   Generalized weakness 04/08/2024   Cancer-related pain 04/07/2024    Lobar pneumonia, unspecified organism 09/28/2022   ARDS (adult respiratory distress syndrome) (HCC) 09/28/2022   Rhabdomyolysis 09/19/2022   Hyponatremia 09/19/2022   Sepsis (HCC) 09/19/2022   Prostate cancer metastatic to bone (HCC) 04/23/2022   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   Tobacco abuse 04/23/2022   Essential hypertension 04/23/2022   Urinary retention 04/23/2022   Hyperlipidemia 03/27/2022   ETOH abuse 11/01/2015   PCP:  Jaycee Greig PARAS, NP Pharmacy:   [id number] [place], [place] - [address] AT [facility] [address] [place] [address] Phone: [phone] Fax: [phone]     Social Drivers of Health (SDOH) Social History: SDOH Screenings   Food Insecurity: No Food Insecurity (04/13/2024)  Housing: Low Risk  (04/13/2024)  Transportation Needs: No Transportation Needs (04/13/2024)  Utilities: Not At Risk (04/13/2024)  Alcohol Screen: Medium Risk (12/04/2023)  Depression (PHQ2-9): Low Risk  (12/04/2023)  Tobacco Use: High Risk (04/13/2024)   SDOH Interventions:     Readmission Risk Interventions     No data to display

## 2024-04-15 NOTE — Hospital Course (Addendum)
 Brief Narrative:   63 y.o. M with castration resistant metastatic prostate cancer, HTN, and alcohol use who presented with falls.   Recently hospitalized for uncontrolled cancer pain, medications titrated and he was discharged home.  However, at home, he was too weak to care for himself, fell multiples times and so was btought back to the hospital.  Here, he developed severe delirium on hospital day 2, which has improved but he remains extremely weak and with some short term memory issues/processing deficits.    During the hospitalization he was evaluated by radiation oncology and started on radiation which he completed on 11/20.  They also recommended Decadron  taper which has been prescribed.  Encephalopathy was slowly improving.  Multiple attempts for MRI of the brain were made but patient was unable to tolerate this. During the hospitalization he was also treated for with 5 days of Rocephin  and azithromycin  for presumed community-acquired pneumonia. Physical therapy team is recommending SNF therefore TOC has been consulted.  Assessment & Plan:  Acute metabolic encephalopathy  Etiology has been somewhat unclear.  Patient has not been able to tolerate MRI, multiple attempts have been made by the previous provider.  There is concerns that that could be cancer related delirium therefore on Decadron  weaning protocol as mentioned below.  Repeat ammonia, B12, TSH, folate - Standard delirium precautions.   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  Overall patient does have poor prognosis.  Patient seen by oncology who is recommending to continue to finish palliative radiation.  Depending on progression, may offer  further treatment otherwise continue to engage palliative care and consider hospice.   Last day of Radiation 11/20  Community-acquired versus atypical pneumonia Treated with 5 days Rocephin  and azithromycin , now resolved.   HTN  Norvasc , Avapro , Lasix .  IV as needed   HLD - Lipitor held due to transaminitis, worsening prognosis    Transaminitis  Resolved   DVT prophylaxis: Lovenox     Code Status: Full Code Family Communication:   Status is: Inpatient Remains inpatient appropriate because: SNF placement  PT Follow up Recs: Skilled Nursing-Short Term Rehab (<3 Hours/Day)04/21/2024 1008  Subjective: No complaints eating his breakfast  Examination:  General exam: Appears calm and comfortable  Respiratory system: Clear to auscultation. Respiratory effort normal. Cardiovascular system: S1 & S2 heard, RRR. No JVD, murmurs, rubs, gallops or clicks. No pedal edema. Gastrointestinal system: Abdomen is nondistended, soft and nontender. No organomegaly or masses felt. Normal bowel sounds heard. Central nervous system: Alert and oriented to name and place. No focal neurological deficits. Extremities: Symmetric 5 x 5 power. Skin: No rashes, lesions or ulcers Psychiatry: Judgement and insight appear poor

## 2024-04-16 ENCOUNTER — Ambulatory Visit

## 2024-04-16 ENCOUNTER — Other Ambulatory Visit: Payer: Self-pay

## 2024-04-16 DIAGNOSIS — Z7189 Other specified counseling: Secondary | ICD-10-CM

## 2024-04-16 DIAGNOSIS — R296 Repeated falls: Secondary | ICD-10-CM | POA: Diagnosis not present

## 2024-04-16 DIAGNOSIS — C7951 Secondary malignant neoplasm of bone: Secondary | ICD-10-CM

## 2024-04-16 DIAGNOSIS — R531 Weakness: Secondary | ICD-10-CM

## 2024-04-16 DIAGNOSIS — C61 Malignant neoplasm of prostate: Secondary | ICD-10-CM | POA: Diagnosis not present

## 2024-04-16 LAB — COMPREHENSIVE METABOLIC PANEL WITH GFR
ALT: 58 U/L — ABNORMAL HIGH (ref 0–44)
AST: 46 U/L — ABNORMAL HIGH (ref 15–41)
Albumin: 3.5 g/dL (ref 3.5–5.0)
Alkaline Phosphatase: 165 U/L — ABNORMAL HIGH (ref 38–126)
Anion gap: 12 (ref 5–15)
BUN: 13 mg/dL (ref 8–23)
CO2: 27 mmol/L (ref 22–32)
Calcium: 9.9 mg/dL (ref 8.9–10.3)
Chloride: 101 mmol/L (ref 98–111)
Creatinine, Ser: 0.55 mg/dL — ABNORMAL LOW (ref 0.61–1.24)
GFR, Estimated: >60 mL/min (ref >60)
Glucose, Bld: 108 mg/dL — ABNORMAL HIGH (ref 70–99)
Potassium: 3.2 mmol/L — ABNORMAL LOW (ref 3.5–5.1)
Sodium: 139 mmol/L (ref 135–145)
Total Bilirubin: 0.3 mg/dL (ref 0.0–1.2)
Total Protein: 6.4 g/dL — ABNORMAL LOW (ref 6.5–8.1)

## 2024-04-16 LAB — RAD ONC ARIA SESSION SUMMARY
Course Elapsed Days: 6
Plan Fractions Treated to Date: 2
Plan Prescribed Dose Per Fraction: 2.889 Gy
Plan Total Fractions Prescribed: 9
Plan Total Prescribed Dose: 26.001 Gy
Reference Point Dosage Given to Date: 5.778 Gy
Reference Point Session Dosage Given: 2.889 Gy
Session Number: 3

## 2024-04-16 LAB — CBC
HCT: 29.8 % — ABNORMAL LOW (ref 39.0–52.0)
HCT: 30.3 % — ABNORMAL LOW (ref 39.0–52.0)
Hemoglobin: 10.4 g/dL — ABNORMAL LOW (ref 13.0–17.0)
Hemoglobin: 10.3 g/dL — ABNORMAL LOW (ref 13.0–17.0)
MCH: 38.7 pg — ABNORMAL HIGH (ref 26.0–34.0)
MCH: 38 pg — ABNORMAL HIGH (ref 26.0–34.0)
MCHC: 34.9 g/dL (ref 30.0–36.0)
MCHC: 34 g/dL (ref 30.0–36.0)
MCV: 110.8 fL — ABNORMAL HIGH (ref 80.0–100.0)
MCV: 111.8 fL — ABNORMAL HIGH (ref 80.0–100.0)
Platelets: 255 K/uL (ref 150–400)
Platelets: 248 K/uL (ref 150–400)
RBC: 2.69 MIL/uL — ABNORMAL LOW (ref 4.22–5.81)
RBC: 2.71 MIL/uL — ABNORMAL LOW (ref 4.22–5.81)
RDW: 13.2 % (ref 11.5–15.5)
RDW: 13.3 % (ref 11.5–15.5)
WBC: 8.7 K/uL (ref 4.0–10.5)
WBC: 10.1 K/uL (ref 4.0–10.5)
nRBC: 0.3 % — ABNORMAL HIGH (ref 0.0–0.2)
nRBC: 0 % (ref 0.0–0.2)

## 2024-04-16 MED ORDER — POTASSIUM CHLORIDE CRYS ER 20 MEQ PO TBCR
40.0000 meq | EXTENDED_RELEASE_TABLET | Freq: Every day | ORAL | Status: DC
  Administered 2024-04-16 – 2024-04-22 (×7): 40 meq via ORAL
  Filled 2024-04-16 (×7): qty 2

## 2024-04-16 NOTE — TOC Progression Note (Addendum)
 Transition of Care [facility]) - Progression Note    Patient Details  Name: David Townsend MRN: [id number] Date of Birth: 1961-03-27  [facility]) CM/SW Contact  Toy LITTIE Agar, RN Phone Number:[phone]  04/16/2024, 1:09 PM  Clinical Narrative:    [facility] acknowledges consult for SNF  per OT recommendations.  Patient is intermittently confused. CM has reached out to HCPOA (paperwork on chart) [name]. [name] confirms that her and friend Medford share responsibility of making healthcare decisions. Krist requested permission to add Medford to the call. CM has explained the short term rehab process and recommendation to HCPOA and [name] & Chirs. Both are in agreement and Medford states that he feels that he can discuss with patient and convince him to be in agreement for short term rehab. CM has provided [place] with facility list. CM will follow medical progression and for PT recommendation.   1336 Awaiting PT evaluation. PT has documented attempts to evaluate. Will continue to follow.       Expected Discharge Plan: Skilled Nursing Facility Barriers to Discharge: Continued Medical Work up               Expected Discharge Plan and Services In-house Referral: NA Discharge Planning Services: CM Consult Post Acute Care Choice: NA Living arrangements for the past 2 months: Single Family Home                 DME Arranged: N/A DME Agency: NA       HH Arranged: NA HH Agency: NA         Social Drivers of Health (SDOH) Interventions SDOH Screenings   Food Insecurity: No Food Insecurity (04/13/2024)  Housing: Low Risk  (04/13/2024)  Transportation Needs: No Transportation Needs (04/13/2024)  Utilities: Not At Risk (04/13/2024)  Alcohol Screen: Medium Risk (12/04/2023)  Depression (PHQ2-9): Low Risk  (12/04/2023)  Tobacco Use: High Risk (04/13/2024)    Readmission Risk Interventions    04/16/2024    1:08 PM  Readmission Risk Prevention Plan  Transportation  Screening Complete  Medication Review (RN Care Manager) Referral to Pharmacy  PCP or Specialist appointment within 3-5 days of discharge Complete  HRI or Home Care Consult Complete  SW Recovery Care/Counseling Consult Complete  Palliative Care Screening Not Applicable  Skilled Nursing Facility Complete

## 2024-04-16 NOTE — Progress Notes (Signed)
  Progress Note   Patient: David Townsend [name] DOB: [birth year] DOA: 04/13/2024     3 DOS: the patient was seen and examined on 04/16/2024       Brief hospital course: 63 y.o. M with castration resistant metastatic prostate cancer, HTN, and alcohol use who presented with falls.  Recently hospitalized and discharged on 04/10/2024 for uncontrolled cancer pain, pain management/palliative care was consulted, his pain regimen was adjusted on discharge. He started to take morphine  IR 30 mg every 3-4 hours for severe pain and morphine  ER 60 mg twice daily. He was discharged home on Friday. Since Saturday, patient started develop generalized weakness, especially bilateral lower extremities, and sustained several falls at home. Gradually getting worse and today he could not stand on his feet due to severe weakness.         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 - Continue apalutamide , Decadron  - Continue morphine  - Consult palliative care - Consult radiation oncology, medical oncology, urology   Community-acquired versus atypical pneumonia -Continue Rocephin  and azithromycin   Acute metabolic encephalopathy Ment[place] appears much better today.  Review of timing of onset of delirium procedure changes and morphine  and initiation of Ativan .  Doubt this is medication effect - Continue dexamethasone  wean. - Obtain MRI brain   Hyponatremia Resolved   HTN Blood pressure controlled - Continue amlodipine , furosemide , irbesartan    HLD -Hold Lipitor   EtOH use disorder Unclear how much she is actively drinking - Continue CIWA score for now   Hypokalemia - Supplement potassium  Transaminitis - Hold statin          Subjective: Worse confusion today.  No complaints  of pain.  Nursing report no new changes.     Physical Exam: BP 139/79 (BP Location: Left Arm)   Pulse 94   Temp 98.4 F (36.9 C) (Oral)   Resp 20   Ht 5' 9 (1.753 m)   Wt 67.9 kg   SpO2 91%   BMI 22.09 kg/m   Elderly adult male, lying in bed, appears weak and tired Tachycardic, regular, no murmurs, no peripheral edema Respiratory rate normal, lungs clear without rales or wheezes Abdomen soft, no tenderness palpation or guarding, no ascites or distention Attention decreased, face symmetric, speech fluent, generalized weakness but symmetric strength    Data Reviewed: Discussed with oncology and palliative care Basic metabolic panel shows mild hyponatremia and hypokalemia Creatinine stable LFTs improved from yesterday CBC shows anemia, no new leukocytosis  Family Communication: Friend at the bedside    Disposition: Status is: Inpatient         Author: Lonni SHAUNNA Dalton, MD 04/16/2024 6:31 PM  For on call review [email or web address].

## 2024-04-16 NOTE — Progress Notes (Signed)
 Daily Progress Note   Patient Name: David Townsend       Date: 04/16/2024 DOB: Oct 12, 1960  Age: 63 y.o. MRN#: [id number] Attending Physician: Jonel Lonni SQUIBB, * Primary Care Physician: Jaycee Greig PARAS, NP Admit Date: 04/13/2024  Reason for Consultation/Follow-up: Establishing goals of care  Subjective: Resting in chair, awake but doesn't verbalize much,safety sitter at bedside. When asked if I can update HCPOA [name] about how he is going, patient replied - sure.   Length of Stay: 3  Current Medications: Scheduled Meds:   amLODipine   10 mg Oral Daily   apalutamide   240 mg Oral Daily   azithromycin  500 mg Oral Daily   Chlorhexidine  Gluconate Cloth  6 each Topical Daily   dexamethasone  (DECADRON ) injection  4 mg Intravenous Q12H   enoxaparin  (LOVENOX ) injection  40 mg Subcutaneous Q24H   folic acid   1 mg Oral Daily   furosemide   20 mg Oral Daily   irbesartan   75 mg Oral Daily   morphine   60 mg Oral Q8H   multivitamin with minerals  1 tablet Oral Daily   polyethylene glycol  17 g Oral BID   potassium chloride   40 mEq Oral Daily   senna-docusate  2 tablet Oral BID   thiamine   100 mg Oral Daily   Or   thiamine   100 mg Intravenous Daily    Continuous Infusions:  cefTRIAXone  (ROCEPHIN )  IV Stopped (04/15/24 1500)    PRN Meds: haloperidol lactate, ibuprofen , LORazepam  **OR** LORazepam , methocarbamol, morphine , ondansetron  **OR** ondansetron  (ZOFRAN ) IV  Physical Exam         Resting in chair.  No edema Regular work of breathing Appears with generalized weakness  Vital Signs: BP (!) 155/93 (BP Location: Left Arm)   Pulse 93   Temp 97.7 F (36.5 C) (Oral)   Resp 14   Ht 5' 9 (1.753 m)   Wt 67.9 kg   SpO2 97%   BMI 22.09 kg/m  SpO2: SpO2: 97 % O2 Device: O2  Device: Room Air O2 Flow Rate: O2 Flow Rate (L/min): 3 L/min  Intake/output summary:  Intake/Output Summary (Last 24 hours) at 04/16/2024 1252 Last data filed at 04/16/2024 0701 Gross per 24 hour  Intake 838.94 ml  Output 2200 ml  Net -1361.06 ml   LBM: Last BM Date : 04/14/24 Baseline  Weight: Weight: 67.9 kg Most recent weight: Weight: 67.9 kg       Palliative Assessment/Data:      Patient Active Problem List   Diagnosis Date Noted   Frequent falls 04/13/2024   Palliative care encounter 04/09/2024   Goals of care, counseling/discussion 04/09/2024   Medication management 04/09/2024   Counseling and coordination of care 04/09/2024   Secondary malignant neoplasm of bone (HCC) 04/09/2024   Malignant neoplasm metastatic to bone (HCC) 04/09/2024   Chronic low back pain without sciatica 04/08/2024   Generalized weakness 04/08/2024   Cancer-related pain 04/07/2024   Lobar pneumonia, unspecified organism 09/28/2022   ARDS (adult respiratory distress syndrome) (HCC) 09/28/2022   Rhabdomyolysis 09/19/2022   Hyponatremia 09/19/2022   Sepsis (HCC) 09/19/2022   Prostate cancer metastatic to bone (HCC) 04/23/2022   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   Tobacco abuse 04/23/2022   Essential hypertension 04/23/2022   Urinary retention 04/23/2022   Hyperlipidemia 03/27/2022   ETOH abuse 11/01/2015    Palliative Care Assessment & Plan   Patient Profile:    Assessment: David Townsend is a 63 y.o. male with past medical history significant for prostate cancer with metastasis to bone, anemia of chronic medical disease, HTN, HLD, GERD, pain of malignancy with chronic opioid dependence,, tobacco/alcohol use disorder who presented to [facility] ED on 04/10/2024 with multiple complaints including generalized weakness, frequent falls at home.   Recently hospitalized and discharged on 04/10/2024 for uncontrolled cancer pain, pain management/palliative care  was consulted, his pain regimen was adjusted on discharge. He started to take morphine  IR 30 mg every 3-4 hours for severe pain and morphine  ER 60 mg twice daily. He was discharged home on Friday. Since Saturday, patient started develop generalized weakness, especially bilateral lower extremities, and sustained several falls at home. Gradually getting worse and re admitted to the hospital.   Recommendations/Plan: 11-11: Call placed and goals of care discussions held with HCPOA [name], patient's advance care planning documents are now scanned onto the chart and have been reviewed with HCPOA friend [name]. Patient has elected for full code status at this time, as per ACP documents that have been completed recently. Discussed with [name] about patient's MRI and current condition in detail, to the best of my ability.  Full code full scope care for now Recommend SNF rehab with palliative, patient lives alone and HCPOA friend [name] is available but doesn't live with the patient, [name] is worried about the patient's frequent falls and worries that he is not taking his medications as prescribed. Will recommend TOC consult.   11-12: Radiation oncology also following.  Recommend SNF rehab with palliative.  Continue to monitor symptoms.  Wean down decadron  IV 11-13: Call placed and discussed with HCPOA [name] after obtaining patient's permission. We discussed about patient's ongoing functional decline, again re visited code status discussions, we talked about elevated liver function tests, ultrasound from 2024 alluding to possibility of cirrhosis, his episodic confusion, recent generalized weakness and falls. Talked about appropriateness of DNR DNI at this point, she will reflect on this and discuss with patient. Full code for now. I told her about MRI brain which is ordered and pending. Updated [name] to the best of my ability.   Goals of Care and Additional Recommendations: Limitations on Scope of Treatment:  Full Scope Treatment  Code Status:    Code Status Orders  (From admission, onward)           Start  Ordered   04/13/24 1418  Full code  Continuous       Question:  By:  Answer:  Consent: discussion documented in EHR   04/13/24 1418           Code Status History     Date Active Date Inactive Code Status Order ID Comments User Context   04/07/2024 2244 04/10/2024 2246 Full Code [id number]  Lou Claretta HERO, MD ED   09/19/2022 2249 10/04/2022 2135 Full Code [id number]  Ricky Alfrieda DASEN, DO Inpatient       Prognosis:  Unable to determine  Discharge Planning: SNF rehab with palliative.   Care plan was discussed with  [name] on phone, discussed with patient, discussed on ssecure chat with [facility] and oncology.  Thank you for allowing the Palliative Medicine Team to assist in the care of this patient. High MDM.      Greater than 50%  of this time was spent counseling and coordinating care related to the above assessment and plan.  Lonia Serve, MD  Please contact Palliative Medicine Team phone at [phone] for questions and concerns.

## 2024-04-16 NOTE — Consult Note (Signed)
 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 - Imaging done 11/27/2023 showed multifocal widespread bone mets.  Repeat imaging 04/14/2024 showed unchanged appearance of metastases. - Radiation oncology following.  Patient has been on radiation therapy, continue as appropriate. - Urology following - Appreciate palliative input.  Agree with recommendations for SNF upon discharge. - Medical oncology/Dr. Tina following and will make further recommendations.  Anemia - Likely secondary to malignancy and bone marrow suppression from alcoholism. -Patient seen in 2023 at [facility] for anemia.  At that time he stated he was not interested in quitting alcohol, he declined colonoscopy. - Hemoglobin 10.4.  Baseline appears to be in the 11-12 range. - No intervention required at this time - Continue to monitor CBC with differential  Generalized weakness Frequent falls Altered mentation - Patient admitted with increasing weakness and frequent falls at home. - Currently on one-to-one observation with sitter for safety - Continue supportive care  History of B12 deficiency anemia - Diagnosed November 2023.  Patient declined to get B12 monthly injections and preferred to follow-up with PCP.  It is unclear whether he continued treatment.  Code Status Full    All questions were answered. The patient knows to call the clinic with any problems, questions or concerns.  David Townsend Miyanna Wiersma, NP   I personally spent a total of 50 minutes in the care of the patient today.  David David Tina, MD 04/16/2024 9:20 AM    HISTORY OF PRESENTING ILLNESS:  David Townsend 63 y.o. male consulted for metastatic prostate cancer to the bones. Patient is admitted for generalized  weakness, and frequent falls at home.   Oncologic history significant for metastatic prostate cancer to the bones. Therefore oncology evaluation has been requested. Patient seen awake and somewhat alert although with altered mentation.  Indeed he has one-to-one sitter for safety as he has been trying to climb out of the bed, in view of his history of frequent falls at home.  Patient is a poor historian although he states he is okay patient's close friend David Townsend also provided some history along with documentation in his chart. Medical history includes anemia, hypertension, and hyperlipidemia. Social history significant for tobacco use, and alcohol abuse.  Patient's friend David Townsend reports that he drinks a lot of alcohol daily.  Patient formerly worked as a journalist, newspaper.  Summary of oncologic history as follows: Per records: Initial DX - Grade 5 disease 2023 on eval PSA 403; presented with urinary obstruction  Most Recent Staging - 11/2023 CT - some progresion of diffuse bone mets (skull, ribs, spine, pelvis, humerus, femurs)  Primary Therapy - none  Current Thearpy - androgen deprivation + reduced dose Erleada  (120, through [facility])   11/2022 - PSA 0.1, T 21 ==> Eligard 45, stay Erleada   06/2023 - PSA 2.53, T 13, CMP OK ==> Eligard 45, stay Erleada .; 12/2023 PSA 12.3, T 20, CMP OK ==> Eligard 45, stay Erleada    He presented to ER at the [facility] location on 03/27/2024 complaining of back pain, and an MRI of the lumbar spine with and without contrast confirmed progressive osseous disease throughout the lumbar and lower thoracic spine that could be visualized.   04/2024 PSA 244.93, CT progressive bone mets + Pathologic T spine fracture.   04/08/24 MRI  T spine 1. Progressive, widespread metastatic disease throughout the thoracic spine. 2. Multiple new or progressive pathologic compression fractures, most notably at T8 where there is an unhealed fracture with 30% height loss and  mild retropulsion. 3. 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 Mild spinal cord edema is not excluded with assessment limited by motion.   Evaluated by Rad Onc and plan on palliative radiation to T- spine.  04/13/24 presented to ED with fall at home and complains of increasing weakness. MRI T and C-spine showed osseous metastatic disease throughout. Report confusion since admission.  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    SURGICAL HISTORY: Past Surgical History:  Procedure Laterality Date   NO PAST SURGERIES     PENILE BIOPSY N/A 01/12/2020   Procedure: EXCISION OF PENILE LESION;  Surgeon: Elisabeth Valli BIRCH, MD;  Location: [facility];  Service: Urology;  Laterality: N/A;    SOCIAL HISTORY: Social History   Socioeconomic History   Marital status: Single    Spouse name: n/a   Number of children: 0   Years of education: 12+   Highest education level: Not on file  Occupational History   Occupation: golf guy at [employer]  Tobacco Use   Smoking status: Every Day    Current packs/day: 2.00    Average packs/day: 2.0 packs/day for 34.0 years (68.0 ttl pk-yrs)    Types: Cigarettes    Passive exposure: Current   Smokeless tobacco: Never  Vaping Use   Vaping status: Never Used  Substance and Sexual Activity   Alcohol use: Yes    Alcohol/week: 7.0 standard drinks of alcohol    Types: 4 Cans of beer, 3 Shots of liquor per week    Comment: 5-6 beers per day   Drug use: No   Sexual activity: Not on file  Other Topics Concern   Not on file  Social History Narrative   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   Lives alone.   Golf Pro   Social Drivers of Health   Financial Resource Strain: Not on file  Food Insecurity: No Food Insecurity (04/13/2024)   Hunger Vital Sign    Worried About Running Out of Food in the Last Year: Never true    Ran Out of Food in the Last Year: Never true  Transportation Needs: No Transportation Needs (04/13/2024)   PRAPARE - Administrator, Civil Service (Medical): No    Lack of Transportation (Non-Medical): No  Physical Activity: Not on file  Stress: Not on file  Social Connections: Not on file  Intimate Partner Violence: Not At Risk (04/13/2024)   Humiliation, Afraid, Rape, and Kick questionnaire    Fear of Current or Ex-Partner: No    Emotionally Abused: No    Physically Abused: No    Sexually Abused: No    FAMILY HISTORY: Family History  Family history unknown: Yes    ALLERGIES:  has no known allergies.  MEDICATIONS:  Current Facility-Administered Medications  Medication Dose Route Frequency Provider Last Rate Last Admin   amLODipine  (NORVASC ) tablet 10 mg  10 mg Oral Daily Zhang,  Cort DASEN, MD   10 mg at 04/15/24 9057   apalutamide  (ERLEADA ) tablet 240 mg  240 mg Oral Daily Austria, Camellia PARAS, DO       azithromycin (ZITHROMAX) tablet 500 mg  500 mg Oral Daily Laurita Cort T, MD   500 mg at 04/15/24 1430   cefTRIAXone  (ROCEPHIN ) 1 g in sodium chloride  0.9 % 100 mL IVPB  1 g Intravenous Q24H Laurita Cort DASEN, MD   Stopped at 04/15/24 1500   Chlorhexidine  Gluconate Cloth 2 % PADS 6 each  6 each Topical Daily Austria, Eric J, DO   6 each at 04/15/24 0944   dexamethasone  (DECADRON ) injection 4 mg  4 mg Intravenous Q12H Anwar, Zeba, MD   4 mg at 04/16/24 0520   enoxaparin  (LOVENOX ) injection 40 mg  40 mg Subcutaneous Q24H Laurita Cort T, MD   40 mg at 04/15/24 0945   folic acid  (FOLVITE ) tablet 1 mg  1 mg Oral Daily Laurita Cort T, MD   1 mg at 04/15/24 9057   furosemide  (LASIX ) tablet 20 mg  20 mg Oral Daily Laurita Cort T,  MD   20 mg at 04/15/24 0942   haloperidol lactate (HALDOL) injection 5 mg  5 mg Intravenous Q6H PRN Austria, Eric J, DO   5 mg at 04/14/24 2314   ibuprofen  (ADVIL ) tablet 800 mg  800 mg Oral Q8H PRN Laurita Cort T, MD       irbesartan  (AVAPRO ) tablet 75 mg  75 mg Oral Daily Laurita Cort T, MD   75 mg at 04/15/24 0942   LORazepam  (ATIVAN ) tablet 1-4 mg  1-4 mg Oral Q1H PRN Laurita Cort T, MD       Or   LORazepam  (ATIVAN ) injection 1-4 mg  1-4 mg Intravenous Q1H PRN Laurita Cort T, MD   1 mg at 04/15/24 1506   methocarbamol (ROBAXIN) tablet 500 mg  500 mg Oral Q6H PRN Laurita Cort T, MD   500 mg at 04/14/24 2021   morphine  (MS CONTIN ) 12 hr tablet 60 mg  60 mg Oral Q8H Laurita Cort T, MD   60 mg at 04/16/24 9480   morphine  (MSIR) tablet 15 mg  15 mg Oral Q4H PRN Laurita Cort T, MD   15 mg at 04/15/24 1716   multivitamin with minerals tablet 1 tablet  1 tablet Oral Daily Laurita Cort T, MD   1 tablet at 04/15/24 9057   ondansetron  (ZOFRAN ) tablet 4 mg  4 mg Oral Q6H PRN Laurita Cort DASEN, MD       Or   ondansetron  (ZOFRAN ) injection 4 mg  4 mg Intravenous Q6H PRN Laurita Cort T, MD       polyethylene glycol (MIRALAX / GLYCOLAX) packet 17 g  17 g Oral BID Laurita Cort T, MD   17 g at 04/15/24 2149   potassium chloride  SA (KLOR-CON  M) CR tablet 40 mEq  40 mEq Oral Daily Danford, Lonni SQUIBB, MD       senna-docusate (Senokot-S) tablet 2 tablet  2 tablet Oral BID Laurita Cort T, MD   2 tablet at 04/15/24 2149   thiamine  (VITAMIN B1) tablet 100 mg  100 mg Oral Daily Laurita Cort T, MD   100 mg at 04/15/24 9057   Or   thiamine  (VITAMIN B1) injection 100 mg  100 mg Intravenous Daily Laurita Cort T, MD        REVIEW OF SYSTEMS:   Constitutional: Denies fevers, weight loss or abnormal night sweats Eyes:  Denies visual change Ears, nose, mouth, throat, and face: Denies sore throat or enlarged tongue Respiratory: Denies cough, shortness of breath or wheezes Cardiovascular: Denies palpitation, chest discomfort or chest  pain Gastrointestinal:  Denies nausea, vomiting, diarrhea, constipation, heartburn or abdominal pain GU: Denies any hesitancy, dysuria, frequency, hematuria Skin: Denies abnormal skin rashes Lymphatics: Denies new lymphadenopathy or mass Neurological: Denies numbness, tingling or new weaknesses All other systems were reviewed with the patient and are negative.  PHYSICAL EXAMINATION: ECOG PERFORMANCE STATUS: 4 - Bedbound  Vitals:   04/15/24 2146 04/16/24 0523  BP: (!) 151/98 (!) 155/93  Pulse: 93 93  Resp: 16 14  Temp: 97.9 F (36.6 C) 97.7 F (36.5 C)  SpO2: 99% 98%   Filed Weights   04/15/24 1739  Weight: 149 lb 9.6 oz (67.9 kg)    GENERAL:alert, no distress and comfortable +somnolent +chronically ill-appearing SKIN: skin color normal. No jaundice EYES: normal, conjunctiva normal, sclera clear OROPHARYNX: no exudate, moist NECK: supple. No mass LYMPH:  no palpable cervical, axillary or inguinal lymphadenopathy LUNGS: clear to auscultation and normal breathing effort.  No wheeze or rales HEART: regular rate & rhythm and no murmurs ABDOMEN:abdomen soft, non-tender and normal bowel sounds Musculoskeletal:  no lower extremity edema NEURO: +altered mental status Strength and sensation equal bilaterally  LABORATORY DATA:  I have reviewed the data as listed Lab Results  Component Value Date   WBC 8.7 04/16/2024   HGB 10.4 (L) 04/16/2024   HCT 29.8 (L) 04/16/2024   MCV 110.8 (H) 04/16/2024   PLT 255 04/16/2024   Recent Labs    11/28/23 1957 03/27/24 1438 04/14/24 1107 04/15/24 0626 04/16/24 0700  NA  --    < > 135 138 139  K  --    < > 3.9 3.7 3.2*  CL  --    < > 98 103 101  CO2  --    < > 21* 21* 27  GLUCOSE  --    < > 133* 118* 108*  BUN  --    < > 13 18 13   CREATININE  --    < > 0.67 0.65 0.55*  CALCIUM   --    < > 10.4* 9.6 9.9  GFRNONAA  --    < > >60 >60 >60  PROT 8.6*   < > 7.3 6.5 6.4*  ALBUMIN 5.2*   < > 3.6 3.5 3.5  AST 29   < > 75* 93* 46*  ALT  27   < > 50* 64* 58*  ALKPHOS 147*   < > 193* 174* 165*  BILITOT 1.2   < > 0.4 0.3 0.3  BILIDIR 0.1  --   --   --   --   IBILI 1.1*  --   --   --   --    < > = values in this interval not displayed.    RADIOGRAPHIC STUDIES: I have personally reviewed the radiological images as listed and agreed with the findings in the report. MR CERVICAL SPINE W WO CONTRAST Result Date: 04/14/2024 EXAM: MRI CERVICAL SPINE WITH AND WITHOUT CONTRAST 04/14/2024 02:57:42 PM TECHNIQUE: Multiplanar multisequence MRI of the cervical spine was performed without and with the administration of intravenous contrast. 8 mL (gadobutrol (GADAVIST) 1 MMOL/ML injection 8 mL GADOBUTROL 1 MMOL/ML IV SOLN). COMPARISON: MRI thoracic spine 04/14/2024 and 04/08/2024. No prior cervical spine imaging. CLINICAL HISTORY: Metastatic disease evaluation. FINDINGS: BONES AND ALIGNMENT: Mild reversal of the normal cervical lordosis. No significant listhesis.  Preserved cervical vertebral body heights without a definite fracture identified. Widespread abnormal bone marrow signal consistent with known metastatic disease, most notable throughout the mid and lower cervical and included upper thoracic spine with skull base involvement noted as well (left occipital condyle). Prominent involvement of the right sided posterior elements at C5. SPINAL CORD: Normal spinal cord size. Normal spinal cord signal. SOFT TISSUES: No paraspinal mass. Mildly prominent smooth dorsal epidural enhancement in a bilateral and symmetric fashion throughout the cervical spine as well as slightly more prominent dorsal and ventral epidural enhancement in the included upper thoracic spine. No masslike epidural enhancement. DISC LEVELS: C2-C3: Mild facet arthrosis without disc herniation or stenosis. C3-C4: Severe disc space narrowing. A broad based posterior disc osteophyte complex and mild facet arthrosis result in mild left sided spinal stenosis and mild to moderate right and  severe left neural foraminal stenosis. C4-C5: Severe disc space narrowing. A broad based posterior disc osteophyte complex and mild facet arthrosis result in mild spinal stenosis and severe right and moderate left neural foraminal stenosis. C5-C6: Moderate disc space narrowing. Disc bulging, uncovertebral spurring, and mild facet arthrosis result in mild spinal stenosis and severe bilateral neural foraminal stenosis. C6-C7: Severe disc space narrowing. A broad based posterior disc osteophyte complex and mild facet arthrosis result in mild spinal stenosis and severe bilateral neural foraminal stenosis. C7-T1: Severe facet arthrosis results in borderline to mild right and mild to moderate left neural foraminal stenosis without spinal stenosis. IMPRESSION: 1. Osseous metastatic disease throughout the cervical and including upper thoracic spine as well as involving the skull base. 2. Mildly prominent dorsal epidural enhancement throughout the cervical spine as well as ventral and dorsal epidural enhancement in the included upper thoracic spine. This is favored to reflect prominent epidural venous plexus, although small volume epidural tumor is not excluded. 3. Widespread disc degeneration with mild multilevel spinal stenosis and severe neural foraminal stenosis as above. Electronically signed by: Dasie Hamburg MD 04/14/2024 04:56 PM EST RP Workstation: [id number]   MR THORACIC SPINE W WO CONTRAST Result Date: 04/14/2024 EXAM: MRI THORACIC SPINE WITHOUT AND WITH INTRAVENOUS CONTRAST 04/14/2024 02:57:42 PM TECHNIQUE: Multiplanar multisequence MRI of the thoracic spine was performed without and with the administration of 8 mL of gadobutrol (GADAVIST) 1 MMOL/ML intravenous contrast. COMPARISON: MRI thoracic spine 04/08/2024. CLINICAL HISTORY: Myelopathy, acute, thoracic spine. FINDINGS: LIMITATIONS: The examination is motion degraded, including moderate to severe motion on the axial T2 gradient echo sequence and mild to  moderate motion on other sequences. BONES AND ALIGNMENT: Normal alignment. Extensive, widespread metastatic disease throughout the vertebral bodies and posterior elements of the thoracic spine has not significantly changed from the recent prior MRI. Multiple compression fractures are again seen as detailed on the prior MRI without evidence of interval progression, and this includes a T8 fracture with 30% height loss and mild retropulsion. There is also mild retropulsion at T4 and T6. Assessment for marrow edema is again limited due to widespread marrow signal abnormality. Mildly prominent ventral epidural enhancement throughout the upper and mid thoracic spine is similar to the prior MRI and again favored to predominantly reflect prominent epidural venous plexus, although small volume epidural tumor is difficult to exclude. No masslike epidural enhancement is present. SPINAL CORD: Questionable mild hazy T2/STIR hyperintensity throughout the mid thoracic spinal cord, although this is most conspicuous on the sagittal STIR sequence and is not clearly confirmed on the standard sagittal and axial T2 sequences and may be artifactual. SOFT TISSUES: Dorsal epidural lipomatosis is  again noted and results in spinal stenosis with essentially complete CSF effacement from T3-T8 with mild mass effect on the spinal cord as before. IMPRESSION: 1. Unchanged appearance of widespread metastatic disease in the thoracic spine with multiple nonprogressive pathologic compression fractures. 2. Unchanged mildly prominent epidural enhancement in the upper and mid thoracic spine, again favored to predominantly reflect prominent epidural venous plexus with small volume epidural tumor not excluded. No mass-like epidural enhancement. 3. Unchanged severe spinal stenosis with CSF effacement from T3-T8 predominantly due to epidural lipomatosis. Low level spinal cord edema is not excluded. Electronically signed by: Dasie Hamburg MD 04/14/2024 04:37 PM  EST RP Workstation: [id number]   DG CHEST PORT 1 VIEW Result Date: 04/14/2024 CLINICAL DATA:  Pneumonia . EXAM: PORTABLE CHEST 1 VIEW COMPARISON:  04/13/2024 FINDINGS: The cardio pericardial silhouette is enlarged. Diffuse interstitial and patchy airspace disease again noted with progression of airspace disease in the left apex and right mid and lower lung. No substantial pleural effusion. The cardio pericardial silhouette is enlarged. Telemetry leads overlie the chest. Gaseous distention of colon noted in the visualized upper abdomen. IMPRESSION: Diffuse interstitial and patchy airspace disease with progression of airspace disease in the left apex and right mid and lower lung. Electronically Signed   By: Camellia Candle M.D.   On: 04/14/2024 07:45   DG Chest Port 1 View Result Date: 04/13/2024 CLINICAL DATA:  Shortness of breath, chest pain and back pain. Fall. EXAM: PORTABLE CHEST 1 VIEW COMPARISON:  04/10/2024 and CT chest 11/27/2023. FINDINGS: Trachea is midline. Heart size within normal limits. Diffuse hazy airspace opacification, somewhat upper and midlung zone predominant and progressive from 04/10/2024. No pleural effusions. IMPRESSION: Increasing hazy upper and midlung zone predominant airspace opacification may be due to a viral or atypical pneumonia. Electronically Signed   By: Newell Eke M.D.   On: 04/13/2024 12:53   DG CHEST PORT 1 VIEW Result Date: 04/10/2024 EXAM: 1 VIEW(S) XRAY OF THE CHEST 04/10/2024 07:07:00 AM COMPARISON: 11/26/2023 CLINICAL HISTORY: Dyspnea FINDINGS: LUNGS AND PLEURA: Low lung volumes. Bilateral ill defined peripheral and basilar airspace opacities. Mild predominantly perihilar interstitial prominence. Possible small left pleural effusion. No pulmonary edema. No pneumothorax. HEART AND MEDIASTINUM: No acute abnormality of the cardiac and mediastinal silhouettes. BONES AND SOFT TISSUES: No acute osseous abnormality. IMPRESSION: 1. Peripheral and basilar airspace  opacities with mild predominantly perihilar interstitial prominence, compatible with multifocal infection or inflammation. 2. Low lung volumes. 3. Possible small left pleural effusion. Electronically signed by: Katheleen Faes MD 04/10/2024 11:13 AM EST RP Workstation: [id number]   MR THORACIC SPINE W WO CONTRAST Result Date: 04/08/2024 EXAM: MRI THORACIC SPINE WITH AND WITHOUT INTRAVENOUS CONTRAST 04/08/2024 03:43:35 PM TECHNIQUE: Multiplanar multisequence MRI of the thoracic spine was performed with and without the administration of intravenous contrast. COMPARISON: MRI thoracic spine 09/19/2022. MRI lumbar spine 03/27/2024. CTA chest, abdomen, and pelvis 11/27/2023. CLINICAL HISTORY: Metastatic disease evaluation. Severe back pain. History of metastatic prostate cancer. FINDINGS: BONES AND ALIGNMENT: There is widespread metastatic disease involving every level in the thoracic spine which has greatly progressed from the prior thoracic spine MRI. There is complete or near complete marrow replacement in the T2 through T8 vertebral bodies with large lesions noted at T11 and T12 as well. Posterior element involvement is particularly extensive from T4 to T6. Multiple compression fractures are present. A T4 compression fracture is new from the prior thoracic MRI but was present on the interval chest CT with mildly progressive vertebral body height loss, now measuring  25%. A mild T3 compression fracture is new or progressive, with 10% vertebral body height loss. There are new T5 and T6 compression fractures with 20% height loss each. A T8 compression fracture is new or progressive with 30% height loss. Assessment for marrow edema associated with these fractures is limited due to the underlying widespread marrow signal abnormality; however, there is a visible unhealed fracture line within the T8 vertebral body extending to the posterior cortex. T8 retropulsion measures 2 to 3 mm. Mildly prominent epidural enhancement,  most prominent ventrally from T5 to T8, is favored to reflect prominent epidural venous plexus without masslike enhancement seen to clearly indicate epidural tumor, although minimal epidural tumor in this region is not excluded. SPINAL CORD: There is apparent mild hyperintensity throughout the majority of the thoracic spinal cord on the sagittal STIR sequence which is not clearly confirmed on the standard sagittal T2 sequence. Assessment of the spinal cord on axial images is limited due to severe motion artifact. SOFT TISSUES: Dorsal epidural lipomatosis has increased from the prior thoracic spine MRI and results in spinal stenosis with essentially complete CSF effacement from T3 to T8. There is mild mass effect on the spinal cord in the region from T3 to T8. IMPRESSION: 1. Progressive, widespread metastatic disease throughout the thoracic spine. 2. Multiple new or progressive pathologic compression fractures, most notably at T8 where there is an unhealed fracture with 30% height loss and mild retropulsion. 3. 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 Mild spinal cord edema is not excluded with assessment limited by motion. Electronically signed by: Dasie Hamburg MD 04/08/2024 04:17 PM EST RP Workstation: [id number]   MR Lumbar Spine W Wo Contrast Result Date: 03/27/2024 EXAM: MR Lumbar Spine with and without intravenous contrast. 03/27/2024 04:42:52 PM TECHNIQUE: Multiplanar multisequence MRI of the lumbar spine was performed with and without the administration of intravenous contrast. COMPARISON: MRI of the lumbar spine 17/24. CLINICAL HISTORY: Metastatic disease evaluation. Chronic back pain. History of prostate cancer. Recent PET scan shown metastases. Seen at [facility] and given oxy but no relief. FINDINGS: BONES AND ALIGNMENT: Progressive metastases are present throughout the lumbar and visualized lower thoracic spine.  Diffuse enhancement is present throughout the L2 vertebral body. A 15 mm lesion is present along the posterior inferior L2 vertebral body. A lesion anteriorly on the left at L4 measures up to 21 mm. Enhancing lesion extends through the left pedicle of L2 and into the posterior elements. A left lateral lesion at T12 extends into the left pedicle measuring 3.4 cm. No extraosseous extension of metastases is present. No pathologic fractures are present. Normal alignment. SPINAL CORD: The conus terminates normally. SOFT TISSUES: No acute abnormality. T12-L1: A left lateral lesion at T12 extends into the left pedicle measuring 3.4 cm. No disc herniation. No spinal canal stenosis or neural foraminal narrowing. L1-L2: No disc herniation. No spinal canal stenosis or neural foraminal narrowing. L2-L3: Diffuse enhancement is present throughout the L2 vertebral body. A 15 mm lesion is present along the posterior inferior L2 vertebral body. Enhancing lesion extends through the left pedicle of L2 and into the posterior elements. No disc herniation. No spinal canal stenosis or neural foraminal narrowing. L3-L4: A broad-based disc protrusion is present. Moderate foraminal narrowing has progressed bilaterally, right greater than left. L4-L5: A lesion anteriorly on the left at L4 measures up to 21 mm. A broad-based disc protrusion and moderate facet hypertrophy is present. Progressive moderate  subarticular and foraminal stenosis is worse right than left. L5-S1: A rightward disc protrusion and moderate facet hypertrophy results in progressive moderate right and mild left foraminal stenosis similar to the prior study. IMPRESSION: 1. Progressive osseous metastases throughout the lumbar and visualized lower thoracic spine without extraosseous extension or pathologic fracture. 2. Progressive moderate foraminal narrowing at L3-4, right greater than left. 3. Progressive moderate subarticular and foraminal stenosis at L4-5, worse on the  right. 4. Progressive moderate right and mild left foraminal stenosis at L5-S1, similar to prior study. Electronically signed by: Lonni Necessary MD 03/27/2024 05:06 PM EDT RP Workstation: [id number]

## 2024-04-16 NOTE — Plan of Care (Signed)
  Problem: Elimination: Goal: Will not experience complications related to urinary retention Outcome: Progressing   Problem: Pain Managment: Goal: General experience of comfort will improve and/or be controlled Outcome: Progressing   Problem: Safety: Goal: Ability to remain free from injury will improve Outcome: Progressing

## 2024-04-16 NOTE — Evaluation (Signed)
 Physical Therapy Evaluation Patient Details Name: David Townsend MRN: [id number] DOB: 1960/08/01 Today's Date: 04/16/2024  History of Present Illness  Pt is a 63 y.o. male presenting to [facility] ED for cancer-related pain and  multiple falls. Recently in  for same , DC 11/7. PMH significant for prostate cancer with spinal metastases, rib fx, anemia of chronic disease, GERD, HTN, HLD, cancer-related pain, tobacco use disorder and alcohol use disorder. Imaging shows progression of diffuse bone metastasis to skull, ribs, spine, pelvis, humerus, and femurs.  Clinical Impression  Pt admitted with above diagnosis.  Pt currently with functional limitations due to the deficits listed below (see PT Problem List). Pt will benefit from acute skilled PT to increase their independence and safety with mobility to allow discharge.     Patient known to  this PT from  Ed visit last week. Patient is more confused, tardive movements( medication effects). Patient did tolerate mobility much better and  able to stand and step to recliner. Patient clearly will require 24/7 caregivers. Patient will benefit from continued inpatient follow up therapy, <3 hours/day.       If plan is discharge home, recommend the following: Two people to help with walking and/or transfers;A lot of help with bathing/dressing/bathroom;Assist for transportation;Help with stairs or ramp for entrance   Can travel by private vehicle   No    Equipment Recommendations None recommended by PT  Recommendations for Other Services       Functional Status Assessment Patient has had a recent decline in their functional status and/or demonstrates limited ability to make significant improvements in function in a reasonable and predictable amount of time     Precautions / Restrictions Precautions Precautions: Fall Recall of Precautions/Restrictions: Impaired Required Braces or Orthoses: Other Brace Other Brace: TLSO was prescribed last  visit Restrictions Weight Bearing Restrictions Per Provider Order: No      Mobility  Bed Mobility   Bed Mobility: Supine to Sit     Supine to sit: Supervision, HOB elevated     General bed mobility comments: extra time, cues to stay on task    Transfers   Equipment used: Rolling walker (2 wheels) Transfers: Sit to/from Stand, Bed to chair/wheelchair/BSC Sit to Stand: Min assist, +2 safety/equipment   Step pivot transfers: Min assist, +2 safety/equipment       General transfer comment: cues for hand placement, increased time, assist to steady and guide walker    Ambulation/Gait                  Stairs            Wheelchair Mobility     Tilt Bed    Modified Rankin (Stroke Patients Only)       Balance Overall balance assessment: Needs assistance   Sitting balance-Leahy Scale: Fair     Standing balance support: Bilateral upper extremity supported, During functional activity Standing balance-Leahy Scale: Poor Standing balance comment: reliant on RW                             Pertinent Vitals/Pain Pain Assessment Pain Assessment: No/denies pain Pain Score: 7  Pain Location: back Pain Descriptors / Indicators: Discomfort Pain Intervention(s): Monitored during session    Home Living Family/patient expects to be discharged to:: Private residence Living Arrangements: Alone   Type of Home: House Home Access: Stairs to enter Entrance Stairs-Rails: Doctor, General Practice of Steps: 4   Home Layout: One level  Home Equipment: Agricultural Consultant (2 wheels)      Prior Function Prior Level of Function : History of Falls (last six months);Driving             Mobility Comments: was  ambulating  w/ no device per pt (unsure of reliability) ADLs Comments: independent, 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     Extremity/Trunk Assessment   Upper  Extremity Assessment Upper Extremity Assessment: Generalized weakness;Right hand dominant;RUE deficits/detail;LUE deficits/detail RUE Coordination: decreased fine motor;decreased gross motor LUE Coordination: decreased fine motor;decreased gross motor    Lower Extremity Assessment Lower Extremity Assessment: Generalized weakness    Cervical / Trunk Assessment Cervical / Trunk Assessment: Other exceptions Cervical / Trunk Exceptions: spinal mets, reports 7/10 pain  Communication   Communication Communication: No apparent difficulties    Cognition Arousal: Lethargic Behavior During Therapy: Flat affect   PT - Cognitive impairments: Orientation, Initiation   Orientation impairments: Time, Situation                   PT - Cognition Comments: slow to  initiate, states he  is out of it, dozing in mid drinking from cup and looking at phone Following commands: Impaired Following commands impaired: Follows one step commands with increased time     Cueing Cueing Techniques: Verbal cues, Gestural cues     General Comments      Exercises     Assessment/Plan    PT Assessment Patient needs continued PT services  PT Problem List Decreased strength;Decreased range of motion;Decreased activity tolerance;Decreased balance;Decreased mobility;Pain       PT Treatment Interventions DME instruction;Functional mobility training;Therapeutic activities;Therapeutic exercise;Patient/family education    PT Goals (Current goals can be found in the Care Plan section)  Acute Rehab PT Goals PT Goal Formulation: Patient unable to participate in goal setting Time For Goal Achievement: 04/30/24 Potential to Achieve Goals: Fair    Frequency Min 2X/week     Co-evaluation PT/OT/SLP Co-Evaluation/Treatment: Yes Reason for Co-Treatment: Complexity of the patient's impairments (multi-system involvement);Necessary to address cognition/behavior during functional activity;For patient/therapist  safety PT goals addressed during session: Mobility/safety with mobility OT goals addressed during session: ADL's and self-care       AM-PAC PT 6 Clicks Mobility  Outcome Measure Help needed turning from your back to your side while in a flat bed without using bedrails?: A Little Help needed moving from lying on your back to sitting on the side of a flat bed without using bedrails?: A Little Help needed moving to and from a bed to a chair (including a wheelchair)?: A Lot Help needed standing up from a chair using your arms (e.g., wheelchair or bedside chair)?: A Lot Help needed to walk in hospital room?: Total Help needed climbing 3-5 steps with a railing? : Total 6 Click Score: 12    End of Session Equipment Utilized During Treatment: Gait belt Activity Tolerance: Patient tolerated treatment well Patient left: in chair;with call bell/phone within reach;with chair alarm set;with nursing/sitter in room Nurse Communication: Mobility status;Precautions;Other (comment) PT Visit Diagnosis: Unsteadiness on feet (R26.81);Pain    Time: 9163-9094 PT Time Calculation (min) (ACUTE ONLY): 29 min   Charges:   PT Evaluation $PT Eval Low Complexity: 1 Low   PT General Charges $$ ACUTE PT VISIT: 1 Visit         Darice Potters PT Acute Rehabilitation Services Office [phone]   Phelps,  Darice Norris 04/16/2024, 2:18 PM

## 2024-04-16 NOTE — Evaluation (Signed)
 Occupational Therapy Evaluation Patient Details Name: David Townsend MRN: [id number] DOB: March 31, 1961 Today's Date: 04/16/2024   History of Present Illness   Pt is a 63 y.o. male presenting to [facility] ED for cancer-related pain and  multiple falls. Recently in  for same , DC 11/7. PMH significant for prostate cancer with spinal metastases, rib fx, anemia of chronic disease, GERD, HTN, HLD, cancer-related pain, tobacco use disorder and alcohol use disorder. Imaging shows progression of diffuse bone metastasis to skull, ribs, spine, pelvis, humerus, and femurs.     Clinical Impressions Pt reports independence, driving and playing golf at his baseline. He lives alone. He does not appear to be aware of the extent of his metastatic disease. He is oriented to himself, place and month and follows commands with increased time. Pt presents with intermittent lethargy, back pain, generalized weakness and decreased standing balance. He requires min assist to transfer, recommend +2 assist to attempt ambulation. He require min to total assist for ADLs. Patient will benefit from continued inpatient follow up therapy, <3 hours/day.      If plan is discharge home, recommend the following:   Two people to help with walking and/or transfers;A lot of help with bathing/dressing/bathroom;Assistance with cooking/housework;Assistance with feeding;Direct supervision/assist for medications management;Direct supervision/assist for financial management;Assist for transportation;Help with stairs or ramp for entrance     Functional Status Assessment   Patient has had a recent decline in their functional status and/or demonstrates limited ability to make significant improvements in function in a reasonable and predictable amount of time     Equipment Recommendations   Other (comment) (defer to next venue of care)     Recommendations for Other Services         Precautions/Restrictions    Precautions Precautions: Fall Recall of Precautions/Restrictions: Impaired Restrictions Weight Bearing Restrictions Per Provider Order: No     Mobility Bed Mobility Overal bed mobility: Needs Assistance Bed Mobility: Supine to Sit     Supine to sit: Supervision, HOB elevated     General bed mobility comments: pt came to long sitting with HOB up and pivoted on his buttocks to EOB without physical assist    Transfers Overall transfer level: Needs assistance Equipment used: Rolling walker (2 wheels) Transfers: Sit to/from Stand, Bed to chair/wheelchair/BSC Sit to Stand: Min assist, +2 safety/equipment     Step pivot transfers: Min assist, +2 safety/equipment     General transfer comment: cues for hand placement, increased time, assist to steady and guide walker      Balance Overall balance assessment: Needs assistance   Sitting balance-Leahy Scale: Fair       Standing balance-Leahy Scale: Poor                             ADL either performed or assessed with clinical judgement   ADL Overall ADL's : Needs assistance/impaired Eating/Feeding: Minimal assistance;Sitting   Grooming: Wash/dry hands;Wash/dry face;Oral care;Sitting;Minimal assistance   Upper Body Bathing: Maximal assistance;Sitting   Lower Body Bathing: Total assistance;Sit to/from stand   Upper Body Dressing : Maximal assistance;Sitting   Lower Body Dressing: Total assistance;Bed level   Toilet Transfer: Minimal assistance;Rolling walker (2 wheels);Stand-pivot   Toileting- Architect and Hygiene: Total assistance;Sit to/from stand       Functional mobility during ADLs: Minimal assistance;+2 for safety/equipment;Rolling walker (2 wheels)       Vision Baseline Vision/History: 1 Wears glasses Ability to See in Adequate Light: 0 Adequate Patient  Visual Report: No change from baseline Additional Comments: readers     Perception Perception: Impaired   Perception-Other  Comments: thought door knob was a flask, difficulty finding glasses  on table   Praxis         Pertinent Vitals/Pain Pain Assessment Pain Assessment: 0-10 Pain Score: 7  Pain Location: back Pain Descriptors / Indicators: Discomfort Pain Intervention(s): Monitored during session, Repositioned     Extremity/Trunk Assessment Upper Extremity Assessment Upper Extremity Assessment: Generalized weakness;Right hand dominant;RUE deficits/detail;LUE deficits/detail RUE Coordination: decreased fine motor;decreased gross motor LUE Coordination: decreased fine motor;decreased gross motor       Cervical / Trunk Assessment Cervical / Trunk Assessment: Other exceptions Cervical / Trunk Exceptions: spinal mets, reports 7/10 pain   Communication Communication Communication: No apparent difficulties   Cognition Arousal: Lethargic Behavior During Therapy: Flat affect Cognition: Cognition impaired   Orientation impairments: Time, Situation Awareness: Intellectual awareness impaired, Online awareness impaired Memory impairment (select all impairments): Working civil service fast streamer, Conservation officer, historic buildings, Short-term memory Attention impairment (select first level of impairment): Sustained attention Executive functioning impairment (select all impairments): Initiation, Sequencing, Reasoning, Problem solving OT - Cognition Comments: pt not aware of the extent of his mets                 Following commands: Impaired Following commands impaired: Follows one step commands with increased time     Cueing  General Comments   Cueing Techniques: Verbal cues;Gestural cues      Exercises     Shoulder Instructions      Home Living Family/patient expects to be discharged to:: Private residence Living Arrangements: Alone   Type of Home: House Home Access: Stairs to enter Secretary/administrator of Steps: 4 Entrance Stairs-Rails: Right;Left Home Layout: One level     Bathroom Shower/Tub:  Chief Strategy Officer: Standard     Home Equipment: Agricultural Consultant (2 wheels)          Prior Functioning/Environment Prior Level of Function : History of Falls (last six months);Driving             Mobility Comments: was  ambulating  w/ no device per pt (unsure of reliability) ADLs Comments: independent, 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    OT Problem List: Pain;Decreased coordination;Impaired balance (sitting and/or standing);Decreased activity tolerance;Decreased range of motion;Decreased strength;Decreased knowledge of use of DME or AE;Decreased knowledge of precautions   OT Treatment/Interventions: Self-care/ADL training;DME and/or AE instruction;Therapeutic activities;Cognitive remediation/compensation;Patient/family education;Balance training      OT Goals(Current goals can be found in the care plan section)   Acute Rehab OT Goals OT Goal Formulation: Patient unable to participate in goal setting Time For Goal Achievement: 04/30/24 Potential to Achieve Goals: Fair ADL Goals Pt Will Perform Grooming: standing;with min assist Pt Will Perform Upper Body Bathing: with min assist;sitting Pt Will Perform Upper Body Dressing: with supervision;sitting Pt Will Transfer to Toilet: with min assist;ambulating Pt Will Perform Toileting - Clothing Manipulation and hygiene: with supervision;sit to/from stand Additional ADL Goal #1: Pt will adhere to back precautions for comfort during mobility and ADLs.   OT Frequency:  Min 2X/week    Co-evaluation PT/OT/SLP Co-Evaluation/Treatment: Yes Reason for Co-Treatment: Complexity of the patient's impairments (multi-system involvement);Necessary to address cognition/behavior during functional activity;For patient/therapist safety PT goals addressed during session: Mobility/safety with mobility OT goals addressed during session: ADL's and  self-care  AM-PAC OT 6 Clicks Daily Activity     Outcome Measure Help from another person eating meals?: A Little Help from another person taking care of personal grooming?: A Little Help from another person toileting, which includes using toliet, bedpan, or urinal?: Total Help from another person bathing (including washing, rinsing, drying)?: A Lot Help from another person to put on and taking off regular upper body clothing?: A Lot Help from another person to put on and taking off regular lower body clothing?: Total 6 Click Score: 12   End of Session Equipment Utilized During Treatment: Gait belt;Rolling walker (2 wheels) Nurse Communication: Mobility status  Activity Tolerance: Patient tolerated treatment well Patient left: in chair;with call bell/phone within reach;with nursing/sitter in room  OT Visit Diagnosis: Pain;Unsteadiness on feet (R26.81);Muscle weakness (generalized) (M62.81);Other abnormalities of gait and mobility (R26.89);Other symptoms and signs involving cognitive function                Time: 9162-9095 OT Time Calculation (min): 27 min Charges:  OT General Charges $OT Visit: 1 Visit OT Evaluation $OT Eval Moderate Complexity: 1 Mod  Mliss HERO, OTR/L Acute Rehabilitation Services Office: [phone]   Kennth Mliss Helling 04/16/2024, 10:54 AM

## 2024-04-17 ENCOUNTER — Ambulatory Visit

## 2024-04-17 ENCOUNTER — Other Ambulatory Visit: Payer: Self-pay

## 2024-04-17 DIAGNOSIS — C7951 Secondary malignant neoplasm of bone: Secondary | ICD-10-CM | POA: Diagnosis not present

## 2024-04-17 DIAGNOSIS — R296 Repeated falls: Secondary | ICD-10-CM | POA: Diagnosis not present

## 2024-04-17 DIAGNOSIS — Z7189 Other specified counseling: Secondary | ICD-10-CM | POA: Diagnosis not present

## 2024-04-17 DIAGNOSIS — R531 Weakness: Secondary | ICD-10-CM | POA: Diagnosis not present

## 2024-04-17 LAB — BASIC METABOLIC PANEL WITH GFR
Anion gap: 13 (ref 5–15)
BUN: 11 mg/dL (ref 8–23)
CO2: 26 mmol/L (ref 22–32)
Calcium: 9.8 mg/dL (ref 8.9–10.3)
Chloride: 97 mmol/L — ABNORMAL LOW (ref 98–111)
Creatinine, Ser: 0.61 mg/dL (ref 0.61–1.24)
GFR, Estimated: >60 mL/min (ref >60)
Glucose, Bld: 87 mg/dL (ref 70–99)
Potassium: 3.7 mmol/L (ref 3.5–5.1)
Sodium: 136 mmol/L (ref 135–145)

## 2024-04-17 LAB — RAD ONC ARIA SESSION SUMMARY
Course Elapsed Days: 7
Plan Fractions Treated to Date: 3
Plan Prescribed Dose Per Fraction: 2.889 Gy
Plan Total Fractions Prescribed: 9
Plan Total Prescribed Dose: 26.001 Gy
Reference Point Dosage Given to Date: 8.667 Gy
Reference Point Session Dosage Given: 2.889 Gy
Session Number: 4

## 2024-04-17 MED ORDER — SODIUM CHLORIDE 0.9 % IV SOLN
1.0000 g | Freq: Once | INTRAVENOUS | Status: AC
  Administered 2024-04-17: 1 g via INTRAVENOUS
  Filled 2024-04-17: qty 10

## 2024-04-17 MED ORDER — DEXAMETHASONE 4 MG PO TABS
4.0000 mg | ORAL_TABLET | Freq: Two times a day (BID) | ORAL | Status: DC
  Administered 2024-04-17 – 2024-04-27 (×21): 4 mg via ORAL
  Filled 2024-04-17 (×21): qty 1

## 2024-04-17 NOTE — Assessment & Plan Note (Signed)
 Continue palliative radiation Recommend him to ask as needed medication for pain control before radiation. Appreciate hospital medicine and palliative care support

## 2024-04-17 NOTE — Assessment & Plan Note (Signed)
 Continue to work with rehab

## 2024-04-17 NOTE — Progress Notes (Signed)
  Progress Note   Patient: David Townsend [name] DOB: 03-06-1961 DOA: 04/13/2024     4 DOS: the patient was seen and examined on 04/17/2024 at 9:30 AM      Brief hospital course: 63 y.o. M with castration resistant metastatic prostate cancer, HTN, and alcohol use who presented with falls.  Developed acute metabolic cephalopathy in the hospital       Assessment and Plan: Acute metabolic encephalopathy  Etiology of this is not entirely clear.  Timing of onset suggested is not from morphine , or Ativan , or other medications.  Possibly alcohol withdrawal.  MRI brain still pending - Obtain MRI brain with and without contrast - Wean dexamethasone     intractable pain of malignancy Prostate cancer metastasis to bone, progressive -Consult radiation oncology, medical oncology, and urology - Continue apalutamide , Decadron  - Continue morphine  - Consult palliative care   Community-acquired versus atypical pneumonia This seems resolved - Continue Rocephin  and azithromycin, day 5 of 5     HTN Blood pressure controlled - Continue amlodipine , furosemide , irbesartan    HLD -Hold Lipitor      Transaminitis - Hold statin           Subjective: Patient is more alert today.  He has had no fever, no confusion, although he still has some memory loss, no respiratory symptoms.     Physical Exam: BP 112/66 (BP Location: Left Arm)   Pulse 95   Temp 98.4 F (36.9 C) (Oral)   Resp 20   Ht 5' 9 (1.753 m)   Wt 67.9 kg   SpO2 94%   BMI 22.09 kg/m   Thin adult male, lying in bed, interactive and appropriate RRR, no murmurs, no peripheral edema Respiratory normal, lungs clear without rales or wheezes Abdomen soft no tenderness palpation or guarding, no ascites or distention He is attentive to questions, responsive, he seems to have some short-term memory loss, and processing difficulties, although psychomotor slowing is resolved, and he is oriented to person, place, and  time, motor strength is symmetric, face symmetric, speech clear    Data Reviewed: Discussed with oncology Basic metabolic panel normal      Family Communication: Caregiver at the bedside    Disposition: Status is: Inpatient         Author: Lonni SHAUNNA Dalton, MD 04/17/2024 5:44 PM  For on call review [email or web address].

## 2024-04-17 NOTE — Progress Notes (Addendum)
 David Townsend   DOB:11-Apr-1961   FM#:[id number]    ASSESSMENT & PLAN:   Patient is 63 year old male with history of prostate cancer with metastasis to bone, anemia of chronic medical disease, HTN, HLD, GERD, pain of malignancy with chronic opioid dependence,, daily tobacco/alcohol use disorder who presented with multiple complaints including generalized weakness, frequent falls at home. Per chart review he was discharged on 11/7, with findings of progressive disease from his prostate cancer. He was started on palliative radiation.   Patient is alert and responding to questioning appropriately today.  He is going through palliative radiation.  DPOA David Townsend is at the bedside.  We discussed metastatic prostate cancer and its progression.  We discussed metastatic prostate cancer is not considered curable.  He has passed a PI with ADT in progress on visit with worsening metastases.  We discussed Treatment will include IV chemotherapy, radioligand, or other therapy depending on additional NGS, or mutation testing.  Patient will need outpatient PSMA PET/CT to determine if he is candidate for Pluvicto radioligand therapy.  We discussed potential side effects associated with future treatment of chemotherapy, and radiotherapy.  Discussed active drinking, and history of cirrhosis may result in increased risk of complications, and reduce the effects of prostate cancer treatment.  If he would like to consider systemic treatment, he need to decrease alcohol intake, and preferably cessation before worsening liver function.  Also recommend to stop smoking.  He said he would like to receive IV treatment if possible.  Ask about his current radiation.  Discussed that this is to treat his acute pain and metastases.  Hoping this will reduce his pain and cancer control.  Planning is going to rehab.  Discussed that if he has increased performance status, may return for follow-up, and we will plan on outpatient PSMA PET/CT,  follow-up in the clinic afterwards.  Discussed goals of care.  We discussed the alternative will be to consider hospice and comfort measures.  Patient expressed he does not want to continue talking today and that is enough today.  We will follow-up again next week. Assessment & Plan Prostate cancer [facility]) Will continue to finish palliative radiation Recommend outpatient PSMA PET to assess eligibility for Pluvicto Follow-up after PET scan Continue palliative care follow-up as outpatient Weakness Continue to work with rehab Metastatic carcinoma to bone David Townsend) Continue palliative radiation Recommend him to ask as needed medication for pain control before radiation. Appreciate [facility] medicine and palliative care support  All questions were answered.    Thank you for the consult. Will follow with you.  David JAYSON Chihuahua, MD 04/17/2024 3:26 PM  Subjective:  David Townsend reports feeling more alert and better today.  DPOA is at bedside.  Report pain from the rib.  Pain is under control.  Going to radiation his heart having to be on the heart board.  Objective:  Vitals:   04/17/24 0459 04/17/24 1341  BP: (!) 153/84 112/66  Pulse: 83 95  Resp: 18 20  Temp: 97.8 F (36.6 C) 98.4 F (36.9 C)  SpO2: 94% 94%     Intake/Output Summary (Last 24 hours) at 04/17/2024 1526 Last data filed at 04/17/2024 0400 Gross per 24 hour  Intake 100 ml  Output 1300 ml  Net -1200 ml    GENERAL: alert, no distress and comfortable SKIN: skin color normal LUNGS: No wheeze, rales and clear to auscultation bilaterally with normal breathing effort.  HEART: regular rate & rhythm  ABDOMEN: abdomen soft, non-tender and non-distended Musculoskeletal: no lower extremity  edema NEURO: alert with fluent speech, no focal motor/sensory deficits   Labs:  Recent Labs    11/28/23 1957 03/27/24 1438 04/14/24 1107 04/15/24 0626 04/16/24 0700 04/17/24 0709  NA  --    < > 135 138 139 136  K  --    < > 3.9 3.7 3.2* 3.7   CL  --    < > 98 103 101 97*  CO2  --    < > 21* 21* 27 26  GLUCOSE  --    < > 133* 118* 108* 87  BUN  --    < > 13 18 13 11   CREATININE  --    < > 0.67 0.65 0.55* 0.61  CALCIUM   --    < > 10.4* 9.6 9.9 9.8  GFRNONAA  --    < > >60 >60 >60 >60  PROT 8.6*   < > 7.3 6.5 6.4*  --   ALBUMIN 5.2*   < > 3.6 3.5 3.5  --   AST 29   < > 75* 93* 46*  --   ALT 27   < > 50* 64* 58*  --   ALKPHOS 147*   < > 193* 174* 165*  --   BILITOT 1.2   < > 0.4 0.3 0.3  --   BILIDIR 0.1  --   --   --   --   --   IBILI 1.1*  --   --   --   --   --    < > = values in this interval not displayed.    Studies:  MR CERVICAL SPINE W WO CONTRAST Result Date: 04/14/2024 EXAM: MRI CERVICAL SPINE WITH AND WITHOUT CONTRAST 04/14/2024 02:57:42 PM TECHNIQUE: Multiplanar multisequence MRI of the cervical spine was performed without and with the administration of intravenous contrast. 8 mL (gadobutrol (GADAVIST) 1 MMOL/ML injection 8 mL GADOBUTROL 1 MMOL/ML IV SOLN). COMPARISON: MRI thoracic spine 04/14/2024 and 04/08/2024. No prior cervical spine imaging. CLINICAL HISTORY: Metastatic disease evaluation. FINDINGS: BONES AND ALIGNMENT: Mild reversal of the normal cervical lordosis. No significant listhesis. Preserved cervical vertebral body heights without a definite fracture identified. Widespread abnormal bone marrow signal consistent with known metastatic disease, most notable throughout the mid and lower cervical and included upper thoracic spine with skull base involvement noted as well (left occipital condyle). Prominent involvement of the right sided posterior elements at C5. SPINAL CORD: Normal spinal cord size. Normal spinal cord signal. SOFT TISSUES: No paraspinal mass. Mildly prominent smooth dorsal epidural enhancement in a bilateral and symmetric fashion throughout the cervical spine as well as slightly more prominent dorsal and ventral epidural enhancement in the included upper thoracic spine. No masslike epidural  enhancement. DISC LEVELS: C2-C3: Mild facet arthrosis without disc herniation or stenosis. C3-C4: Severe disc space narrowing. A broad based posterior disc osteophyte complex and mild facet arthrosis result in mild left sided spinal stenosis and mild to moderate right and severe left neural foraminal stenosis. C4-C5: Severe disc space narrowing. A broad based posterior disc osteophyte complex and mild facet arthrosis result in mild spinal stenosis and severe right and moderate left neural foraminal stenosis. C5-C6: Moderate disc space narrowing. Disc bulging, uncovertebral spurring, and mild facet arthrosis result in mild spinal stenosis and severe bilateral neural foraminal stenosis. C6-C7: Severe disc space narrowing. A broad based posterior disc osteophyte complex and mild facet arthrosis result in mild spinal stenosis and severe bilateral neural foraminal stenosis. C7-T1: Severe facet arthrosis results in  borderline to mild right and mild to moderate left neural foraminal stenosis without spinal stenosis. IMPRESSION: 1. Osseous metastatic disease throughout the cervical and including upper thoracic spine as well as involving the skull base. 2. Mildly prominent dorsal epidural enhancement throughout the cervical spine as well as ventral and dorsal epidural enhancement in the included upper thoracic spine. This is favored to reflect prominent epidural venous plexus, although small volume epidural tumor is not excluded. 3. Widespread disc degeneration with mild multilevel spinal stenosis and severe neural foraminal stenosis as above. Electronically signed by: Dasie Hamburg MD 04/14/2024 04:56 PM EST RP Workstation: [id number]   MR THORACIC SPINE W WO CONTRAST Result Date: 04/14/2024 EXAM: MRI THORACIC SPINE WITHOUT AND WITH INTRAVENOUS CONTRAST 04/14/2024 02:57:42 PM TECHNIQUE: Multiplanar multisequence MRI of the thoracic spine was performed without and with the administration of 8 mL of gadobutrol (GADAVIST) 1  MMOL/ML intravenous contrast. COMPARISON: MRI thoracic spine 04/08/2024. CLINICAL HISTORY: Myelopathy, acute, thoracic spine. FINDINGS: LIMITATIONS: The examination is motion degraded, including moderate to severe motion on the axial T2 gradient echo sequence and mild to moderate motion on other sequences. BONES AND ALIGNMENT: Normal alignment. Extensive, widespread metastatic disease throughout the vertebral bodies and posterior elements of the thoracic spine has not significantly changed from the recent prior MRI. Multiple compression fractures are again seen as detailed on the prior MRI without evidence of interval progression, and this includes a T8 fracture with 30% height loss and mild retropulsion. There is also mild retropulsion at T4 and T6. Assessment for marrow edema is again limited due to widespread marrow signal abnormality. Mildly prominent ventral epidural enhancement throughout the upper and mid thoracic spine is similar to the prior MRI and again favored to predominantly reflect prominent epidural venous plexus, although small volume epidural tumor is difficult to exclude. No masslike epidural enhancement is present. SPINAL CORD: Questionable mild hazy T2/STIR hyperintensity throughout the mid thoracic spinal cord, although this is most conspicuous on the sagittal STIR sequence and is not clearly confirmed on the standard sagittal and axial T2 sequences and may be artifactual. SOFT TISSUES: Dorsal epidural lipomatosis is again noted and results in spinal stenosis with essentially complete CSF effacement from T3-T8 with mild mass effect on the spinal cord as before. IMPRESSION: 1. Unchanged appearance of widespread metastatic disease in the thoracic spine with multiple nonprogressive pathologic compression fractures. 2. Unchanged mildly prominent epidural enhancement in the upper and mid thoracic spine, again favored to predominantly reflect prominent epidural venous plexus with small volume  epidural tumor not excluded. No mass-like epidural enhancement. 3. Unchanged severe spinal stenosis with CSF effacement from T3-T8 predominantly due to epidural lipomatosis. Low level spinal cord edema is not excluded. Electronically signed by: Dasie Hamburg MD 04/14/2024 04:37 PM EST RP Workstation: [id number]   DG CHEST PORT 1 VIEW Result Date: 04/14/2024 CLINICAL DATA:  Pneumonia . EXAM: PORTABLE CHEST 1 VIEW COMPARISON:  04/13/2024 FINDINGS: The cardio pericardial silhouette is enlarged. Diffuse interstitial and patchy airspace disease again noted with progression of airspace disease in the left apex and right mid and lower lung. No substantial pleural effusion. The cardio pericardial silhouette is enlarged. Telemetry leads overlie the chest. Gaseous distention of colon noted in the visualized upper abdomen. IMPRESSION: Diffuse interstitial and patchy airspace disease with progression of airspace disease in the left apex and right mid and lower lung. Electronically Signed   By: Camellia Candle M.D.   On: 04/14/2024 07:45   DG Chest Port 1 View Result Date: 04/13/2024 CLINICAL DATA:  Shortness of breath, chest pain and back pain. Fall. EXAM: PORTABLE CHEST 1 VIEW COMPARISON:  04/10/2024 and CT chest 11/27/2023. FINDINGS: Trachea is midline. Heart size within normal limits. Diffuse hazy airspace opacification, somewhat upper and midlung zone predominant and progressive from 04/10/2024. No pleural effusions. IMPRESSION: Increasing hazy upper and midlung zone predominant airspace opacification may be due to a viral or atypical pneumonia. Electronically Signed   By: Newell Eke M.D.   On: 04/13/2024 12:53   DG CHEST PORT 1 VIEW Result Date: 04/10/2024 EXAM: 1 VIEW(S) XRAY OF THE CHEST 04/10/2024 07:07:00 AM COMPARISON: 11/26/2023 CLINICAL HISTORY: Dyspnea FINDINGS: LUNGS AND PLEURA: Low lung volumes. Bilateral ill defined peripheral and basilar airspace opacities. Mild predominantly perihilar interstitial  prominence. Possible small left pleural effusion. No pulmonary edema. No pneumothorax. HEART AND MEDIASTINUM: No acute abnormality of the cardiac and mediastinal silhouettes. BONES AND SOFT TISSUES: No acute osseous abnormality. IMPRESSION: 1. Peripheral and basilar airspace opacities with mild predominantly perihilar interstitial prominence, compatible with multifocal infection or inflammation. 2. Low lung volumes. 3. Possible small left pleural effusion. Electronically signed by: Katheleen Faes MD 04/10/2024 11:13 AM EST RP Workstation: [id number]   MR THORACIC SPINE W WO CONTRAST Result Date: 04/08/2024 EXAM: MRI THORACIC SPINE WITH AND WITHOUT INTRAVENOUS CONTRAST 04/08/2024 03:43:35 PM TECHNIQUE: Multiplanar multisequence MRI of the thoracic spine was performed with and without the administration of intravenous contrast. COMPARISON: MRI thoracic spine 09/19/2022. MRI lumbar spine 03/27/2024. CTA chest, abdomen, and pelvis 11/27/2023. CLINICAL HISTORY: Metastatic disease evaluation. Severe back pain. History of metastatic prostate cancer. FINDINGS: BONES AND ALIGNMENT: There is widespread metastatic disease involving every level in the thoracic spine which has greatly progressed from the prior thoracic spine MRI. There is complete or near complete marrow replacement in the T2 through T8 vertebral bodies with large lesions noted at T11 and T12 as well. Posterior element involvement is particularly extensive from T4 to T6. Multiple compression fractures are present. A T4 compression fracture is new from the prior thoracic MRI but was present on the interval chest CT with mildly progressive vertebral body height loss, now measuring 25%. A mild T3 compression fracture is new or progressive, with 10% vertebral body height loss. There are new T5 and T6 compression fractures with 20% height loss each. A T8 compression fracture is new or progressive with 30% height loss. Assessment for marrow edema associated with  these fractures is limited due to the underlying widespread marrow signal abnormality; however, there is a visible unhealed fracture line within the T8 vertebral body extending to the posterior cortex. T8 retropulsion measures 2 to 3 mm. Mildly prominent epidural enhancement, most prominent ventrally from T5 to T8, is favored to reflect prominent epidural venous plexus without masslike enhancement seen to clearly indicate epidural tumor, although minimal epidural tumor in this region is not excluded. SPINAL CORD: There is apparent mild hyperintensity throughout the majority of the thoracic spinal cord on the sagittal STIR sequence which is not clearly confirmed on the standard sagittal T2 sequence. Assessment of the spinal cord on axial images is limited due to severe motion artifact. SOFT TISSUES: Dorsal epidural lipomatosis has increased from the prior thoracic spine MRI and results in spinal stenosis with essentially complete CSF effacement from T3 to T8. There is mild mass effect on the spinal cord in the region from T3 to T8. IMPRESSION: 1. Progressive, widespread metastatic disease throughout the thoracic spine. 2. Multiple new or progressive pathologic compression fractures, most notably at T8 where there is an unhealed  fracture with 30% height loss and mild retropulsion. 3. 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 Mild spinal cord edema is not excluded with assessment limited by motion. Electronically signed by: Dasie Hamburg MD 04/08/2024 04:17 PM EST RP Workstation: [id number]   MR Lumbar Spine W Wo Contrast Result Date: 03/27/2024 EXAM: MR Lumbar Spine with and without intravenous contrast. 03/27/2024 04:42:52 PM TECHNIQUE: Multiplanar multisequence MRI of the lumbar spine was performed with and without the administration of intravenous contrast. COMPARISON: MRI of the lumbar spine 17/24. CLINICAL HISTORY:  Metastatic disease evaluation. Chronic back pain. History of prostate cancer. Recent PET scan shown metastases. Seen at [facility] and given oxy but no relief. FINDINGS: BONES AND ALIGNMENT: Progressive metastases are present throughout the lumbar and visualized lower thoracic spine. Diffuse enhancement is present throughout the L2 vertebral body. A 15 mm lesion is present along the posterior inferior L2 vertebral body. A lesion anteriorly on the left at L4 measures up to 21 mm. Enhancing lesion extends through the left pedicle of L2 and into the posterior elements. A left lateral lesion at T12 extends into the left pedicle measuring 3.4 cm. No extraosseous extension of metastases is present. No pathologic fractures are present. Normal alignment. SPINAL CORD: The conus terminates normally. SOFT TISSUES: No acute abnormality. T12-L1: A left lateral lesion at T12 extends into the left pedicle measuring 3.4 cm. No disc herniation. No spinal canal stenosis or neural foraminal narrowing. L1-L2: No disc herniation. No spinal canal stenosis or neural foraminal narrowing. L2-L3: Diffuse enhancement is present throughout the L2 vertebral body. A 15 mm lesion is present along the posterior inferior L2 vertebral body. Enhancing lesion extends through the left pedicle of L2 and into the posterior elements. No disc herniation. No spinal canal stenosis or neural foraminal narrowing. L3-L4: A broad-based disc protrusion is present. Moderate foraminal narrowing has progressed bilaterally, right greater than left. L4-L5: A lesion anteriorly on the left at L4 measures up to 21 mm. A broad-based disc protrusion and moderate facet hypertrophy is present. Progressive moderate subarticular and foraminal stenosis is worse right than left. L5-S1: A rightward disc protrusion and moderate facet hypertrophy results in progressive moderate right and mild left foraminal stenosis similar to the prior study. IMPRESSION: 1. Progressive osseous metastases  throughout the lumbar and visualized lower thoracic spine without extraosseous extension or pathologic fracture. 2. Progressive moderate foraminal narrowing at L3-4, right greater than left. 3. Progressive moderate subarticular and foraminal stenosis at L4-5, worse on the right. 4. Progressive moderate right and mild left foraminal stenosis at L5-S1, similar to prior study. Electronically signed by: Lonni Necessary MD 03/27/2024 05:06 PM EDT RP Workstation: [id number]

## 2024-04-17 NOTE — TOC Progression Note (Addendum)
 Transition of Care [facility]) - Progression Note    Patient Details  Name: David Townsend MRN: [id number] Date of Birth: June 01, 1961  [facility] CM/SW Contact  Toy LITTIE Agar, RN Phone Number:[phone]  04/17/2024, 10:05 AM  Clinical Narrative:    CM has received message for MD for SNF referral for patient that is likely be medically ready when his MRI brain is completed today. CM to assess patient and request permission to initiate SNF workup.   1124 Cm at bedside to discuss disposition and SNF. Patient off unit. Will attempt to discuss with patient at another time.  1254 CM at bedside to discuss disposition planning. Patient is on phone. [name] at bedside requesting that CM return later to discuss.   1345 FL2 has been completed. PASRR [id number]   1409 CM at bedside introduced self and explained role and reason for referral. Patient verbalized understanding. CM has provided patient with medicare.gov listing of facilities for short term rehab. [facility]. FL2 has been faxed out for bed offers. Patient and HCPOA [name] have been updated.   1611 Currently no bed offers   Expected Discharge Plan: Skilled Nursing Facility Barriers to Discharge: Continued Medical Work up               Expected Discharge Plan and Services In-house Referral: NA Discharge Planning Services: CM Consult Post Acute Care Choice: NA Living arrangements for the past 2 months: Single Family Home                 DME Arranged: N/A DME Agency: NA       HH Arranged: NA HH Agency: NA         Social Drivers of Health (SDOH) Interventions SDOH Screenings   Food Insecurity: No Food Insecurity (04/13/2024)  Housing: Low Risk  (04/13/2024)  Transportation Needs: No Transportation Needs (04/13/2024)  Utilities: Not At Risk (04/13/2024)  Alcohol Screen: Medium Risk (12/04/2023)  Depression (PHQ2-9): Low Risk  (12/04/2023)  Tobacco Use: High Risk  (04/13/2024)    Readmission Risk Interventions    04/16/2024    1:08 PM  Readmission Risk Prevention Plan  Transportation Screening Complete  Medication Review (RN Care Manager) Referral to Pharmacy  PCP or Specialist appointment within 3-5 days of discharge Complete  HRI or Home Care Consult Complete  SW Recovery Care/Counseling Consult Complete  Palliative Care Screening Not Applicable  Skilled Nursing Facility Complete

## 2024-04-17 NOTE — Progress Notes (Signed)
 Daily Progress Note   Patient Name: David Townsend       Date: 04/17/2024 DOB: [birth year]  Age: 63 y.o. MRN#: [id number] Attending Physician: Jonel Lonni SQUIBB, * Primary Care Physician: Jaycee Greig PARAS, NP Admit Date: 04/13/2024  Reason for Consultation/Follow-up: Establishing goals of care  Subjective: Resting in bed, awake alert, HCPOA [name] at bedside, also discussed with TOC in the room     Length of Stay: 4  Current Medications: Scheduled Meds:   amLODipine   10 mg Oral Daily   apalutamide   240 mg Oral Daily   azithromycin  500 mg Oral Daily   Chlorhexidine  Gluconate Cloth  6 each Topical Daily   dexamethasone   4 mg Oral Q12H   enoxaparin  (LOVENOX ) injection  40 mg Subcutaneous Q24H   folic acid   1 mg Oral Daily   furosemide   20 mg Oral Daily   irbesartan   75 mg Oral Daily   morphine   60 mg Oral Q8H   multivitamin with minerals  1 tablet Oral Daily   polyethylene glycol  17 g Oral BID   potassium chloride   40 mEq Oral Daily   senna-docusate  2 tablet Oral BID   thiamine   100 mg Oral Daily   Or   thiamine   100 mg Intravenous Daily    Continuous Infusions:  cefTRIAXone  (ROCEPHIN )  IV 1 g (04/16/24 1421)    PRN Meds: haloperidol lactate, ibuprofen , methocarbamol, morphine , ondansetron  **OR** ondansetron  (ZOFRAN ) IV  Physical Exam         Resting in bed Awake alert No edema Regular work of breathing Appears with generalized weakness  Vital Signs: BP 112/66 (BP Location: Left Arm)   Pulse 95   Temp 98.4 F (36.9 C) (Oral)   Resp 20   Ht 5' 9 (1.753 m)   Wt 67.9 kg   SpO2 94%   BMI 22.09 kg/m  SpO2: SpO2: 94 % O2 Device: O2 Device: Nasal Cannula O2 Flow Rate: O2 Flow Rate (L/min): 3 L/min  Intake/output summary:  Intake/Output Summary (Last 24  hours) at 04/17/2024 1400 Last data filed at 04/17/2024 0400 Gross per 24 hour  Intake 100 ml  Output 1300 ml  Net -1200 ml   LBM: Last BM Date : 04/14/24 Baseline Weight: Weight: 67.9 kg Most recent weight: Weight: 67.9 kg  Palliative Assessment/Data:      Patient Active Problem List   Diagnosis Date Noted   Frequent falls 04/13/2024   Palliative care encounter 04/09/2024   Goals of care, counseling/discussion 04/09/2024   Medication management 04/09/2024   Counseling and coordination of care 04/09/2024   Secondary malignant neoplasm of bone (HCC) 04/09/2024   Metastatic carcinoma to bone (HCC) 04/09/2024   Chronic low back pain without sciatica 04/08/2024   Weakness 04/08/2024   Cancer-related pain 04/07/2024   Lobar pneumonia, unspecified organism 09/28/2022   ARDS (adult respiratory distress syndrome) (HCC) 09/28/2022   Rhabdomyolysis 09/19/2022   Hyponatremia 09/19/2022   Sepsis (HCC) 09/19/2022   Prostate cancer (HCC) 04/23/2022   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   Tobacco abuse 04/23/2022   Essential hypertension 04/23/2022   Urinary retention 04/23/2022   Hyperlipidemia 03/27/2022   ETOH abuse 11/01/2015    Palliative Care Assessment & Plan   Patient Profile:    Assessment: David Townsend is a 63 y.o. male with past medical history significant for prostate cancer with metastasis to bone, anemia of chronic medical disease, HTN, HLD, GERD, pain of malignancy with chronic opioid dependence,, tobacco/alcohol use disorder who presented to [facility] ED on 04/10/2024 with multiple complaints including generalized weakness, frequent falls at home.   Recently hospitalized and discharged on 04/10/2024 for uncontrolled cancer pain, pain management/palliative care was consulted, his pain regimen was adjusted on discharge. He started to take morphine  IR 30 mg every 3-4 hours for severe pain and morphine  ER 60 mg twice daily. He  was discharged home on Friday. Since Saturday, patient started develop generalized weakness, especially bilateral lower extremities, and sustained several falls at home. Gradually getting worse and re admitted to the hospital.   Recommendations/Plan: 11-11: Call placed and goals of care discussions held with HCPOA [name], patient's advance care planning documents are now scanned onto the chart and have been reviewed with HCPOA friend [name]. Patient has elected for full code status at this time, as per ACP documents that have been completed recently. Discussed with [name] about patient's MRI and current condition in detail, to the best of my ability.  Full code full scope care for now Recommend SNF rehab with palliative, patient lives alone and HCPOA friend [name] is available but doesn't live with the patient, [name] is worried about the patient's frequent falls and worries that he is not taking his medications as prescribed. Will recommend TOC consult.   11-12: Radiation oncology also following.  Recommend SNF rehab with palliative.  Continue to monitor symptoms.  Wean down decadron  IV 11-13: Call placed and discussed with HCPOA [name] after obtaining patient's permission. We discussed about patient's ongoing functional decline, again re visited code status discussions, we talked about elevated liver function tests, ultrasound from 2024 alluding to possibility of cirrhosis, his episodic confusion, recent generalized weakness and falls. Talked about appropriateness of DNR DNI at this point, she will reflect on this and discuss with patient. Full code for now. I told her about MRI brain which is ordered and pending. Updated [name] to the best of my ability.  11-14: Full code SNF rehab with palliative on discharge Recommend outpatient palliative appointment as well.  MRI brain pending.   Goals of Care and Additional Recommendations: Limitations on Scope of Treatment: Full Scope Treatment  Code  Status:    Code Status Orders  (From admission, onward)           Start  Ordered   04/13/24 1418  Full code  Continuous       Question:  By:  Answer:  Consent: discussion documented in EHR   04/13/24 1418           Code Status History     Date Active Date Inactive Code Status Order ID Comments User Context   04/07/2024 2244 04/10/2024 2246 Full Code [id number]  Lou Claretta HERO, MD ED   09/19/2022 2249 10/04/2022 2135 Full Code [id number]  Ricky Alfrieda DASEN, DO Inpatient       Prognosis:  Unable to determine  Discharge Planning: SNF rehab with palliative.   Care plan was discussed with  [name], patient and [facility] colleague Ms Toy.     Thank you for allowing the Palliative Medicine Team to assist in the care of this patient. High MDM.      Greater than 50%  of this time was spent counseling and coordinating care related to the above assessment and plan.  Lonia Serve, MD  Please contact Palliative Medicine Team phone at [phone] for questions and concerns.

## 2024-04-17 NOTE — [place] FL2 (Addendum)
 [place]  MEDICAID FL2 LEVEL OF CARE FORM     IDENTIFICATION  Patient Name: David Townsend Birthdate: 03-Apr-1961 Sex: male Admission Date (Current Location): 04/13/2024  [facility] and [place] Number:  Producer, Television/film/video and Address:  [facility],  [address], [place] [address]      Provider Number: [id number]  Attending Physician Name and Address:  Jonel Lonni SQUIBB, *  Relative Name and Phone Number:  [name] [phone]    Current Level of Care: Hospital Recommended Level of Care: Skilled Nursing Facility Prior Approval Number:    Date Approved/Denied:   PASRR Number: [id number]  Discharge Plan: SNF    Current Diagnoses: Patient Active Problem List   Diagnosis Date Noted   Frequent falls 04/13/2024   Palliative care encounter 04/09/2024   Goals of care, counseling/discussion 04/09/2024   Medication management 04/09/2024   Counseling and coordination of care 04/09/2024   Secondary malignant neoplasm of bone (HCC) 04/09/2024   Metastatic carcinoma to bone (HCC) 04/09/2024   Chronic low back pain without sciatica 04/08/2024   Weakness 04/08/2024   Cancer-related pain 04/07/2024   Lobar pneumonia, unspecified organism 09/28/2022   ARDS (adult respiratory distress syndrome) (HCC) 09/28/2022   Rhabdomyolysis 09/19/2022   Hyponatremia 09/19/2022   Sepsis (HCC) 09/19/2022   Prostate cancer (HCC) 04/23/2022   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   Tobacco abuse 04/23/2022   Essential hypertension 04/23/2022   Urinary retention 04/23/2022   Hyperlipidemia 03/27/2022   ETOH abuse 11/01/2015    Orientation RESPIRATION BLADDER Height & Weight     Self, Place  Normal Indwelling catheter Weight: 67.9 kg Height:  5' 9 (175.3 cm)  BEHAVIORAL SYMPTOMS/MOOD NEUROLOGICAL BOWEL NUTRITION STATUS     (n/a) Continent Diet (Heart Healthy)  AMBULATORY STATUS COMMUNICATION OF NEEDS Skin     Verbally  Normal                       Personal Care Assistance Level of Assistance  Bathing, Feeding, Dressing Bathing Assistance: Maximum assistance Feeding assistance: Limited assistance Dressing Assistance: Maximum assistance     Functional Limitations Info  Sight, Hearing, Speech Sight Info: Adequate Hearing Info: Adequate Speech Info: Adequate    SPECIAL CARE FACTORS FREQUENCY  PT (By licensed PT), OT (By licensed OT)     PT Frequency: 5x/wk OT Frequency: 5x/wk            Contractures      Additional Factors Info  Code Status, Allergies, Psychotropic, Insulin Sliding Scale, Isolation Precautions, Suctioning Needs Code Status Info: Full Allergies Info: No known drug allergies Psychotropic Info: see discharge summary Insulin Sliding Scale Info: see discharge summary Isolation Precautions Info: n/a Suctioning Needs: n/a   Current Medications (04/17/2024):  This is the current hospital active medication list Current Facility-Administered Medications  Medication Dose Route Frequency Provider Last Rate Last Admin   amLODipine  (NORVASC ) tablet 10 mg  10 mg Oral Daily Zhang, Ping T, MD   10 mg at 04/17/24 0841   apalutamide  (ERLEADA ) tablet 240 mg  240 mg Oral Daily Austria, Camellia PARAS, DO       azithromycin (ZITHROMAX) tablet 500 mg  500 mg Oral Daily Laurita Manor T, MD   500 mg at 04/16/24 1408   cefTRIAXone  (ROCEPHIN ) 1 g in sodium chloride  0.9 % 100 mL IVPB  1 g Intravenous Q24H Jonel Lonni SQUIBB, MD 200 mL/hr at 04/16/24 1421 1 g at 04/16/24 1421  Chlorhexidine  Gluconate Cloth 2 % PADS 6 each  6 each Topical Daily Austria, Eric J, DO   6 each at 04/17/24 1039   dexamethasone  (DECADRON ) tablet 4 mg  4 mg Oral Q12H Danford, Lonni SQUIBB, MD       enoxaparin  (LOVENOX ) injection 40 mg  40 mg Subcutaneous Q24H Laurita Manor T, MD   40 mg at 04/17/24 0841   folic acid  (FOLVITE ) tablet 1 mg  1 mg Oral Daily Laurita Manor T, MD   1 mg at 04/17/24 0841   furosemide  (LASIX ) tablet  20 mg  20 mg Oral Daily Laurita Manor T, MD   20 mg at 04/17/24 0841   haloperidol lactate (HALDOL) injection 5 mg  5 mg Intravenous Q6H PRN Austria, Eric J, DO   5 mg at 04/14/24 2314   ibuprofen  (ADVIL ) tablet 800 mg  800 mg Oral Q8H PRN Laurita Manor DASEN, MD       irbesartan  (AVAPRO ) tablet 75 mg  75 mg Oral Daily Laurita Manor T, MD   75 mg at 04/17/24 0841   methocarbamol (ROBAXIN) tablet 500 mg  500 mg Oral Q6H PRN Laurita Manor T, MD   500 mg at 04/17/24 1044   morphine  (MS CONTIN ) 12 hr tablet 60 mg  60 mg Oral Q8H Laurita Manor T, MD   60 mg at 04/17/24 0700   morphine  (MSIR) tablet 15 mg  15 mg Oral Q4H PRN Laurita Manor T, MD   15 mg at 04/17/24 1044   multivitamin with minerals tablet 1 tablet  1 tablet Oral Daily Laurita Manor T, MD   1 tablet at 04/17/24 9158   ondansetron  (ZOFRAN ) tablet 4 mg  4 mg Oral Q6H PRN Laurita Manor T, MD       Or   ondansetron  (ZOFRAN ) injection 4 mg  4 mg Intravenous Q6H PRN Laurita Manor T, MD       polyethylene glycol (MIRALAX / GLYCOLAX) packet 17 g  17 g Oral BID Laurita Manor T, MD   17 g at 04/17/24 9158   potassium chloride  SA (KLOR-CON  M) CR tablet 40 mEq  40 mEq Oral Daily Jonel Lonni SQUIBB, MD   40 mEq at 04/17/24 0841   senna-docusate (Senokot-S) tablet 2 tablet  2 tablet Oral BID Laurita Manor DASEN, MD   2 tablet at 04/17/24 9158   thiamine  (VITAMIN B1) tablet 100 mg  100 mg Oral Daily Laurita Manor T, MD   100 mg at 04/17/24 9158   Or   thiamine  (VITAMIN B1) injection 100 mg  100 mg Intravenous Daily Laurita Manor DASEN, MD         Discharge Medications: Please see discharge summary for a list of discharge medications.  Relevant Imaging Results:  Relevant Lab Results:   Additional Information SS# [id number]    Patient is on daily radiation. Patient is agreeable to daily transport.  Toy LITTIE Agar, RN

## 2024-04-17 NOTE — Assessment & Plan Note (Signed)
 Will continue to finish palliative radiation Recommend outpatient PSMA PET to assess eligibility for Pluvicto Follow-up after PET scan Continue palliative care follow-up as outpatient

## 2024-04-17 NOTE — Plan of Care (Signed)
  Problem: Clinical Measurements: Goal: Ability to maintain clinical measurements within normal limits will improve Outcome: Progressing Goal: Will remain free from infection Outcome: Progressing   Problem: Activity: Goal: Risk for activity intolerance will decrease Outcome: Progressing   Problem: Nutrition: Goal: Adequate nutrition will be maintained Outcome: Progressing   Problem: Elimination: Goal: Will not experience complications related to bowel motility Outcome: Progressing Goal: Will not experience complications related to urinary retention Outcome: Progressing

## 2024-04-18 ENCOUNTER — Inpatient Hospital Stay (HOSPITAL_COMMUNITY)

## 2024-04-18 DIAGNOSIS — R296 Repeated falls: Secondary | ICD-10-CM | POA: Diagnosis not present

## 2024-04-18 LAB — BASIC METABOLIC PANEL WITH GFR
Anion gap: 15 (ref 5–15)
BUN: 13 mg/dL (ref 8–23)
CO2: 24 mmol/L (ref 22–32)
Calcium: 10 mg/dL (ref 8.9–10.3)
Chloride: 99 mmol/L (ref 98–111)
Creatinine, Ser: 0.63 mg/dL (ref 0.61–1.24)
GFR, Estimated: >60 mL/min (ref >60)
Glucose, Bld: 107 mg/dL — ABNORMAL HIGH (ref 70–99)
Potassium: 4.2 mmol/L (ref 3.5–5.1)
Sodium: 137 mmol/L (ref 135–145)

## 2024-04-18 LAB — CULTURE, BLOOD (ROUTINE X 2)
Culture: NO GROWTH
Culture: NO GROWTH
Special Requests: ADEQUATE
Special Requests: ADEQUATE

## 2024-04-18 LAB — CBC
HCT: 31.8 % — ABNORMAL LOW (ref 39.0–52.0)
Hemoglobin: 10.9 g/dL — ABNORMAL LOW (ref 13.0–17.0)
MCH: 38.2 pg — ABNORMAL HIGH (ref 26.0–34.0)
MCHC: 34.3 g/dL (ref 30.0–36.0)
MCV: 111.6 fL — ABNORMAL HIGH (ref 80.0–100.0)
Platelets: 246 K/uL (ref 150–400)
RBC: 2.85 MIL/uL — ABNORMAL LOW (ref 4.22–5.81)
RDW: 13.2 % (ref 11.5–15.5)
WBC: 9.2 K/uL (ref 4.0–10.5)
nRBC: 0 % (ref 0.0–0.2)

## 2024-04-18 LAB — TESTOSTERONE, % FREE: Testosterone-% Free: 0.6 % (ref 0.2–0.7)

## 2024-04-18 MED ORDER — DIAZEPAM 5 MG PO TABS
5.0000 mg | ORAL_TABLET | Freq: Once | ORAL | Status: AC | PRN
  Administered 2024-04-19: 5 mg via ORAL
  Filled 2024-04-18: qty 1

## 2024-04-18 NOTE — Plan of Care (Signed)
  Problem: Clinical Measurements: Goal: Ability to maintain clinical measurements within normal limits will improve Outcome: Progressing Goal: Diagnostic test results will improve Outcome: Progressing   Problem: Nutrition: Goal: Adequate nutrition will be maintained Outcome: Progressing   Problem: Education: Goal: Knowledge of General Education information will improve Description: Including pain rating scale, medication(s)/side effects and non-pharmacologic comfort measures Outcome: Not Progressing   Problem: Health Behavior/Discharge Planning: Goal: Ability to manage health-related needs will improve Outcome: Not Progressing   Problem: Clinical Measurements: Goal: Will remain free from infection Outcome: Not Progressing Goal: Respiratory complications will improve Outcome: Not Progressing   Problem: Activity: Goal: Risk for activity intolerance will decrease Outcome: Not Progressing

## 2024-04-18 NOTE — Plan of Care (Signed)
  Problem: Coping: Goal: Level of anxiety will decrease Outcome: Progressing   Problem: Pain Managment: Goal: General experience of comfort will improve and/or be controlled Outcome: Progressing   Problem: Safety: Goal: Ability to remain free from injury will improve Outcome: Progressing   Problem: Skin Integrity: Goal: Risk for impaired skin integrity will decrease Outcome: Progressing

## 2024-04-18 NOTE — Plan of Care (Signed)

## 2024-04-18 NOTE — Progress Notes (Signed)
  Progress Note   Patient: David Townsend [name] DOB: Dec 31, 1960 DOA: 04/13/2024     5 DOS: the patient was seen and examined on 04/18/2024 at 10:49 AM      Brief hospital course: 63 y.o. M with castration resistant metastatic prostate cancer, HTN, and alcohol use who presented with falls.  Developed acute metabolic cephalopathy in the hospital       Assessment and Plan: Acute metabolic encephalopathy  Etiology of this is not entirely clear.  Timing of onset suggested is not from morphine , or Ativan , or other medications.  Possibly alcohol withdrawal.    Patient refusing MRI brain - Wean dexamethasone  - Standard delirium precautions - Alcohol cessation    Tractable pain malignancy Prostate cancer metastasis to bone, progressive Patient is also refusing radiation therapy due to pain with lying on the hard table - Continue apalutamide , Decadron  - Continue morphine  - Consult palliative care, medical oncology, radiation oncology, and urology - Plan for rehab, then follow-up with urology as an outpatient as well as palliative care to follow at SNF    Community-acquired versus atypical pneumonia Treated with 5 days Rocephin  and azithromycin, now resolved.     HTN Blood pressure normal - Continue furosemide , irbesartan , amlodipine   HLD -Hold atorvastatin  due to transaminitis      Transaminitis -Hold statin - Repeat LFTs tomorrow           Subjective: Delirium remains improved, we are pending SNF, he is refusing radiation therapy and MRI due to pain with laying on the table     Physical Exam: BP 118/79 (BP Location: Right Arm)   Pulse 95   Temp 98 F (36.7 C) (Oral)   Resp 18   Ht 5' 9 (1.753 m)   Wt 67.9 kg   SpO2 97%   BMI 22.09 kg/m   Thin adult male, lying in bed, interactive and appropriate RRR, no murmurs, no peripheral edema Respiratory normal, lungs clear without rales or wheezes Abdomen soft no tenderness palpation or guarding,  no ascites or distention He is attentive to questions, responsive, he seems to have some short-term memory loss, and processing difficulties, although psychomotor slowing is resolved, and he is oriented to person, place, and time, motor strength is symmetric, face symmetric, speech clear    Data Reviewed: Basic metabolic panel shows normal electrolytes and renal function CBC shows mild anemia, stable      Family Communication: Friend at the bedside    Disposition: Status is: Inpatient         Author: Lonni SHAUNNA Dalton, MD 04/18/2024 2:23 PM  For on call review [email or web address].

## 2024-04-19 ENCOUNTER — Inpatient Hospital Stay (HOSPITAL_COMMUNITY)

## 2024-04-19 DIAGNOSIS — R296 Repeated falls: Secondary | ICD-10-CM | POA: Diagnosis not present

## 2024-04-19 LAB — BASIC METABOLIC PANEL WITH GFR
Anion gap: 13 (ref 5–15)
BUN: 19 mg/dL (ref 8–23)
CO2: 27 mmol/L (ref 22–32)
Calcium: 10.1 mg/dL (ref 8.9–10.3)
Chloride: 98 mmol/L (ref 98–111)
Creatinine, Ser: 0.81 mg/dL (ref 0.61–1.24)
GFR, Estimated: >60 mL/min (ref >60)
Glucose, Bld: 111 mg/dL — ABNORMAL HIGH (ref 70–99)
Potassium: 4.3 mmol/L (ref 3.5–5.1)
Sodium: 137 mmol/L (ref 135–145)

## 2024-04-19 LAB — HEPATIC FUNCTION PANEL
ALT: 35 U/L (ref 0–44)
AST: 24 U/L (ref 15–41)
Albumin: 4 g/dL (ref 3.5–5.0)
Alkaline Phosphatase: 167 U/L — ABNORMAL HIGH (ref 38–126)
Bilirubin, Direct: 0.2 mg/dL (ref 0.0–0.2)
Indirect Bilirubin: 0.2 mg/dL — ABNORMAL LOW (ref 0.3–0.9)
Total Bilirubin: 0.4 mg/dL (ref 0.0–1.2)
Total Protein: 7.1 g/dL (ref 6.5–8.1)

## 2024-04-19 LAB — CBC
HCT: 33.7 % — ABNORMAL LOW (ref 39.0–52.0)
Hemoglobin: 11.4 g/dL — ABNORMAL LOW (ref 13.0–17.0)
MCH: 38.1 pg — ABNORMAL HIGH (ref 26.0–34.0)
MCHC: 33.8 g/dL (ref 30.0–36.0)
MCV: 112.7 fL — ABNORMAL HIGH (ref 80.0–100.0)
Platelets: 243 K/uL (ref 150–400)
RBC: 2.99 MIL/uL — ABNORMAL LOW (ref 4.22–5.81)
RDW: 13.3 % (ref 11.5–15.5)
WBC: 10.1 K/uL (ref 4.0–10.5)
nRBC: 0 % (ref 0.0–0.2)

## 2024-04-19 MED ORDER — NICOTINE 7 MG/24HR TD PT24
7.0000 mg | MEDICATED_PATCH | Freq: Every day | TRANSDERMAL | Status: AC
  Administered 2024-04-19: 7 mg via TRANSDERMAL
  Filled 2024-04-19: qty 1

## 2024-04-19 MED ORDER — HYDROMORPHONE HCL 1 MG/ML IJ SOLN
1.0000 mg | Freq: Once | INTRAMUSCULAR | Status: AC
  Administered 2024-04-19: 1 mg via INTRAVENOUS
  Filled 2024-04-19: qty 1

## 2024-04-19 MED ORDER — ACETAMINOPHEN 325 MG PO TABS
650.0000 mg | ORAL_TABLET | Freq: Four times a day (QID) | ORAL | Status: DC | PRN
  Administered 2024-04-19 – 2024-04-30 (×6): 650 mg via ORAL
  Filled 2024-04-19 (×6): qty 2

## 2024-04-19 MED ORDER — HYDROMORPHONE HCL 1 MG/ML IJ SOLN
1.0000 mg | INTRAMUSCULAR | Status: DC | PRN
  Administered 2024-04-19 – 2024-04-25 (×11): 1 mg via INTRAVENOUS
  Filled 2024-04-19 (×13): qty 1

## 2024-04-19 NOTE — Progress Notes (Signed)
 Daily Progress Note   Patient Name: David Townsend       Date: 04/19/2024 DOB: 07-04-1960  Age: 63 y.o. MRN#: [id number] Attending Physician: David Townsend, * Primary Care Physician: David Greig PARAS, NP Admit Date: 04/13/2024  Reason for Consultation/Follow-up: Establishing goals of care  Subjective: Resting in bed, awake alert, able to feed himself, lunch tray at bedside.  Discussed with patient about radiation, code status and overall goals of care, see below.   Patient states that his girlfriend HCPOA David Townsend is at work, she is an airline pilot as well as has her own customer service manager.   Length of Stay: 6  Current Medications: Scheduled Meds:   amLODipine   10 mg Oral Daily   apalutamide   240 mg Oral Daily   Chlorhexidine  Gluconate Cloth  6 each Topical Daily   dexamethasone   4 mg Oral Q12H   enoxaparin  (LOVENOX ) injection  40 mg Subcutaneous Q24H   folic acid   1 mg Oral Daily   furosemide   20 mg Oral Daily   irbesartan   75 mg Oral Daily   morphine   60 mg Oral Q8H   multivitamin with minerals  1 tablet Oral Daily   polyethylene glycol  17 g Oral BID   potassium chloride   40 mEq Oral Daily   senna-docusate  2 tablet Oral BID   thiamine   100 mg Oral Daily   Or   thiamine   100 mg Intravenous Daily    Continuous Infusions:    PRN Meds: haloperidol lactate, ibuprofen , methocarbamol, morphine , ondansetron  **OR** ondansetron  (ZOFRAN ) IV  Physical Exam         Resting in bed Awake alert No edema Regular work of breathing Appears with generalized weakness  Vital Signs: BP 129/71 (BP Location: Left Arm)   Pulse 75   Temp 97.6 F (36.4 C) (Oral)   Resp 18   Ht 5' 9 (1.753 m)   Wt 67.9 kg   SpO2 100%   BMI 22.09 kg/m  SpO2: SpO2: 100 % O2 Device: O2 Device: Room  Air O2 Flow Rate: O2 Flow Rate (L/min): 3 L/min  Intake/output summary:  Intake/Output Summary (Last 24 hours) at 04/19/2024 1158 Last data filed at 04/19/2024 0500 Gross per 24 hour  Intake 240 ml  Output 1450 ml  Net -1210 ml   LBM: Last BM Date :  04/18/24 Baseline Weight: Weight: 67.9 kg Most recent weight: Weight: 67.9 kg       Palliative Assessment/Data:      Patient Active Problem List   Diagnosis Date Noted   Frequent falls 04/13/2024   Palliative care encounter 04/09/2024   Goals of care, counseling/discussion 04/09/2024   Medication management 04/09/2024   Counseling and coordination of care 04/09/2024   Secondary malignant neoplasm of bone (HCC) 04/09/2024   Metastatic carcinoma to bone (HCC) 04/09/2024   Chronic low back pain without sciatica 04/08/2024   Weakness 04/08/2024   Cancer-related pain 04/07/2024   Lobar pneumonia, unspecified organism 09/28/2022   ARDS (adult respiratory distress syndrome) (HCC) 09/28/2022   Rhabdomyolysis 09/19/2022   Hyponatremia 09/19/2022   Sepsis (HCC) 09/19/2022   Prostate cancer (HCC) 04/23/2022   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   Tobacco abuse 04/23/2022   Essential hypertension 04/23/2022   Urinary retention 04/23/2022   Hyperlipidemia 03/27/2022   ETOH abuse 11/01/2015    Palliative Care Assessment & Plan   Patient Profile:    Assessment: David Townsend is a 63 y.o. male with past medical history significant for prostate cancer with metastasis to bone, anemia of chronic medical disease, HTN, HLD, GERD, pain of malignancy with chronic opioid dependence,, tobacco/alcohol use disorder who presented to [facility] ED on 04/10/2024 with multiple complaints including generalized weakness, frequent falls at home.   Recently hospitalized and discharged on 04/10/2024 for uncontrolled cancer pain, pain management/palliative care was consulted, his pain regimen was adjusted on discharge.  He started to take morphine  IR 30 mg every 3-4 hours for severe pain and morphine  ER 60 mg twice daily. He was discharged home on Friday. Since Saturday, patient started develop generalized weakness, especially bilateral lower extremities, and sustained several falls at home. Gradually getting worse and re admitted to the hospital.   Recommendations/Plan: 11-11: Call placed and goals of care discussions held with HCPOA David Townsend, patient's advance care planning documents are now scanned onto the chart and have been reviewed with HCPOA friend David Townsend. Patient has elected for full code status at this time, as per ACP documents that have been completed recently. Discussed with David Townsend about patient's MRI and current condition in detail, to the best of my ability.  Full code full scope care for now Recommend SNF rehab with palliative, patient lives alone and HCPOA friend David Townsend is available but doesn't live with the patient, David Townsend is worried about the patient's frequent falls and worries that he is not taking his medications as prescribed. Will recommend TOC consult.   11-12: Radiation oncology also following.  Recommend SNF rehab with palliative.  Continue to monitor symptoms.  Wean down decadron  IV 11-13: Call placed and discussed with HCPOA David Townsend after obtaining patient's permission. We discussed about patient's ongoing functional decline, again re visited code status discussions, we talked about elevated liver function tests, ultrasound from 2024 alluding to possibility of cirrhosis, his episodic confusion, recent generalized weakness and falls. Talked about appropriateness of DNR DNI at this point, she will reflect on this and discuss with patient. Full code for now. I told her about MRI brain which is ordered and pending. Updated David Townsend to the best of my ability.  11-14: Full code SNF rehab with palliative on discharge Recommend outpatient palliative appointment as well.  MRI brain pending.    11-16: Full code full scope care Patient complains of severe pain while undergoing radiation, discussed with patient about IV PRN opioids prior to  radiation. Patient asks to be numbed out before radiation, that's the only way I could get through.  SNF rehab with palliative - TOC working on this.  Agree with decadron  wean  Goals of Care and Additional Recommendations: Limitations on Scope of Treatment: Full Scope Treatment  Code Status:    Code Status Orders  (From admission, onward)           Start     Ordered   04/13/24 1418  Full code  Continuous       Question:  By:  Answer:  Consent: discussion documented in EHR   04/13/24 1418           Code Status History     Date Active Date Inactive Code Status Order ID Comments User Context   04/07/2024 2244 04/10/2024 2246 Full Code [id number]  Lou Claretta HERO, MD ED   09/19/2022 2249 10/04/2022 2135 Full Code [id number]  Ricky Alfrieda DASEN, DO Inpatient       Prognosis:  Unable to determine  Discharge Planning: SNF rehab with palliative.   Care plan was discussed with patient.    Thank you for allowing the Palliative Medicine Team to assist in the care of this patient. mod MDM.      Greater than 50%  of this time was spent counseling and coordinating care related to the above assessment and plan.  Lonia Serve, MD  Please contact Palliative Medicine Team phone at [phone] for questions and concerns.

## 2024-04-19 NOTE — Plan of Care (Signed)
  Problem: Activity: Goal: Risk for activity intolerance will decrease Outcome: Progressing   Problem: Education: Goal: Knowledge of General Education information will improve Description: Including pain rating scale, medication(s)/side effects and non-pharmacologic comfort measures Outcome: Not Progressing   Problem: Health Behavior/Discharge Planning: Goal: Ability to manage health-related needs will improve Outcome: Not Progressing   Problem: Clinical Measurements: Goal: Ability to maintain clinical measurements within normal limits will improve Outcome: Not Progressing Goal: Will remain free from infection Outcome: Not Progressing Goal: Diagnostic test results will improve Outcome: Not Progressing Goal: Respiratory complications will improve Outcome: Not Progressing Goal: Cardiovascular complication will be avoided Outcome: Not Progressing

## 2024-04-19 NOTE — Progress Notes (Signed)
  Progress Note   Patient: David Townsend [name] DOB: [birth year] DOA: 04/13/2024     6 DOS: the patient was seen and examined on 04/19/2024 at 8:05 AM      Brief hospital course: 63 y.o. M with castration resistant metastatic prostate cancer, HTN, and alcohol use who presented with falls.        Assessment and Plan: Acute metabolic encephalopathy  See note from yesterday, etiology not clear, possibly alcohol withdrawal, possibly tumor related delirium.  Refusing MRI brain. 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   HTN Blood pressure normal - Continue furosemide , irbesartan , amlodipine    HLD -Hold atorvastatin  due to transaminitis    Transaminitis  Resolved. - Hold statin              Subjective: Patient is having a lot of back pain today.  He has no fever, change in mental status, remains somewhat confused, no seizures, no loss of consciousness.     Physical Exam: BP 129/71 (BP Location: Left Arm)   Pulse 75   Temp 97.6 F (36.4 C) (Oral)   Resp 18   Ht 5' 9 (1.753 m)   Wt 67.9 kg   SpO2 100%   BMI 22.09 kg/m   Thin adult male, interactive and appropriate, but lying in bed, appears uncomfortable RRR, no murmurs, no peripheral edema Respiratory rate normal, lungs clear without rales or wheezing Abdomen soft, no tenderness palpation, no guarding, no ascites Attentive to questions and  responsive, but persistent short-term memory loss, processing difficulties.  Strength symmetric in upper and lower extremities bilaterally  Data Reviewed: Basic metabolic panel shows normal electrolytes and renal function CBC shows mild anemia only, no leukocytosis, no thrombocytopenia    Disposition: Status is: Inpatient         Author: Lonni SHAUNNA Dalton, MD 04/19/2024 10:59 AM  For on call review [email or web address].

## 2024-04-20 ENCOUNTER — Ambulatory Visit

## 2024-04-20 ENCOUNTER — Ambulatory Visit: Payer: Self-pay | Admitting: Family

## 2024-04-20 ENCOUNTER — Other Ambulatory Visit: Payer: Self-pay

## 2024-04-20 ENCOUNTER — Encounter: Payer: Self-pay | Admitting: Radiation Oncology

## 2024-04-20 DIAGNOSIS — R296 Repeated falls: Secondary | ICD-10-CM | POA: Diagnosis not present

## 2024-04-20 DIAGNOSIS — R531 Weakness: Secondary | ICD-10-CM | POA: Diagnosis not present

## 2024-04-20 DIAGNOSIS — C7951 Secondary malignant neoplasm of bone: Secondary | ICD-10-CM | POA: Diagnosis not present

## 2024-04-20 DIAGNOSIS — Z7189 Other specified counseling: Secondary | ICD-10-CM | POA: Diagnosis not present

## 2024-04-20 LAB — RAD ONC ARIA SESSION SUMMARY
Course Elapsed Days: 10
Plan Fractions Treated to Date: 1
Plan Prescribed Dose Per Fraction: 3.5 Gy
Plan Total Fractions Prescribed: 4
Plan Total Prescribed Dose: 14 Gy
Reference Point Dosage Given to Date: 3.5 Gy
Reference Point Session Dosage Given: 3.5 Gy
Session Number: 5

## 2024-04-20 NOTE — Progress Notes (Signed)
  Radiation Oncology         [phone]) [phone] ________________________________  Name: David Townsend  [name]  Date of Service: 04/20/24  DOB: 1960-12-28   Steroid Taper Instructions   You currently have a prescription for Dexamethasone  4 mg Tablets.    Beginning 04/27/24: Take 1/2 of a tablet (which is 2 mg) twice a day  Beginning 05/04/24: Take 1/2 of a tablet (which is 2 mg) once a day  Beginning 05/11/24: Take 1/2 of a tablet (which is 2 mg) every other day and stop on 12/14.   Please call our office if you have any uncontrolled movements, loss of bodily sensation, or extremity weakness

## 2024-04-20 NOTE — TOC Progression Note (Signed)
 Transition of Care [facility]) - Progression Note    Patient Details  Name: David Townsend MRN: [id number] Date of Birth: 18-Apr-1961  Transition of Care [facility]) CM/SW Contact  Jon ONEIDA Anon, RN Phone Number: 04/20/2024, 2:25 PM  Clinical Narrative:    Pt has no bed offers at this time. RNCM spoke with Erie, Admissions Coordinator at [facility] and she states she can not offer a bed due to insurance not being in network.   RNCM spoke with Kia at [facility], and she states they are considering depending on bed availability. States they will not not have a bed available until Thursday 11/20.   IP CM will continue to follow and work on finding SNF placement.     Expected Discharge Plan: Skilled Nursing Facility Barriers to Discharge: Continued Medical Work up               Expected Discharge Plan and Services In-house Referral: NA Discharge Planning Services: CM Consult Post Acute Care Choice: NA Living arrangements for the past 2 months: Single Family Home                 DME Arranged: N/A DME Agency: NA       HH Arranged: NA HH Agency: NA         Social Drivers of Health (SDOH) Interventions SDOH Screenings   Food Insecurity: No Food Insecurity (04/13/2024)  Housing: Low Risk  (04/13/2024)  Transportation Needs: No Transportation Needs (04/13/2024)  Utilities: Not At Risk (04/13/2024)  Alcohol Screen: Medium Risk (12/04/2023)  Depression (PHQ2-9): Low Risk  (12/04/2023)  Tobacco Use: High Risk (04/13/2024)    Readmission Risk Interventions    04/16/2024    1:08 PM  Readmission Risk Prevention Plan  Transportation Screening Complete  Medication Review (RN Care Manager) Referral to Pharmacy  PCP or Specialist appointment within 3-5 days of discharge Complete  HRI or Home Care Consult Complete  SW Recovery Care/Counseling Consult Complete  Palliative Care Screening Not Applicable  Skilled Nursing Facility Complete

## 2024-04-20 NOTE — Progress Notes (Signed)
 Daily Progress Note   Patient Name: David Townsend       Date: 04/20/2024 DOB: 31-Dec-1960  Age: 63 y.o. MRN#: [id number] Attending Physician: Jonel Lonni SQUIBB, * Primary Care Physician: Jaycee Greig PARAS, NP Admit Date: 04/13/2024  Reason for Consultation/Follow-up: Establishing goals of care  Subjective: Resting in bed, awake alert, HCPOA GF [name] on the phone - awaiting today's radiation appointment, patient wishes to continue with radiation as long as his PRN breakthrough PO or IV opioids can be given prior to his radiation appointment, medication history noted and also discusse with patient and HCPOA.    Length of Stay: 7  Current Medications: Scheduled Meds:   amLODipine   10 mg Oral Daily   apalutamide   240 mg Oral Daily   Chlorhexidine  Gluconate Cloth  6 each Topical Daily   dexamethasone   4 mg Oral Q12H   enoxaparin  (LOVENOX ) injection  40 mg Subcutaneous Q24H   folic acid   1 mg Oral Daily   furosemide   20 mg Oral Daily   irbesartan   75 mg Oral Daily   morphine   60 mg Oral Q8H   multivitamin with minerals  1 tablet Oral Daily   nicotine   7 mg Transdermal Daily   polyethylene glycol  17 g Oral BID   potassium chloride   40 mEq Oral Daily   senna-docusate  2 tablet Oral BID   thiamine   100 mg Oral Daily   Or   thiamine   100 mg Intravenous Daily    Continuous Infusions:    PRN Meds: acetaminophen , haloperidol lactate, HYDROmorphone  (DILAUDID ) injection, ibuprofen , methocarbamol, morphine , ondansetron  **OR** ondansetron  (ZOFRAN ) IV  Physical Exam         Resting in bed Awake alert No edema Regular work of breathing Appears with generalized weakness  Vital Signs: BP 126/85 (BP Location: Left Arm)   Pulse 87   Temp 98.6 F (37 C) (Oral)   Resp 18   Ht 5' 9  (1.753 m)   Wt 67.9 kg   SpO2 98%   BMI 22.09 kg/m  SpO2: SpO2: 98 % O2 Device: O2 Device: Room Air O2 Flow Rate: O2 Flow Rate (L/min): 3 L/min  Intake/output summary:  Intake/Output Summary (Last 24 hours) at 04/20/2024 1034 Last data filed at 04/20/2024 0400 Gross per 24 hour  Intake 240 ml  Output 900  ml  Net -660 ml   LBM: Last BM Date : 04/18/24 Baseline Weight: Weight: 67.9 kg Most recent weight: Weight: 67.9 kg       Palliative Assessment/Data:      Patient Active Problem List   Diagnosis Date Noted   Frequent falls 04/13/2024   Palliative care encounter 04/09/2024   Goals of care, counseling/discussion 04/09/2024   Medication management 04/09/2024   Counseling and coordination of care 04/09/2024   Secondary malignant neoplasm of bone (HCC) 04/09/2024   Metastatic carcinoma to bone (HCC) 04/09/2024   Chronic low back pain without sciatica 04/08/2024   Weakness 04/08/2024   Cancer-related pain 04/07/2024   Lobar pneumonia, unspecified organism 09/28/2022   ARDS (adult respiratory distress syndrome) (HCC) 09/28/2022   Rhabdomyolysis 09/19/2022   Hyponatremia 09/19/2022   Sepsis (HCC) 09/19/2022   Prostate cancer (HCC) 04/23/2022   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   Tobacco abuse 04/23/2022   Essential hypertension 04/23/2022   Urinary retention 04/23/2022   Hyperlipidemia 03/27/2022   ETOH abuse 11/01/2015    Palliative Care Assessment & Plan   Patient Profile:    Assessment: David Townsend is a 63 y.o. male with past medical history significant for prostate cancer with metastasis to bone, anemia of chronic medical disease, HTN, HLD, GERD, pain of malignancy with chronic opioid dependence,, tobacco/alcohol use disorder who presented to [facility] ED on 04/10/2024 with multiple complaints including generalized weakness, frequent falls at home.   Recently hospitalized and discharged on 04/10/2024 for uncontrolled  cancer pain, pain management/palliative care was consulted, his pain regimen was adjusted on discharge. He started to take morphine  IR 30 mg every 3-4 hours for severe pain and morphine  ER 60 mg twice daily. He was discharged home on Friday. Since Saturday, patient started develop generalized weakness, especially bilateral lower extremities, and sustained several falls at home. Gradually getting worse and re admitted to the hospital.   Recommendations/Plan: 11-11: Call placed and goals of care discussions held with HCPOA [name], patient's advance care planning documents are now scanned onto the chart and have been reviewed with HCPOA friend [name]. Patient has elected for full code status at this time, as per ACP documents that have been completed recently. Discussed with [name] about patient's MRI and current condition in detail, to the best of my ability.  Full code full scope care for now Recommend SNF rehab with palliative, patient lives alone and HCPOA friend [name] is available but doesn't live with the patient, [name] is worried about the patient's frequent falls and worries that he is not taking his medications as prescribed. Will recommend TOC consult.   11-12: Radiation oncology also following.  Recommend SNF rehab with palliative.  Continue to monitor symptoms.  Wean down decadron  IV 11-13: Call placed and discussed with HCPOA [name] after obtaining patient's permission. We discussed about patient's ongoing functional decline, again re visited code status discussions, we talked about elevated liver function tests, ultrasound from 2024 alluding to possibility of cirrhosis, his episodic confusion, recent generalized weakness and falls. Talked about appropriateness of DNR DNI at this point, she will reflect on this and discuss with patient. Full code for now. I told her about MRI brain which is ordered and pending. Updated [name] to the best of my ability.  11-14: Full code SNF rehab with  palliative on discharge Recommend outpatient palliative appointment as well.  MRI brain pending.   11-16: Full code full scope care Patient complains of severe pain  while undergoing radiation, discussed with patient about IV PRN opioids prior to radiation. Patient asks to be numbed out before radiation, that's the only way I could get through.  SNF rehab with palliative - TOC working on this.  Agree with decadron  wean  11-17: PMT follows to monitor opioid needs, agree with SNF rehab with palliative.   Goals of Care and Additional Recommendations: Limitations on Scope of Treatment: Full Scope Treatment  Code Status:    Code Status Orders  (From admission, onward)           Start     Ordered   04/13/24 1418  Full code  Continuous       Question:  By:  Answer:  Consent: discussion documented in EHR   04/13/24 1418           Code Status History     Date Active Date Inactive Code Status Order ID Comments User Context   04/07/2024 2244 04/10/2024 2246 Full Code [id number]  Lou Claretta HERO, MD ED   09/19/2022 2249 10/04/2022 2135 Full Code [id number]  Ricky Alfrieda DASEN, DO Inpatient       Prognosis:  Unable to determine  Discharge Planning: SNF rehab with palliative.   Care plan was discussed with patient and HCPOA [name].    Thank you for allowing the Palliative Medicine Team to assist in the care of this patient. high MDM.      Greater than 50%  of this time was spent counseling and coordinating care related to the above assessment and plan.  Lonia Serve, MD  Please contact Palliative Medicine Team phone at [phone] for questions and concerns.

## 2024-04-20 NOTE — Plan of Care (Signed)
  Problem: Health Behavior/Discharge Planning: Goal: Ability to manage health-related needs will improve Outcome: Progressing   Problem: Clinical Measurements: Goal: Respiratory complications will improve Outcome: Progressing Goal: Cardiovascular complication will be avoided Outcome: Progressing   Problem: Activity: Goal: Risk for activity intolerance will decrease Outcome: Progressing   Problem: Education: Goal: Knowledge of General Education information will improve Description: Including pain rating scale, medication(s)/side effects and non-pharmacologic comfort measures Outcome: Not Progressing   Problem: Clinical Measurements: Goal: Ability to maintain clinical measurements within normal limits will improve Outcome: Not Progressing Goal: Will remain free from infection Outcome: Not Progressing Goal: Diagnostic test results will improve Outcome: Not Progressing

## 2024-04-20 NOTE — Progress Notes (Signed)
  Progress Note   Patient: David Townsend [name] DOB: 10-31-1960 DOA: 04/13/2024     7 DOS: the patient was seen and examined on 04/20/2024  at 9:50AM      Brief hospital course: 63 y.o. M with castration resistant metastatic prostate cancer, HTN, and alcohol use who presented with falls.  Recently hospitalized and discharged on 04/10/2024 for uncontrolled cancer pain, pain management/palliative care was consulted, his pain regimen was adjusted on discharge. He started to take morphine  IR 30 mg every 3-4 hours for severe pain and morphine  ER 60 mg twice daily. He was discharged home on Friday. Since Saturday, patient started develop generalized weakness, especially bilateral lower extremities, and sustained several falls at home. Gradually getting worse and today he could not stand on his feet due to severe weakness.         Assessment and Plan: Acute metabolic encephalopathy  See note from 11/15, etiology not clear, given its persistence, I suspect this is some cancer related delirium.    Refusing MRI brain several times.  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   HTN Blood pressure normal - Continue Lasix , irbesartan , Norvasc    HLD -  Lipitor held due to transaminitis    Transaminitis  Resolved           Subjective: Patient remains somewhat confused.  He is to get radiation again today.  He refused MRI brain again yesterday.  He is still waiting on SNF.     Physical Exam: BP 126/85 (BP Location: Left Arm)   Pulse 87   Temp 98.6 F (37 C) (Oral)   Resp 18   Ht 5' 9 (1.753 m)   Wt 67.9 kg   SpO2 98%   BMI 22.09 kg/m   Adult male, sitting up in bed, interactive and conversational, but tangential, and appears to have short-term memory and processing difficulties RRR, no murmurs, peripheral edema Respiratory rate normal, lungs diminished but no rales or wheezes appreciated Abdomen soft, no tenderness palpation, no guarding, no ascites Short-term memory and processing difficulties remain, strength symmetric in upper and lower extremities bilaterally    Data Reviewed: Discussed with radiation oncology and palliative care      Disposition: Status is: Inpatient         Author: Lonni SHAUNNA Dalton, MD 04/20/2024 10:53 AM  For on call review [email or web address]

## 2024-04-20 NOTE — Plan of Care (Signed)

## 2024-04-20 NOTE — Progress Notes (Signed)
 David Townsend   DOB:10/09/1960   FM#:[id number]      ASSESSMENT & PLAN:  David Townsend is a 63 year old male patient with oncologic history significant for prostate cancer with bone mets.  He was admitted on 04/13/2024 with complaints of feeling weak, frequent falls, and feeling woozy.  Medical Oncology following.   Prostate cancer with mets to bones - Initially diagnosed 2023. - Imaging done 11/27/2023 showed multifocal widespread bone mets.  Repeat imaging 04/14/2024 showed unchanged appearance of metastases. - On radiation therapy, continue as appropriate. - Urology following - Palliative following.  Agree with recommendations for SNF upon discharge. - Medical oncology/Dr. Tina following.     Anemia - Improving -- Likely secondary to malignancy and bone marrow suppression from alcoholism. -- Hemoglobin 11.4 on 11/16, baseline appears to be in the 11-12 range.  -Patient seen in 2023 at [facility] for anemia.  At that time he stated he was not interested in quitting alcohol, he declined colonoscopy. - No intervention required at this time - Continue to monitor CBC with differential   Generalized weakness Frequent falls Altered mentation - Admitted with increasing weakness and frequent falls at home. -- patient has been refusing MRI brain - Status post one-to-one observation for safety - Continue supportive care   History of B12 deficiency anemia - Diagnosed November 2023.  Patient declined to get B12 monthly injections and preferred to follow-up with PCP.  It is unclear whether he continued treatment.    Code Status Full   Subjective:  Patient seen awake and alert laying in bed, just returned from radiation therapy.  States he has ongoing back pain.  Also said radiation therapy is tough.  Appears to be more alert and conversant compared to last week although still noted with confusion.  No acute distress is noted.  Objective:   Intake/Output Summary (Last 24 hours) at  04/20/2024 1240 Last data filed at 04/20/2024 1046 Gross per 24 hour  Intake 240 ml  Output 1100 ml  Net -860 ml     PHYSICAL EXAMINATION: ECOG PERFORMANCE STATUS: 3 - Symptomatic, >50% confined to bed  Vitals:   04/19/24 2032 04/20/24 0444  BP: 121/71 126/85  Pulse: 84 87  Resp: 18 18  Temp: 98 F (36.7 C) 98.6 F (37 C)  SpO2: 97% 98%   Filed Weights   04/15/24 1739  Weight: 149 lb 9.6 oz (67.9 kg)    GENERAL: alert, no distress and comfortable +chronically ill-appearing SKIN: +pale skin color, texture, turgor are normal, no rashes or significant lesions EYES: normal, conjunctiva are pink and non-injected, sclera clear OROPHARYNX: no exudate, no erythema and lips, buccal mucosa, and tongue normal  NECK: supple, thyroid  normal size, non-tender, without nodularity LYMPH: no palpable lymphadenopathy in the cervical, axillary or inguinal LUNGS: clear to auscultation and percussion with normal breathing effort HEART: regular rate & rhythm and no murmurs and no lower extremity edema ABDOMEN: abdomen soft, non-tender and normal bowel sounds MUSCULOSKELETAL: no cyanosis of digits and no clubbing  PSYCH: alert +altered mentation NEURO: no focal motor/sensory deficits   All questions were answered. The patient knows to call the clinic with any problems, questions or concerns.   The total time spent in the appointment was 40 minutes encounter with patient including review of chart and various tests results, discussions about plan of care and coordination of care plan  Olam JINNY Brunner, NP 04/20/2024 12:40 PM    Labs Reviewed:  Lab Results  Component Value Date  WBC 10.1 04/19/2024   HGB 11.4 (L) 04/19/2024   HCT 33.7 (L) 04/19/2024   MCV 112.7 (H) 04/19/2024   PLT 243 04/19/2024   Recent Labs    11/28/23 1957 03/27/24 1438 04/15/24 0626 04/16/24 0700 04/17/24 0709 04/18/24 0604 04/19/24 0632  NA  --    < > 138 139 136 137 137  K  --    < > 3.7 3.2* 3.7 4.2 4.3   CL  --    < > 103 101 97* 99 98  CO2  --    < > 21* 27 26 24 27   GLUCOSE  --    < > 118* 108* 87 107* 111*  BUN  --    < > 18 13 11 13 19   CREATININE  --    < > 0.65 0.55* 0.61 0.63 0.81  CALCIUM   --    < > 9.6 9.9 9.8 10.0 10.1  GFRNONAA  --    < > >60 >60 >60 >60 >60  PROT 8.6*   < > 6.5 6.4*  --   --  7.1  ALBUMIN 5.2*   < > 3.5 3.5  --   --  4.0  AST 29   < > 93* 46*  --   --  24  ALT 27   < > 64* 58*  --   --  35  ALKPHOS 147*   < > 174* 165*  --   --  167*  BILITOT 1.2   < > 0.3 0.3  --   --  0.4  BILIDIR 0.1  --   --   --   --   --  0.2  IBILI 1.1*  --   --   --   --   --  0.2*   < > = values in this interval not displayed.    Studies Reviewed:  MR CERVICAL SPINE W WO CONTRAST Result Date: 04/14/2024 EXAM: MRI CERVICAL SPINE WITH AND WITHOUT CONTRAST 04/14/2024 02:57:42 PM TECHNIQUE: Multiplanar multisequence MRI of the cervical spine was performed without and with the administration of intravenous contrast. 8 mL (gadobutrol (GADAVIST) 1 MMOL/ML injection 8 mL GADOBUTROL 1 MMOL/ML IV SOLN). COMPARISON: MRI thoracic spine 04/14/2024 and 04/08/2024. No prior cervical spine imaging. CLINICAL HISTORY: Metastatic disease evaluation. FINDINGS: BONES AND ALIGNMENT: Mild reversal of the normal cervical lordosis. No significant listhesis. Preserved cervical vertebral body heights without a definite fracture identified. Widespread abnormal bone marrow signal consistent with known metastatic disease, most notable throughout the mid and lower cervical and included upper thoracic spine with skull base involvement noted as well (left occipital condyle). Prominent involvement of the right sided posterior elements at C5. SPINAL CORD: Normal spinal cord size. Normal spinal cord signal. SOFT TISSUES: No paraspinal mass. Mildly prominent smooth dorsal epidural enhancement in a bilateral and symmetric fashion throughout the cervical spine as well as slightly more prominent dorsal and ventral epidural  enhancement in the included upper thoracic spine. No masslike epidural enhancement. DISC LEVELS: C2-C3: Mild facet arthrosis without disc herniation or stenosis. C3-C4: Severe disc space narrowing. A broad based posterior disc osteophyte complex and mild facet arthrosis result in mild left sided spinal stenosis and mild to moderate right and severe left neural foraminal stenosis. C4-C5: Severe disc space narrowing. A broad based posterior disc osteophyte complex and mild facet arthrosis result in mild spinal stenosis and severe right and moderate left neural foraminal stenosis. C5-C6: Moderate disc space narrowing. Disc bulging, uncovertebral spurring, and mild  facet arthrosis result in mild spinal stenosis and severe bilateral neural foraminal stenosis. C6-C7: Severe disc space narrowing. A broad based posterior disc osteophyte complex and mild facet arthrosis result in mild spinal stenosis and severe bilateral neural foraminal stenosis. C7-T1: Severe facet arthrosis results in borderline to mild right and mild to moderate left neural foraminal stenosis without spinal stenosis. IMPRESSION: 1. Osseous metastatic disease throughout the cervical and including upper thoracic spine as well as involving the skull base. 2. Mildly prominent dorsal epidural enhancement throughout the cervical spine as well as ventral and dorsal epidural enhancement in the included upper thoracic spine. This is favored to reflect prominent epidural venous plexus, although small volume epidural tumor is not excluded. 3. Widespread disc degeneration with mild multilevel spinal stenosis and severe neural foraminal stenosis as above. Electronically signed by: Dasie Hamburg MD 04/14/2024 04:56 PM EST RP Workstation: [id number]   MR THORACIC SPINE W WO CONTRAST Result Date: 04/14/2024 EXAM: MRI THORACIC SPINE WITHOUT AND WITH INTRAVENOUS CONTRAST 04/14/2024 02:57:42 PM TECHNIQUE: Multiplanar multisequence MRI of the thoracic spine was performed  without and with the administration of 8 mL of gadobutrol (GADAVIST) 1 MMOL/ML intravenous contrast. COMPARISON: MRI thoracic spine 04/08/2024. CLINICAL HISTORY: Myelopathy, acute, thoracic spine. FINDINGS: LIMITATIONS: The examination is motion degraded, including moderate to severe motion on the axial T2 gradient echo sequence and mild to moderate motion on other sequences. BONES AND ALIGNMENT: Normal alignment. Extensive, widespread metastatic disease throughout the vertebral bodies and posterior elements of the thoracic spine has not significantly changed from the recent prior MRI. Multiple compression fractures are again seen as detailed on the prior MRI without evidence of interval progression, and this includes a T8 fracture with 30% height loss and mild retropulsion. There is also mild retropulsion at T4 and T6. Assessment for marrow edema is again limited due to widespread marrow signal abnormality. Mildly prominent ventral epidural enhancement throughout the upper and mid thoracic spine is similar to the prior MRI and again favored to predominantly reflect prominent epidural venous plexus, although small volume epidural tumor is difficult to exclude. No masslike epidural enhancement is present. SPINAL CORD: Questionable mild hazy T2/STIR hyperintensity throughout the mid thoracic spinal cord, although this is most conspicuous on the sagittal STIR sequence and is not clearly confirmed on the standard sagittal and axial T2 sequences and may be artifactual. SOFT TISSUES: Dorsal epidural lipomatosis is again noted and results in spinal stenosis with essentially complete CSF effacement from T3-T8 with mild mass effect on the spinal cord as before. IMPRESSION: 1. Unchanged appearance of widespread metastatic disease in the thoracic spine with multiple nonprogressive pathologic compression fractures. 2. Unchanged mildly prominent epidural enhancement in the upper and mid thoracic spine, again favored to  predominantly reflect prominent epidural venous plexus with small volume epidural tumor not excluded. No mass-like epidural enhancement. 3. Unchanged severe spinal stenosis with CSF effacement from T3-T8 predominantly due to epidural lipomatosis. Low level spinal cord edema is not excluded. Electronically signed by: Dasie Hamburg MD 04/14/2024 04:37 PM EST RP Workstation: [id number]   DG CHEST PORT 1 VIEW Result Date: 04/14/2024 CLINICAL DATA:  Pneumonia . EXAM: PORTABLE CHEST 1 VIEW COMPARISON:  04/13/2024 FINDINGS: The cardio pericardial silhouette is enlarged. Diffuse interstitial and patchy airspace disease again noted with progression of airspace disease in the left apex and right mid and lower lung. No substantial pleural effusion. The cardio pericardial silhouette is enlarged. Telemetry leads overlie the chest. Gaseous distention of colon noted in the visualized upper abdomen. IMPRESSION: Diffuse  interstitial and patchy airspace disease with progression of airspace disease in the left apex and right mid and lower lung. Electronically Signed   By: Camellia Candle M.D.   On: 04/14/2024 07:45   DG Chest Port 1 View Result Date: 04/13/2024 CLINICAL DATA:  Shortness of breath, chest pain and back pain. Fall. EXAM: PORTABLE CHEST 1 VIEW COMPARISON:  04/10/2024 and CT chest 11/27/2023. FINDINGS: Trachea is midline. Heart size within normal limits. Diffuse hazy airspace opacification, somewhat upper and midlung zone predominant and progressive from 04/10/2024. No pleural effusions. IMPRESSION: Increasing hazy upper and midlung zone predominant airspace opacification may be due to a viral or atypical pneumonia. Electronically Signed   By: Newell Eke M.D.   On: 04/13/2024 12:53   DG CHEST PORT 1 VIEW Result Date: 04/10/2024 EXAM: 1 VIEW(S) XRAY OF THE CHEST 04/10/2024 07:07:00 AM COMPARISON: 11/26/2023 CLINICAL HISTORY: Dyspnea FINDINGS: LUNGS AND PLEURA: Low lung volumes. Bilateral ill defined peripheral  and basilar airspace opacities. Mild predominantly perihilar interstitial prominence. Possible small left pleural effusion. No pulmonary edema. No pneumothorax. HEART AND MEDIASTINUM: No acute abnormality of the cardiac and mediastinal silhouettes. BONES AND SOFT TISSUES: No acute osseous abnormality. IMPRESSION: 1. Peripheral and basilar airspace opacities with mild predominantly perihilar interstitial prominence, compatible with multifocal infection or inflammation. 2. Low lung volumes. 3. Possible small left pleural effusion. Electronically signed by: Katheleen Faes MD 04/10/2024 11:13 AM EST RP Workstation: [id number]   MR THORACIC SPINE W WO CONTRAST Result Date: 04/08/2024 EXAM: MRI THORACIC SPINE WITH AND WITHOUT INTRAVENOUS CONTRAST 04/08/2024 03:43:35 PM TECHNIQUE: Multiplanar multisequence MRI of the thoracic spine was performed with and without the administration of intravenous contrast. COMPARISON: MRI thoracic spine 09/19/2022. MRI lumbar spine 03/27/2024. CTA chest, abdomen, and pelvis 11/27/2023. CLINICAL HISTORY: Metastatic disease evaluation. Severe back pain. History of metastatic prostate cancer. FINDINGS: BONES AND ALIGNMENT: There is widespread metastatic disease involving every level in the thoracic spine which has greatly progressed from the prior thoracic spine MRI. There is complete or near complete marrow replacement in the T2 through T8 vertebral bodies with large lesions noted at T11 and T12 as well. Posterior element involvement is particularly extensive from T4 to T6. Multiple compression fractures are present. A T4 compression fracture is new from the prior thoracic MRI but was present on the interval chest CT with mildly progressive vertebral body height loss, now measuring 25%. A mild T3 compression fracture is new or progressive, with 10% vertebral body height loss. There are new T5 and T6 compression fractures with 20% height loss each. A T8 compression fracture is new or  progressive with 30% height loss. Assessment for marrow edema associated with these fractures is limited due to the underlying widespread marrow signal abnormality; however, there is a visible unhealed fracture line within the T8 vertebral body extending to the posterior cortex. T8 retropulsion measures 2 to 3 mm. Mildly prominent epidural enhancement, most prominent ventrally from T5 to T8, is favored to reflect prominent epidural venous plexus without masslike enhancement seen to clearly indicate epidural tumor, although minimal epidural tumor in this region is not excluded. SPINAL CORD: There is apparent mild hyperintensity throughout the majority of the thoracic spinal cord on the sagittal STIR sequence which is not clearly confirmed on the standard sagittal T2 sequence. Assessment of the spinal cord on axial images is limited due to severe motion artifact. SOFT TISSUES: Dorsal epidural lipomatosis has increased from the prior thoracic spine MRI and results in spinal stenosis with essentially complete CSF effacement  from T3 to T8. There is mild mass effect on the spinal cord in the region from T3 to T8. IMPRESSION: 1. Progressive, widespread metastatic disease throughout the thoracic spine. 2. Multiple new or progressive pathologic compression fractures, most notably at T8 where there is an unhealed fracture with 30% height loss and mild retropulsion. 3. 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 Mild spinal cord edema is not excluded with assessment limited by motion. Electronically signed by: Dasie Hamburg MD 04/08/2024 04:17 PM EST RP Workstation: [id number]   MR Lumbar Spine W Wo Contrast Result Date: 03/27/2024 EXAM: MR Lumbar Spine with and without intravenous contrast. 03/27/2024 04:42:52 PM TECHNIQUE: Multiplanar multisequence MRI of the lumbar spine was performed with and without the administration of  intravenous contrast. COMPARISON: MRI of the lumbar spine 17/24. CLINICAL HISTORY: Metastatic disease evaluation. Chronic back pain. History of prostate cancer. Recent PET scan shown metastases. Seen at [facility] and given oxy but no relief. FINDINGS: BONES AND ALIGNMENT: Progressive metastases are present throughout the lumbar and visualized lower thoracic spine. Diffuse enhancement is present throughout the L2 vertebral body. A 15 mm lesion is present along the posterior inferior L2 vertebral body. A lesion anteriorly on the left at L4 measures up to 21 mm. Enhancing lesion extends through the left pedicle of L2 and into the posterior elements. A left lateral lesion at T12 extends into the left pedicle measuring 3.4 cm. No extraosseous extension of metastases is present. No pathologic fractures are present. Normal alignment. SPINAL CORD: The conus terminates normally. SOFT TISSUES: No acute abnormality. T12-L1: A left lateral lesion at T12 extends into the left pedicle measuring 3.4 cm. No disc herniation. No spinal canal stenosis or neural foraminal narrowing. L1-L2: No disc herniation. No spinal canal stenosis or neural foraminal narrowing. L2-L3: Diffuse enhancement is present throughout the L2 vertebral body. A 15 mm lesion is present along the posterior inferior L2 vertebral body. Enhancing lesion extends through the left pedicle of L2 and into the posterior elements. No disc herniation. No spinal canal stenosis or neural foraminal narrowing. L3-L4: A broad-based disc protrusion is present. Moderate foraminal narrowing has progressed bilaterally, right greater than left. L4-L5: A lesion anteriorly on the left at L4 measures up to 21 mm. A broad-based disc protrusion and moderate facet hypertrophy is present. Progressive moderate subarticular and foraminal stenosis is worse right than left. L5-S1: A rightward disc protrusion and moderate facet hypertrophy results in progressive moderate right and mild left foraminal  stenosis similar to the prior study. IMPRESSION: 1. Progressive osseous metastases throughout the lumbar and visualized lower thoracic spine without extraosseous extension or pathologic fracture. 2. Progressive moderate foraminal narrowing at L3-4, right greater than left. 3. Progressive moderate subarticular and foraminal stenosis at L4-5, worse on the right. 4. Progressive moderate right and mild left foraminal stenosis at L5-S1, similar to prior study. Electronically signed by: Lonni Necessary MD 03/27/2024 05:06 PM EDT RP Workstation: [id number]

## 2024-04-21 ENCOUNTER — Other Ambulatory Visit: Payer: Self-pay

## 2024-04-21 ENCOUNTER — Ambulatory Visit

## 2024-04-21 DIAGNOSIS — R531 Weakness: Secondary | ICD-10-CM | POA: Diagnosis not present

## 2024-04-21 DIAGNOSIS — Z7189 Other specified counseling: Secondary | ICD-10-CM

## 2024-04-21 DIAGNOSIS — C7951 Secondary malignant neoplasm of bone: Secondary | ICD-10-CM | POA: Diagnosis not present

## 2024-04-21 DIAGNOSIS — Z79899 Other long term (current) drug therapy: Secondary | ICD-10-CM

## 2024-04-21 DIAGNOSIS — F1010 Alcohol abuse, uncomplicated: Secondary | ICD-10-CM

## 2024-04-21 DIAGNOSIS — Z515 Encounter for palliative care: Secondary | ICD-10-CM

## 2024-04-21 DIAGNOSIS — C61 Malignant neoplasm of prostate: Secondary | ICD-10-CM | POA: Diagnosis not present

## 2024-04-21 DIAGNOSIS — G893 Neoplasm related pain (acute) (chronic): Secondary | ICD-10-CM

## 2024-04-21 DIAGNOSIS — R296 Repeated falls: Secondary | ICD-10-CM | POA: Diagnosis not present

## 2024-04-21 LAB — RAD ONC ARIA SESSION SUMMARY
Course Elapsed Days: 11
Plan Fractions Treated to Date: 2
Plan Prescribed Dose Per Fraction: 3.5 Gy
Plan Total Fractions Prescribed: 4
Plan Total Prescribed Dose: 14 Gy
Reference Point Dosage Given to Date: 7 Gy
Reference Point Session Dosage Given: 3.5 Gy
Session Number: 6

## 2024-04-21 MED ORDER — ORAL CARE MOUTH RINSE: 15.0000 mL | OROMUCOSAL | Status: DC | PRN

## 2024-04-21 NOTE — Assessment & Plan Note (Addendum)
 Will continue to finish palliative radiation If recovering well and able to ambulate, better performance status, recommend outpatient PSMA PET to assess eligibility for Pluvicto Continue palliative care engagement and also follow-up as outpatient Currently not chemotherapy of Pluvicto candidate given cirrhosis with ongoing alcohol use. Hospice will be a alternative to prevent further complication from malignancy and is reasonable to preserve some quality of life.

## 2024-04-21 NOTE — Plan of Care (Signed)
  Problem: Education: Goal: Knowledge of General Education information will improve Description: Including pain rating scale, medication(s)/side effects and non-pharmacologic comfort measures Outcome: Progressing   Problem: Health Behavior/Discharge Planning: Goal: Ability to manage health-related needs will improve Outcome: Progressing   Problem: Clinical Measurements: Goal: Respiratory complications will improve Outcome: Progressing Goal: Cardiovascular complication will be avoided Outcome: Progressing   Problem: Clinical Measurements: Goal: Ability to maintain clinical measurements within normal limits will improve Outcome: Not Progressing Goal: Will remain free from infection Outcome: Not Progressing Goal: Diagnostic test results will improve Outcome: Not Progressing

## 2024-04-21 NOTE — Progress Notes (Addendum)
 David Townsend   DOB:1960-12-27   FM#:[id number]    ASSESSMENT & PLAN:   David Townsend is 63 year old male with history of prostate cancer with metastasis to bone, anemia of chronic medical disease, HTN, HLD, GERD, pain of malignancy with chronic opioid dependence,, daily tobacco/alcohol use disorder who presented with multiple complaints including generalized weakness, frequent falls at home. Per chart review he was discharged on 11/7, with findings of progressive disease from his prostate cancer. He was started on palliative radiation.   Per patient pain is still not under control .  He needs extra Dilaudid  dose before going down for radiation.  Appreciate internal medicine and palliative care team management.  We discussed again the diagnosis of metastatic CRPC and future management plan.  Given he has progressed on apalutamide , with ADT, further treatment will likely have shorter progression free survival.  All treatment will be palliative in nature.  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  We discussed his mobility, performance status needs to be improved.  Normally will follow by outpatient PSMA PET, to evaluate for candidacy for Pluvicto.  If not, then chemotherapy along with genetic testing, and molecular testing to search for potential treatment options.  He has history of cirrhosis, daily alcohol use, tobacco use.  We discussed the importance of alcohol cessation.  He he likely will experience more complication, hospitalization, emergency room visits, and also shorter survival if continue to drink alcohol.  Chemotherapy and Pluvicto or other systemic treatment would not be safe with daily alcohol drinking.  Continue cancer treatment is also long-term follow-up needed repeated visits.  Patient voiced he wants to continue alcohol drink.  In this case, I told him he will suffer from additional complications.  When I raise this issue, he does not want to speak  further.  At this time, oncology does not have further recommendation.  If quality of life, and able to enjoy alcohol drink is priority, then hospice is very reasonable.  Given his imaging suggestion of cirrhosis, complications, early death may result from combination of alcohol and chemotherapy.  Docetaxel has risk of liver toxicity.  Pluvicto is a IV systemic radioligand therapy.  It also has potential for increased LFT, and complications. Assessment & Plan Prostate cancer [facility]) Will continue to finish palliative radiation If recovering well and able to ambulate, better performance status, recommend outpatient PSMA PET to assess eligibility for Pluvicto Continue palliative care engagement and also follow-up as outpatient Currently not chemotherapy of Pluvicto candidate given cirrhosis with ongoing alcohol use. Hospice will be a alternative to prevent further complication from malignancy and is reasonable to preserve some quality of life. Weakness Continue to work with rehab Cancer associated pain Continue palliative radiation Recommend him to ask as needed medication for pain control before radiation. Appreciate hospital medicine and palliative care support ETOH abuse Patient wishes to continue drinking. Goals of care, counseling/discussion Discussed today.  Detail as above.   All questions were answered.    Thank you for the consult. Will sign off for now.  Please call me if any questions.  Pauletta JAYSON Chihuahua, MD 04/21/2024 4:44 PM  Subjective:  David Townsend reports feeling pain in the back.  He needed extra medication to go to radiation.  No active chest pain, abdominal pain.  Foley in place with clear urine.  Objective:  Vitals:   04/21/24 0522 04/21/24 1325  BP: 123/64 133/71  Pulse: 84 91  Resp: 14 20  Temp: 98.6 F (37 C) 98.2 F (36.8  C)  SpO2: 91% 96%     Intake/Output Summary (Last 24 hours) at 04/21/2024 1644 Last data filed at 04/21/2024 1457 Gross per 24 hour  Intake  --  Output 1325 ml  Net -1325 ml    GENERAL: alert, no distress SKIN: skin color normal LUNGS: No wheeze, rales and clear to auscultation bilaterally with normal breathing effort.  HEART: regular rate & rhythm  ABDOMEN: abdomen soft, non-tender and non-distended Musculoskeletal: no lower extremity edema NEURO: alert with fluent speech, no focal motor deficits   Labs:  Recent Labs    11/28/23 1957 03/27/24 1438 04/15/24 0626 04/16/24 0700 04/17/24 0709 04/18/24 0604 04/19/24 0632  NA  --    < > 138 139 136 137 137  K  --    < > 3.7 3.2* 3.7 4.2 4.3  CL  --    < > 103 101 97* 99 98  CO2  --    < > 21* 27 26 24 27   GLUCOSE  --    < > 118* 108* 87 107* 111*  BUN  --    < > 18 13 11 13 19   CREATININE  --    < > 0.65 0.55* 0.61 0.63 0.81  CALCIUM   --    < > 9.6 9.9 9.8 10.0 10.1  GFRNONAA  --    < > >60 >60 >60 >60 >60  PROT 8.6*   < > 6.5 6.4*  --   --  7.1  ALBUMIN 5.2*   < > 3.5 3.5  --   --  4.0  AST 29   < > 93* 46*  --   --  24  ALT 27   < > 64* 58*  --   --  35  ALKPHOS 147*   < > 174* 165*  --   --  167*  BILITOT 1.2   < > 0.3 0.3  --   --  0.4  BILIDIR 0.1  --   --   --   --   --  0.2  IBILI 1.1*  --   --   --   --   --  0.2*   < > = values in this interval not displayed.    Studies:  MR CERVICAL SPINE W WO CONTRAST Result Date: 04/14/2024 EXAM: MRI CERVICAL SPINE WITH AND WITHOUT CONTRAST 04/14/2024 02:57:42 PM TECHNIQUE: Multiplanar multisequence MRI of the cervical spine was performed without and with the administration of intravenous contrast. 8 mL (gadobutrol (GADAVIST) 1 MMOL/ML injection 8 mL GADOBUTROL 1 MMOL/ML IV SOLN). COMPARISON: MRI thoracic spine 04/14/2024 and 04/08/2024. No prior cervical spine imaging. CLINICAL HISTORY: Metastatic disease evaluation. FINDINGS: BONES AND ALIGNMENT: Mild reversal of the normal cervical lordosis. No significant listhesis. Preserved cervical vertebral body heights without a definite fracture identified. Widespread  abnormal bone marrow signal consistent with known metastatic disease, most notable throughout the mid and lower cervical and included upper thoracic spine with skull base involvement noted as well (left occipital condyle). Prominent involvement of the right sided posterior elements at C5. SPINAL CORD: Normal spinal cord size. Normal spinal cord signal. SOFT TISSUES: No paraspinal mass. Mildly prominent smooth dorsal epidural enhancement in a bilateral and symmetric fashion throughout the cervical spine as well as slightly more prominent dorsal and ventral epidural enhancement in the included upper thoracic spine. No masslike epidural enhancement. DISC LEVELS: C2-C3: Mild facet arthrosis without disc herniation or stenosis. C3-C4: Severe disc space narrowing. A broad based posterior disc osteophyte  complex and mild facet arthrosis result in mild left sided spinal stenosis and mild to moderate right and severe left neural foraminal stenosis. C4-C5: Severe disc space narrowing. A broad based posterior disc osteophyte complex and mild facet arthrosis result in mild spinal stenosis and severe right and moderate left neural foraminal stenosis. C5-C6: Moderate disc space narrowing. Disc bulging, uncovertebral spurring, and mild facet arthrosis result in mild spinal stenosis and severe bilateral neural foraminal stenosis. C6-C7: Severe disc space narrowing. A broad based posterior disc osteophyte complex and mild facet arthrosis result in mild spinal stenosis and severe bilateral neural foraminal stenosis. C7-T1: Severe facet arthrosis results in borderline to mild right and mild to moderate left neural foraminal stenosis without spinal stenosis. IMPRESSION: 1. Osseous metastatic disease throughout the cervical and including upper thoracic spine as well as involving the skull base. 2. Mildly prominent dorsal epidural enhancement throughout the cervical spine as well as ventral and dorsal epidural enhancement in the included  upper thoracic spine. This is favored to reflect prominent epidural venous plexus, although small volume epidural tumor is not excluded. 3. Widespread disc degeneration with mild multilevel spinal stenosis and severe neural foraminal stenosis as above. Electronically signed by: Dasie Hamburg MD 04/14/2024 04:56 PM EST RP Workstation: [id number]   MR THORACIC SPINE W WO CONTRAST Result Date: 04/14/2024 EXAM: MRI THORACIC SPINE WITHOUT AND WITH INTRAVENOUS CONTRAST 04/14/2024 02:57:42 PM TECHNIQUE: Multiplanar multisequence MRI of the thoracic spine was performed without and with the administration of 8 mL of gadobutrol (GADAVIST) 1 MMOL/ML intravenous contrast. COMPARISON: MRI thoracic spine 04/08/2024. CLINICAL HISTORY: Myelopathy, acute, thoracic spine. FINDINGS: LIMITATIONS: The examination is motion degraded, including moderate to severe motion on the axial T2 gradient echo sequence and mild to moderate motion on other sequences. BONES AND ALIGNMENT: Normal alignment. Extensive, widespread metastatic disease throughout the vertebral bodies and posterior elements of the thoracic spine has not significantly changed from the recent prior MRI. Multiple compression fractures are again seen as detailed on the prior MRI without evidence of interval progression, and this includes a T8 fracture with 30% height loss and mild retropulsion. There is also mild retropulsion at T4 and T6. Assessment for marrow edema is again limited due to widespread marrow signal abnormality. Mildly prominent ventral epidural enhancement throughout the upper and mid thoracic spine is similar to the prior MRI and again favored to predominantly reflect prominent epidural venous plexus, although small volume epidural tumor is difficult to exclude. No masslike epidural enhancement is present. SPINAL CORD: Questionable mild hazy T2/STIR hyperintensity throughout the mid thoracic spinal cord, although this is most conspicuous on the sagittal STIR  sequence and is not clearly confirmed on the standard sagittal and axial T2 sequences and may be artifactual. SOFT TISSUES: Dorsal epidural lipomatosis is again noted and results in spinal stenosis with essentially complete CSF effacement from T3-T8 with mild mass effect on the spinal cord as before. IMPRESSION: 1. Unchanged appearance of widespread metastatic disease in the thoracic spine with multiple nonprogressive pathologic compression fractures. 2. Unchanged mildly prominent epidural enhancement in the upper and mid thoracic spine, again favored to predominantly reflect prominent epidural venous plexus with small volume epidural tumor not excluded. No mass-like epidural enhancement. 3. Unchanged severe spinal stenosis with CSF effacement from T3-T8 predominantly due to epidural lipomatosis. Low level spinal cord edema is not excluded. Electronically signed by: Dasie Hamburg MD 04/14/2024 04:37 PM EST RP Workstation: [id number]   DG CHEST PORT 1 VIEW Result Date: 04/14/2024 CLINICAL DATA:  Pneumonia . EXAM:  PORTABLE CHEST 1 VIEW COMPARISON:  04/13/2024 FINDINGS: The cardio pericardial silhouette is enlarged. Diffuse interstitial and patchy airspace disease again noted with progression of airspace disease in the left apex and right mid and lower lung. No substantial pleural effusion. The cardio pericardial silhouette is enlarged. Telemetry leads overlie the chest. Gaseous distention of colon noted in the visualized upper abdomen. IMPRESSION: Diffuse interstitial and patchy airspace disease with progression of airspace disease in the left apex and right mid and lower lung. Electronically Signed   By: Camellia Candle M.D.   On: 04/14/2024 07:45   DG Chest Port 1 View Result Date: 04/13/2024 CLINICAL DATA:  Shortness of breath, chest pain and back pain. Fall. EXAM: PORTABLE CHEST 1 VIEW COMPARISON:  04/10/2024 and CT chest 11/27/2023. FINDINGS: Trachea is midline. Heart size within normal limits. Diffuse hazy  airspace opacification, somewhat upper and midlung zone predominant and progressive from 04/10/2024. No pleural effusions. IMPRESSION: Increasing hazy upper and midlung zone predominant airspace opacification may be due to a viral or atypical pneumonia. Electronically Signed   By: Newell Eke M.D.   On: 04/13/2024 12:53   DG CHEST PORT 1 VIEW Result Date: 04/10/2024 EXAM: 1 VIEW(S) XRAY OF THE CHEST 04/10/2024 07:07:00 AM COMPARISON: 11/26/2023 CLINICAL HISTORY: Dyspnea FINDINGS: LUNGS AND PLEURA: Low lung volumes. Bilateral ill defined peripheral and basilar airspace opacities. Mild predominantly perihilar interstitial prominence. Possible small left pleural effusion. No pulmonary edema. No pneumothorax. HEART AND MEDIASTINUM: No acute abnormality of the cardiac and mediastinal silhouettes. BONES AND SOFT TISSUES: No acute osseous abnormality. IMPRESSION: 1. Peripheral and basilar airspace opacities with mild predominantly perihilar interstitial prominence, compatible with multifocal infection or inflammation. 2. Low lung volumes. 3. Possible small left pleural effusion. Electronically signed by: Katheleen Faes MD 04/10/2024 11:13 AM EST RP Workstation: [id number]   MR THORACIC SPINE W WO CONTRAST Result Date: 04/08/2024 EXAM: MRI THORACIC SPINE WITH AND WITHOUT INTRAVENOUS CONTRAST 04/08/2024 03:43:35 PM TECHNIQUE: Multiplanar multisequence MRI of the thoracic spine was performed with and without the administration of intravenous contrast. COMPARISON: MRI thoracic spine 09/19/2022. MRI lumbar spine 03/27/2024. CTA chest, abdomen, and pelvis 11/27/2023. CLINICAL HISTORY: Metastatic disease evaluation. Severe back pain. History of metastatic prostate cancer. FINDINGS: BONES AND ALIGNMENT: There is widespread metastatic disease involving every level in the thoracic spine which has greatly progressed from the prior thoracic spine MRI. There is complete or near complete marrow replacement in the T2 through T8  vertebral bodies with large lesions noted at T11 and T12 as well. Posterior element involvement is particularly extensive from T4 to T6. Multiple compression fractures are present. A T4 compression fracture is new from the prior thoracic MRI but was present on the interval chest CT with mildly progressive vertebral body height loss, now measuring 25%. A mild T3 compression fracture is new or progressive, with 10% vertebral body height loss. There are new T5 and T6 compression fractures with 20% height loss each. A T8 compression fracture is new or progressive with 30% height loss. Assessment for marrow edema associated with these fractures is limited due to the underlying widespread marrow signal abnormality; however, there is a visible unhealed fracture line within the T8 vertebral body extending to the posterior cortex. T8 retropulsion measures 2 to 3 mm. Mildly prominent epidural enhancement, most prominent ventrally from T5 to T8, is favored to reflect prominent epidural venous plexus without masslike enhancement seen to clearly indicate epidural tumor, although minimal epidural tumor in this region is not excluded. SPINAL CORD: There is apparent  mild hyperintensity throughout the majority of the thoracic spinal cord on the sagittal STIR sequence which is not clearly confirmed on the standard sagittal T2 sequence. Assessment of the spinal cord on axial images is limited due to severe motion artifact. SOFT TISSUES: Dorsal epidural lipomatosis has increased from the prior thoracic spine MRI and results in spinal stenosis with essentially complete CSF effacement from T3 to T8. There is mild mass effect on the spinal cord in the region from T3 to T8. IMPRESSION: 1. Progressive, widespread metastatic disease throughout the thoracic spine. 2. Multiple new or progressive pathologic compression fractures, most notably at T8 where there is an unhealed fracture with 30% height loss and mild retropulsion. 3. Increased  dorsal epidural lipomatosis causing spinal stenosis with complete CSF effacement from T3T8 and mild spinal cord mass effect. Mild spinal cord edema is not excluded with assessment limited by motion. Electronically signed by: Dasie Hamburg MD 04/08/2024 04:17 PM EST RP Workstation: [id number]   MR Lumbar Spine W Wo Contrast Result Date: 03/27/2024 EXAM: MR Lumbar Spine with and without intravenous contrast. 03/27/2024 04:42:52 PM TECHNIQUE: Multiplanar multisequence MRI of the lumbar spine was performed with and without the administration of intravenous contrast. COMPARISON: MRI of the lumbar spine 17/24. CLINICAL HISTORY: Metastatic disease evaluation. Chronic back pain. History of prostate cancer. Recent PET scan shown metastases. Seen at [facility] and given oxy but no relief. FINDINGS: BONES AND ALIGNMENT: Progressive metastases are present throughout the lumbar and visualized lower thoracic spine. Diffuse enhancement is present throughout the L2 vertebral body. A 15 mm lesion is present along the posterior inferior L2 vertebral body. A lesion anteriorly on the left at L4 measures up to 21 mm. Enhancing lesion extends through the left pedicle of L2 and into the posterior elements. A left lateral lesion at T12 extends into the left pedicle measuring 3.4 cm. No extraosseous extension of metastases is present. No pathologic fractures are present. Normal alignment. SPINAL CORD: The conus terminates normally. SOFT TISSUES: No acute abnormality. T12-L1: A left lateral lesion at T12 extends into the left pedicle measuring 3.4 cm. No disc herniation. No spinal canal stenosis or neural foraminal narrowing. L1-L2: No disc herniation. No spinal canal stenosis or neural foraminal narrowing. L2-L3: Diffuse enhancement is present throughout the L2 vertebral body. A 15 mm lesion is present along the posterior inferior L2 vertebral body. Enhancing lesion extends through the left pedicle of L2 and into the posterior elements. No disc  herniation. No spinal canal stenosis or neural foraminal narrowing. L3-L4: A broad-based disc protrusion is present. Moderate foraminal narrowing has progressed bilaterally, right greater than left. L4-L5: A lesion anteriorly on the left at L4 measures up to 21 mm. A broad-based disc protrusion and moderate facet hypertrophy is present. Progressive moderate subarticular and foraminal stenosis is worse right than left. L5-S1: A rightward disc protrusion and moderate facet hypertrophy results in progressive moderate right and mild left foraminal stenosis similar to the prior study. IMPRESSION: 1. Progressive osseous metastases throughout the lumbar and visualized lower thoracic spine without extraosseous extension or pathologic fracture. 2. Progressive moderate foraminal narrowing at L3-4, right greater than left. 3. Progressive moderate subarticular and foraminal stenosis at L4-5, worse on the right. 4. Progressive moderate right and mild left foraminal stenosis at L5-S1, similar to prior study. Electronically signed by: Lonni Necessary MD 03/27/2024 05:06 PM EDT RP Workstation: [id number]

## 2024-04-21 NOTE — TOC Progression Note (Signed)
 Transition of Care [facility]) - Progression Note    Patient Details  Name: Amaziah Raisanen MRN: [id number] Date of Birth: 1960-10-28  Transition of [facility]) CM/SW Contact  Heather DELENA Saltness, LCSW Phone Number: 04/21/2024, 12:38 PM  Clinical Narrative:    Palliative care team following. Pt considering hospice services, but final decision has not been made. TOC will continue to follow.    Expected Discharge Plan: Skilled Nursing Facility Barriers to Discharge: Continued Medical Work up   Expected Discharge Plan and Services In-house Referral: NA Discharge Planning Services: CM Consult Post Acute Care Choice: NA Living arrangements for the past 2 months: Single Family Home                 DME Arranged: N/A DME Agency: NA       HH Arranged: NA HH Agency: NA         Social Drivers of Health (SDOH) Interventions SDOH Screenings   Food Insecurity: No Food Insecurity (04/13/2024)  Housing: Low Risk  (04/13/2024)  Transportation Needs: No Transportation Needs (04/13/2024)  Utilities: Not At Risk (04/13/2024)  Alcohol Screen: Medium Risk (12/04/2023)  Depression (PHQ2-9): Low Risk  (12/04/2023)  Tobacco Use: High Risk (04/13/2024)    Readmission Risk Interventions    04/16/2024    1:08 PM  Readmission Risk Prevention Plan  Transportation Screening Complete  Medication Review (RN Care Manager) Referral to Pharmacy  PCP or Specialist appointment within 3-5 days of discharge Complete  HRI or Home Care Consult Complete  SW Recovery Care/Counseling Consult Complete  Palliative Care Screening Not Applicable  Skilled Nursing Facility Complete    Signed: Heather Saltness, MSW, LCSW Clinical Social Worker Inpatient Care Management 04/21/2024 12:40 PM

## 2024-04-21 NOTE — Assessment & Plan Note (Signed)
 Continue palliative radiation Recommend him to ask as needed medication for pain control before radiation. Appreciate hospital medicine and palliative care support

## 2024-04-21 NOTE — Assessment & Plan Note (Addendum)
 Patient wishes to continue drinking.

## 2024-04-21 NOTE — Progress Notes (Addendum)
 Progress Note   Patient: David Townsend [name] DOB: 09-Jul-1960 DOA: 04/13/2024     8 DOS: the patient was seen and examined on 04/21/2024  at 8:18AM      Brief hospital course: 63 y.o. M with castration resistant metastatic prostate cancer, HTN, and alcohol use who presented with falls.  Recently hospitalized for uncontrolled cancer pain, medications titrated and he was discharged home.  However, at home, he was too weak to care for himself, fell multiples times and so was btought back to the hospital.  Here, he developed severe delirium on hospital day 2, which has improved but he remains extremely weak and with some short term memory issues/processing deficits.      Assessment and Plan: Acute metabolic encephalopathy  Etiology is not entirely clear.  The timing of onset suggest this is not from morphine  or Ativan .  Given his reported alcohol use, suspect he may have had some alcohol withdrawal.  Our hope was to obtain an MRI brain to rule out tumor invasion, or stroke, but he has not been able to tolerate MRI scanner on multiple occasions.    Given its persistence, I suspect this is some cancer related delirium.   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 Roda Lauture.  He has had progression of his cancer, and Urology recommended medical oncology consultation.  In addition, radiation oncology were consulted for palliative treatment of his spine mets, and he is about halfway through a 10 treatment Radx course.  Overall, his prognosis is poor, and mentation/physical function are the primary limiting factors.  We have shared with patient and his two [place] (friend Medford Hoover  and friend [name]) that if he can improve in mental function and physical function enough to see Dr. Tina Oncology in the office, they can possibly pursue treatmetn, but if he does not improve (or gets worse) we would recommend Hospice. - Continue apalutamide , Decadron  - Continue morphine  - Consult palliative care, medical oncology, radiation oncology, and urology - Plan for rehab, then follow-up with Medical Oncology as an outpatient  - Will need palliative care to follow at SNF    Community-acquired versus atypical pneumonia Treated with 5 days Rocephin  and azithromycin, now resolved.   HTN Blood pressure normal - Continue Lasix , irbesartan , Norvasc    HLD - Lipitor held due to transaminitis, worsening prognosis    Transaminitis  Resolved           Subjective: No significant change in his mental status.  Tolerated radiation yesterday with higher doses of opiates.  Oncology and palliative care have gone over goals of care again with the patient.     Physical Exam: BP 133/71 (BP Location: Left Arm)   Pulse 91   Temp 98.2 F (36.8 C) (Oral)   Resp 20   Ht 5' 9 (1.753 m)   Wt 67.9 kg   SpO2 96%   BMI 22.09 kg/m   Adult male, sitting up in bed, interactive and conversational, but tangential, and appears to have short-term memory and processing difficulties RRR, no murmurs, peripheral edema Respiratory rate normal, lungs diminished but no rales or wheezes appreciated Abdomen soft, no tenderness palpation, no guarding, no ascites Short-term  memory and processing difficulties remain, strength symmetric in upper and lower extremities bilaterally    Data Reviewed: Discussed with oncology and palliative care Comprehensive metabolic panel shows no changes, CBC unremarkable    Family communication: Discussed with POA [name]  Disposition: Status is: Inpatient 63 yo M with progressive metastatic prostate cancer  Admitted with pneumonia, weakness, confusion  developed severe delirium  Gradually improving, pneumonia resolved, but completing radiation therapy.  Once TOC secure safe SNF discharge, will discharge to short term rehab with palliative following.        Author: Lonni SHAUNNA Dalton, MD 04/21/2024 2:11 PM  For on call review [email or web address].

## 2024-04-21 NOTE — Assessment & Plan Note (Signed)
 Continue to work with rehab

## 2024-04-21 NOTE — Progress Notes (Signed)
 Physical Therapy Treatment Patient Details Name: David Townsend MRN: [id number] DOB: [birth year] Today's Date: 04/21/2024   History of Present Illness Pt is a 63 y.o. male presenting to [facility] ED for cancer-related pain and  multiple falls. Recently in  for same , DC 11/7. PMH significant for prostate cancer with spinal metastases, rib fx, anemia of chronic disease, GERD, HTN, HLD, cancer-related pain, tobacco use disorder and alcohol use disorder. Imaging shows progression of diffuse bone metastasis to skull, ribs, spine, pelvis, humerus, and femurs.    PT Comments  Pt agreeable to therapy, reports has TLSO at home, hasn't worn it, declines additional one be ordered in hospital. Encouraged and educated pt on benefit and use of TLSO, encouraged to have someone bring to hospital and pt verbalized understanding; unsure of carry over due to forgetfulness during session. Pt comes to sitting at bedside from long sitting position, pushing through BUE, self directed, supv with HOB elevated. Pt then returns to long sitting supine with supv, pushing through BUE. Pt then requests to attempt standing, again supv to come to sitting at bedside, reporting 2/10 pain. Pt powers to stand with min A, able to take steps forward, back and laterally at bedside, reporting 0/10 pain. Pt on feet for ~5 minutes total, returns to sitting and reports 8/10 pain in back, requiring min A to lift BLE Back into bed, again self directed to push back into long sitting and declines log roll. Pt continues to report 8/10 once supine in bed. Pt needing attention to task cues and sequencing cues throughout session. Pt with forgetfulness, asking therapist when radiation is 3 times during session and appointment time written on white board in room.   If plan is discharge home, recommend the following: A lot of help with bathing/dressing/bathroom;Assist for transportation;Help with stairs or ramp for entrance;A lot of help with walking and/or  transfers   Can travel by private vehicle     No  Equipment Recommendations  None recommended by PT    Recommendations for Other Services       Precautions / Restrictions Precautions Precautions: Fall Recall of Precautions/Restrictions: Impaired Required Braces or Orthoses: Other Brace Other Brace: TLSO was prescribed last admission - pt declines ordering new one and will call someone to bring his from home Restrictions Weight Bearing Restrictions Per Provider Order: No     Mobility  Bed Mobility Overal bed mobility: Needs Assistance Bed Mobility: Supine to Sit, Sit to Supine     Supine to sit: Supervision, HOB elevated Sit to supine: Supervision, Min assist, HOB elevated   General bed mobility comments: pt in long sitting in bed, pushes through BUE to mobilize to bedside with supv, returns to supine by pushing through BUE to slide back into position requiring supv for first transfer and min A on 2nd, self directed and not participating in log roll cues    Transfers Overall transfer level: Needs assistance Equipment used: Rolling walker (2 wheels) Transfers: Sit to/from Stand Sit to Stand: Min assist, From elevated surface           General transfer comment: cues for hand placement and BLE activation, min A to power up from elevated bed    Ambulation/Gait Ambulation/Gait assistance: Min assist   Assistive device: Rolling walker (2 wheels)   Gait velocity: decreased     General Gait Details: pt takes 3 steps forward, backward and laterally, minimal bil foot clearance to shuffling step progression, unable to leave bedside, reports pain 0/10 but increased to  8/10 with return to supine   Stairs             Wheelchair Mobility     Tilt Bed    Modified Rankin (Stroke Patients Only)       Balance Overall balance assessment: Needs assistance Sitting-balance support: Feet supported Sitting balance-Leahy Scale: Fair Sitting balance - Comments: not  challenged   Standing balance support: Bilateral upper extremity supported, During functional activity, Reliant on assistive device for balance Standing balance-Leahy Scale: Poor Standing balance comment: reliant on RW                            Communication Communication Communication: No apparent difficulties  Cognition Arousal: Alert Behavior During Therapy: Restless   PT - Cognitive impairments: No family/caregiver present to determine baseline                       PT - Cognition Comments: pt needing cues for sequencing, easily distracted needing cues for attention to task, asks when radiation is multiple times during session and appears unaware each time educated Following commands: Impaired Following commands impaired: Follows one step commands with increased time    Cueing Cueing Techniques: Verbal cues, Gestural cues, Tactile cues, Visual cues  Exercises      General Comments        Pertinent Vitals/Pain Pain Assessment Pain Assessment: 0-10 Pain Score:  (2/10 supine with HOB ~40 deg, 2/10 seated at bedside, 0/10 in standing, 8/10 with return to supine with HOB ~40 deg) Pain Location: back Pain Descriptors / Indicators: Discomfort Pain Intervention(s): Limited activity within patient's tolerance, Monitored during session, Relaxation, Patient requesting pain meds-RN notified    Home Living                          Prior Function            PT Goals (current goals can now be found in the care plan section) Progress towards PT goals: Progressing toward goals    Frequency    Min 2X/week      PT Plan      Co-evaluation              AM-PAC PT 6 Clicks Mobility   Outcome Measure  Help needed turning from your back to your side while in a flat bed without using bedrails?: A Little Help needed moving from lying on your back to sitting on the side of a flat bed without using bedrails?: A Little Help needed moving to  and from a bed to a chair (including a wheelchair)?: A Little Help needed standing up from a chair using your arms (e.g., wheelchair or bedside chair)?: A Little Help needed to walk in hospital room?: A Lot Help needed climbing 3-5 steps with a railing? : Total 6 Click Score: 15    End of Session Equipment Utilized During Treatment: Gait belt Activity Tolerance: Patient tolerated treatment well;Patient limited by pain Patient left: in bed;with call bell/phone within reach;with bed alarm set Nurse Communication: Mobility status;Precautions;Other (comment) (requested pt to have TLSO brought if desired) PT Visit Diagnosis: Unsteadiness on feet (R26.81);Pain     Time: 9068-9043 PT Time Calculation (min) (ACUTE ONLY): 25 min  Charges:    $Therapeutic Activity: 23-37 mins PT General Charges $$ ACUTE PT VISIT: 1 Visit  Tori Ersie Savino PT, DPT 04/21/24, 10:14 AM

## 2024-04-21 NOTE — Assessment & Plan Note (Addendum)
 Discussed today.  Detail as above.

## 2024-04-21 NOTE — Progress Notes (Signed)
 Daily Progress Note   Patient Name: David Townsend       Date: 04/21/2024 DOB: 1961/04/24  Age: 64 y.o. MRN#: [id number] Attending Physician: Jonel Lonni SQUIBB, * Primary Care Physician: Jaycee Greig PARAS, NP Admit Date: 04/13/2024 Length of Stay: 8 days  Reason for Consultation/Follow-up: Establishing goals of care  Subjective:   CC: Patient feels pain managed currently. Following up regarding complex medical decision making.   Subjective:  Reviewed EMR including recent documentation from hospitalist and PT.  At time of EMR review in past 24 hours patient has received as needed MS IR 15 mg x 2 doses and IV Dilaudid  1 mg x 1 dose.  Patient continues to receive MS Contin  60 mg every 8 hours scheduled and dexamethasone  scheduled as per radiation oncology. Patient has ACP documentation on file naming HCPOA- [name] and primary alternate- [name].  Discussed care with bedside RN for medical updates.  Also discussed care with oncologist for medical updates.  Presented to bedside to see patient.  Introduced myself as a member of the palliative medicine team though patient remembered this provider from prior interactions during previous hospitalization.  No visitors present at bedside.  Able to review patient's symptom burden at this time.  Patient feels his pain is currently managed with current regimen.  He notes that IV Dilaudid  before radiation does usually help him tolerate laying for the procedure though it can still hurt at times.  Otherwise medications have been helping throughout the day with pain management.  With permission, inquired about medical planning moving forward.  Patient noted the oncologist had come to discuss care with him yesterday though he was still confused about what this meant moving forward.  Spent time discussing patient's diagnosis of castration resistant prostate cancer meaning that his cancer has progressed despite hormone therapy.  Inquired  about how patient wants to spend his time moving forward.  Patient described that he wants to be able to be at home and go out with his buddies for a drink of alcohol when he wants to.  With this in mind, discussed pathways moving forward knowing that patient has a cancer that cannot be cured.  If patient pursues aggressive medical pathway, would need to regain strength with rehab and then pursue cancer directed therapies such as chemotherapy though this would be aimed at trying to keep the cancer at bay and not curing it.  Patient has been instructed that he would not be able to drink alcohol while receiving these really aggressive cancer directed medications.  Noted would require outpatient follow-up for patient and could have adverse effects that are frequently seen with chemotherapy.  Also discussed alternative medical pathway of focusing on patient's comfort moving forward.  Spent time discussing the support would be with hospice at home.  Patient was able to describe how his father had hospice at home at the end of life and how he had to be his father's caregiver.  Discussed this would apply to him as well and he would need someone to assist with caring for him at home since he lives alone.  Noted should patient decide he wants to focus on comfort moving forward, would not be seeking cancer directed therapies and would focus on enjoying quality time moving forward until the end of his life.  Spent time answering questions regarding both pathways.  Also wrote out the basics of these pathways on a piece of paper for patient to review.  Did inquire if patient wanted to call  [name] for this conversation which he stated he did not.  Discussed based on patient's ACP documentation, [name], is his HCPOA and so this would need to be discussed with her as well since she would also assist with following his medical wishes for care.  Patient stated that he thought both people listed on his form would be able to make  decisions 50-50.  Explained that [name] is HCPOA and Christophers primary alternate.  Patient feels he would want [name] to be HCPOA instead since they have known each other for 30+ years.  Acknowledged this and noted he consider if his documents need to be adjusted.  Patient did try to call Lonni while this provider was present in the room, though unable to reach him despite multiple attempts.  We also discussed patient's CODE STATUS.  Wrote down fax for patient as well to review.  Explained full code versus DNR/DNI.  Expressed concern that with his underlying metastatic prostate cancer, if he was sick and if his heart were to stop or he were to stop breathing, interventions such as cardiac resuscitation and intubation with mechanical ventilation would not lead to the good quality of life outcomes patient has described with his underlying metastatic cancer.  Recommended patient consider change of CODE STATUS to DNR/DNI.  Patient noted he would further consider.  Spent time providing emotional support via active listening.  All questions answered at that time.  Noted palliative medicine team to continue following with patient's medical journey..  Discussed care with hospitalist, oncologist, bedside RN, PT, and TOC to coordinate care.  Objective:   Vital Signs:  BP 123/64 (BP Location: Left Arm)   Pulse 84   Temp 98.6 F (37 C) (Oral)   Resp 14   Ht 5' 9 (1.753 m)   Wt 67.9 kg   SpO2 91%   BMI 22.09 kg/m   Physical Exam: General: NAD, alert, chronically ill-appearing Cardiovascular: RRR Respiratory: no increased work of breathing noted, not in respiratory distress Abdomen: not distended Skin: no rashes or lesions on visible skin Neuro: A&Ox4, following commands easily Psych: appropriately answers all questions  Assessment & Plan:   Assessment: Patient is a 63 year old male with a past medical history of castration resistant prostate cancer metastatic to  bone, hypertension, hyperlipidemia, GERD, tobacco use disorder, and alcohol use disorder was admitted on 04/13/2024 for management of generalized weakness associated with falls at home.  Patient had been recently hospitalized and discharged on 04/10/2024 for uncontrolled cancer pain.  Since admission, patient has received management for acute metabolic encephalopathy, cancer related pain, community-acquired versus atypical pneumonia, and transaminitis.,  Medicine team consulted to assist with complex medical decision making.  Recommendations/Plan: # Complex medical decision making/goals of care:  - Extensive discussion with patient as detailed above in HPI.  Patient tried to place friend, Lonni, on phone for conversation though unable to reach.  Patient did not want [name] on phone for conversation.  Discussed possible pathways for medical care moving forward in light of patient having castration resistant prostate cancer.  Discussed aggressive medical interventions such as rehab to regain strength with possible follow-up in the outpatient setting for cancer directed therapies that would be aimed at keeping the cancer at bay to not curing it.  Also discussed alternative pathway for medical care including focusing on comfort with support of hospice at home.  Patient has stated that he wants to be able to enjoy time at home and his friends including drinking alcohol moving forward for  his quality time.  Patient has been informed that he cannot consume alcohol if pursuing aggressive cancer directed therapies due to adverse effects that could occur if.  Patient is further considering which pathway for medical care he would like to pursue at this time.  Continuing current appropriate medical interventions.  Palliative medicine team continuing to engage in conversations as able and appropriate.  - Patient does have ACP documentation on file naming friend, [name], as HCPOA and primary alternate HCPOA,  [name].  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  -  Code Status: Full Code   - Discussed CODE STATUS extensively as detailed above in HPI.  Explained full code versus DNR/DNI.  Expressed concern that if patient were sick and if his heart were to stop or he would stop breathing, intervention such as cardiac resuscitation and intubation with mechanical ventilation in the setting of metastatic prostate cancer which cannot be cured would not lead to the good quality of life outcomes patient has described himself.  Currently remains full code.  # Symptom management: Patient is receiving these palliative interventions for symptom management with an intent to improve quality of life.   - Pain, in the setting of metastatic prostate cancer   - Continue MS Contin  60 mg every 8 hours scheduled   - Continue MS IR 15 mg every 4 hours as needed   - Continue IV Dilaudid  1 mg every 4 hours as needed breakthrough pain, particularly for premedication for radiation    - Constipation, in setting of receiving opioids   Last BM in EMR noted 04/20/2024.    - Continue senna 2 tab twice daily    - Continue MiraLAX 17 g twice daily  # Discharge Planning: To Be Determined   Discussed with: Patient, hospitalist, oncologist, TOC, bedside RN, PT  Thank you for allowing the palliative care team to participate in the care [name].  Tinnie Radar, DO Palliative Care Provider PMT # [phone]  If patient remains symptomatic despite maximum doses, please call PMT at [phone] between 0700 and 1900. Outside of these hours, please call attending, as PMT does not have night coverage.  Personally spent 65 minutes in patient care including extensive chart review (labs, imaging, progress/consult notes, vital signs), medically appropraite exam, discussed with treatment team, education to  patient, family, and staff, documenting clinical information, medication review and management, coordination of care, and available advanced directive documents.

## 2024-04-22 ENCOUNTER — Ambulatory Visit

## 2024-04-22 ENCOUNTER — Other Ambulatory Visit: Payer: Self-pay

## 2024-04-22 DIAGNOSIS — R296 Repeated falls: Secondary | ICD-10-CM | POA: Diagnosis not present

## 2024-04-22 LAB — RAD ONC ARIA SESSION SUMMARY
Course Elapsed Days: 12
Plan Fractions Treated to Date: 3
Plan Prescribed Dose Per Fraction: 3.5 Gy
Plan Total Fractions Prescribed: 4
Plan Total Prescribed Dose: 14 Gy
Reference Point Dosage Given to Date: 10.5 Gy
Reference Point Session Dosage Given: 3.5 Gy
Session Number: 7

## 2024-04-22 LAB — COMPREHENSIVE METABOLIC PANEL WITH GFR
ALT: 28 U/L (ref 0–44)
AST: 27 U/L (ref 15–41)
Albumin: 4.4 g/dL (ref 3.5–5.0)
Alkaline Phosphatase: 159 U/L — ABNORMAL HIGH (ref 38–126)
Anion gap: 15 (ref 5–15)
BUN: 26 mg/dL — ABNORMAL HIGH (ref 8–23)
CO2: 23 mmol/L (ref 22–32)
Calcium: 10.7 mg/dL — ABNORMAL HIGH (ref 8.9–10.3)
Chloride: 96 mmol/L — ABNORMAL LOW (ref 98–111)
Creatinine, Ser: 0.96 mg/dL (ref 0.61–1.24)
GFR, Estimated: >60 mL/min (ref >60)
Glucose, Bld: 120 mg/dL — ABNORMAL HIGH (ref 70–99)
Potassium: 4.9 mmol/L (ref 3.5–5.1)
Sodium: 134 mmol/L — ABNORMAL LOW (ref 135–145)
Total Bilirubin: 0.5 mg/dL (ref 0.0–1.2)
Total Protein: 7.8 g/dL (ref 6.5–8.1)

## 2024-04-22 LAB — CBC
HCT: 34.9 % — ABNORMAL LOW (ref 39.0–52.0)
Hemoglobin: 11.5 g/dL — ABNORMAL LOW (ref 13.0–17.0)
MCH: 38 pg — ABNORMAL HIGH (ref 26.0–34.0)
MCHC: 33 g/dL (ref 30.0–36.0)
MCV: 115.2 fL — ABNORMAL HIGH (ref 80.0–100.0)
Platelets: 280 K/uL (ref 150–400)
RBC: 3.03 MIL/uL — ABNORMAL LOW (ref 4.22–5.81)
RDW: 13.2 % (ref 11.5–15.5)
WBC: 14.9 K/uL — ABNORMAL HIGH (ref 4.0–10.5)
nRBC: 0 % (ref 0.0–0.2)

## 2024-04-22 LAB — AMMONIA: Ammonia: <13 umol/L (ref 9–35)

## 2024-04-22 LAB — TSH: TSH: 1.98 u[IU]/mL (ref 0.350–4.500)

## 2024-04-22 LAB — VITAMIN B12: Vitamin B-12: 1406 pg/mL — ABNORMAL HIGH (ref 180–914)

## 2024-04-22 MED ORDER — METOPROLOL TARTRATE 5 MG/5ML IV SOLN: 5.0000 mg | INTRAVENOUS | Status: DC | PRN

## 2024-04-22 MED ORDER — ALUM & MAG HYDROXIDE-SIMETH 200-200-20 MG/5ML PO SUSP
30.0000 mL | ORAL | Status: DC | PRN
  Administered 2024-04-22 – 2024-05-01 (×5): 30 mL via ORAL
  Filled 2024-04-22 (×5): qty 30

## 2024-04-22 MED ORDER — NICOTINE 21 MG/24HR TD PT24
21.0000 mg | MEDICATED_PATCH | Freq: Every day | TRANSDERMAL | Status: DC
  Administered 2024-04-22 – 2024-05-04 (×13): 21 mg via TRANSDERMAL
  Filled 2024-04-22 (×13): qty 1

## 2024-04-22 MED ORDER — HYDRALAZINE HCL 20 MG/ML IJ SOLN: 10.0000 mg | INTRAMUSCULAR | Status: DC | PRN

## 2024-04-22 NOTE — Progress Notes (Signed)
 Daily Progress Note   Patient Name: David Townsend       Date: 04/22/2024 DOB: 1960-09-16  Age: 63 y.o. MRN#: [id number] Attending Physician: David Burgess BROCKS, MD Primary Care Physician: David Greig PARAS, NP Admit Date: 04/13/2024 Length of Stay: 9 days  Reason for Consultation/Follow-up: Establishing goals of care  Subjective:   CC: Patient feels pain managed currently. Following up regarding complex medical decision making.   Subjective:  Reviewed EMR including recent documentation from hospitalist and oncologist.  Oncologist extensively discussed with patient cancer directed therapies moving forward.  Noted patient needed to abstain from alcohol to avoid additional complications if receiving that medication.  Presented to bedside to meet with patient.  No visitors present at bedside.  Able to follow-up on discussions patient had with oncologist yesterday and with this provider extensively about pathways for medical care moving forward.  Patient noted that he has decided he wants to pursue regaining strength to determine if he would be a candidate for cancer directed therapies in the outpatient setting.  Discussed patient must abstain from alcohol to receive any cancer directed therapies or could suffer from additional complications.  Patient noted that he is willing to try at this time and currently wants to focus on completing radiation and going to rehab.  Acknowledged this. Inquired if patient had further considered discussion regarding his CODE STATUS and he noted he wanted more time to consider this.  Patient shows signs of denial when discussing difficult topics though encouraged patient needs to have these discussions before he can no longer do so.   Inquired about patient's pain management.  Patient feels the current medications are assisting him well.  Discussed would continue oral medications at rehab.    Spent time providing emotional support via active listening.  Noted palliative  medicine team to continue to follow along with patient's medical journey.  Discussed care with hospitalist, TOC, and to RN to coordinate care.  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  Patient had been recently hospitalized and discharged on 04/10/2024 for uncontrolled cancer pain.  Since admission, patient has received management for acute metabolic encephalopathy, cancer related pain, community-acquired versus atypical pneumonia, and transaminitis.,  Medicine team consulted to assist with complex medical decision making.  Recommendations/Plan: # Complex medical decision making/goals of care:  - Followed up on prior extensive discussion with patient held on 04/21/2024.  Have explained pathways for medical care moving forward.  Oncologist was very clear that patient would have to regain strength and abstain from alcohol to be a candidate for further cancer directed therapies which would be palliative in nature, not curative.  Patient at this time electing to pursue going to rehab to regain strength for follow-up with oncology  in the outpatient setting.  Discussed patient can elect for hospice involvement should he  decide he wants to focus on comfort or is not a candidate for cancer directed therapies.  Palliative medicine team continuing to follow along with patient's medical journey.  - Patient does have ACP documentation on file naming friend, David Townsend, as HCPOA and primary alternate HCPOA, David Townsend.    -  Code Status: Full Code   - Have already CODE STATUS extensively.  Patient currently wishing to remain full code and did not wish to discuss CODE STATUS further today.  # Symptom management: Patient is receiving these palliative interventions for symptom management with an intent to improve quality of life.   - Pain, in the setting of metastatic prostate cancer   - Continue MS Contin  60 mg every 8 hours scheduled   - Continue MS IR 15 mg every 4 hours as needed   - Continue IV Dilaudid  1 mg every 4 hours as needed breakthrough pain, particularly for premedication for radiation.    - Constipation, in setting of receiving opioids   Last BM in EMR noted 04/20/2024.    - Continue senna 2 tab twice daily    - Continue MiraLAX 17 g twice daily  # Discharge Planning: Skilled Nursing Facility for rehab with Palliative care service follow-up   Discussed with: Patient, hospitalist, TOC, bedside RN, OT  Thank you for allowing the palliative care team to participate in the care David Townsend.  David Radar, DO Palliative Care Provider PMT # [phone]  If patient remains symptomatic despite maximum doses, please call PMT at [phone] between 0700 and 1900. Outside of these hours, please call attending, as PMT does not have night coverage.  Personally spent 35 minutes in patient care including extensive chart review (labs, imaging, progress/consult notes, vital signs), medically appropraite exam, discussed with treatment team, education to patient, family, and staff, documenting clinical information, medication review and management, coordination of care, and available advanced  directive documents.

## 2024-04-22 NOTE — Progress Notes (Addendum)
 PROGRESS NOTE    David Townsend  [name] DOB: February 19, 1961 DOA: 04/13/2024 PCP: Jaycee Greig PARAS, NP    Brief Narrative:   63 y.o. M with castration resistant metastatic prostate cancer, HTN, and alcohol use who presented with falls.   Recently hospitalized for uncontrolled cancer pain, medications titrated and he was discharged home.  However, at home, he was too weak to care for himself, fell multiples times and so was btought back to the hospital.  Here, he developed severe delirium on hospital day 2, which has improved but he remains extremely weak and with some short term memory issues/processing deficits.    Assessment & Plan:  Acute metabolic encephalopathy  Etiology has been somewhat unclear.  Patient has not been able to tolerate MRI, multiple attempts have been made by the previous provider.  There is concerns that that could be cancer related delirium therefore on Decadron  weaning protocol as mentioned below.  Repeat ammonia, B12, TSH, folate - Standard delirium precautions.   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  Overall patient does have poor prognosis.  Patient seen by oncology who is recommending to continue to finish palliative radiation.  Depending on progression, may offer further treatment otherwise continue to engage palliative care and consider hospice.   Last day of Radiation 11/20  Community-acquired versus atypical pneumonia Treated with 5 days Rocephin  and azithromycin, now resolved.   HTN  Norvasc , Avapro , Lasix .  IV as needed   HLD - Lipitor held due to transaminitis, worsening prognosis    Transaminitis  Resolved   DVT prophylaxis: enoxaparin  (LOVENOX )  injection 40 mg Start: 04/14/24 1000      Code Status: Full Code Family Communication:   Status is: Inpatient Remains inpatient appropriate because: Ongoing management for encephalopathy and radiation treatments   PT Follow up Recs: Skilled Nursing-Short Term Rehab (<3 Hours/Day)04/21/2024 1008  Subjective:  No new complaints, awaiting his radiation treatment at 1:30 PM today  Examination:  General exam: Appears calm and comfortable  Respiratory system: Clear to auscultation. Respiratory effort normal. Cardiovascular system: S1 & S2 heard, RRR. No JVD, murmurs, rubs, gallops or clicks. No pedal edema. Gastrointestinal system: Abdomen is nondistended, soft and nontender. No organomegaly or masses felt. Normal bowel sounds heard. Central nervous system: Alert and oriented to name and place. No focal neurological deficits. Extremities: Symmetric 5 x 5 power. Skin: No rashes, lesions or ulcers Psychiatry: Judgement and insight appear poor                Diet Orders (From admission, onward)     Start     Ordered   04/13/24 1418  Diet Heart Room service appropriate? Yes; Fluid consistency: Thin  Diet effective now       Question Answer Comment  Room service appropriate? Yes   Fluid consistency: Thin      04/13/24 1418            Objective: Vitals:   04/21/24 1325 04/21/24 2052 04/22/24 0547 04/22/24 1447  BP: 133/71 127/75 132/70 124/81  Pulse: 91 91 87 97  Resp: 20 18 16 18   Temp: 98.2 F (36.8 C) 98.6 F (37 C) 97.8 F (36.6 C) 97.9 F (  36.6 C)  TempSrc: Oral Oral Oral Oral  SpO2: 96% 96% 97% 100%  Weight:      Height:        Intake/Output Summary (Last 24 hours) at 04/22/2024 1534 Last data filed at 04/22/2024 0551 Gross per 24 hour  Intake --  Output 600 ml  Net -600 ml   Filed Weights   04/15/24 1739  Weight: 67.9 kg    Scheduled Meds:  amLODipine   10 mg Oral Daily   apalutamide   240 mg Oral Daily   Chlorhexidine  Gluconate Cloth  6  each Topical Daily   dexamethasone   4 mg Oral Q12H   enoxaparin  (LOVENOX ) injection  40 mg Subcutaneous Q24H   folic acid   1 mg Oral Daily   furosemide   20 mg Oral Daily   irbesartan   75 mg Oral Daily   morphine   60 mg Oral Q8H   multivitamin with minerals  1 tablet Oral Daily   polyethylene glycol  17 g Oral BID   potassium chloride   40 mEq Oral Daily   senna-docusate  2 tablet Oral BID   thiamine   100 mg Oral Daily   Or   thiamine   100 mg Intravenous Daily   Continuous Infusions:  Nutritional status     Body mass index is 22.09 kg/m.  Data Reviewed:   CBC: Recent Labs  Lab 04/16/24 0700 04/16/24 1912 04/18/24 0604 04/19/24 0632 04/22/24 0604  WBC 8.7 10.1 9.2 10.1 14.9*  HGB 10.4* 10.3* 10.9* 11.4* 11.5*  HCT 29.8* 30.3* 31.8* 33.7* 34.9*  MCV 110.8* 111.8* 111.6* 112.7* 115.2*  PLT 255 248 246 243 280   Basic Metabolic Panel: Recent Labs  Lab 04/16/24 0700 04/17/24 0709 04/18/24 0604 04/19/24 0632 04/22/24 0604  NA 139 136 137 137 134*  K 3.2* 3.7 4.2 4.3 4.9  CL 101 97* 99 98 96*  CO2 27 26 24 27 23   GLUCOSE 108* 87 107* 111* 120*  BUN 13 11 13 19  26*  CREATININE 0.55* 0.61 0.63 0.81 0.96  CALCIUM  9.9 9.8 10.0 10.1 10.7*   GFR: Estimated Creatinine Clearance: 75.6 mL/min (by C-G formula based on SCr of 0.96 mg/dL). Liver Function Tests: Recent Labs  Lab 04/16/24 0700 04/19/24 9367 04/22/24 0604  AST 46* 24 27  ALT 58* 35 28  ALKPHOS 165* 167* 159*  BILITOT 0.3 0.4 0.5  PROT 6.4* 7.1 7.8  ALBUMIN 3.5 4.0 4.4   No results for input(s): LIPASE, AMYLASE in the last 168 hours. Recent Labs  Lab 04/22/24 0839  AMMONIA <13   Coagulation Profile: No results for input(s): INR, PROTIME in the last 168 hours. Cardiac Enzymes: No results for input(s): CKTOTAL, CKMB, CKMBINDEX, TROPONINI in the last 168 hours. BNP (last 3 results) No results for input(s): PROBNP in the last 8760 hours. HbA1C: No results for input(s): HGBA1C  in the last 72 hours. CBG: No results for input(s): GLUCAP in the last 168 hours. Lipid Profile: No results for input(s): CHOL, HDL, LDLCALC, TRIG, CHOLHDL, LDLDIRECT in the last 72 hours. Thyroid  Function Tests: Recent Labs    04/22/24 0839  TSH 1.980   Anemia Panel: Recent Labs    04/22/24 0839  VITAMINB12 1,406*   Sepsis Labs: No results for input(s): PROCALCITON, LATICACIDVEN in the last 168 hours.  Recent Results (from the past 240 hours)  Urine Culture     Status: Abnormal   Collection Time: 04/13/24  1:18 PM   Specimen: Urine, Clean Catch  Result Value Ref Range Status  Specimen Description   Final    URINE, CLEAN CATCH Performed at [facility], [address], [place], [place] [address]    Special Requests   Final    Normal Performed at [facility], [address] [place], [place] [address]    Culture MULTIPLE SPECIES PRESENT, SUGGEST RECOLLECTION (A)  Final   Report Status 04/14/2024 FINAL  Final  Blood culture (routine x 2)     Status: None   Collection Time: 04/13/24  1:49 PM   Specimen: BLOOD RIGHT WRIST  Result Value Ref Range Status   Specimen Description   Final    BLOOD RIGHT WRIST Performed at [facility], [address] [place], [place] [address]    Special Requests   Final    BOTTLES DRAWN AEROBIC AND ANAEROBIC Blood Culture adequate volume Performed at [facility], [address] [place], [place] [address]    Culture   Final    NO GROWTH 5 DAYS Performed at [facility], [address] [place], [place] [address]    Report Status 04/18/2024 FINAL  Final  Blood culture (routine x 2)     Status: None   Collection Time: 04/13/24  1:49 PM   Specimen: BLOOD RIGHT FOREARM  Result Value Ref Range Status   Specimen Description   Final    BLOOD RIGHT FOREARM Performed at [facility], [address] [place], [place] [address]    Special Requests    Final    BOTTLES DRAWN AEROBIC AND ANAEROBIC Blood Culture adequate volume Performed at [facility], [address] [place], [place] [address]    Culture   Final    NO GROWTH 5 DAYS Performed at [facility], [address], [place], [place] [address]    Report Status 04/18/2024 FINAL  Final  Respiratory (~20 pathogens) panel by PCR     Status: None   Collection Time: 04/13/24  2:58 PM   Specimen: Nasopharyngeal Swab; Respiratory  Result Value Ref Range Status   Adenovirus NOT DETECTED NOT DETECTED Final   Coronavirus 229E NOT DETECTED NOT DETECTED Final    Comment: (NOTE) The Coronavirus on the Respiratory Panel, DOES NOT test for the novel  Coronavirus (2019 nCoV)    Coronavirus HKU1 NOT DETECTED NOT DETECTED Final   Coronavirus NL63 NOT DETECTED NOT DETECTED Final   Coronavirus OC43 NOT DETECTED NOT DETECTED Final   Metapneumovirus NOT DETECTED NOT DETECTED Final   Rhinovirus / Enterovirus NOT DETECTED NOT DETECTED Final   Influenza A NOT DETECTED NOT DETECTED Final   Influenza B NOT DETECTED NOT DETECTED Final   Parainfluenza Virus 1 NOT DETECTED NOT DETECTED Final   Parainfluenza Virus 2 NOT DETECTED NOT DETECTED Final   Parainfluenza Virus 3 NOT DETECTED NOT DETECTED Final   Parainfluenza Virus 4 NOT DETECTED NOT DETECTED Final   Respiratory Syncytial Virus NOT DETECTED NOT DETECTED Final   Bordetella pertussis NOT DETECTED NOT DETECTED Final   Bordetella Parapertussis NOT DETECTED NOT DETECTED Final   Chlamydophila pneumoniae NOT DETECTED NOT DETECTED Final   Mycoplasma pneumoniae NOT DETECTED NOT DETECTED Final    Comment: Performed at [facility], [address] [place], [place] [address]         Radiology Studies: No results found.         LOS: 9 days   Time spent= 35 mins    Burgess JAYSON Dare, MD [facility]  If 7PM-7AM, please contact night-coverage  04/22/2024, 3:34 PM

## 2024-04-22 NOTE — Progress Notes (Signed)
 OT Cancellation Note  Patient Details Name: David Townsend MRN: [id number] DOB: Jan 10, 1961   Cancelled Treatment:    Reason Eval/Treat Not Completed: Fatigue/lethargy limiting ability to participate;Pain limiting ability to participate Patient politely declining as he just got back from radiation and is in an increased amount of pain. OT will follow back as time permits.   Ronal Gift E. Nachum Derossett, OTR/L Acute Rehabilitation Services [phone]   Ronal Gift Salt 04/22/2024, 3:16 PM

## 2024-04-22 NOTE — TOC Progression Note (Signed)
 Transition of Care [facility]) - Progression Note    Patient Details  Name: David Townsend MRN: [id number] Date of Birth: 11-Nov-1960  Transition of Care [facility]) CM/SW Contact  Toy LITTIE Agar, RN Phone Number:[phone]  04/22/2024, 12:28 PM  Clinical Narrative:    Currently there are no bed offers. CM will extend bed search.    Expected Discharge Plan: Skilled Nursing Facility Barriers to Discharge: Continued Medical Work up               Expected Discharge Plan and Services In-house Referral: NA Discharge Planning Services: CM Consult Post Acute Care Choice: NA Living arrangements for the past 2 months: Single Family Home                 DME Arranged: N/A DME Agency: NA       HH Arranged: NA HH Agency: NA         Social Drivers of Health (SDOH) Interventions SDOH Screenings   Food Insecurity: No Food Insecurity (04/13/2024)  Housing: Low Risk  (04/13/2024)  Transportation Needs: No Transportation Needs (04/13/2024)  Utilities: Not At Risk (04/13/2024)  Alcohol Screen: Medium Risk (12/04/2023)  Depression (PHQ2-9): Low Risk  (12/04/2023)  Tobacco Use: High Risk (04/13/2024)    Readmission Risk Interventions    04/16/2024    1:08 PM  Readmission Risk Prevention Plan  Transportation Screening Complete  Medication Review (RN Care Manager) Referral to Pharmacy  PCP or Specialist appointment within 3-5 days of discharge Complete  HRI or Home Care Consult Complete  SW Recovery Care/Counseling Consult Complete  Palliative Care Screening Not Applicable  Skilled Nursing Facility Complete

## 2024-04-23 ENCOUNTER — Ambulatory Visit

## 2024-04-23 ENCOUNTER — Inpatient Hospital Stay: Payer: Self-pay | Admitting: Nurse Practitioner

## 2024-04-23 ENCOUNTER — Other Ambulatory Visit: Payer: Self-pay

## 2024-04-23 ENCOUNTER — Encounter: Payer: Self-pay | Admitting: Radiation Oncology

## 2024-04-23 DIAGNOSIS — C61 Malignant neoplasm of prostate: Secondary | ICD-10-CM

## 2024-04-23 DIAGNOSIS — C7951 Secondary malignant neoplasm of bone: Secondary | ICD-10-CM | POA: Diagnosis not present

## 2024-04-23 DIAGNOSIS — R296 Repeated falls: Secondary | ICD-10-CM | POA: Diagnosis not present

## 2024-04-23 DIAGNOSIS — Z515 Encounter for palliative care: Secondary | ICD-10-CM | POA: Diagnosis not present

## 2024-04-23 LAB — COMPREHENSIVE METABOLIC PANEL WITH GFR
ALT: 31 U/L (ref 0–44)
AST: 33 U/L (ref 15–41)
Albumin: 4.2 g/dL (ref 3.5–5.0)
Alkaline Phosphatase: 156 U/L — ABNORMAL HIGH (ref 38–126)
Anion gap: 14 (ref 5–15)
BUN: 31 mg/dL — ABNORMAL HIGH (ref 8–23)
CO2: 24 mmol/L (ref 22–32)
Calcium: 10.7 mg/dL — ABNORMAL HIGH (ref 8.9–10.3)
Chloride: 96 mmol/L — ABNORMAL LOW (ref 98–111)
Creatinine, Ser: 1.08 mg/dL (ref 0.61–1.24)
GFR, Estimated: >60 mL/min (ref >60)
Glucose, Bld: 103 mg/dL — ABNORMAL HIGH (ref 70–99)
Potassium: 5.1 mmol/L (ref 3.5–5.1)
Sodium: 134 mmol/L — ABNORMAL LOW (ref 135–145)
Total Bilirubin: 0.4 mg/dL (ref 0.0–1.2)
Total Protein: 7.7 g/dL (ref 6.5–8.1)

## 2024-04-23 LAB — CBC
HCT: 34.9 % — ABNORMAL LOW (ref 39.0–52.0)
Hemoglobin: 11.8 g/dL — ABNORMAL LOW (ref 13.0–17.0)
MCH: 38.9 pg — ABNORMAL HIGH (ref 26.0–34.0)
MCHC: 33.8 g/dL (ref 30.0–36.0)
MCV: 115.2 fL — ABNORMAL HIGH (ref 80.0–100.0)
Platelets: 296 K/uL (ref 150–400)
RBC: 3.03 MIL/uL — ABNORMAL LOW (ref 4.22–5.81)
RDW: 13.2 % (ref 11.5–15.5)
WBC: 15.2 K/uL — ABNORMAL HIGH (ref 4.0–10.5)
nRBC: 0 % (ref 0.0–0.2)

## 2024-04-23 LAB — RAD ONC ARIA SESSION SUMMARY
Course Elapsed Days: 13
Plan Fractions Treated to Date: 4
Plan Prescribed Dose Per Fraction: 3.5 Gy
Plan Total Fractions Prescribed: 4
Plan Total Prescribed Dose: 14 Gy
Reference Point Dosage Given to Date: 14 Gy
Reference Point Session Dosage Given: 3.5 Gy
Session Number: 8

## 2024-04-23 LAB — PHOSPHORUS: Phosphorus: 3.8 mg/dL (ref 2.5–4.6)

## 2024-04-23 LAB — FOLATE: Folate: 15.1 ng/mL (ref >5.9)

## 2024-04-23 LAB — MAGNESIUM: Magnesium: 2.4 mg/dL (ref 1.7–2.4)

## 2024-04-23 MED ORDER — DEXAMETHASONE 4 MG PO TABS
ORAL_TABLET | ORAL | Status: DC
Start: 2024-04-27 — End: 2024-04-29

## 2024-04-23 MED ORDER — MORPHINE SULFATE 15 MG PO TABS: 15.0000 mg | ORAL_TABLET | ORAL | 0 refills | Status: DC | PRN

## 2024-04-23 MED ORDER — VITAMIN B-1 100 MG PO TABS: 100.0000 mg | ORAL_TABLET | Freq: Every day | ORAL | Status: DC

## 2024-04-23 MED ORDER — POLYETHYLENE GLYCOL 3350 17 G PO PACK: 17.0000 g | PACK | Freq: Two times a day (BID) | ORAL | Status: DC

## 2024-04-23 MED ORDER — MORPHINE SULFATE ER 60 MG PO TBCR: 60.0000 mg | EXTENDED_RELEASE_TABLET | Freq: Three times a day (TID) | ORAL | 0 refills | Status: DC

## 2024-04-23 MED ORDER — FOLIC ACID 1 MG PO TABS: 1.0000 mg | ORAL_TABLET | Freq: Every day | ORAL | Status: DC

## 2024-04-23 MED ORDER — METHOCARBAMOL 500 MG PO TABS: 500.0000 mg | ORAL_TABLET | Freq: Three times a day (TID) | ORAL | 0 refills | Status: DC | PRN

## 2024-04-23 NOTE — Progress Notes (Signed)
 Physical Therapy Treatment Patient Details Name: David Townsend MRN: [id number] DOB: 11/06/1960 Today's Date: 04/23/2024   History of Present Illness Pt is a 63 y.o. male presenting to [facility] ED for cancer-related pain and  multiple falls. Recently in  for same , DC 11/7. PMH significant for prostate cancer with spinal metastases, rib fx, anemia of chronic disease, GERD, HTN, HLD, cancer-related pain, tobacco use disorder and alcohol use disorder. Imaging shows progression of diffuse bone metastasis to skull, ribs, spine, pelvis, humerus, and femurs.    PT Comments  Pt appears to lack insight to deficits, requesting therapist and radiation hurry up because he is going home today. Then pt later stating how am I going to do this at home. Pt needing pushed with BUE to scoot from long sitting out to bedside, supv for safety, using bedrails and elevated HOB to assist. Pt powers to stand with min A, slow to rise, cued for breath work, yells out in pain. In standing pt denies pain, ambulates 18 ft with RW, slow cadence, minimal foot clearance to shuffling gait pattern, forward flexed trunk, assist to complete turn with RW, denies pain with ambulation but also requests to go back to bed due to pain. Pt then requests [facility] for toileting, therapist completing pericare with pt holding to RW in standing. Pt with conflicting comments reporting he is ready to go home and how am I going to go this at home multiple times during session. Offered listening ear and encouragement with mobility. Pt requiring mod A to lift BLE back into bed, self directed but agreeable to log roll per therapist's continuous request. Educated pt on assistance required with mobility and continued recommendation of 24/7 assistance at d/c, questionable carryover.   If plan is discharge home, recommend the following: A lot of help with bathing/dressing/bathroom;Assist for transportation;Help with stairs or ramp for entrance;A lot of help with  walking and/or transfers   Can travel by private vehicle     No  Equipment Recommendations  None recommended by PT    Recommendations for Other Services       Precautions / Restrictions Precautions Precautions: Fall Recall of Precautions/Restrictions: Impaired Required Braces or Orthoses: Other Brace Other Brace: TLSO was prescribed last admission - pt declines ordering new one and will call someone to bring his from home Restrictions Weight Bearing Restrictions Per Provider Order: No     Mobility  Bed Mobility Overal bed mobility: Needs Assistance Bed Mobility: Rolling, Sit to Sidelying, Supine to Sit Rolling: Supervision   Supine to sit: Supervision, HOB elevated   Sit to sidelying: Mod assist, Used rails General bed mobility comments: pt in long sitting, pushes through BUE to scoot out to EOB; with return to supine, educated pt on log roll, needing mod A to lift BLE back into bed, constant cues on log roll technique, limited by pain    Transfers Overall transfer level: Needs assistance Equipment used: Rolling walker (2 wheels) Transfers: Sit to/from Stand, Bed to chair/wheelchair/BSC Sit to Stand: Min assist   Step pivot transfers: Min assist       General transfer comment: cues for hand placement and sequencing, decreased initiation with step pivot but unsure if pain or cog limiting, min A to power up and for controlled return to sitting    Ambulation/Gait Ambulation/Gait assistance: Min assist Gait Distance (Feet): 18 Feet Assistive device: Rolling walker (2 wheels) Gait Pattern/deviations: Step-to pattern, Decreased stride length, Trunk flexed, Narrow base of support Gait velocity: decreased  General Gait Details: slow, short steps wtih minimal bil foot clearance, trunk somewhat forward flexed with dependence on RW, narrow BOS, pain varies during gait with pt reporting 0 at times and 10/10 at times, pain appears to build with pt requesting to  sit   Stairs             Wheelchair Mobility     Tilt Bed    Modified Rankin (Stroke Patients Only)       Balance Overall balance assessment: Needs assistance Sitting-balance support: Feet supported Sitting balance-Leahy Scale: Fair Sitting balance - Comments: not challenged   Standing balance support: Bilateral upper extremity supported, During functional activity, Reliant on assistive device for balance Standing balance-Leahy Scale: Poor Standing balance comment: reliant on RW                            Communication Communication Communication: No apparent difficulties  Cognition Arousal: Alert Behavior During Therapy: Restless   PT - Cognitive impairments: No family/caregiver present to determine baseline                       PT - Cognition Comments: pt lacks insight to deficits, reports I am going home and then how am I going to do this at home multiple times during session, needing increasead time and cues for commands, low frustration tolerance with word finding difficulty Following commands: Impaired Following commands impaired: Follows one step commands with increased time    Cueing Cueing Techniques: Verbal cues, Gestural cues, Tactile cues, Visual cues  Exercises      General Comments        Pertinent Vitals/Pain Pain Assessment Pain Assessment: 0-10 Pain Score: 10-Worst pain ever Pain Location: back Pain Descriptors / Indicators:  (searing) Pain Intervention(s): Limited activity within patient's tolerance, Monitored during session, Premedicated before session, Repositioned, Relaxation    Home Living                          Prior Function            PT Goals (current goals can now be found in the care plan section) Progress towards PT goals: Progressing toward goals    Frequency    Min 2X/week      PT Plan      Co-evaluation              AM-PAC PT 6 Clicks Mobility   Outcome  Measure  Help needed turning from your back to your side while in a flat bed without using bedrails?: A Little Help needed moving from lying on your back to sitting on the side of a flat bed without using bedrails?: A Little Help needed moving to and from a bed to a chair (including a wheelchair)?: A Little Help needed standing up from a chair using your arms (e.g., wheelchair or bedside chair)?: A Little Help needed to walk in hospital room?: A Lot Help needed climbing 3-5 steps with a railing? : Total 6 Click Score: 15    End of Session Equipment Utilized During Treatment: Gait belt Activity Tolerance: Patient tolerated treatment well;Patient limited by pain Patient left: in bed;with call bell/phone within reach;with bed alarm set Nurse Communication: Mobility status;Patient requests pain meds PT Visit Diagnosis: Unsteadiness on feet (R26.81);Pain     Time: 8954-8878 PT Time Calculation (min) (ACUTE ONLY): 36 min  Charges:    $Gait Training:  8-22 mins $Therapeutic Activity: 8-22 mins PT General Charges $$ ACUTE PT VISIT: 1 Visit                     Tori Leinani Lisbon PT, DPT 04/23/24, 1:10 PM

## 2024-04-23 NOTE — Progress Notes (Signed)
 Daily Progress Note   Patient Name: David Townsend       Date: 04/23/2024 DOB: [birth year]  Age: 63 y.o. MRN#: [id number] Attending Physician: Caleen Burgess BROCKS, MD Primary Care Physician: Jaycee Greig PARAS, NP Admit Date: 04/13/2024 Length of Stay: 10 days  Reason for Consultation/Follow-up: Establishing goals of care  Subjective:   CC: Patient feels pain regimen working well for him. Following up regarding complex medical decision making.   Subjective:  Reviewed EMR including recent documentation from hospitalist and TOC.  Patient has still not had any bed offers for rehab.  At time of EMR review on past 24 hours patient has received as needed IV Dilaudid  1 mg x 3 doses and as needed MS IR 15 mg x 1 dose.  Patient continues to receive MS Contin  60 mg every 8 hours scheduled during the day.  Last BM noted in EMR 09/13/2023. Reviewed recent CMP noting GFR estimated over 60 to assist with managing medications.  Albumin also noted to be 4.2.  Presented to bedside to see patient.  Patient laying comfortably in bed.  Patient to receive his last radiation therapy today.  Inquired about pain regimen.  Patient does feel the as needed MS IR works well.  He has really needed the IV for breakthrough pain particular related to radiation therapy and laying flat on the bed.  Patient does not feel that he needs his MS IR dose adjusted.  Patient feels that he can maintain on currently ordered oral regimen for rehab.  Acknowledged and noted would continue with current doses.  Patient looking forward to today being his last radiation.  Patient has continued to express wishes to regain strength and work with PT/OT.  All questions answered at that time.  Noted pal medicine to continue following patient's medical journey.  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  Patient had been recently hospitalized and discharged on 04/10/2024 for uncontrolled cancer pain.  Since admission, patient has received management for acute metabolic encephalopathy, cancer related pain, community-acquired versus atypical pneumonia, and transaminitis.,  Medicine team consulted to assist with complex medical decision making.  Recommendations/Plan: # Complex medical decision making/goals of care:  - Have engaged with multiple conversations with patient.  Oncologist was very clear that patient would have to regain strength and abstain from alcohol to be a candidate for further cancer directed therapies which would be palliative in nature, not curative.  Patient at this time continues to elect to pursue going to rehab to regain strength for follow-up with oncology in the outpatient setting.  Have already explained patient can elect for hospice involvement should he decide he wants to focus on comfort or is not a candidate for cancer directed therapies.  Palliative medicine team continuing to follow along with patient's medical journey.  - Patient does have ACP documentation on file naming friend, [name], as HCPOA and primary alternate HCPOA, [name].    -  Code Status: Full Code   - Have already CODE STATUS extensively.  Patient  currently wishing to remain full code and did not wish to discuss CODE STATUS further at this time.  # Symptom management: Patient is receiving these palliative interventions for symptom management with an intent to improve quality of life.   - Pain, in the setting of metastatic prostate cancer   - Continue MS Contin  60 mg every 8 hours scheduled   - Continue MS IR 15 mg every 4 hours as needed   - Continue IV Dilaudid  1 mg every 4 hours as needed breakthrough pain, particularly for premedication for radiation.    - Constipation, in setting of receiving opioids   Last BM in EMR noted 04/23/2024.    - Continue senna 2 tab twice daily    - Continue MiraLAX 17 g twice daily  # Discharge Planning: Skilled Nursing Facility for rehab with Palliative care service follow-up -[facility] assisting with referrals.  No bed offers at this time.  Thank you for allowing the palliative care team to participate in the care David Townsend.  Tinnie Radar, DO Palliative Care Provider PMT # [phone]  If patient remains symptomatic despite maximum doses, please call PMT at [phone] between 0700 and 1900. Outside of these hours, please call attending, as PMT does not have night coverage.

## 2024-04-23 NOTE — Progress Notes (Signed)
 PROGRESS NOTE    David Townsend  [name] DOB: 06-21-1960 DOA: 04/13/2024 PCP: Jaycee Greig PARAS, NP    Brief Narrative:   63 y.o. M with castration resistant metastatic prostate cancer, HTN, and alcohol use who presented with falls.   Recently hospitalized for uncontrolled cancer pain, medications titrated and he was discharged home.  However, at home, he was too weak to care for himself, fell multiples times and so was btought back to the hospital.  Here, he developed severe delirium on hospital day 2, which has improved but he remains extremely weak and with some short term memory issues/processing deficits.    During the hospitalization he was evaluated by radiation oncology and started on radiation which he completed on 11/20.  They also recommended Decadron  taper which has been prescribed.  Encephalopathy was slowly improving.  Multiple attempts for MRI of the brain were made but patient was unable to tolerate this. During the hospitalization he was also treated for with 5 days of Rocephin  and azithromycin  for presumed community-acquired pneumonia. Physical therapy team is recommending SNF therefore TOC has been consulted.  Assessment & Plan:  Acute metabolic encephalopathy  Etiology has been somewhat unclear.  Patient has not been able to tolerate MRI, multiple attempts have been made by the previous provider.  There is concerns that that could be cancer related delirium therefore on Decadron  weaning protocol as mentioned below.  Repeat ammonia, B12, TSH, folate - Standard delirium precautions.   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  Overall patient does have poor prognosis.  Patient seen by  oncology who is recommending to continue to finish palliative radiation.  Depending on progression, may offer further treatment otherwise continue to engage palliative care and consider hospice.   Last day of Radiation 11/20  Community-acquired versus atypical pneumonia Treated with 5 days Rocephin  and azithromycin , now resolved.   HTN  Norvasc , Avapro , Lasix .  IV as needed   HLD - Lipitor held due to transaminitis, worsening prognosis    Transaminitis  Resolved   DVT prophylaxis: Lovenox     Code Status: Full Code Family Communication:   Status is: Inpatient Remains inpatient appropriate because: Last day of radiation today.  Ongoing SNF placement   PT Follow up Recs: Skilled Nursing-Short Term Rehab (<3 Hours/Day)04/21/2024 1008  Subjective:  Last day of radiation today No new complaints  Examination:  General exam: Appears calm and comfortable  Respiratory system: Clear to auscultation. Respiratory effort normal. Cardiovascular system: S1 & S2 heard, RRR. No JVD, murmurs, rubs, gallops or clicks. No pedal edema. Gastrointestinal system: Abdomen is nondistended, soft and nontender. No organomegaly or masses felt. Normal bowel sounds heard. Central nervous system: Alert and oriented to name and place. No focal neurological deficits. Extremities: Symmetric 5 x 5 power. Skin: No rashes, lesions or ulcers Psychiatry: Judgement and insight appear poor                Diet Orders (From admission, onward)     Start     Ordered   04/13/24 1418  Diet Heart Room service appropriate? Yes; Fluid consistency: Thin  Diet effective now       Question Answer Comment  Room service  appropriate? Yes   Fluid consistency: Thin      04/13/24 1418            Objective: Vitals:   04/22/24 1447 04/23/24 0207 04/23/24 0601 04/23/24 1034  BP: 124/81 127/77 (!) 142/72 130/81  Pulse: 97 91 99 100  Resp: 18 18 18    Temp: 97.9 F (36.6 C) 98.5 F (36.9 C) 97.9 F (36.6  C)   TempSrc: Oral Oral Oral   SpO2: 100% 91% 97%   Weight:      Height:        Intake/Output Summary (Last 24 hours) at 04/23/2024 1139 Last data filed at 04/23/2024 1047 Gross per 24 hour  Intake 120 ml  Output 400 ml  Net -280 ml   Filed Weights   04/15/24 1739  Weight: 67.9 kg    Scheduled Meds:  amLODipine   10 mg Oral Daily   apalutamide   240 mg Oral Daily   Chlorhexidine  Gluconate Cloth  6 each Topical Daily   dexamethasone   4 mg Oral Q12H   enoxaparin  (LOVENOX ) injection  40 mg Subcutaneous Q24H   folic acid   1 mg Oral Daily   furosemide   20 mg Oral Daily   irbesartan   75 mg Oral Daily   morphine   60 mg Oral Q8H   multivitamin with minerals  1 tablet Oral Daily   nicotine   21 mg Transdermal Daily   polyethylene glycol  17 g Oral BID   senna-docusate  2 tablet Oral BID   thiamine   100 mg Oral Daily   Or   thiamine   100 mg Intravenous Daily   Continuous Infusions:  Nutritional status     Body mass index is 22.09 kg/m.  Data Reviewed:   CBC: Recent Labs  Lab 04/16/24 1912 04/18/24 0604 04/19/24 0632 04/22/24 0604 04/23/24 0014  WBC 10.1 9.2 10.1 14.9* 15.2*  HGB 10.3* 10.9* 11.4* 11.5* 11.8*  HCT 30.3* 31.8* 33.7* 34.9* 34.9*  MCV 111.8* 111.6* 112.7* 115.2* 115.2*  PLT 248 246 243 280 296   Basic Metabolic Panel: Recent Labs  Lab 04/17/24 0709 04/18/24 0604 04/19/24 0632 04/22/24 0604 04/23/24 0014  NA 136 137 137 134* 134*  K 3.7 4.2 4.3 4.9 5.1  CL 97* 99 98 96* 96*  CO2 26 24 27 23 24   GLUCOSE 87 107* 111* 120* 103*  BUN 11 13 19  26* 31*  CREATININE 0.61 0.63 0.81 0.96 1.08  CALCIUM  9.8 10.0 10.1 10.7* 10.7*  MG  --   --   --   --  2.4  PHOS  --   --   --   --  3.8   GFR: Estimated Creatinine Clearance: 67.2 mL/min (by C-G formula based on SCr of 1.08 mg/dL). Liver Function Tests: Recent Labs  Lab 04/19/24 9367 04/22/24 0604 04/23/24 0014  AST 24 27 33  ALT 35 28 31  ALKPHOS 167* 159* 156*  BILITOT 0.4 0.5 0.4  PROT  7.1 7.8 7.7  ALBUMIN 4.0 4.4 4.2   No results for input(s): LIPASE, AMYLASE in the last 168 hours. Recent Labs  Lab 04/22/24 0839  AMMONIA <13   Coagulation Profile: No results for input(s): INR, PROTIME in the last 168 hours. Cardiac Enzymes: No results for input(s): CKTOTAL, CKMB, CKMBINDEX, TROPONINI in the last 168 hours. BNP (last 3 results) No results for input(s): PROBNP in the last 8760 hours. HbA1C: No results for input(s): HGBA1C in the last 72 hours. CBG: No results for input(s): GLUCAP in the last  168 hours. Lipid Profile: No results for input(s): CHOL, HDL, LDLCALC, TRIG, CHOLHDL, LDLDIRECT in the last 72 hours. Thyroid  Function Tests: Recent Labs    04/22/24 0839  TSH 1.980   Anemia Panel: Recent Labs    04/22/24 0839 04/23/24 0014  VITAMINB12 1,406*  --   FOLATE  --  15.1   Sepsis Labs: No results for input(s): PROCALCITON, LATICACIDVEN in the last 168 hours.  Recent Results (from the past 240 hours)  Urine Culture     Status: Abnormal   Collection Time: 04/13/24  1:18 PM   Specimen: Urine, Clean Catch  Result Value Ref Range Status   Specimen Description   Final    URINE, CLEAN CATCH Performed at [facility], [address] [place], [place] [address]    Special Requests   Final    Normal Performed at [facility], [address], [place], [place] [address]    Culture MULTIPLE SPECIES PRESENT, SUGGEST RECOLLECTION (A)  Final   Report Status 04/14/2024 FINAL  Final  Blood culture (routine x 2)     Status: None   Collection Time: 04/13/24  1:49 PM   Specimen: BLOOD RIGHT WRIST  Result Value Ref Range Status   Specimen Description   Final    BLOOD RIGHT WRIST Performed at [facility], [address], [place], [place] [address]    Special Requests   Final    BOTTLES DRAWN AEROBIC AND ANAEROBIC Blood Culture adequate volume Performed at [facility], [address] [place], [place] [address]    Culture   Final    NO GROWTH 5 DAYS Performed at [facility], [address] [place], [place] [address]    Report Status 04/18/2024 FINAL  Final  Blood culture (routine x 2)     Status: None   Collection Time: 04/13/24  1:49 PM   Specimen: BLOOD RIGHT FOREARM  Result Value Ref Range Status   Specimen Description   Final    BLOOD RIGHT FOREARM Performed at [facility], [address], [place], [place] [address]    Special Requests   Final    BOTTLES DRAWN AEROBIC AND ANAEROBIC Blood Culture adequate volume Performed at [facility], [address], [place], [place] [address]    Culture   Final    NO GROWTH 5 DAYS Performed at [facility], [address] [place], [place] [address]    Report Status 04/18/2024 FINAL  Final  Respiratory (~20 pathogens) panel by PCR     Status: None   Collection Time: 04/13/24  2:58 PM   Specimen: Nasopharyngeal Swab; Respiratory  Result Value Ref Range Status   Adenovirus NOT DETECTED NOT DETECTED Final   Coronavirus 229E NOT DETECTED NOT DETECTED Final    Comment: (NOTE) The Coronavirus on the Respiratory Panel, DOES NOT test for the novel  Coronavirus (2019 nCoV)    Coronavirus HKU1 NOT DETECTED NOT DETECTED Final   Coronavirus NL63 NOT DETECTED NOT DETECTED Final   Coronavirus OC43 NOT DETECTED NOT DETECTED Final   Metapneumovirus NOT DETECTED NOT DETECTED Final   Rhinovirus / Enterovirus NOT DETECTED NOT DETECTED Final   Influenza A NOT DETECTED NOT DETECTED Final   Influenza B NOT DETECTED NOT DETECTED Final   Parainfluenza Virus 1 NOT DETECTED NOT DETECTED Final   Parainfluenza Virus 2 NOT DETECTED NOT DETECTED Final   Parainfluenza Virus 3 NOT DETECTED NOT DETECTED Final  Parainfluenza Virus 4 NOT DETECTED NOT DETECTED Final   Respiratory Syncytial Virus NOT DETECTED NOT DETECTED Final   Bordetella pertussis NOT DETECTED NOT DETECTED  Final   Bordetella Parapertussis NOT DETECTED NOT DETECTED Final   Chlamydophila pneumoniae NOT DETECTED NOT DETECTED Final   Mycoplasma pneumoniae NOT DETECTED NOT DETECTED Final    Comment: Performed at [facility], [address] [place], [place] [address]         Radiology Studies: No results found.         LOS: 10 days   Time spent= 35 mins    Burgess JAYSON Dare, MD [facility]  If 7PM-7AM, please contact night-coverage  04/23/2024, 11:39 AM

## 2024-04-23 NOTE — Plan of Care (Signed)

## 2024-04-23 NOTE — TOC Progression Note (Signed)
 Transition of Care [facility]) - Progression Note    Patient Details  Name: David Townsend MRN: [id number] Date of Birth: [birth year]  Transition of Care [facility] CM/SW Contact  Toy LITTIE Agar, RN Phone [phone]  04/23/2024, 10:53 AM  Clinical Narrative:    Bed search has been extended and still no bed offers.    Expected Discharge Plan: Skilled Nursing Facility Barriers to Discharge: Continued Medical Work up               Expected Discharge Plan and Services In-house Referral: NA Discharge Planning Services: CM Consult Post Acute Care Choice: NA Living arrangements for the past 2 months: Single Family Home                 DME Arranged: N/A DME Agency: NA       HH Arranged: NA HH Agency: NA         Social Drivers of Health (SDOH) Interventions SDOH Screenings   Food Insecurity: No Food Insecurity (04/13/2024)  Housing: Low Risk  (04/13/2024)  Transportation Needs: No Transportation Needs (04/13/2024)  Utilities: Not At Risk (04/13/2024)  Alcohol Screen: Medium Risk (12/04/2023)  Depression (PHQ2-9): Low Risk  (12/04/2023)  Tobacco Use: High Risk (04/13/2024)    Readmission Risk Interventions    04/16/2024    1:08 PM  Readmission Risk Prevention Plan  Transportation Screening Complete  Medication Review (RN Care Manager) Referral to Pharmacy  PCP or Specialist appointment within 3-5 days of discharge Complete  HRI or Home Care Consult Complete  SW Recovery Care/Counseling Consult Complete  Palliative Care Screening Not Applicable  Skilled Nursing Facility Complete

## 2024-04-23 NOTE — Plan of Care (Signed)
  Problem: Clinical Measurements: Goal: Ability to maintain clinical measurements within normal limits will improve Outcome: Progressing Goal: Will remain free from infection Outcome: Progressing Goal: Diagnostic test results will improve Outcome: Progressing   Problem: Pain Managment: Goal: General experience of comfort will improve and/or be controlled Outcome: Progressing   Problem: Safety: Goal: Ability to remain free from injury will improve Outcome: Progressing

## 2024-04-24 ENCOUNTER — Ambulatory Visit

## 2024-04-24 DIAGNOSIS — R296 Repeated falls: Secondary | ICD-10-CM | POA: Diagnosis not present

## 2024-04-24 LAB — COMPREHENSIVE METABOLIC PANEL WITH GFR
ALT: 30 U/L (ref 0–44)
AST: 24 U/L (ref 15–41)
Albumin: 4.1 g/dL (ref 3.5–5.0)
Alkaline Phosphatase: 140 U/L — ABNORMAL HIGH (ref 38–126)
Anion gap: 12 (ref 5–15)
BUN: 25 mg/dL — ABNORMAL HIGH (ref 8–23)
CO2: 25 mmol/L (ref 22–32)
Calcium: 10.2 mg/dL (ref 8.9–10.3)
Chloride: 96 mmol/L — ABNORMAL LOW (ref 98–111)
Creatinine, Ser: 0.86 mg/dL (ref 0.61–1.24)
GFR, Estimated: >60 mL/min (ref >60)
Glucose, Bld: 122 mg/dL — ABNORMAL HIGH (ref 70–99)
Potassium: 4.6 mmol/L (ref 3.5–5.1)
Sodium: 133 mmol/L — ABNORMAL LOW (ref 135–145)
Total Bilirubin: 0.3 mg/dL (ref 0.0–1.2)
Total Protein: 6.9 g/dL (ref 6.5–8.1)

## 2024-04-24 NOTE — Progress Notes (Signed)
 Occupational Therapy Treatment Patient Details Name: David Townsend MRN: [id number] DOB: [birth year] Today's Date: 04/24/2024   History of present illness Pt is a 63 y.o. male presenting to [facility] ED for cancer-related pain and  multiple falls. Recently in for same, DC 11/7. PMH significant for prostate cancer with spinal metastases, rib fx, anemia of chronic disease, GERD, HTN, HLD, cancer-related pain, tobacco use disorder and alcohol use disorder. Imaging shows progression of diffuse bone metastasis to skull, ribs, spine, pelvis, humerus, and femurs.   OT comments  Pt reports no pain throughout session. Pt continues to be limited by poor awareness into situation, lacks insight into deficits and demonstrates poor STM throughout. Pt educated on use of TLSO and use to assist with comfort during mobility efforts, after OT provides education and rationale, pt is agreeable to trial TLSO. With OT in room, pt calls friend and leaves a voicemail requesting brace to be brought to hospital. Pt recalls 0 back precautions start and end of session. MAX A for LB dressing from seated position (did not stand) using reacher with pt requiring frequent cues to redirect as he is easily externally distracted and forgetful. Pt unsafe to discharge home alone due to current impairments in balance, cognition and strength. OT will follow acutely, discharge recommendation appropriate. Patient will benefit from continued inpatient follow up therapy, <3 hours/day       If plan is discharge home, recommend the following:  Two people to help with walking and/or transfers;A lot of help with bathing/dressing/bathroom;Assistance with cooking/housework;Assistance with feeding;Direct supervision/assist for medications management;Direct supervision/assist for financial management;Assist for transportation;Help with stairs or ramp for entrance   Equipment Recommendations  Other (comment)       Precautions / Restrictions  Precautions Precautions: Back;Fall Recall of Precautions/Restrictions: Impaired Precaution/Restrictions Comments: severe back pain, no recall of spinal precautions. pt previously refusing to wear TLSO. Required Braces or Orthoses: Other Brace Other Brace: TLSO was prescribed last admission - pt declines ordering new one and will call someone to bring his from home. With OT present, pt left VM for friend to bring brace on 11/21. Pt agreeable to wear TLSO after OT provides education. Restrictions Weight Bearing Restrictions Per Provider Order: No       Mobility Bed Mobility Overal bed mobility: Needs Assistance             General bed mobility comments: NT, pt recieved and left in recliner    Transfers Overall transfer level: Needs assistance                 General transfer comment: NT. pt now agreeable to trial TLSO.     Balance Overall balance assessment: Needs assistance Sitting-balance support: Feet supported Sitting balance-Leahy Scale: Fair Sitting balance - Comments: not challenged                                   ADL either performed or assessed with clinical judgement   ADL Overall ADL's : Needs assistance/impaired                     Lower Body Dressing: Sitting/lateral leans;Maximal assistance;With adaptive equipment;Cueing for safety;Cueing for sequencing;Cueing for back precautions;Adhering to back precautions Lower Body Dressing Details (indicate cue type and reason): edu on use of reacher with spinal precautions. pt with impaired cognition and poor reasoning. anticipate need for continued reinforcement. pt requires MAX A to problem-solve use of reacher, physically  and with cues. pt able to raise BLE to place inside underwear but has low frusteration tolerance and is easily irritated. maxA to thread over feet and pull over knees. did not stand due to lack of TLSO in room.                    Extremity/Trunk Assessment  Upper Extremity Assessment RUE Coordination: decreased fine motor;decreased gross motor LUE Coordination: decreased fine motor;decreased gross motor                     Communication Communication Communication: No apparent difficulties   Cognition Arousal: Alert Behavior During Therapy: Restless Cognition: Cognition impaired   Orientation impairments: Time, Situation Awareness: Intellectual awareness impaired, Online awareness impaired Memory impairment (select all impairments): Working civil service fast streamer, Conservation officer, historic buildings, Short-term memory Attention impairment (select first level of impairment): Sustained attention Executive functioning impairment (select all impairments): Initiation, Sequencing, Reasoning, Problem solving OT - Cognition Comments: pt not aware of the extent of his mets, lacks insight into deficits and situation with poor judgement                 Following commands: Impaired Following commands impaired: Follows one step commands with increased time      Cueing   Cueing Techniques: Verbal cues, Gestural cues, Tactile cues, Visual cues             Pertinent Vitals/ Pain       Pain Assessment Pain Assessment: Faces Faces Pain Scale: No hurt   Frequency  Min 2X/week        Progress Toward Goals  OT Goals(current goals can now be found in the care plan section)  Progress towards OT goals: Progressing toward goals  Acute Rehab OT Goals OT Goal Formulation: Patient unable to participate in goal setting Time For Goal Achievement: 04/30/24 Potential to Achieve Goals: Fair ADL Goals Pt Will Perform Grooming: standing;with min assist Pt Will Perform Upper Body Bathing: with min assist;sitting Pt Will Perform Upper Body Dressing: with supervision;sitting Pt Will Transfer to Toilet: with min assist;ambulating Pt Will Perform Toileting - Clothing Manipulation and hygiene: with supervision;sit to/from stand Additional ADL Goal #1: Pt will  adhere to back precautions for comfort during mobility and ADLs.  Plan         AM-PAC OT 6 Clicks Daily Activity     Outcome Measure   Help from another person eating meals?: A Little Help from another person taking care of personal grooming?: A Little Help from another person toileting, which includes using toliet, bedpan, or urinal?: A Lot Help from another person bathing (including washing, rinsing, drying)?: A Lot Help from another person to put on and taking off regular upper body clothing?: A Lot Help from another person to put on and taking off regular lower body clothing?: A Lot 6 Click Score: 14    End of Session    OT Visit Diagnosis: Pain;Unsteadiness on feet (R26.81);Muscle weakness (generalized) (M62.81);Other abnormalities of gait and mobility (R26.89);Other symptoms and signs involving cognitive function   Activity Tolerance Patient tolerated treatment well;No increased pain   Patient Left in chair;with call bell/phone within reach;with chair alarm set;with nursing/sitter in room   Nurse Communication Mobility status;Other (comment) (pt now agreeable to wear TLSO.)        Time: 8778-8761 OT Time Calculation (min): 17 min  Charges: OT General Charges $OT Visit: 1 Visit OT Treatments $Self Care/Home Management : 8-22 mins  Alira Fretwell L. Lilie Vezina, OTR/L  04/24/24, 2:18 PM

## 2024-04-24 NOTE — Plan of Care (Signed)
   Problem: Clinical Measurements: Goal: Will remain free from infection Outcome: Progressing Goal: Cardiovascular complication will be avoided Outcome: Progressing   Problem: Nutrition: Goal: Adequate nutrition will be maintained Outcome: Progressing   Problem: Elimination: Goal: Will not experience complications related to urinary retention Outcome: Progressing

## 2024-04-24 NOTE — Progress Notes (Signed)
 PROGRESS NOTE    David Townsend  [name] DOB: 02/10/1961 DOA: 04/13/2024 PCP: Jaycee Greig PARAS, NP    Brief Narrative:   63 y.o. M with castration resistant metastatic prostate cancer, HTN, and alcohol use who presented with falls.   Recently hospitalized for uncontrolled cancer pain, medications titrated and he was discharged home.  However, at home, he was too weak to care for himself, fell multiples times and so was btought back to the hospital.  Here, he developed severe delirium on hospital day 2, which has improved but he remains extremely weak and with some short term memory issues/processing deficits.    During the hospitalization he was evaluated by radiation oncology and started on radiation which he completed on 11/20.  They also recommended Decadron  taper which has been prescribed.  Encephalopathy was slowly improving.  Multiple attempts for MRI of the brain were made but patient was unable to tolerate this. During the hospitalization he was also treated for with 5 days of Rocephin  and azithromycin  for presumed community-acquired pneumonia. Physical therapy team is recommending SNF therefore TOC has been consulted.  Assessment & Plan:  Acute metabolic encephalopathy  Etiology has been somewhat unclear.  Patient has not been able to tolerate MRI, multiple attempts have been made by the previous provider.  There is concerns that that could be cancer related delirium therefore on Decadron  weaning protocol as mentioned below.  Repeat ammonia, B12, TSH, folate - Standard delirium precautions.   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  Overall patient does have poor prognosis.  Patient seen by  oncology who is recommending to continue to finish palliative radiation.  Depending on progression, may offer further treatment otherwise continue to engage palliative care and consider hospice.   Last day of Radiation 11/20  Community-acquired versus atypical pneumonia Treated with 5 days Rocephin  and azithromycin , now resolved.   HTN  Norvasc , Avapro , Lasix .  IV as needed   HLD - Lipitor held due to transaminitis, worsening prognosis    Transaminitis  Resolved   DVT prophylaxis: Lovenox     Code Status: Full Code Family Communication:   Status is: Inpatient Remains inpatient appropriate because: SNF placement  PT Follow up Recs: Skilled Nursing-Short Term Rehab (<3 Hours/Day)04/21/2024 1008  Subjective:  Seen at bedside no complaints.  Awaiting SNF placement  Examination:  General exam: Appears calm and comfortable  Respiratory system: Clear to auscultation. Respiratory effort normal. Cardiovascular system: S1 & S2 heard, RRR. No JVD, murmurs, rubs, gallops or clicks. No pedal edema. Gastrointestinal system: Abdomen is nondistended, soft and nontender. No organomegaly or masses felt. Normal bowel sounds heard. Central nervous system: Alert and oriented to name and place. No focal neurological deficits. Extremities: Symmetric 5 x 5 power. Skin: No rashes, lesions or ulcers Psychiatry: Judgement and insight appear poor                Diet Orders (From admission, onward)     Start     Ordered   04/13/24 1418  Diet Heart Room service appropriate? Yes; Fluid consistency: Thin  Diet effective now       Question Answer Comment  Room service appropriate? Yes   Fluid consistency: Thin  04/13/24 1418            Objective: Vitals:   04/23/24 0601 04/23/24 1034 04/23/24 1439 04/24/24 0414  BP: (!) 142/72 130/81 110/74 134/73  Pulse: 99 100 85 96  Resp: 18  18 18   Temp: 97.9 F (36.6 C)  97.7 F (36.5 C) 98.1 F (36.7 C)  TempSrc: Oral  Oral Oral   SpO2: 97%  97% 99%  Weight:      Height:        Intake/Output Summary (Last 24 hours) at 04/24/2024 1230 Last data filed at 04/23/2024 1551 Gross per 24 hour  Intake --  Output 100 ml  Net -100 ml   Filed Weights   04/15/24 1739  Weight: 67.9 kg    Scheduled Meds:  amLODipine   10 mg Oral Daily   apalutamide   240 mg Oral Daily   Chlorhexidine  Gluconate Cloth  6 each Topical Daily   dexamethasone   4 mg Oral Q12H   enoxaparin  (LOVENOX ) injection  40 mg Subcutaneous Q24H   folic acid   1 mg Oral Daily   furosemide   20 mg Oral Daily   irbesartan   75 mg Oral Daily   morphine   60 mg Oral Q8H   multivitamin with minerals  1 tablet Oral Daily   nicotine   21 mg Transdermal Daily   polyethylene glycol  17 g Oral BID   senna-docusate  2 tablet Oral BID   thiamine   100 mg Oral Daily   Or   thiamine   100 mg Intravenous Daily   Continuous Infusions:  Nutritional status     Body mass index is 22.09 kg/m.  Data Reviewed:   CBC: Recent Labs  Lab 04/18/24 0604 04/19/24 0632 04/22/24 0604 04/23/24 0014  WBC 9.2 10.1 14.9* 15.2*  HGB 10.9* 11.4* 11.5* 11.8*  HCT 31.8* 33.7* 34.9* 34.9*  MCV 111.6* 112.7* 115.2* 115.2*  PLT 246 243 280 296   Basic Metabolic Panel: Recent Labs  Lab 04/18/24 0604 04/19/24 0632 04/22/24 0604 04/23/24 0014 04/24/24 0600  NA 137 137 134* 134* 133*  K 4.2 4.3 4.9 5.1 4.6  CL 99 98 96* 96* 96*  CO2 24 27 23 24 25   GLUCOSE 107* 111* 120* 103* 122*  BUN 13 19 26* 31* 25*  CREATININE 0.63 0.81 0.96 1.08 0.86  CALCIUM  10.0 10.1 10.7* 10.7* 10.2  MG  --   --   --  2.4  --   PHOS  --   --   --  3.8  --    GFR: Estimated Creatinine Clearance: 84.4 mL/min (by C-G formula based on SCr of 0.86 mg/dL). Liver Function Tests: Recent Labs  Lab 04/19/24 0632 04/22/24 0604 04/23/24 0014 04/24/24 0600  AST 24 27 33 24  ALT 35 28 31 30   ALKPHOS 167* 159* 156* 140*  BILITOT 0.4 0.5 0.4 0.3  PROT 7.1 7.8 7.7 6.9  ALBUMIN 4.0 4.4 4.2 4.1    No results for input(s): LIPASE, AMYLASE in the last 168 hours. Recent Labs  Lab 04/22/24 0839  AMMONIA <13   Coagulation Profile: No results for input(s): INR, PROTIME in the last 168 hours. Cardiac Enzymes: No results for input(s): CKTOTAL, CKMB, CKMBINDEX, TROPONINI in the last 168 hours. BNP (last 3 results) No results for input(s): PROBNP in the last 8760 hours. HbA1C: No results for input(s): HGBA1C in the last 72 hours. CBG: No results for input(s): GLUCAP in the last 168 hours. Lipid Profile: No results for input(s): CHOL, HDL, LDLCALC, TRIG,  CHOLHDL, LDLDIRECT in the last 72 hours. Thyroid  Function Tests: Recent Labs    04/22/24 0839  TSH 1.980   Anemia Panel: Recent Labs    04/22/24 0839 04/23/24 0014  VITAMINB12 1,406*  --   FOLATE  --  15.1   Sepsis Labs: No results for input(s): PROCALCITON, LATICACIDVEN in the last 168 hours.  No results found for this or any previous visit (from the past 240 hours).       Radiology Studies: No results found.         LOS: 11 days   Time spent= 35 mins    Burgess JAYSON Dare, MD [facility]  If 7PM-7AM, please contact night-coverage  04/24/2024, 12:30 PM

## 2024-04-24 NOTE — TOC Progression Note (Signed)
 Transition of Care [facility]) - Progression Note    Patient Details  Name: David Townsend MRN: [id number] Date of Birth: Oct 25, 1960  Transition of [facility] CM/SW Contact  Toy LITTIE Agar, RN Phone Number:[phone]  04/24/2024, 9:51 AM  Clinical Narrative:    Cm at bedside to update patient on bed offers. Patient has been made aware that there are currently no bed offers. Patient states that he is from home alone and currently unable to walk on his own. CM discussed alternative plan if there is no bed offer. Patient states that he will check with his friends and girlfriend to see if they are able to stay with him to assist with his care. CM has resubmitted bed request to update info about patient completing radiation to see if this will make a difference in bed offers. CM to follow up.    Expected Discharge Plan: Skilled Nursing Facility Barriers to Discharge: Continued Medical Work up               Expected Discharge Plan and Services In-house Referral: NA Discharge Planning Services: CM Consult Post Acute Care Choice: NA Living arrangements for the past 2 months: Single Family Home                 DME Arranged: N/A DME Agency: NA       HH Arranged: NA HH Agency: NA         Social Drivers of Health (SDOH) Interventions SDOH Screenings   Food Insecurity: No Food Insecurity (04/13/2024)  Housing: Low Risk  (04/13/2024)  Transportation Needs: No Transportation Needs (04/13/2024)  Utilities: Not At Risk (04/13/2024)  Alcohol Screen: Medium Risk (12/04/2023)  Depression (PHQ2-9): Low Risk  (12/04/2023)  Tobacco Use: High Risk (04/13/2024)    Readmission Risk Interventions    04/16/2024    1:08 PM  Readmission Risk Prevention Plan  Transportation Screening Complete  Medication Review (RN Care Manager) Referral to Pharmacy  PCP or Specialist appointment within 3-5 days of discharge Complete  HRI or Home Care Consult Complete  SW Recovery Care/Counseling Consult  Complete  Palliative Care Screening Not Applicable  Skilled Nursing Facility Complete

## 2024-04-25 DIAGNOSIS — R296 Repeated falls: Secondary | ICD-10-CM | POA: Diagnosis not present

## 2024-04-25 LAB — COMPREHENSIVE METABOLIC PANEL WITH GFR
ALT: 26 U/L (ref 0–44)
AST: 20 U/L (ref 15–41)
Albumin: 4.3 g/dL (ref 3.5–5.0)
Alkaline Phosphatase: 140 U/L — ABNORMAL HIGH (ref 38–126)
Anion gap: 14 (ref 5–15)
BUN: 21 mg/dL (ref 8–23)
CO2: 23 mmol/L (ref 22–32)
Calcium: 10.3 mg/dL (ref 8.9–10.3)
Chloride: 95 mmol/L — ABNORMAL LOW (ref 98–111)
Creatinine, Ser: 0.72 mg/dL (ref 0.61–1.24)
GFR, Estimated: >60 mL/min (ref >60)
Glucose, Bld: 115 mg/dL — ABNORMAL HIGH (ref 70–99)
Potassium: 4 mmol/L (ref 3.5–5.1)
Sodium: 131 mmol/L — ABNORMAL LOW (ref 135–145)
Total Bilirubin: 0.4 mg/dL (ref 0.0–1.2)
Total Protein: 7.4 g/dL (ref 6.5–8.1)

## 2024-04-25 NOTE — Plan of Care (Signed)
   Problem: Clinical Measurements: Goal: Will remain free from infection Outcome: Progressing Goal: Respiratory complications will improve Outcome: Progressing   Problem: Activity: Goal: Risk for activity intolerance will decrease Outcome: Progressing   Problem: Nutrition: Goal: Adequate nutrition will be maintained Outcome: Progressing   Problem: Coping: Goal: Level of anxiety will decrease Outcome: Progressing

## 2024-04-25 NOTE — Plan of Care (Signed)
  Problem: Education: Goal: Knowledge of General Education information will improve Description: Including pain rating scale, medication(s)/side effects and non-pharmacologic comfort measures Outcome: Progressing   Problem: Clinical Measurements: Goal: Will remain free from infection Outcome: Progressing Goal: Respiratory complications will improve Outcome: Progressing Goal: Cardiovascular complication will be avoided Outcome: Progressing   Problem: Elimination: Goal: Will not experience complications related to urinary retention Outcome: Progressing   Problem: Pain Managment: Goal: General experience of comfort will improve and/or be controlled Outcome: Progressing   Problem: Safety: Goal: Ability to remain free from injury will improve Outcome: Progressing

## 2024-04-25 NOTE — Progress Notes (Signed)
 PROGRESS NOTE    David Townsend  [name] DOB: [birth year] DOA: 04/13/2024 PCP: Jaycee Greig PARAS, NP    Brief Narrative:   63 y.o. M with castration resistant metastatic prostate cancer, HTN, and alcohol use who presented with falls.   Recently hospitalized for uncontrolled cancer pain, medications titrated and he was discharged home.  However, at home, he was too weak to care for himself, fell multiples times and so was btought back to the hospital.  Here, he developed severe delirium on hospital day 2, which has improved but he remains extremely weak and with some short term memory issues/processing deficits.    During the hospitalization he was evaluated by radiation oncology and started on radiation which he completed on 11/20.  They also recommended Decadron  taper which has been prescribed.  Encephalopathy was slowly improving.  Multiple attempts for MRI of the brain were made but patient was unable to tolerate this. During the hospitalization he was also treated for with 5 days of Rocephin  and azithromycin  for presumed community-acquired pneumonia. Physical therapy team is recommending SNF therefore TOC has been consulted.  Assessment & Plan:  Acute metabolic encephalopathy  Etiology has been somewhat unclear.  Patient has not been able to tolerate MRI, multiple attempts have been made by the previous provider.  There is concerns that that could be cancer related delirium therefore on Decadron  weaning protocol as mentioned below.  Repeat ammonia, B12, TSH, folate - Standard delirium precautions.   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  Overall patient does have poor prognosis.  Patient seen by  oncology who is recommending to continue to finish palliative radiation.  Depending on progression, may offer further treatment otherwise continue to engage palliative care and consider hospice.   Last day of Radiation 11/20  Community-acquired versus atypical pneumonia Treated with 5 days Rocephin  and azithromycin , now resolved.   HTN  Norvasc , Avapro , Lasix .  IV as needed   HLD - Lipitor held due to transaminitis, worsening prognosis    Transaminitis  Resolved   DVT prophylaxis: Lovenox     Code Status: Full Code Family Communication:   Status is: Inpatient Remains inpatient appropriate because: SNF placement  PT Follow up Recs: Skilled Nursing-Short Term Rehab (<3 Hours/Day)04/21/2024 1008  Subjective: No complaints eating his breakfast  Examination:  General exam: Appears calm and comfortable  Respiratory system: Clear to auscultation. Respiratory effort normal. Cardiovascular system: S1 & S2 heard, RRR. No JVD, murmurs, rubs, gallops or clicks. No pedal edema. Gastrointestinal system: Abdomen is nondistended, soft and nontender. No organomegaly or masses felt. Normal bowel sounds heard. Central nervous system: Alert and oriented to name and place. No focal neurological deficits. Extremities: Symmetric 5 x 5 power. Skin: No rashes, lesions or ulcers Psychiatry: Judgement and insight appear poor                Diet Orders (From admission, onward)     Start     Ordered   04/13/24 1418  Diet Heart Room service appropriate? Yes; Fluid consistency: Thin  Diet effective now       Question Answer Comment  Room service appropriate? Yes   Fluid consistency: Thin  04/13/24 1418            Objective: Vitals:   04/24/24 0414 04/24/24 1313 04/24/24 1928 04/25/24 0543  BP: 134/73 99/66 128/73 (!) 147/86  Pulse: 96 87 86 93  Resp: 18 20 18 18   Temp: 98.1 F (36.7 C) 97.8 F (36.6 C) 98.4 F (36.9 C) 97.8 F (36.6 C)  TempSrc: Oral  Oral Oral   SpO2: 99% 95% 100% 98%  Weight:      Height:        Intake/Output Summary (Last 24 hours) at 04/25/2024 1126 Last data filed at 04/25/2024 9256 Gross per 24 hour  Intake --  Output 900 ml  Net -900 ml   Filed Weights   04/15/24 1739  Weight: 67.9 kg    Scheduled Meds:  amLODipine   10 mg Oral Daily   apalutamide   240 mg Oral Daily   Chlorhexidine  Gluconate Cloth  6 each Topical Daily   dexamethasone   4 mg Oral Q12H   enoxaparin  (LOVENOX ) injection  40 mg Subcutaneous Q24H   folic acid   1 mg Oral Daily   furosemide   20 mg Oral Daily   irbesartan   75 mg Oral Daily   morphine   60 mg Oral Q8H   multivitamin with minerals  1 tablet Oral Daily   nicotine   21 mg Transdermal Daily   polyethylene glycol  17 g Oral BID   senna-docusate  2 tablet Oral BID   thiamine   100 mg Oral Daily   Or   thiamine   100 mg Intravenous Daily   Continuous Infusions:  Nutritional status     Body mass index is 22.09 kg/m.  Data Reviewed:   CBC: Recent Labs  Lab 04/19/24 0632 04/22/24 0604 04/23/24 0014  WBC 10.1 14.9* 15.2*  HGB 11.4* 11.5* 11.8*  HCT 33.7* 34.9* 34.9*  MCV 112.7* 115.2* 115.2*  PLT 243 280 296   Basic Metabolic Panel: Recent Labs  Lab 04/19/24 0632 04/22/24 0604 04/23/24 0014 04/24/24 0600 04/25/24 0724  NA 137 134* 134* 133* 131*  K 4.3 4.9 5.1 4.6 4.0  CL 98 96* 96* 96* 95*  CO2 27 23 24 25 23   GLUCOSE 111* 120* 103* 122* 115*  BUN 19 26* 31* 25* 21  CREATININE 0.81 0.96 1.08 0.86 0.72  CALCIUM  10.1 10.7* 10.7* 10.2 10.3  MG  --   --  2.4  --   --   PHOS  --   --  3.8  --   --    GFR: Estimated Creatinine Clearance: 90.8 mL/min (by C-G formula based on SCr of 0.72 mg/dL). Liver Function Tests: Recent Labs  Lab 04/19/24 9367 04/22/24 0604 04/23/24 0014 04/24/24 0600 04/25/24 0724  AST 24 27 33 24 20  ALT 35 28 31 30 26   ALKPHOS 167* 159* 156* 140* 140*  BILITOT 0.4 0.5 0.4 0.3 0.4  PROT 7.1 7.8 7.7 6.9 7.4  ALBUMIN 4.0 4.4 4.2 4.1 4.3    No results for input(s): LIPASE, AMYLASE in the last 168 hours. Recent Labs  Lab 04/22/24 0839  AMMONIA <13   Coagulation Profile: No results for input(s): INR, PROTIME in the last 168 hours. Cardiac Enzymes: No results for input(s): CKTOTAL, CKMB, CKMBINDEX, TROPONINI in the last 168 hours. BNP (last 3 results) No results for input(s): PROBNP in the last 8760 hours. HbA1C: No results for input(s): HGBA1C in the last 72 hours. CBG: No results for input(s): GLUCAP in the last 168 hours. Lipid Profile: No results for input(s):  CHOL, HDL, LDLCALC, TRIG, CHOLHDL, LDLDIRECT in the last 72 hours. Thyroid  Function Tests: No results for input(s): TSH, T4TOTAL, FREET4, T3FREE, THYROIDAB in the last 72 hours. Anemia Panel: Recent Labs    04/23/24 0014  FOLATE 15.1   Sepsis Labs: No results for input(s): PROCALCITON, LATICACIDVEN in the last 168 hours.  No results found for this or any previous visit (from the past 240 hours).       Radiology Studies: No results found.         LOS: 12 days   Time spent= 35 mins    Burgess JAYSON Dare, MD [facility]  If 7PM-7AM, please contact night-coverage  04/25/2024, 11:26 AM

## 2024-04-26 DIAGNOSIS — R296 Repeated falls: Secondary | ICD-10-CM | POA: Diagnosis not present

## 2024-04-26 DIAGNOSIS — K5903 Drug induced constipation: Secondary | ICD-10-CM

## 2024-04-26 DIAGNOSIS — C61 Malignant neoplasm of prostate: Secondary | ICD-10-CM | POA: Diagnosis not present

## 2024-04-26 DIAGNOSIS — R531 Weakness: Secondary | ICD-10-CM | POA: Diagnosis not present

## 2024-04-26 DIAGNOSIS — C7951 Secondary malignant neoplasm of bone: Secondary | ICD-10-CM | POA: Diagnosis not present

## 2024-04-26 DIAGNOSIS — Z515 Encounter for palliative care: Secondary | ICD-10-CM | POA: Diagnosis not present

## 2024-04-26 LAB — COMPREHENSIVE METABOLIC PANEL WITH GFR
ALT: 26 U/L (ref 0–44)
AST: 25 U/L (ref 15–41)
Albumin: 4 g/dL (ref 3.5–5.0)
Alkaline Phosphatase: 130 U/L — ABNORMAL HIGH (ref 38–126)
Anion gap: 11 (ref 5–15)
BUN: 24 mg/dL — ABNORMAL HIGH (ref 8–23)
CO2: 26 mmol/L (ref 22–32)
Calcium: 10 mg/dL (ref 8.9–10.3)
Chloride: 95 mmol/L — ABNORMAL LOW (ref 98–111)
Creatinine, Ser: 0.87 mg/dL (ref 0.61–1.24)
GFR, Estimated: >60 mL/min (ref >60)
Glucose, Bld: 113 mg/dL — ABNORMAL HIGH (ref 70–99)
Potassium: 5.1 mmol/L (ref 3.5–5.1)
Sodium: 132 mmol/L — ABNORMAL LOW (ref 135–145)
Total Bilirubin: 0.3 mg/dL (ref 0.0–1.2)
Total Protein: 6.9 g/dL (ref 6.5–8.1)

## 2024-04-26 LAB — CBC
HCT: 31.3 % — ABNORMAL LOW (ref 39.0–52.0)
Hemoglobin: 10.8 g/dL — ABNORMAL LOW (ref 13.0–17.0)
MCH: 38 pg — ABNORMAL HIGH (ref 26.0–34.0)
MCHC: 34.5 g/dL (ref 30.0–36.0)
MCV: 110.2 fL — ABNORMAL HIGH (ref 80.0–100.0)
Platelets: 243 K/uL (ref 150–400)
RBC: 2.84 MIL/uL — ABNORMAL LOW (ref 4.22–5.81)
RDW: 12.6 % (ref 11.5–15.5)
WBC: 11.6 K/uL — ABNORMAL HIGH (ref 4.0–10.5)
nRBC: 0 % (ref 0.0–0.2)

## 2024-04-26 MED ORDER — LACTULOSE 10 GM/15ML PO SOLN: 20.0000 g | Freq: Two times a day (BID) | ORAL | Status: DC | PRN

## 2024-04-26 MED ORDER — HYDROMORPHONE HCL 1 MG/ML IJ SOLN
1.0000 mg | INTRAMUSCULAR | Status: DC | PRN
  Administered 2024-04-27 – 2024-04-28 (×2): 1 mg via INTRAVENOUS
  Filled 2024-04-26 (×2): qty 1

## 2024-04-26 MED ORDER — SENNOSIDES-DOCUSATE SODIUM 8.6-50 MG PO TABS
3.0000 | ORAL_TABLET | Freq: Two times a day (BID) | ORAL | Status: DC
  Administered 2024-04-27 – 2024-05-03 (×6): 3 via ORAL
  Filled 2024-04-26 (×11): qty 3

## 2024-04-26 NOTE — Progress Notes (Signed)
 PROGRESS NOTE    David Townsend  [name] DOB: June 07, 1960 DOA: 04/13/2024 PCP: Jaycee Greig PARAS, NP    Brief Narrative:   63 y.o. M with castration resistant metastatic prostate cancer, HTN, and alcohol use who presented with falls.   Recently hospitalized for uncontrolled cancer pain, medications titrated and he was discharged home.  However, at home, he was too weak to care for himself, fell multiples times and so was btought back to the hospital.  Here, he developed severe delirium on hospital day 2, which has improved but he remains extremely weak and with some short term memory issues/processing deficits.    During the hospitalization he was evaluated by radiation oncology and started on radiation which he completed on 11/20.  They also recommended Decadron  taper which has been prescribed.  Encephalopathy was slowly improving.  Multiple attempts for MRI of the brain were made but patient was unable to tolerate this. During the hospitalization he was also treated for with 5 days of Rocephin  and azithromycin  for presumed community-acquired pneumonia. Currently awaiting SNF placement  Assessment & Plan:  Acute metabolic encephalopathy  Etiology has been somewhat unclear.  Patient has not been able to tolerate MRI, multiple attempts have been made by the previous provider.  There is concerns that that could be cancer related delirium therefore on Decadron  weaning protocol as mentioned below.  Repeat ammonia, B12, TSH, folate - Standard delirium precautions.   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  Overall patient does have poor prognosis.  Patient seen by oncology who is recommending to continue to  finish palliative radiation.  Depending on progression, may offer further treatment otherwise continue to engage palliative care and consider hospice. Radiation completed 11/20  Community-acquired versus atypical pneumonia Treated with 5 days Rocephin  and azithromycin , now resolved.   HTN  Norvasc , Avapro , Lasix .  IV as needed   HLD - Lipitor held due to transaminitis, worsening prognosis    Transaminitis  Resolved  Labs and vitals remained stable.  No further lab work unless necessary  DVT prophylaxis: Lovenox     Code Status: Full Code Family Communication:   Status is: Inpatient Remains inpatient appropriate because: SNF placement  PT Follow up Recs: Skilled Nursing-Short Term Rehab (<3 Hours/Day)04/21/2024 1008  Subjective: No complaints   Examination:  General exam: Appears calm and comfortable  Respiratory system: Clear to auscultation. Respiratory effort normal. Cardiovascular system: S1 & S2 heard, RRR. No JVD, murmurs, rubs, gallops or clicks. No pedal edema. Gastrointestinal system: Abdomen is nondistended, soft and nontender. No organomegaly or masses felt. Normal bowel sounds heard. Central nervous system: Alert and oriented to name and place. No focal neurological deficits. Extremities: Symmetric 5 x 5 power. Skin: No rashes, lesions or ulcers Psychiatry: Judgement and insight appear poor                Diet Orders (From admission, onward)     Start     Ordered   04/13/24 1418  Diet Heart Room service appropriate? Yes; Fluid consistency: Thin  Diet effective now       Question Answer Comment  Room service appropriate? Yes   Fluid consistency: Thin      04/13/24  1418            Objective: Vitals:   04/24/24 1928 04/25/24 0543 04/25/24 2123 04/26/24 0608  BP: 128/73 (!) 147/86 124/85 131/73  Pulse: 86 93 90 87  Resp: 18 18 16 14   Temp: 98.4 F (36.9 C) 97.8 F (36.6 C) 98.7 F (37.1 C) 98.4 F (36.9 C)  TempSrc: Oral Oral Oral  Oral  SpO2: 100% 98% 97% 98%  Weight:      Height:        Intake/Output Summary (Last 24 hours) at 04/26/2024 1109 Last data filed at 04/26/2024 9385 Gross per 24 hour  Intake 120 ml  Output 1475 ml  Net -1355 ml   Filed Weights   04/15/24 1739  Weight: 67.9 kg    Scheduled Meds:  amLODipine   10 mg Oral Daily   apalutamide   240 mg Oral Daily   Chlorhexidine  Gluconate Cloth  6 each Topical Daily   dexamethasone   4 mg Oral Q12H   enoxaparin  (LOVENOX ) injection  40 mg Subcutaneous Q24H   folic acid   1 mg Oral Daily   furosemide   20 mg Oral Daily   irbesartan   75 mg Oral Daily   morphine   60 mg Oral Q8H   multivitamin with minerals  1 tablet Oral Daily   nicotine   21 mg Transdermal Daily   polyethylene glycol  17 g Oral BID   senna-docusate  2 tablet Oral BID   thiamine   100 mg Oral Daily   Or   thiamine   100 mg Intravenous Daily   Continuous Infusions:  Nutritional status     Body mass index is 22.09 kg/m.  Data Reviewed:   CBC: Recent Labs  Lab 04/22/24 0604 04/23/24 0014 04/26/24 0027  WBC 14.9* 15.2* 11.6*  HGB 11.5* 11.8* 10.8*  HCT 34.9* 34.9* 31.3*  MCV 115.2* 115.2* 110.2*  PLT 280 296 243   Basic Metabolic Panel: Recent Labs  Lab 04/22/24 0604 04/23/24 0014 04/24/24 0600 04/25/24 0724 04/26/24 0027  NA 134* 134* 133* 131* 132*  K 4.9 5.1 4.6 4.0 5.1  CL 96* 96* 96* 95* 95*  CO2 23 24 25 23 26   GLUCOSE 120* 103* 122* 115* 113*  BUN 26* 31* 25* 21 24*  CREATININE 0.96 1.08 0.86 0.72 0.87  CALCIUM  10.7* 10.7* 10.2 10.3 10.0  MG  --  2.4  --   --   --   PHOS  --  3.8  --   --   --    GFR: Estimated Creatinine Clearance: 83.5 mL/min (by C-G formula based on SCr of 0.87 mg/dL). Liver Function Tests: Recent Labs  Lab 04/22/24 0604 04/23/24 0014 04/24/24 0600 04/25/24 0724 04/26/24 0027  AST 27 33 24 20 25   ALT 28 31 30 26 26   ALKPHOS 159* 156* 140* 140* 130*  BILITOT 0.5 0.4 0.3 0.4 0.3  PROT 7.8 7.7 6.9 7.4 6.9  ALBUMIN 4.4  4.2 4.1 4.3 4.0   No results for input(s): LIPASE, AMYLASE in the last 168 hours. Recent Labs  Lab 04/22/24 0839  AMMONIA <13   Coagulation Profile: No results for input(s): INR, PROTIME in the last 168 hours. Cardiac Enzymes: No results for input(s): CKTOTAL, CKMB, CKMBINDEX, TROPONINI in the last 168 hours. BNP (last 3 results) No results for input(s): PROBNP in the last 8760 hours. HbA1C: No results for input(s): HGBA1C in the last 72 hours. CBG: No results for input(s): GLUCAP in the last 168 hours. Lipid Profile: No results for input(s):  CHOL, HDL, LDLCALC, TRIG, CHOLHDL, LDLDIRECT in the last 72 hours. Thyroid  Function Tests: No results for input(s): TSH, T4TOTAL, FREET4, T3FREE, THYROIDAB in the last 72 hours. Anemia Panel: No results for input(s): VITAMINB12, FOLATE, FERRITIN, TIBC, IRON , RETICCTPCT in the last 72 hours. Sepsis Labs: No results for input(s): PROCALCITON, LATICACIDVEN in the last 168 hours.  No results found for this or any previous visit (from the past 240 hours).       Radiology Studies: No results found.         LOS: 13 days   Time spent= 35 mins    Burgess JAYSON Dare, MD [facility]  If 7PM-7AM, please contact night-coverage  04/26/2024, 11:09 AM

## 2024-04-26 NOTE — Progress Notes (Signed)
 Daily Progress Note   Patient Name: David Townsend       Date: 04/26/2024 DOB: 1960-12-13  Age: 63 y.o. MRN#: [id number] Attending Physician: David Burgess BROCKS, MD Primary Care Physician: David Greig PARAS, NP Admit Date: 04/13/2024 Length of Stay: 13 days  Reason for Consultation/Follow-up: Establishing goals of care  Subjective:   CC: Patient feels oral pain regimen working well still. Following up regarding complex medical decision making.   Subjective:  Reviewed EMR including recent documentation from hospitalist.  Patient still awaiting possible rehab placement.  TOC managing discharge coordination.  At time of EMR review on past 24 hours patient has received as needed IV Dilaudid  1 mg x 1 dose and MS IR 15 mg x 2 doses.  Last BM documented in chart 04/23/2024.  Presented to see patient.  Patient sitting up comfortably in bed.  Patient able to update this provider about care planning.  Still waiting possible rehab placement.  TOC assisting with coordination of discharge planning.  Inquired about patient's symptom burden at this time.  Patient feels pain management currently working well.  Discussed continuing current oral pain regimen.  Patient continues to desire aggressive medical interventions.  All questions answered at that time.  Provide emotional support via active listening.  Noted palliative medicine team would be available if needed.  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  Patient had been recently hospitalized and discharged on 04/10/2024 for uncontrolled cancer pain.  Since admission, patient has received management for acute metabolic encephalopathy, cancer related pain, community-acquired versus atypical pneumonia, and transaminitis.,  Medicine team consulted to assist with complex medical decision making.  Recommendations/Plan: # Complex medical decision making/goals of care:  - Have engaged with multiple conversations with patient.  Oncologist was very clear that patient would have to regain strength and abstain from alcohol to be a candidate for further cancer directed therapies which would be palliative in nature, not curative.  Patient at this time continues to elect to pursue going to rehab to regain strength for follow-up with oncology in the outpatient setting.  Have already explained patient can elect for hospice involvement should he decide he wants to focus on comfort or is not a candidate for cancer directed therapies.  Palliative medicine team continuing to follow along with patient's medical journey.  - Patient does have ACP documentation on file naming friend, David Townsend, as HCPOA and primary alternate HCPOA, David Townsend.    -  Code Status: Full Code   - Have already discussed CODE STATUS extensively.  Patient currently wishing to remain full code and did not wish to discuss CODE STATUS further at this time.  # Symptom management: Patient is receiving these palliative interventions for symptom management  with an intent to improve quality of life.   - Pain, in the setting of metastatic prostate cancer   - Continue MS Contin  60 mg every 8 hours scheduled   -  Continue MS IR 15 mg every 4 hours as needed   - Continue IV Dilaudid  1 mg every 4 hours as needed breakthrough pain only after MS IR given    - Constipation, in setting of receiving opioids   Last BM in EMR noted 04/23/2024.    - Increase senna to 3 tab twice daily    - Continue MiraLAX  17 g twice daily    - Start lactulose  20 g twice daily as needed  # Discharge Planning: Skilled Nursing Facility for rehab with Palliative care service follow-up  -TOC assisting with coordination of discharge planning  Thank you for allowing the palliative care team to participate in the care David Townsend.  David Radar, DO Palliative Care Provider PMT # [phone]  If patient remains symptomatic despite maximum doses, please call PMT at [phone] between 0700 and 1900. Outside of these hours, please call attending, as PMT does not have night coverage.

## 2024-04-26 NOTE — Plan of Care (Signed)
  Problem: Education: Goal: Knowledge of General Education information will improve Description: Including pain rating scale, medication(s)/side effects and non-pharmacologic comfort measures Outcome: Progressing   Problem: Clinical Measurements: Goal: Ability to maintain clinical measurements within normal limits will improve Outcome: Progressing Goal: Will remain free from infection Outcome: Progressing   Problem: Activity: Goal: Risk for activity intolerance will decrease Outcome: Progressing   Problem: Elimination: Goal: Will not experience complications related to bowel motility Outcome: Progressing   Problem: Pain Managment: Goal: General experience of comfort will improve and/or be controlled Outcome: Progressing   Problem: Safety: Goal: Ability to remain free from injury will improve Outcome: Progressing   Problem: Skin Integrity: Goal: Risk for impaired skin integrity will decrease Outcome: Progressing

## 2024-04-26 NOTE — Plan of Care (Signed)
   Problem: Education: Goal: Knowledge of General Education information will improve Description: Including pain rating scale, medication(s)/side effects and non-pharmacologic comfort measures Outcome: Progressing   Problem: Health Behavior/Discharge Planning: Goal: Ability to manage health-related needs will improve Outcome: Progressing   Problem: Clinical Measurements: Goal: Ability to maintain clinical measurements within normal limits will improve Outcome: Progressing Goal: Will remain free from infection Outcome: Progressing Goal: Diagnostic test results will improve Outcome: Progressing   Problem: Activity: Goal: Risk for activity intolerance will decrease Outcome: Progressing   Problem: Nutrition: Goal: Adequate nutrition will be maintained Outcome: Progressing   Problem: Coping: Goal: Level of anxiety will decrease Outcome: Progressing   Problem: Pain Managment: Goal: General experience of comfort will improve and/or be controlled Outcome: Progressing   Problem: Safety: Goal: Ability to remain free from injury will improve Outcome: Progressing   Problem: Skin Integrity: Goal: Risk for impaired skin integrity will decrease Outcome: Progressing

## 2024-04-27 ENCOUNTER — Ambulatory Visit

## 2024-04-27 DIAGNOSIS — R296 Repeated falls: Secondary | ICD-10-CM | POA: Diagnosis not present

## 2024-04-27 MED ORDER — DEXAMETHASONE 4 MG PO TABS
2.0000 mg | ORAL_TABLET | Freq: Two times a day (BID) | ORAL | Status: AC
  Administered 2024-04-27 – 2024-05-03 (×13): 2 mg via ORAL
  Filled 2024-04-27 (×13): qty 1

## 2024-04-27 MED ORDER — DEXAMETHASONE 4 MG PO TABS: 2.0000 mg | ORAL_TABLET | ORAL | Status: DC

## 2024-04-27 MED ORDER — DEXAMETHASONE 4 MG PO TABS
2.0000 mg | ORAL_TABLET | Freq: Every day | ORAL | Status: DC
  Administered 2024-05-04: 2 mg via ORAL
  Filled 2024-04-27: qty 1

## 2024-04-27 NOTE — Progress Notes (Signed)
 Daily Progress Note   Patient Name: David Townsend       Date: 04/27/2024 DOB: Apr 15, 1961  Age: 63 y.o. MRN#: [id number] Attending Physician: Caleen Burgess BROCKS, MD Primary Care Physician: Jaycee Greig PARAS, NP Admit Date: 04/13/2024 Length of Stay: 14 days  Reason for Consultation/Follow-up: Establishing goals of care  Subjective:   CC: Patient feels oral pain regimen working well still. Following up regarding complex medical decision making.   Subjective:  Reviewed EMR including recent documentation from hospitalist.  Patient still awaiting possible rehab placement.  TOC managing discharge coordination.   Able to feed himself breakfast, no acute distress at present.   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  Patient had been recently hospitalized and discharged on 04/10/2024 for uncontrolled cancer pain.  Since admission, patient has received management for acute metabolic encephalopathy, cancer related pain, community-acquired versus atypical pneumonia, and transaminitis.,  Medicine team consulted to assist with complex medical decision making.  Recommendations/Plan: # Complex medical decision making/goals of  care:  - Have engaged with multiple conversations with patient.  Oncologist was very clear that patient would have to regain strength and abstain from alcohol to be a candidate for further cancer directed therapies which would be palliative in nature, not curative.  Patient at this time continues to elect to pursue going to rehab to regain strength for follow-up with oncology in the outpatient setting.  Have already explained patient can elect for hospice involvement should he decide he wants to focus on comfort or is not a candidate for cancer directed therapies.  Palliative medicine team continuing to follow along with patient's medical journey.  - Patient does have ACP documentation on file naming friend, [name], as HCPOA and primary alternate HCPOA, [name].    -  Code Status: Full Code   - Have already discussed CODE STATUS extensively.  Patient currently wishing to remain full code and did not wish to discuss CODE STATUS further at this time.  # Symptom management: Patient is receiving these palliative interventions for symptom management with an intent to improve quality of life.   - Pain, in the setting of metastatic prostate cancer   - Continue MS Contin  60 mg every 8 hours scheduled   - Continue MS IR 15 mg every 4 hours as needed   - Continue IV Dilaudid  1 mg every 4 hours as needed breakthrough pain only after MS IR given    - Constipation, in setting of receiving opioids          senna to 3 tab twice daily    - Continue MiraLAX  17 g twice daily      lactulose  20 g twice daily as needed  # Discharge Planning: Skilled  Nursing Facility for rehab with Palliative care service follow-up  -[facility] assisting with coordination of discharge planning  Thank you for allowing the palliative care team to participate in the care [name].  Mod MDM Lonia Serve MD.  Palliative Care Provider PMT # [phone]  If patient remains symptomatic despite maximum doses, please  call PMT at [phone] between 0700 and 1900. Outside of these hours, please call attending, as PMT does not have night coverage.

## 2024-04-27 NOTE — Progress Notes (Signed)
 PROGRESS NOTE    David Townsend  [name] DOB: Jan 25, 1961 DOA: 04/13/2024 PCP: Jaycee Greig PARAS, NP    Brief Narrative:   63 y.o. M with castration resistant metastatic prostate cancer, HTN, and alcohol use who presented with falls.   Recently hospitalized for uncontrolled cancer pain, medications titrated and he was discharged home.  However, at home, he was too weak to care for himself, fell multiples times and so was btought back to the hospital.  Here, he developed severe delirium on hospital day 2, which has improved but he remains extremely weak and with some short term memory issues/processing deficits.    During the hospitalization he was evaluated by radiation oncology and started on radiation which he completed on 11/20.  They also recommended Decadron  taper which has been prescribed.  Encephalopathy was slowly improving.  Multiple attempts for MRI of the brain were made but patient was unable to tolerate this. During the hospitalization he was also treated for with 5 days of Rocephin  and azithromycin  for presumed community-acquired pneumonia. Currently awaiting SNF placement  Assessment & Plan:  Acute metabolic encephalopathy  Etiology has been somewhat unclear.  Patient has not been able to tolerate MRI, multiple attempts have been made by the previous provider.  There is concerns that that could be cancer related delirium therefore on Decadron  weaning protocol as mentioned below.  Repeat ammonia, B12, TSH, folate - Standard delirium precautions.   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  Overall patient does have poor prognosis.  Patient seen by oncology who is recommending to continue to  finish palliative radiation.  Depending on progression, may offer further treatment otherwise continue to engage palliative care and consider hospice. Radiation completed 11/20  Community-acquired versus atypical pneumonia Treated with 5 days Rocephin  and azithromycin , now resolved.   HTN  Norvasc , Avapro , Lasix .  IV as needed   HLD - Lipitor held due to transaminitis, worsening prognosis    Transaminitis  Resolved  Labs and vitals remained stable.  No further lab work unless necessary  DVT prophylaxis: Lovenox     Code Status: Full Code Family Communication:   Status is: Inpatient Remains inpatient appropriate because: SNF placement  PT Follow up Recs: Skilled Nursing-Short Term Rehab (<3 Hours/Day)04/21/2024 1008  Subjective: No complaints   Examination:  General exam: Appears calm and comfortable  Respiratory system: Clear to auscultation. Respiratory effort normal. Cardiovascular system: S1 & S2 heard, RRR. No JVD, murmurs, rubs, gallops or clicks. No pedal edema. Gastrointestinal system: Abdomen is nondistended, soft and nontender. No organomegaly or masses felt. Normal bowel sounds heard. Central nervous system: Alert and oriented to name and place. No focal neurological deficits. Extremities: Symmetric 5 x 5 power. Skin: No rashes, lesions or ulcers Psychiatry: Judgement and insight appear poor                Diet Orders (From admission, onward)     Start     Ordered   04/13/24 1418  Diet Heart Room service appropriate? Yes; Fluid consistency: Thin  Diet effective now       Question Answer Comment  Room service appropriate? Yes   Fluid consistency: Thin      04/13/24  1418            Objective: Vitals:   04/26/24 1154 04/26/24 1250 04/26/24 2052 04/27/24 0613  BP: (!) 118/101 117/77 139/75 128/75  Pulse:  87 84 71  Resp:  16 14 14   Temp:  98.2 F (36.8 C) 98.9 F (37.2 C) 98 F (36.7 C)  TempSrc:  Oral Oral Oral  SpO2:  97% 100% 100%   Weight:      Height:        Intake/Output Summary (Last 24 hours) at 04/27/2024 1123 Last data filed at 04/27/2024 0000 Gross per 24 hour  Intake 1077 ml  Output 350 ml  Net 727 ml   Filed Weights   04/15/24 1739  Weight: 67.9 kg    Scheduled Meds:  amLODipine   10 mg Oral Daily   apalutamide   240 mg Oral Daily   Chlorhexidine  Gluconate Cloth  6 each Topical Daily   dexamethasone   2 mg Oral BID   Followed by   NOREEN ON 05/04/2024] dexamethasone   2 mg Oral Daily   Followed by   NOREEN ON 05/12/2024] dexamethasone   2 mg Oral QODAY   enoxaparin  (LOVENOX ) injection  40 mg Subcutaneous Q24H   folic acid   1 mg Oral Daily   furosemide   20 mg Oral Daily   irbesartan   75 mg Oral Daily   morphine   60 mg Oral Q8H   multivitamin with minerals  1 tablet Oral Daily   nicotine   21 mg Transdermal Daily   polyethylene glycol  17 g Oral BID   senna-docusate  3 tablet Oral BID   thiamine   100 mg Oral Daily   Or   thiamine   100 mg Intravenous Daily   Continuous Infusions:  Nutritional status     Body mass index is 22.09 kg/m.  Data Reviewed:   CBC: Recent Labs  Lab 04/22/24 0604 04/23/24 0014 04/26/24 0027  WBC 14.9* 15.2* 11.6*  HGB 11.5* 11.8* 10.8*  HCT 34.9* 34.9* 31.3*  MCV 115.2* 115.2* 110.2*  PLT 280 296 243   Basic Metabolic Panel: Recent Labs  Lab 04/22/24 0604 04/23/24 0014 04/24/24 0600 04/25/24 0724 04/26/24 0027  NA 134* 134* 133* 131* 132*  K 4.9 5.1 4.6 4.0 5.1  CL 96* 96* 96* 95* 95*  CO2 23 24 25 23 26   GLUCOSE 120* 103* 122* 115* 113*  BUN 26* 31* 25* 21 24*  CREATININE 0.96 1.08 0.86 0.72 0.87  CALCIUM  10.7* 10.7* 10.2 10.3 10.0  MG  --  2.4  --   --   --   PHOS  --  3.8  --   --   --    GFR: Estimated Creatinine Clearance: 83.5 mL/min (by C-G formula based on SCr of 0.87 mg/dL). Liver Function Tests: Recent Labs  Lab 04/22/24 0604 04/23/24 0014 04/24/24 0600 04/25/24 0724 04/26/24 0027  AST 27 33 24 20 25   ALT 28 31 30 26 26    ALKPHOS 159* 156* 140* 140* 130*  BILITOT 0.5 0.4 0.3 0.4 0.3  PROT 7.8 7.7 6.9 7.4 6.9  ALBUMIN 4.4 4.2 4.1 4.3 4.0   No results for input(s): LIPASE, AMYLASE in the last 168 hours. Recent Labs  Lab 04/22/24 0839  AMMONIA <13   Coagulation Profile: No results for input(s): INR, PROTIME in the last 168 hours. Cardiac Enzymes: No results for input(s): CKTOTAL, CKMB, CKMBINDEX, TROPONINI in the last 168 hours. BNP (last 3 results) No results for input(s): PROBNP in the last 8760 hours. HbA1C:  No results for input(s): HGBA1C in the last 72 hours. CBG: No results for input(s): GLUCAP in the last 168 hours. Lipid Profile: No results for input(s): CHOL, HDL, LDLCALC, TRIG, CHOLHDL, LDLDIRECT in the last 72 hours. Thyroid  Function Tests: No results for input(s): TSH, T4TOTAL, FREET4, T3FREE, THYROIDAB in the last 72 hours. Anemia Panel: No results for input(s): VITAMINB12, FOLATE, FERRITIN, TIBC, IRON , RETICCTPCT in the last 72 hours. Sepsis Labs: No results for input(s): PROCALCITON, LATICACIDVEN in the last 168 hours.  No results found for this or any previous visit (from the past 240 hours).       Radiology Studies: No results found.         LOS: 14 days   Time spent= 35 mins    Burgess JAYSON Dare, MD [facility]  If 7PM-7AM, please contact night-coverage  04/27/2024, 11:23 AM

## 2024-04-27 NOTE — Progress Notes (Addendum)
 Physical Therapy Treatment Patient Details Name: David Townsend MRN: [id number] DOB: [birth year] Today's Date: 04/27/2024   History of Present Illness Pt is a 63 y.o. male presenting to [facility] ED for cancer-related pain and  multiple falls. Recently in  for same , DC 11/7. PMH significant for prostate cancer with spinal metastases, rib fx, anemia of chronic disease, GERD, HTN, HLD, cancer-related pain, tobacco use disorder and alcohol use disorder. Imaging shows progression of diffuse bone metastasis to skull, ribs, spine, pelvis, humerus, and femurs.    PT Comments  Pt progressing well with mobility, he ambulated 70' x 2 with RW with standing rest break. Back pain 5/10 with ambulation. Pt declined use of TLSO with mobility. He stated he's never worn it.  Reviewed back precautions ( no bending, arching, twisting). Pt reports he does not have 24/7 assistance at home available.   If plan is discharge home, recommend the following: A lot of help with bathing/dressing/bathroom;Assist for transportation;Help with stairs or ramp for entrance;A little help with walking and/or transfers   Can travel by private vehicle     Yes  Equipment Recommendations  None recommended by PT    Recommendations for Other Services       Precautions / Restrictions Precautions Precautions: Back;Fall Precaution/Restrictions Comments: TLSO in room, pt refused to wear it, stated he doesn't know where he got it and has never worn it, pt declined to attempt log roll Required Braces or Orthoses: Spinal Brace Spinal Brace: Thoracolumbosacral orthotic Other Brace: TLSO was prescribed last admission - pt declined wearing it Restrictions Weight Bearing Restrictions Per Provider Order: No     Mobility  Bed Mobility   Bed Mobility: Supine to Sit     Supine to sit: Supervision, HOB elevated, Used rails          Transfers Overall transfer level: Needs assistance Equipment used: Rolling walker (2 wheels) Transfers:  Sit to/from Stand Sit to Stand: Contact guard assist           General transfer comment: VCs hand placement, pt declined donning TLSO    Ambulation/Gait Ambulation/Gait assistance: Contact guard assist Gait Distance (Feet): 70 Feet Assistive device: Rolling walker (2 wheels) Gait Pattern/deviations: Step-through pattern, Decreased stride length Gait velocity: decreased     General Gait Details: 97' x 2 with standing rest break, no loss of balance, pt rated back pain as 5/10 while walking   Stairs             Wheelchair Mobility     Tilt Bed    Modified Rankin (Stroke Patients Only)       Balance Overall balance assessment: Needs assistance Sitting-balance support: Feet supported Sitting balance-Leahy Scale: Fair Sitting balance - Comments: not challenged   Standing balance support: Bilateral upper extremity supported, During functional activity, Reliant on assistive device for balance Standing balance-Leahy Scale: Fair Standing balance comment: reliant on RW                            Communication Communication Communication: No apparent difficulties  Cognition Arousal: Alert Behavior During Therapy: WFL for tasks assessed/performed   PT - Cognitive impairments: No apparent impairments                         Following commands: Intact Following commands impaired: Only follows one step commands consistently    Cueing Cueing Techniques: Verbal cues  Exercises      General  Comments        Pertinent Vitals/Pain Pain Assessment Pain Score: 5  Pain Location: back Pain Intervention(s): Limited activity within patient's tolerance, Monitored during session, Premedicated before session, Repositioned    Home Living                          Prior Function            PT Goals (current goals can now be found in the care plan section) Acute Rehab PT Goals Patient Stated Goal: likes to watch sports and hang out with  his buddies; wants to go to rehab to get stronger PT Goal Formulation: With patient Time For Goal Achievement: 04/30/24 Potential to Achieve Goals: Good Progress towards PT goals: Progressing toward goals    Frequency    Min 2X/week      PT Plan      Co-evaluation              AM-PAC PT 6 Clicks Mobility   Outcome Measure  Help needed turning from your back to your side while in a flat bed without using bedrails?: A Little Help needed moving from lying on your back to sitting on the side of a flat bed without using bedrails?: A Little Help needed moving to and from a bed to a chair (including a wheelchair)?: A Little Help needed standing up from a chair using your arms (e.g., wheelchair or bedside chair)?: A Little Help needed to walk in hospital room?: A Little Help needed climbing 3-5 steps with a railing? : A Lot 6 Click Score: 17    End of Session Equipment Utilized During Treatment: Gait belt Activity Tolerance: Patient tolerated treatment well Patient left: in chair;with chair alarm set;with call bell/phone within reach Nurse Communication: Mobility status PT Visit Diagnosis: Unsteadiness on feet (R26.81)     Time: 8674-8655 PT Time Calculation (min) (ACUTE ONLY): 19 min  Charges:    $Gait Training: 8-22 mins PT General Charges $$ ACUTE PT VISIT: 1 Visit                     Sylvan Delon Copp PT 04/27/2024  Acute Rehabilitation Services  Office [phone]

## 2024-04-27 NOTE — Discharge Instructions (Signed)
 Outpatient follow-up with PCP in 1-2 weeks Outpatient follow-up with oncology and radiation oncology per their service SNF placement

## 2024-04-28 ENCOUNTER — Ambulatory Visit

## 2024-04-28 DIAGNOSIS — R296 Repeated falls: Secondary | ICD-10-CM | POA: Diagnosis not present

## 2024-04-28 NOTE — Plan of Care (Signed)
  Problem: Health Behavior/Discharge Planning: Goal: Ability to manage health-related needs will improve Outcome: Progressing   Problem: Clinical Measurements: Goal: Ability to maintain clinical measurements within normal limits will improve Outcome: Progressing Goal: Respiratory complications will improve Outcome: Progressing Goal: Cardiovascular complication will be avoided Outcome: Progressing   Problem: Education: Goal: Knowledge of General Education information will improve Description: Including pain rating scale, medication(s)/side effects and non-pharmacologic comfort measures Outcome: Not Progressing   Problem: Clinical Measurements: Goal: Will remain free from infection Outcome: Not Progressing Goal: Diagnostic test results will improve Outcome: Not Progressing

## 2024-04-28 NOTE — Progress Notes (Signed)
 OT Cancellation Note  Patient Details Name: David Townsend MRN: [id number] DOB: Dec 15, 1960   Cancelled Treatment:    Reason Eval/Treat Not Completed: (P) Patient declined, no reason specified, declined, states he has no OT needs today, walked with PT earlier, will check back tomorrow.  [name] 04/28/2024, 3:56 PM

## 2024-04-28 NOTE — Progress Notes (Signed)
 Daily Progress Note   Patient Name: David Townsend       Date: 04/28/2024 DOB: [birth year]  Age: 63 y.o. MRN#: [id number] Attending Physician: Caleen Burgess BROCKS, MD Primary Care Physician: Jaycee Greig PARAS, NP Admit Date: 04/13/2024 Length of Stay: 15 days  Reason for Consultation/Follow-up: Establishing goals of care  Subjective:   CC:   Following up regarding complex medical decision making.   Subjective:  Reviewed EMR including recent documentation from hospitalist.  Patient still awaiting possible rehab placement.  TOC managing discharge coordination.   Able to feed himself breakfast, no acute distress at present.   Objective:   Vital Signs:  BP 107/62 (BP Location: Right Arm)   Pulse 86   Temp 98.1 F (36.7 C) (Oral)   Resp 16   Ht 5' 9 (1.753 m)   Wt 67.9 kg   SpO2 98%   BMI 22.09 kg/m   Physical Exam: General: NAD, alert, chronically ill-appearing Cardiovascular: RRR Respiratory: no increased work of breathing noted, not in respiratory distress Abdomen: not distended Skin: no rashes or lesions on visible skin Neuro: Awake, appropriately interacting Psych: appropriately answers all questions  Assessment & Plan:   Assessment: Patient is a 63 year old male with a past medical history of castration resistant prostate cancer metastatic to bone, hypertension, hyperlipidemia, GERD, tobacco use disorder, and alcohol use disorder was admitted on 04/13/2024 for management of generalized weakness associated with falls at home.  Patient had been recently hospitalized and discharged on 04/10/2024 for uncontrolled cancer pain.  Since admission, patient has received management for acute metabolic encephalopathy, cancer related pain, community-acquired versus atypical pneumonia, and transaminitis.,  Medicine team consulted to assist with complex medical decision making.  Recommendations/Plan: # Complex medical decision making/goals of care:  - Have engaged with multiple conversations  with patient.  Oncologist was very clear that patient would have to regain strength and abstain from alcohol to be a candidate for further cancer directed therapies which would be palliative in nature, not curative.  Patient at this time continues to elect to pursue going to rehab to regain strength for follow-up with oncology in the outpatient setting.  Have already explained patient can elect for hospice involvement should he decide he wants to focus on comfort or is not a candidate for cancer directed therapies.  Palliative medicine team continuing to follow along with patient's medical journey.  - Patient does have ACP documentation on file naming friend, [name], as HCPOA and primary alternate HCPOA, [name].    -  Code Status: Full Code   - Have already discussed CODE STATUS extensively.  Patient currently wishing to remain full code and did not wish to discuss CODE STATUS further at this time.  # Symptom management: Patient is receiving these palliative interventions for symptom management with an intent to improve quality of life.   - Pain, in the setting of metastatic prostate cancer   - Continue MS Contin  60 mg every 8 hours scheduled   - Continue MS IR 15 mg every 4 hours as needed   - Continue IV Dilaudid  1 mg every 4 hours as needed breakthrough pain only after MS IR given Has only required one dose of IV Dilaudid  PRN in the past 24 hours.    - Constipation, in setting of receiving opioids          senna to 3 tab twice daily    - Continue MiraLAX  17 g twice daily      lactulose  20 g twice  daily as needed  # Discharge Planning: Skilled Nursing Facility for rehab with Palliative care service follow-up  -[facility] assisting with coordination of discharge planning - patient likely has a bed.   Thank you for allowing the palliative care team to participate in the care [name].  Low MDM Lonia Serve MD.  Palliative Care Provider PMT # [phone]  If patient  remains symptomatic despite maximum doses, please call PMT at [phone] between 0700 and 1900. Outside of these hours, please call attending, as PMT does not have night coverage.

## 2024-04-28 NOTE — Plan of Care (Signed)
  Problem: Clinical Measurements: Goal: Ability to maintain clinical measurements within normal limits will improve Outcome: Progressing Goal: Will remain free from infection Outcome: Progressing Goal: Cardiovascular complication will be avoided Outcome: Progressing   Problem: Education: Goal: Knowledge of General Education information will improve Description: Including pain rating scale, medication(s)/side effects and non-pharmacologic comfort measures Outcome: Not Progressing   Problem: Health Behavior/Discharge Planning: Goal: Ability to manage health-related needs will improve Outcome: Not Progressing   Problem: Clinical Measurements: Goal: Diagnostic test results will improve Outcome: Not Progressing Goal: Respiratory complications will improve Outcome: Not Progressing

## 2024-04-28 NOTE — Plan of Care (Signed)
  Problem: Education: Goal: Knowledge of General Education information will improve Description: Including pain rating scale, medication(s)/side effects and non-pharmacologic comfort measures Outcome: Progressing   Problem: Health Behavior/Discharge Planning: Goal: Ability to manage health-related needs will improve Outcome: Progressing   Problem: Clinical Measurements: Goal: Ability to maintain clinical measurements within normal limits will improve Outcome: Progressing Goal: Will remain free from infection Outcome: Progressing Goal: Diagnostic test results will improve Outcome: Progressing Goal: Respiratory complications will improve Outcome: Progressing Goal: Cardiovascular complication will be avoided Outcome: Progressing   Problem: Activity: Goal: Risk for activity intolerance will decrease 04/28/2024 1119 by Mozqueda Jaramillo, Lennart Res, RN Outcome: Progressing 04/28/2024 0719 by Mozqueda Jaramillo, Lennart Res, RN Outcome: Progressing   Problem: Nutrition: Goal: Adequate nutrition will be maintained 04/28/2024 1119 by Mozqueda Jaramillo, Lennart Res, RN Outcome: Progressing 04/28/2024 0719 by Mozqueda Jaramillo, Lennart Res, RN Outcome: Progressing   Problem: Coping: Goal: Level of anxiety will decrease 04/28/2024 1119 by Mozqueda Jaramillo, Lennart Res, RN Outcome: Progressing 04/28/2024 0719 by Mozqueda Jaramillo, Yassine Brunsman Alondra, RN Outcome: Progressing   Problem: Elimination: Goal: Will not experience complications related to bowel motility 04/28/2024 1119 by Mozqueda Jaramillo, Lennart Res, RN Outcome: Progressing 04/28/2024 0719 by Mozqueda Jaramillo, Filippa Yarbough Alondra, RN Outcome: Progressing Goal: Will not experience complications related to urinary retention 04/28/2024 1119 by Mozqueda Jaramillo, Lennart Res, RN Outcome: Progressing 04/28/2024 0719 by Mozqueda Jaramillo, Corinn Stoltzfus Alondra, RN Outcome: Progressing   Problem: Pain  Managment: Goal: General experience of comfort will improve and/or be controlled Outcome: Progressing   Problem: Safety: Goal: Ability to remain free from injury will improve 04/28/2024 1119 by Rosamond Mould, Lennart Res, RN Outcome: Progressing 04/28/2024 0719 by Mozqueda Jaramillo, Ringo Sherod Alondra, RN Outcome: Progressing   Problem: Skin Integrity: Goal: Risk for impaired skin integrity will decrease Outcome: Progressing

## 2024-04-28 NOTE — TOC Progression Note (Addendum)
 Transition of Care [facility]) - Progression Note    Patient Details  Name: David Townsend MRN: [id number] Date of Birth: 1961/03/25  [facility] of [facility]) CM/SW Contact  Toy LITTIE Agar, RN Phone Number:[phone]  04/28/2024, 11:00 AM  Clinical Narrative:    Patient has ben offer from [employer]. CM at bedside to present offer to patient. Patient would like to accept offer and transition to rehab asap. Cm spoke with [facility]CP[facility] [name] who confirms that [name] be the best option. CM has reached out to Kristie in admissions at [facility] there is no answer. Secure voicemail has been left. Awaiting return call to confirm that bed offer is still good.    1500 Cm spoke with Kristie  with admissions at [facility]. Facility can accept patient. Patients insurance is commercial, facility will need to start insurance authorization. Kristie will follow up with CM as soon as shara is approved.    Expected Discharge Plan: Skilled Nursing Facility Barriers to Discharge: Continued Medical Work up               Expected Discharge Plan and Services In-house Referral: NA Discharge Planning Services: CM Consult Post Acute Care Choice: NA Living arrangements for the past 2 months: Single Family Home                 DME Arranged: N/A DME Agency: NA       HH Arranged: NA HH Agency: NA         Social Drivers of Health (SDOH) Interventions SDOH Screenings   Food Insecurity: No Food Insecurity (04/13/2024)  Housing: Low Risk  (04/13/2024)  Transportation Needs: No Transportation Needs (04/13/2024)  Utilities: Not At Risk (04/13/2024)  Alcohol Screen: Medium Risk (12/04/2023)  Depression (PHQ2-9): Low Risk  (12/04/2023)  Tobacco Use: High Risk (04/13/2024)    Readmission Risk Interventions    04/16/2024    1:08 PM  Readmission Risk Prevention Plan  Transportation Screening Complete  Medication Review (RN Care Manager) Referral to Pharmacy  PCP or Specialist appointment  within 3-5 days of discharge Complete  HRI or Home Care Consult Complete  SW Recovery Care/Counseling Consult Complete  Palliative Care Screening Not Applicable  Skilled Nursing Facility Complete

## 2024-04-28 NOTE — Progress Notes (Signed)
 PROGRESS NOTE    David Townsend  [name] DOB: [birth year] DOA: 04/13/2024 PCP: Jaycee Greig PARAS, NP    Brief Narrative:   63 y.o. M with castration resistant metastatic prostate cancer, HTN, and alcohol use who presented with falls.   Recently hospitalized for uncontrolled cancer pain, medications titrated and he was discharged home.  However, at home, he was too weak to care for himself, fell multiples times and so was btought back to the hospital.  Here, he developed severe delirium on hospital day 2, which has improved but he remains extremely weak and with some short term memory issues/processing deficits.    During the hospitalization he was evaluated by radiation oncology and started on radiation which he completed on 11/20.  They also recommended Decadron  taper which has been prescribed.  Encephalopathy was slowly improving.  Multiple attempts for MRI of the brain were made but patient was unable to tolerate this. During the hospitalization he was also treated for with 5 days of Rocephin  and azithromycin  for presumed community-acquired pneumonia.  Currently awaiting SNF placement  Assessment & Plan:  Acute metabolic encephalopathy  Etiology has been somewhat unclear.  Patient has not been able to tolerate MRI, multiple attempts have been made by the previous provider.  There is concerns that that could be cancer related delirium therefore on Decadron  weaning protocol as mentioned below.  Repeat ammonia, B12, TSH, folate - Standard delirium precautions.   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  Overall patient does have poor prognosis.  Patient seen by oncology who is recommending to  continue to finish palliative radiation.  Depending on progression, may offer further treatment otherwise continue to engage palliative care and consider hospice. Radiation completed 11/20  Community-acquired versus atypical pneumonia Treated with 5 days Rocephin  and azithromycin , now resolved.   HTN  Norvasc , Avapro , Lasix .  IV as needed   HLD - Lipitor held due to transaminitis, worsening prognosis    Transaminitis  Resolved  Labs and vitals remained stable.  No further lab work unless necessary  DVT prophylaxis: Lovenox     Code Status: Full Code Family Communication:   Status is: Inpatient Remains inpatient appropriate because: SNF placement  PT Follow up Recs: Skilled Nursing-Short Term Rehab (<3 Hours/Day)04/21/2024 1008  Subjective: No complaints  Pending placement  Examination:  General exam: Appears calm and comfortable  Respiratory system: Clear to auscultation. Respiratory effort normal. Cardiovascular system: S1 & S2 heard, RRR. No JVD, murmurs, rubs, gallops or clicks. No pedal edema. Gastrointestinal system: Abdomen is nondistended, soft and nontender. No organomegaly or masses felt. Normal bowel sounds heard. Central nervous system: Alert and oriented to name and place. No focal neurological deficits. Extremities: Symmetric 5 x 5 power. Skin: No rashes, lesions or ulcers Psychiatry: Judgement and insight appear poor                Diet Orders (From admission, onward)     Start     Ordered   04/13/24 1418  Diet Heart Room service appropriate? Yes; Fluid consistency: Thin  Diet effective now       Question Answer Comment  Room service appropriate? Yes   Fluid consistency: Thin  04/13/24 1418            Objective: Vitals:   04/27/24 0613 04/27/24 1404 04/27/24 2033 04/28/24 0550  BP: 128/75 120/81 109/71 138/76  Pulse: 71 89 80 88  Resp: 14 18 18 18   Temp: 98 F (36.7 C) 97.8 F (36.6 C) 99.5 F (37.5 C) 97.7 F (36.5 C)   TempSrc: Oral Oral Oral Oral  SpO2: 100% 97% 99% 100%  Weight:      Height:        Intake/Output Summary (Last 24 hours) at 04/28/2024 0953 Last data filed at 04/28/2024 9178 Gross per 24 hour  Intake --  Output 900 ml  Net -900 ml   Filed Weights   04/15/24 1739  Weight: 67.9 kg    Scheduled Meds:  amLODipine   10 mg Oral Daily   apalutamide   240 mg Oral Daily   Chlorhexidine  Gluconate Cloth  6 each Topical Daily   dexamethasone   2 mg Oral BID   Followed by   NOREEN ON 05/04/2024] dexamethasone   2 mg Oral Daily   Followed by   NOREEN ON 05/12/2024] dexamethasone   2 mg Oral QODAY   enoxaparin  (LOVENOX ) injection  40 mg Subcutaneous Q24H   folic acid   1 mg Oral Daily   furosemide   20 mg Oral Daily   irbesartan   75 mg Oral Daily   morphine   60 mg Oral Q8H   multivitamin with minerals  1 tablet Oral Daily   nicotine   21 mg Transdermal Daily   polyethylene glycol  17 g Oral BID   senna-docusate  3 tablet Oral BID   thiamine   100 mg Oral Daily   Or   thiamine   100 mg Intravenous Daily   Continuous Infusions:  Nutritional status     Body mass index is 22.09 kg/m.  Data Reviewed:   CBC: Recent Labs  Lab 04/22/24 0604 04/23/24 0014 04/26/24 0027  WBC 14.9* 15.2* 11.6*  HGB 11.5* 11.8* 10.8*  HCT 34.9* 34.9* 31.3*  MCV 115.2* 115.2* 110.2*  PLT 280 296 243   Basic Metabolic Panel: Recent Labs  Lab 04/22/24 0604 04/23/24 0014 04/24/24 0600 04/25/24 0724 04/26/24 0027  NA 134* 134* 133* 131* 132*  K 4.9 5.1 4.6 4.0 5.1  CL 96* 96* 96* 95* 95*  CO2 23 24 25 23 26   GLUCOSE 120* 103* 122* 115* 113*  BUN 26* 31* 25* 21 24*  CREATININE 0.96 1.08 0.86 0.72 0.87  CALCIUM  10.7* 10.7* 10.2 10.3 10.0  MG  --  2.4  --   --   --   PHOS  --  3.8  --   --   --    GFR: Estimated Creatinine Clearance: 83.5 mL/min (by C-G formula based on SCr of 0.87 mg/dL). Liver Function Tests: Recent Labs  Lab 04/22/24 0604 04/23/24 0014 04/24/24 0600 04/25/24 0724  04/26/24 0027  AST 27 33 24 20 25   ALT 28 31 30 26 26   ALKPHOS 159* 156* 140* 140* 130*  BILITOT 0.5 0.4 0.3 0.4 0.3  PROT 7.8 7.7 6.9 7.4 6.9  ALBUMIN 4.4 4.2 4.1 4.3 4.0   No results for input(s): LIPASE, AMYLASE in the last 168 hours. Recent Labs  Lab 04/22/24 0839  AMMONIA <13   Coagulation Profile: No results for input(s): INR, PROTIME in the last 168 hours. Cardiac Enzymes: No results for input(s): CKTOTAL, CKMB, CKMBINDEX, TROPONINI in the last 168 hours. BNP (last 3 results) No results for input(s): PROBNP in the last 8760  hours. HbA1C: No results for input(s): HGBA1C in the last 72 hours. CBG: No results for input(s): GLUCAP in the last 168 hours. Lipid Profile: No results for input(s): CHOL, HDL, LDLCALC, TRIG, CHOLHDL, LDLDIRECT in the last 72 hours. Thyroid  Function Tests: No results for input(s): TSH, T4TOTAL, FREET4, T3FREE, THYROIDAB in the last 72 hours. Anemia Panel: No results for input(s): VITAMINB12, FOLATE, FERRITIN, TIBC, IRON , RETICCTPCT in the last 72 hours. Sepsis Labs: No results for input(s): PROCALCITON, LATICACIDVEN in the last 168 hours.  No results found for this or any previous visit (from the past 240 hours).       Radiology Studies: No results found.         LOS: 15 days   Time spent= 35 mins    Burgess JAYSON Dare, MD [facility]  If 7PM-7AM, please contact night-coverage  04/28/2024, 9:53 AM

## 2024-04-28 NOTE — Plan of Care (Signed)
  Problem: Activity: Goal: Risk for activity intolerance will decrease Outcome: Progressing   Problem: Nutrition: Goal: Adequate nutrition will be maintained Outcome: Progressing   Problem: Coping: Goal: Level of anxiety will decrease Outcome: Progressing   Problem: Elimination: Goal: Will not experience complications related to bowel motility Outcome: Progressing Goal: Will not experience complications related to urinary retention Outcome: Progressing   Problem: Safety: Goal: Ability to remain free from injury will improve Outcome: Progressing

## 2024-04-29 ENCOUNTER — Ambulatory Visit

## 2024-04-29 DIAGNOSIS — R296 Repeated falls: Secondary | ICD-10-CM | POA: Diagnosis not present

## 2024-04-29 MED ORDER — DEXAMETHASONE 2 MG PO TABS: ORAL_TABLET | ORAL | Status: DC

## 2024-04-29 NOTE — TOC Progression Note (Addendum)
 Transition of Care [facility]) - Progression Note    Patient Details  Name: David Townsend MRN: [id number] Date of Birth: 1960-07-10  [facility] CM/SW Contact  Toy LITTIE Agar, RN Phone Number:[phone]  04/29/2024, 12:09 PM  Clinical Narrative:    Cm attempted to contact Kristie in admissions at [facility]. There is no answer. Secure voicemail has been left.   1236 Kristie form admissions at [facility] returned call and confirms that insurance shara is still pending. Roxie states that she will continue to check frequently and patient will be able to come today with auth approved and d/c summary by 2pm-3pm. Roxie states that she is not sure that patient would be able to admit tomorrow due to holiday and staffing. Friday 11/28 would be the best day for admission if it does not happen today.    Expected Discharge Plan: Skilled Nursing Facility Barriers to Discharge: Continued Medical Work up               Expected Discharge Plan and Services In-house Referral: NA Discharge Planning Services: CM Consult Post Acute Care Choice: NA Living arrangements for the past 2 months: Single Family Home                 DME Arranged: N/A DME Agency: NA       HH Arranged: NA HH Agency: NA         Social Drivers of Health (SDOH) Interventions SDOH Screenings   Food Insecurity: No Food Insecurity (04/13/2024)  Housing: Low Risk  (04/13/2024)  Transportation Needs: No Transportation Needs (04/13/2024)  Utilities: Not At Risk (04/13/2024)  Alcohol Screen: Medium Risk (12/04/2023)  Depression (PHQ2-9): Low Risk  (12/04/2023)  Tobacco Use: High Risk (04/13/2024)    Readmission Risk Interventions    04/16/2024    1:08 PM  Readmission Risk Prevention Plan  Transportation Screening Complete  Medication Review (RN Care Manager) Referral to Pharmacy  PCP or Specialist appointment within 3-5 days of discharge Complete  HRI or Home Care Consult Complete  SW Recovery  Care/Counseling Consult Complete  Palliative Care Screening Not Applicable  Skilled Nursing Facility Complete

## 2024-04-29 NOTE — Progress Notes (Signed)
 Occupational Therapy Treatment Patient Details Name: David Townsend MRN: [id number] DOB: 10-11-1960 Today's Date: 04/29/2024   History of present illness Pt is a 63 y.o. male presenting to [facility] ED for cancer-related pain and  multiple falls. Recently in  for same , DC 11/7. PMH significant for prostate cancer with spinal metastases, rib fx, anemia of chronic disease, GERD, HTN, HLD, cancer-related pain, tobacco use disorder and alcohol use disorder. Imaging shows progression of diffuse bone metastasis to skull, ribs, spine, pelvis, humerus, and femurs.   OT comments  Pt educated in back precautions and generalizing in ADLs and IADLs. Mobilizing in bathroom and hall with RW and CGA, agrees he needs walker, declines TLSO. Pt continues to demonstrate memory and problem solving deficits, but improved from evaluation. Able to place lunch order independently. Eager to discharge to rehab and progress home. Reports he will have intermittent assistance at home of his girlfriend and will not need to do heavy work. Patient will benefit from continued inpatient follow up therapy, <3 hours/day.      If plan is discharge home, recommend the following:  A little help with walking and/or transfers;A little help with bathing/dressing/bathroom;Assistance with cooking/housework;Direct supervision/assist for medications management;Direct supervision/assist for financial management;Assist for transportation;Help with stairs or ramp for entrance   Equipment Recommendations  Other (comment) (defer)    Recommendations for Other Services      Precautions / Restrictions Precautions Precautions: Back;Fall Recall of Precautions/Restrictions: Impaired Precaution/Restrictions Comments: instructed in back precautions and lifting restrictions as related to ADLs and IADLs Required Braces or Orthoses: Spinal Brace Spinal Brace: Thoracolumbosacral orthotic Other Brace: TLSO was prescribed last admission - pt declined wearing  it Restrictions Weight Bearing Restrictions Per Provider Order: No       Mobility Bed Mobility               General bed mobility comments: sitting EOB at start and end of session    Transfers Overall transfer level: Needs assistance Equipment used: Rolling walker (2 wheels) Transfers: Sit to/from Stand Sit to Stand: Contact guard assist                 Balance Overall balance assessment: Needs assistance   Sitting balance-Leahy Scale: Good       Standing balance-Leahy Scale: Fair Standing balance comment: reliant on RW for dynamic balance                           ADL either performed or assessed with clinical judgement   ADL Overall ADL's : Needs assistance/impaired     Grooming: Contact guard assist;Standing;Wash/dry hands           Upper Body Dressing : Set up;Sitting   Lower Body Dressing: Set up;Sitting/lateral leans         Toileting - Clothing Manipulation Details (indicate cue type and reason): educated pt to avoid twisting with posterio pericare     Functional mobility during ADLs: Contact guard assist;Rolling walker (2 wheels)      Extremity/Trunk Assessment              Vision       Perception     Praxis     Communication Communication Communication: No apparent difficulties   Cognition Arousal: Alert Behavior During Therapy: WFL for tasks assessed/performed Cognition: Cognition impaired   Orientation impairments: Time, Situation Awareness: Intellectual awareness impaired, Online awareness impaired Memory impairment (select all impairments): Working civil service fast streamer, Conservation officer, historic buildings, Short-term memory  Executive functioning impairment (select all impairments): Initiation, Sequencing, Reasoning, Problem solving OT - Cognition Comments: pt with difficulty generalizing back precautions, reports wanting to go home and lift weights, educated to avoid lifting, pt verbalized understanding                  Following commands: Intact Following commands impaired: Only follows one step commands consistently      Cueing   Cueing Techniques: Verbal cues  Exercises      Shoulder Instructions       General Comments      Pertinent Vitals/ Pain       Pain Assessment Pain Assessment: Faces Faces Pain Scale: Hurts a little bit Pain Location: buttocks Pain Descriptors / Indicators: Sore Pain Intervention(s): Monitored during session  Home Living                                          Prior Functioning/Environment              Frequency  Min 2X/week        Progress Toward Goals  OT Goals(current goals can now be found in the care plan section)  Progress towards OT goals: Progressing toward goals  Acute Rehab OT Goals OT Goal Formulation: With patient Time For Goal Achievement: 05/13/24 Potential to Achieve Goals: Good ADL Goals Pt Will Perform Grooming: with modified independence;standing Pt Will Perform Upper Body Bathing: with modified independence;sitting Pt Will Perform Upper Body Dressing: with modified independence;sitting Pt Will Transfer to Toilet: with modified independence;ambulating Pt Will Perform Toileting - Clothing Manipulation and hygiene: with modified independence;sit to/from stand Additional ADL Goal #1: Pt will adhere to back precautions during mobility and ADLs.  Plan      Co-evaluation                 AM-PAC OT 6 Clicks Daily Activity     Outcome Measure   Help from another person eating meals?: None Help from another person taking care of personal grooming?: A Little Help from another person toileting, which includes using toliet, bedpan, or urinal?: A Little Help from another person bathing (including washing, rinsing, drying)?: A Little Help from another person to put on and taking off regular upper body clothing?: A Little Help from another person to put on and taking off regular lower body clothing?:  A Little 6 Click Score: 19    End of Session Equipment Utilized During Treatment: Gait belt;Rolling walker (2 wheels)  OT Visit Diagnosis: Unsteadiness on feet (R26.81);Muscle weakness (generalized) (M62.81);Other symptoms and signs involving cognitive function   Activity Tolerance Patient tolerated treatment well;No increased pain   Patient Left in bed;with call bell/phone within reach;with bed alarm set   Nurse Communication          Time: 8877-8855 OT Time Calculation (min): 22 min  Charges: OT General Charges $OT Visit: 1 Visit OT Treatments $Self Care/Home Management : 8-22 mins  David Townsend, OTR/L Acute Rehabilitation Services Office: [phone]   David Townsend 04/29/2024, 11:54 AM

## 2024-04-29 NOTE — Plan of Care (Signed)

## 2024-04-29 NOTE — Discharge Summary (Addendum)
 Physician Discharge Summary  David Townsend [name] DOB: 05/11/1961 DOA: 04/13/2024  PCP: Jaycee Greig PARAS, NP  Admit date: 04/13/2024 Discharge date: 05/04/2024  Admitted From: Home Disposition:  SNF  Discharge Condition:Stable CODE STATUS:FULL Diet recommendation: Regular  Brief/Interim Summary: 63 y.o. M with castration resistant metastatic prostate cancer, HTN, and alcohol use who presented with falls.  Recently hospitalized for uncontrolled cancer pain, medications titrated and he was discharged home.  However, at home, he was too weak to care for himself, fell multiples times and so was btought back to the hospital.  Here, he developed severe delirium on hospital day 2, which has improved but he remains extremely weak and with some short term memory issues/processing deficits.  During the hospitalization he was evaluated by radiation oncology and started on radiation which he completed on 11/20.  They also recommended Decadron  taper which has been prescribed.  His encephalopathy has significantly improved.  He completed 5 days course of antibiotics for community-acquired pneumonia.  Medically stable for discharge to SNF whenever possible  Following problems were addressed during the hospitalization:  Acute metabolic encephalopathy  Etiology has been somewhat unclear.  Patient has not been able to tolerate MRI, multiple attempts have been made by the previous provider.  There was concerns that that could be cancer related delirium therefore on Decadron  weaning protocol as mentioned below.  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  Overall  patient does have poor prognosis.  Patient seen by oncology who is recommending to continue to finish palliative radiation.  Depending on progression, may offer further treatment otherwise continue to engage palliative care and consider hospice. Radiation completed 11/20   Community-acquired versus atypical pneumonia Treated with 5 days Rocephin  and azithromycin , now resolved.   HTN On  Norvasc , Avapro    HLD - Lipitor held due to transaminitis, worsening prognosis    Transaminitis  Resolved   Discharge Diagnoses:  Principal Problem:   Frequent falls Active Problems:   ETOH abuse   Prostate cancer (HCC)   Weakness   Goals of care, counseling/discussion   Metastatic carcinoma to bone [facility])   Cancer associated pain   Prostate cancer metastatic to bone [facility])   Drug-induced constipation    Discharge Instructions  Discharge Instructions     Diet - low sodium heart healthy   Complete by: As directed    Discharge instructions   Complete by: As directed    1)Please take your medications as instructed 2)Do a CBC and BMP tests in a week   Increase activity slowly   Complete by: As directed       Allergies as of 05/04/2024   No Known Allergies      Medication List     STOP taking these medications    atorvastatin  20 MG tablet Commonly known as: LIPITOR   cyclobenzaprine  10 MG tablet Commonly known as: FLEXERIL    polyethylene glycol powder 17 GM/SCOOP powder Commonly known as: GLYCOLAX /MIRALAX  Replaced by: polyethylene glycol 17 g packet       TAKE these medications    amLODipine  10 MG tablet Commonly known as: NORVASC  Take 1 tablet (10 mg total) by  mouth daily.   dexamethasone  2 MG tablet Commonly known as: DECADRON  Take 1 tablet (2 mg total) by mouth 2 (two) times daily for 5 days, THEN 1 tablet (2 mg total) daily for 7 days, THEN 1 tablet (2 mg total) every other day for 7 days. Start taking on: April 29, 2024 What changed:  medication  strength See the new instructions.   Erleada  60 MG tablet Generic drug: apalutamide  Take 120-180 mg by mouth daily.   folic acid  1 MG tablet Commonly known as: FOLVITE  Take 1 tablet (1 mg total) by mouth daily.   furosemide  20 MG tablet Commonly known as: LASIX  Take 1 tablet (20 mg total) by mouth daily.   ibuprofen  800 MG tablet Commonly known as: ADVIL  Take 1 tablet (800 mg total) by mouth every 8 (eight) hours as needed for fever, mild pain or headache (alternate with tylenol  for fever).   Iron  (Ferrous Sulfate ) 325 (65 Fe) MG Tabs Take 325 mg by mouth daily.   lidocaine  5 % Commonly known as: Lidoderm  Place 1 patch onto the skin daily. Remove & Discard patch within 12 hours or as directed by MD   methocarbamol  500 MG tablet Commonly known as: ROBAXIN  Take 1 tablet (500 mg total) by mouth every 8 (eight) hours as needed for muscle spasms. What changed: when to take this   morphine  60 MG 12 hr tablet Commonly known as: MS CONTIN  Take 1 tablet (60 mg total) by mouth every 8 (eight) hours. What changed: Another medication with the same name was changed. Make sure you understand how and when to take each.   morphine  15 MG tablet Commonly known as: MSIR Take 1 tablet (15 mg total) by mouth every 4 (four) hours as needed for severe pain (pain score 7-10) or moderate pain (pain score 4-6). What changed:  medication strength how much to take   multivitamin with minerals Tabs tablet Take 1 tablet by mouth daily.   polyethylene glycol 17 g packet Commonly known as: MIRALAX  / GLYCOLAX  Take 17 g by mouth 2 (two) times daily. Replaces: polyethylene glycol powder 17 GM/SCOOP powder   Stool Softener/Laxative 50-8.6 MG tablet Generic drug: senna-docusate Take 2 tablets by mouth 2 (two) times daily.   thiamine  100 MG tablet Commonly known as: Vitamin B-1 Take 1 tablet (100 mg total) by mouth daily.   Tylenol  8 Hour Arthritis Pain 650 MG CR tablet Generic drug:  acetaminophen  Take 1,300-1,950 mg by mouth 2 (two) times daily as needed for pain.   valsartan  40 MG tablet Commonly known as: DIOVAN  Take 1 tablet (40 mg total) by mouth daily.         No Known Allergies  Consultations: Radiation oncology   Procedures/Studies: MR CERVICAL SPINE W WO CONTRAST Result Date: 04/14/2024 EXAM: MRI CERVICAL SPINE WITH AND WITHOUT CONTRAST 04/14/2024 02:57:42 PM TECHNIQUE: Multiplanar multisequence MRI of the cervical spine was performed without and with the administration of intravenous contrast. 8 mL (gadobutrol  (GADAVIST ) 1 MMOL/ML injection 8 mL GADOBUTROL  1 MMOL/ML IV SOLN). COMPARISON: MRI thoracic spine 04/14/2024 and 04/08/2024. No prior cervical spine imaging. CLINICAL HISTORY: Metastatic disease evaluation. FINDINGS: BONES AND ALIGNMENT: Mild reversal of the normal cervical lordosis. No significant listhesis. Preserved cervical vertebral body heights without a definite fracture identified. Widespread abnormal bone marrow signal consistent with known metastatic disease, most notable throughout the mid and lower cervical and included upper thoracic spine with skull base involvement noted as well (left occipital condyle). Prominent involvement of the right sided posterior  elements at C5. SPINAL CORD: Normal spinal cord size. Normal spinal cord signal. SOFT TISSUES: No paraspinal mass. Mildly prominent smooth dorsal epidural enhancement in a bilateral and symmetric fashion throughout the cervical spine as well as slightly more prominent dorsal and ventral epidural enhancement in the included upper thoracic spine. No masslike epidural enhancement. DISC LEVELS: C2-C3: Mild facet arthrosis without disc herniation or stenosis. C3-C4: Severe disc space narrowing. A broad based posterior disc osteophyte complex and mild facet arthrosis result in mild left sided spinal stenosis and mild to moderate right and severe left neural foraminal stenosis. C4-C5: Severe disc  space narrowing. A broad based posterior disc osteophyte complex and mild facet arthrosis result in mild spinal stenosis and severe right and moderate left neural foraminal stenosis. C5-C6: Moderate disc space narrowing. Disc bulging, uncovertebral spurring, and mild facet arthrosis result in mild spinal stenosis and severe bilateral neural foraminal stenosis. C6-C7: Severe disc space narrowing. A broad based posterior disc osteophyte complex and mild facet arthrosis result in mild spinal stenosis and severe bilateral neural foraminal stenosis. C7-T1: Severe facet arthrosis results in borderline to mild right and mild to moderate left neural foraminal stenosis without spinal stenosis. IMPRESSION: 1. Osseous metastatic disease throughout the cervical and including upper thoracic spine as well as involving the skull base. 2. Mildly prominent dorsal epidural enhancement throughout the cervical spine as well as ventral and dorsal epidural enhancement in the included upper thoracic spine. This is favored to reflect prominent epidural venous plexus, although small volume epidural tumor is not excluded. 3. Widespread disc degeneration with mild multilevel spinal stenosis and severe neural foraminal stenosis as above. Electronically signed by: Dasie Hamburg MD 04/14/2024 04:56 PM EST RP Workstation: [id number]   MR THORACIC SPINE W WO CONTRAST Result Date: 04/14/2024 EXAM: MRI THORACIC SPINE WITHOUT AND WITH INTRAVENOUS CONTRAST 04/14/2024 02:57:42 PM TECHNIQUE: Multiplanar multisequence MRI of the thoracic spine was performed without and with the administration of 8 mL of gadobutrol  (GADAVIST ) 1 MMOL/ML intravenous contrast. COMPARISON: MRI thoracic spine 04/08/2024. CLINICAL HISTORY: Myelopathy, acute, thoracic spine. FINDINGS: LIMITATIONS: The examination is motion degraded, including moderate to severe motion on the axial T2 gradient echo sequence and mild to moderate motion on other sequences. BONES AND ALIGNMENT:  Normal alignment. Extensive, widespread metastatic disease throughout the vertebral bodies and posterior elements of the thoracic spine has not significantly changed from the recent prior MRI. Multiple compression fractures are again seen as detailed on the prior MRI without evidence of interval progression, and this includes a T8 fracture with 30% height loss and mild retropulsion. There is also mild retropulsion at T4 and T6. Assessment for marrow edema is again limited due to widespread marrow signal abnormality. Mildly prominent ventral epidural enhancement throughout the upper and mid thoracic spine is similar to the prior MRI and again favored to predominantly reflect prominent epidural venous plexus, although small volume epidural tumor is difficult to exclude. No masslike epidural enhancement is present. SPINAL CORD: Questionable mild hazy T2/STIR hyperintensity throughout the mid thoracic spinal cord, although this is most conspicuous on the sagittal STIR sequence and is not clearly confirmed on the standard sagittal and axial T2 sequences and may be artifactual. SOFT TISSUES: Dorsal epidural lipomatosis is again noted and results in spinal stenosis with essentially complete CSF effacement from T3-T8 with mild mass effect on the spinal cord as before. IMPRESSION: 1. Unchanged appearance of widespread metastatic disease in the thoracic spine with multiple nonprogressive pathologic compression fractures. 2. Unchanged mildly prominent epidural enhancement in the  upper and mid thoracic spine, again favored to predominantly reflect prominent epidural venous plexus with small volume epidural tumor not excluded. No mass-like epidural enhancement. 3. Unchanged severe spinal stenosis with CSF effacement from T3-T8 predominantly due to epidural lipomatosis. Low level spinal cord edema is not excluded. Electronically signed by: Dasie Hamburg MD 04/14/2024 04:37 PM EST RP Workstation: [id number]   DG CHEST PORT 1  VIEW Result Date: 04/14/2024 CLINICAL DATA:  Pneumonia . EXAM: PORTABLE CHEST 1 VIEW COMPARISON:  04/13/2024 FINDINGS: The cardio pericardial silhouette is enlarged. Diffuse interstitial and patchy airspace disease again noted with progression of airspace disease in the left apex and right mid and lower lung. No substantial pleural effusion. The cardio pericardial silhouette is enlarged. Telemetry leads overlie the chest. Gaseous distention of colon noted in the visualized upper abdomen. IMPRESSION: Diffuse interstitial and patchy airspace disease with progression of airspace disease in the left apex and right mid and lower lung. Electronically Signed   By: Camellia Candle M.D.   On: 04/14/2024 07:45   DG Chest Port 1 View Result Date: 04/13/2024 CLINICAL DATA:  Shortness of breath, chest pain and back pain. Fall. EXAM: PORTABLE CHEST 1 VIEW COMPARISON:  04/10/2024 and CT chest 11/27/2023. FINDINGS: Trachea is midline. Heart size within normal limits. Diffuse hazy airspace opacification, somewhat upper and midlung zone predominant and progressive from 04/10/2024. No pleural effusions. IMPRESSION: Increasing hazy upper and midlung zone predominant airspace opacification may be due to a viral or atypical pneumonia. Electronically Signed   By: Newell Eke M.D.   On: 04/13/2024 12:53   DG CHEST PORT 1 VIEW Result Date: 04/10/2024 EXAM: 1 VIEW(S) XRAY OF THE CHEST 04/10/2024 07:07:00 AM COMPARISON: 11/26/2023 CLINICAL HISTORY: Dyspnea FINDINGS: LUNGS AND PLEURA: Low lung volumes. Bilateral ill defined peripheral and basilar airspace opacities. Mild predominantly perihilar interstitial prominence. Possible small left pleural effusion. No pulmonary edema. No pneumothorax. HEART AND MEDIASTINUM: No acute abnormality of the cardiac and mediastinal silhouettes. BONES AND SOFT TISSUES: No acute osseous abnormality. IMPRESSION: 1. Peripheral and basilar airspace opacities with mild predominantly perihilar  interstitial prominence, compatible with multifocal infection or inflammation. 2. Low lung volumes. 3. Possible small left pleural effusion. Electronically signed by: Katheleen Faes MD 04/10/2024 11:13 AM EST RP Workstation: [id number]   MR THORACIC SPINE W WO CONTRAST Result Date: 04/08/2024 EXAM: MRI THORACIC SPINE WITH AND WITHOUT INTRAVENOUS CONTRAST 04/08/2024 03:43:35 PM TECHNIQUE: Multiplanar multisequence MRI of the thoracic spine was performed with and without the administration of intravenous contrast. COMPARISON: MRI thoracic spine 09/19/2022. MRI lumbar spine 03/27/2024. CTA chest, abdomen, and pelvis 11/27/2023. CLINICAL HISTORY: Metastatic disease evaluation. Severe back pain. History of metastatic prostate cancer. FINDINGS: BONES AND ALIGNMENT: There is widespread metastatic disease involving every level in the thoracic spine which has greatly progressed from the prior thoracic spine MRI. There is complete or near complete marrow replacement in the T2 through T8 vertebral bodies with large lesions noted at T11 and T12 as well. Posterior element involvement is particularly extensive from T4 to T6. Multiple compression fractures are present. A T4 compression fracture is new from the prior thoracic MRI but was present on the interval chest CT with mildly progressive vertebral body height loss, now measuring 25%. A mild T3 compression fracture is new or progressive, with 10% vertebral body height loss. There are new T5 and T6 compression fractures with 20% height loss each. A T8 compression fracture is new or progressive with 30% height loss. Assessment for marrow edema associated with these fractures is  limited due to the underlying widespread marrow signal abnormality; however, there is a visible unhealed fracture line within the T8 vertebral body extending to the posterior cortex. T8 retropulsion measures 2 to 3 mm. Mildly prominent epidural enhancement, most prominent ventrally from T5 to T8, is  favored to reflect prominent epidural venous plexus without masslike enhancement seen to clearly indicate epidural tumor, although minimal epidural tumor in this region is not excluded. SPINAL CORD: There is apparent mild hyperintensity throughout the majority of the thoracic spinal cord on the sagittal STIR sequence which is not clearly confirmed on the standard sagittal T2 sequence. Assessment of the spinal cord on axial images is limited due to severe motion artifact. SOFT TISSUES: Dorsal epidural lipomatosis has increased from the prior thoracic spine MRI and results in spinal stenosis with essentially complete CSF effacement from T3 to T8. There is mild mass effect on the spinal cord in the region from T3 to T8. IMPRESSION: 1. Progressive, widespread metastatic disease throughout the thoracic spine. 2. Multiple new or progressive pathologic compression fractures, most notably at T8 where there is an unhealed fracture with 30% height loss and mild retropulsion. 3. 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 Mild spinal cord edema is not excluded with assessment limited by motion. Electronically signed by: Dasie Hamburg MD 04/08/2024 04:17 PM EST RP Workstation: [id number]      Subjective: Patient seen and examined at bedside today.  Hemodynamically stable.  Comfortable.  Sitting on the bed.  Alert and oriented.  Denying new complaints today.  Medically stable for discharge to SNF whenever possible  Discharge Exam: Vitals:   05/03/24 2058 05/04/24 0503  BP: 126/73 123/68  Pulse: 84 77  Resp: 18 16  Temp: 98 F (36.7 C) 97.6 F (36.4 C)  SpO2: 100% 100%   Vitals:   05/03/24 0511 05/03/24 1314 05/03/24 2058 05/04/24 0503  BP: 119/76 114/69 126/73 123/68  Pulse: 78 81 84 77  Resp: 16 20 18 16   Temp: 97.7 F (36.5 C) 98.1 F (36.7 C) 98 F (36.7 C) 97.6 F (36.4 C)  TempSrc: Oral Oral Oral Oral   SpO2: 98% 94% 100% 100%  Weight:      Height:        General: Pt is alert, awake, not in acute distress Cardiovascular: RRR, S1/S2 +, no rubs, no gallops Respiratory: CTA bilaterally, no wheezing, no rhonchi Abdominal: Soft, NT, ND, bowel sounds + Extremities: no edema, no cyanosis    The results of significant diagnostics from this hospitalization (including imaging, microbiology, ancillary and laboratory) are listed below for reference.     Microbiology: No results found for this or any previous visit (from the past 240 hours).   Labs: BNP (last 3 results) No results for input(s): BNP in the last 8760 hours. Basic Metabolic Panel: Recent Labs  Lab 05/03/24 0558  NA 134*  K 4.2  CL 99  CO2 24  GLUCOSE 96  BUN 16  CREATININE 0.66  CALCIUM  10.0   Liver Function Tests: Recent Labs  Lab 05/03/24 0558  AST 20  ALT 23  ALKPHOS 167*  BILITOT 0.3  PROT 7.4  ALBUMIN 4.4   No results for input(s): LIPASE, AMYLASE in the last 168 hours. No results for input(s): AMMONIA in the last 168 hours. CBC: Recent Labs  Lab 05/03/24 0558  WBC 9.1  HGB 10.9*  HCT 32.9*  MCV 114.6*  PLT 230   Cardiac  Enzymes: No results for input(s): CKTOTAL, CKMB, CKMBINDEX, TROPONINI in the last 168 hours. BNP: Invalid input(s): POCBNP CBG: No results for input(s): GLUCAP in the last 168 hours. D-Dimer No results for input(s): DDIMER in the last 72 hours. Hgb A1c No results for input(s): HGBA1C in the last 72 hours. Lipid Profile No results for input(s): CHOL, HDL, LDLCALC, TRIG, CHOLHDL, LDLDIRECT in the last 72 hours. Thyroid  function studies No results for input(s): TSH, T4TOTAL, T3FREE, THYROIDAB in the last 72 hours.  Invalid input(s): FREET3 Anemia work up No results for input(s): VITAMINB12, FOLATE, FERRITIN, TIBC, IRON , RETICCTPCT in the last 72 hours. Urinalysis    Component Value Date/Time   COLORURINE  YELLOW 04/13/2024 1218   APPEARANCEUR HAZY (A) 04/13/2024 1218   LABSPEC 1.016 04/13/2024 1218   PHURINE 5.0 04/13/2024 1218   GLUCOSEU NEGATIVE 04/13/2024 1218   HGBUR MODERATE (A) 04/13/2024 1218   BILIRUBINUR NEGATIVE 04/13/2024 1218   BILIRUBINUR negative 08/12/2019 1130   KETONESUR 80 (A) 04/13/2024 1218   PROTEINUR 30 (A) 04/13/2024 1218   UROBILINOGEN 0.2 08/12/2019 1130   NITRITE POSITIVE (A) 04/13/2024 1218   LEUKOCYTESUR TRACE (A) 04/13/2024 1218   Sepsis Labs Recent Labs  Lab 05/03/24 0558  WBC 9.1   Microbiology No results found for this or any previous visit (from the past 240 hours).  Please note: You were cared for by a hospitalist during your hospital stay. Once you are discharged, your primary care physician will handle any further medical issues. Please note that NO REFILLS for any discharge medications will be authorized once you are discharged, as it is imperative that you return to your primary care physician (or establish a relationship with a primary care physician if you do not have one) for your post hospital discharge needs so that they can reassess your need for medications and monitor your lab values.    Time coordinating discharge: 40 minutes  SIGNED:   Ivonne Mustache, MD  [facility] 05/04/2024, 11:06 AM Pager [phone]  If 7PM-7AM, please contact night-coverage [email or web address] Password [email or web address]

## 2024-04-30 ENCOUNTER — Ambulatory Visit

## 2024-04-30 NOTE — Progress Notes (Signed)
 Mobility Specialist - Progress Note   04/30/24 1013  Mobility  Activity Ambulated with assistance  Level of Assistance Standby assist, set-up cues, supervision of patient - no hands on  Assistive Device Front wheel walker  Distance Ambulated (ft) 200 ft  Range of Motion/Exercises Active  Activity Response Tolerated well  Mobility visit 1 Mobility  Mobility Specialist Start Time (ACUTE ONLY) 0940  Mobility Specialist Stop Time (ACUTE ONLY) 1013  Mobility Specialist Time Calculation (min) (ACUTE ONLY) 33 min   Pt was found in bed and agreeable to mobilize. Requested to be wheeled outside. RN agreeable to taking pt outside. Afterwards pt ambulated in hallway and returned to sit EOB. All needs met. Call bell in reach.   Erminio Leos,  Mobility Specialist Can be reached via Secure Chat

## 2024-04-30 NOTE — Plan of Care (Signed)
  Problem: Education: Goal: Knowledge of General Education information will improve Description: Including pain rating scale, medication(s)/side effects and non-pharmacologic comfort measures Outcome: Progressing   Problem: Health Behavior/Discharge Planning: Goal: Ability to manage health-related needs will improve Outcome: Progressing   Problem: Clinical Measurements: Goal: Respiratory complications will improve Outcome: Progressing Goal: Cardiovascular complication will be avoided Outcome: Progressing   Problem: Clinical Measurements: Goal: Ability to maintain clinical measurements within normal limits will improve Outcome: Not Progressing Goal: Will remain free from infection Outcome: Not Progressing Goal: Diagnostic test results will improve Outcome: Not Progressing

## 2024-04-30 NOTE — Plan of Care (Signed)

## 2024-04-30 NOTE — Progress Notes (Signed)
 Patient seen and examined at bedside today.  Hemodynamically stable.  No new complaints.  Alert and oriented.  Comfortable.  Medically stable for discharge to SNF whenever possible.  Discharge likely tomorrow.  No new change  in the medical management.  Discharge summary/order were already placed on 11/26

## 2024-05-01 ENCOUNTER — Ambulatory Visit

## 2024-05-01 NOTE — Progress Notes (Signed)
 Patient seen and examined at bedside today.  Hemodynamically stable.  Sitting on the bed.  Very eager to leave to skilled nursing facility.  No new complaints.  Discharge orders and summary have already been placed.  TOC working on SNF bed.  No new change  in the medical management

## 2024-05-01 NOTE — TOC Progression Note (Addendum)
 Transition of Care [facility]) - Progression Note    Patient Details  Name: David Townsend MRN: [id number] Date of Birth: 1960-12-26  Transition of Care [facility]) CM/SW Contact  Toy LITTIE Agar, RN Phone Number:[phone]  05/01/2024, 9:59 AM  Clinical Narrative:    CM has reached out to Kristie in admissions at [facility] to confirm insurance auth status. There is no answer. Secure voicemail has been left.   1130 Cm called main line at [facility] [phone] spoke with receptionist who states that no one is in the building from admissions today but will transfer me to the social worker. CM transferred to SW with no answer. Voicemail has been left requesting return call.  1248 CM has attempted to reach SW at [facility] again. There is no answer voicemail has been left. Patients POA [name] is at bedside and has been updated on CM not being able to contact anyone at the facility. Per [name] patient is not safe at home because he will be alone.   1356 CM has called [facility] again speaking with receptionist. CM made receptionist aware that CM is unable to reach the SW and that several messages have been left. Receptionist states that she is not sure if the social worker is in today but she will try to connect me with someone else that may be able to assist. CM was transferred to [employer] (spelling may not be correct voice was not very clear on voicemail) there is no answer. CM left secure voicemail explaining the need to find out the status on insurance authorization and patient being admitted to facility.   1612 Cm received call from patient inquiring about home health. CM made patient aware that [facility] can be set up but that patient should not expect HH to come to the home immediately and that [facility] will be there to support with therapy sessions but not for extended periods in the home. Patient verbalized understanding and confirms that he will hold tight for now.   Expected Discharge Plan:  Skilled Nursing Facility Barriers to Discharge: Insurance Authorization               Expected Discharge Plan and Services In-house Referral: NA Discharge Planning Services: CM Consult Post Acute Care Choice: NA Living arrangements for the past 2 months: Single Family Home                 DME Arranged: N/A DME Agency: NA       HH Arranged: NA HH Agency: NA         Social Drivers of Health (SDOH) Interventions SDOH Screenings   Food Insecurity: No Food Insecurity (04/13/2024)  Housing: Low Risk  (04/13/2024)  Transportation Needs: No Transportation Needs (04/13/2024)  Utilities: Not At Risk (04/13/2024)  Alcohol Screen: Medium Risk (12/04/2023)  Depression (PHQ2-9): Low Risk  (12/04/2023)  Tobacco Use: High Risk (04/13/2024)    Readmission Risk Interventions    04/16/2024    1:08 PM  Readmission Risk Prevention Plan  Transportation Screening Complete  Medication Review (RN Care Manager) Referral to Pharmacy  PCP or Specialist appointment within 3-5 days of discharge Complete  HRI or Home Care Consult Complete  SW Recovery Care/Counseling Consult Complete  Palliative Care Screening Not Applicable  Skilled Nursing Facility Complete

## 2024-05-01 NOTE — Progress Notes (Signed)
 Daily Progress Note   Patient Name: David Townsend       Date: 05/01/2024 DOB: 07-30-1960  Age: 63 y.o. MRN#: [id number] Attending Physician: Jillian Buttery, MD Primary Care Physician: Jaycee Greig PARAS, NP Admit Date: 04/13/2024 Length of Stay: 18 days  Reason for Consultation/Follow-up: Establishing goals of care  Subjective:   CC:   Following up regarding complex medical decision making.   Subjective:  Reviewed EMR including recent discharge summary from hospitalist.     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  Patient had been recently hospitalized and discharged on 04/10/2024 for uncontrolled cancer pain.  Since admission, patient has received management for acute metabolic encephalopathy, cancer related pain, community-acquired versus atypical pneumonia, and transaminitis.,  Medicine team consulted to assist with complex medical decision making.  Recommendations/Plan: # Complex medical decision making/goals of care:  - Have engaged with multiple conversations with patient.  Oncologist was very clear that patient would have to regain strength and abstain from alcohol to be a candidate for further cancer directed therapies which  would be palliative in nature, not curative.  Patient at this time continues to elect to pursue going to rehab to regain strength for follow-up with oncology in the outpatient setting.  Have already explained patient can elect for hospice involvement should he decide he wants to focus on comfort or is not a candidate for cancer directed therapies.  Palliative medicine team continuing to follow along with patient's medical journey.  - Patient does have ACP documentation on file naming friend, [name], as HCPOA and primary alternate HCPOA, [name].    -  Code Status: Full Code   - Have already discussed CODE STATUS extensively.  Patient currently wishing to remain full code and did not wish to discuss CODE STATUS further at this time.  # Symptom management: Patient is receiving these palliative interventions for symptom management with an intent to improve quality of life.   - Pain, in the setting of metastatic prostate cancer   - Continue MS Contin  60 mg every 8 hours scheduled   - Continue MS IR 15 mg every 4 hours as needed       - Constipation, in setting of receiving opioids          senna  3 tab twice daily    - Continue MiraLAX  17 g twice daily      lactulose  20 g twice daily as needed  # Discharge Planning: Skilled Nursing Facility for rehab with Palliative care service follow-up  -TOC assisting with coordination of discharge planning - patient likely has a bed and will be discharged today.   Thank you for allowing the palliative care team to participate in the care [name].  Low MDM Analiyah Lechuga  Jeryl MD.  Palliative Care Provider PMT # [phone]  If patient remains symptomatic despite maximum doses, please call PMT at [phone] between 0700 and 1900. Outside of these hours, please call attending, as PMT does not have night coverage.

## 2024-05-01 NOTE — Progress Notes (Signed)
 Physical Therapy Treatment Patient Details Name: David Townsend MRN: [id number] DOB: 04/03/1961 Today's Date: 05/01/2024   History of Present Illness Pt is a 63 y.o. male presenting to [facility] ED for cancer-related pain and  multiple falls. Recently in  for same , DC 11/7. PMH significant for prostate cancer with spinal metastases, rib fx, anemia of chronic disease, GERD, HTN, HLD, cancer-related pain, tobacco use disorder and alcohol use disorder. Imaging shows progression of diffuse bone metastasis to skull, ribs, spine, pelvis, humerus, and femurs.    PT Comments   Pt admitted with above diagnosis.  Pt currently with functional limitations due to the deficits listed below (see PT Problem List). Pt standing in doorway of personal room with RW and foley placed on RW when PT arrived. Pt inquiring as to when he is going to d/c to rehab. Pt is motivated to get stronger and go home. Pt has limited support in home setting at this time and exhibits poor safety awareness and recall of back precautions 1/3.  Pt is mod I bed mobility, transfer tasks with and without AD, and gait tasks with RW 120 feet however as stated above pt exhibits poor safety awareness and recall of back precautions as demonstrated by pt reaching and bending to place foley on foot of bed, twisting back while seated on EOB, amb in personal room no AD and foley attached to bed. Pt will require ongoing reinforcement for safety, fall risk prevention, observation and recall of back precautions. Gait trial today no AD 70 feet with CGA and min cues, no overt LOB. Pt returned to EOB per pt preference and all needs in place. Patient will benefit from continued inpatient follow up therapy, <3 hours/day. Pt will benefit from acute skilled PT to increase their independence and safety with mobility to allow discharge.      If plan is discharge home, recommend the following: Assist for transportation;Help with stairs or ramp for entrance;A little help with  walking and/or transfers;A little help with bathing/dressing/bathroom   Can travel by private vehicle     Yes  Equipment Recommendations  None recommended by PT    Recommendations for Other Services       Precautions / Restrictions Precautions Precautions: Back;Fall Recall of Precautions/Restrictions: Impaired Precaution/Restrictions Comments: instructed in back precautions and lifting restrictions as related to ADLs and IADLs Required Braces or Orthoses: Spinal Brace Spinal Brace: Thoracolumbosacral orthotic Other Brace: TLSO was prescribed last admission - pt declined and is still declining to wear TLSO Restrictions Weight Bearing Restrictions Per Provider Order: No Other Position/Activity Restrictions:  (Spinal precautions)     Mobility  Bed Mobility               General bed mobility comments: pt returned to EOB s/p PT intervention    Transfers Overall transfer level: Modified independent                 General transfer comment: pt perfoming transfer tasks in personal room no AD or with RW pt exhibits difficulty with recall of foley management    Ambulation/Gait Ambulation/Gait assistance: Modified independent (Device/Increase time), Contact guard assist Gait Distance (Feet): 120 Feet Assistive device: Rolling walker (2 wheels) Gait Pattern/deviations: Step-through pattern Gait velocity: WFL     General Gait Details: 120 feet mod I with use of RW, gait trial no AD s/p standing theraputic rest break 75 feet with CGA and min cues no overt LOB without AD   Stairs  Wheelchair Mobility     Tilt Bed    Modified Rankin (Stroke Patients Only)       Balance Overall balance assessment: Needs assistance Sitting-balance support: Feet supported Sitting balance-Leahy Scale: Good Sitting balance - Comments: not challenged   Standing balance support: No upper extremity supported Standing balance-Leahy Scale: Good Standing balance  comment: no UE support no overt LOB                            Communication Communication Communication: No apparent difficulties  Cognition Arousal: Alert Behavior During Therapy: WFL for tasks assessed/performed   PT - Cognitive impairments: No apparent impairments                       PT - Cognition Comments: pt demonstrates poor insight to current functional deficits, absent recall for back precautions Following commands: Intact Following commands impaired: Follows multi-step commands inconsistently    Cueing Cueing Techniques: Verbal cues, Visual cues  Exercises      General Comments General comments (skin integrity, edema, etc.): pt demonstrates absent recall for back precuations with the exception of no lifting, pt declines to don TLSO, no reports of back pain during therapy session      Pertinent Vitals/Pain Pain Assessment Pain Assessment: No/denies pain    Home Living                          Prior Function            PT Goals (current goals can now be found in the care plan section) Acute Rehab PT Goals Patient Stated Goal: likes to watch sports and hang out with his buddies; wants to go to rehab to get stronger PT Goal Formulation: With patient Time For Goal Achievement: 05/15/24 Potential to Achieve Goals: Good Progress towards PT goals: Progressing toward goals    Frequency    Min 3X/week      PT Plan      Co-evaluation              AM-PAC PT 6 Clicks Mobility   Outcome Measure  Help needed turning from your back to your side while in a flat bed without using bedrails?: None Help needed moving from lying on your back to sitting on the side of a flat bed without using bedrails?: None Help needed moving to and from a bed to a chair (including a wheelchair)?: None Help needed standing up from a chair using your arms (e.g., wheelchair or bedside chair)?: None Help needed to walk in hospital room?: A Little  (with RW) Help needed climbing 3-5 steps with a railing? : A Lot 6 Click Score: 21    End of Session Equipment Utilized During Treatment: Gait belt Activity Tolerance: Patient tolerated treatment well Patient left:  (EOB) Nurse Communication: Mobility status PT Visit Diagnosis: Unsteadiness on feet (R26.81) Pain - part of body:  (Back)     Time: 8946-8891 PT Time Calculation (min) (ACUTE ONLY): 15 min  Charges:    $Gait Training: 8-22 mins PT General Charges $$ ACUTE PT VISIT: 1 Visit                     [place], PT Acute Rehab    [place] VEAR Drone 05/01/2024, 2:04 PM

## 2024-05-02 NOTE — Plan of Care (Signed)
  Problem: Education: Goal: Knowledge of General Education information will improve Description: Including pain rating scale, medication(s)/side effects and non-pharmacologic comfort measures Outcome: Progressing   Problem: Health Behavior/Discharge Planning: Goal: Ability to manage health-related needs will improve Outcome: Progressing   Problem: Clinical Measurements: Goal: Ability to maintain clinical measurements within normal limits will improve Outcome: Progressing Goal: Will remain free from infection Outcome: Progressing Goal: Diagnostic test results will improve Outcome: Progressing Goal: Respiratory complications will improve Outcome: Progressing Goal: Cardiovascular complication will be avoided Outcome: Progressing   Problem: Activity: Goal: Risk for activity intolerance will decrease Outcome: Progressing   Problem: Nutrition: Goal: Adequate nutrition will be maintained Outcome: Progressing   Problem: Coping: Goal: Level of anxiety will decrease Outcome: Progressing   Problem: Elimination: Goal: Will not experience complications related to bowel motility Outcome: Progressing Goal: Will not experience complications related to urinary retention Outcome: Progressing   Problem: Skin Integrity: Goal: Risk for impaired skin integrity will decrease Outcome: Progressing   

## 2024-05-02 NOTE — Progress Notes (Signed)
 Patient seen and examined at bedside today.  Hemodynamically stable.  Sitting in the bed.  Very frustrated about being here and not able to be discharged to SNF no new complaints.  Remains comfortable.  Medically stable for discharge whenever possible. Dc order/summary already on place

## 2024-05-02 NOTE — Plan of Care (Signed)
   Problem: Activity: Goal: Risk for activity intolerance will decrease Outcome: Progressing   Problem: Pain Managment: Goal: General experience of comfort will improve and/or be controlled Outcome: Progressing   Problem: Safety: Goal: Ability to remain free from injury will improve Outcome: Progressing

## 2024-05-03 LAB — COMPREHENSIVE METABOLIC PANEL WITH GFR
ALT: 23 U/L (ref 0–44)
AST: 20 U/L (ref 15–41)
Albumin: 4.4 g/dL (ref 3.5–5.0)
Alkaline Phosphatase: 167 U/L — ABNORMAL HIGH (ref 38–126)
Anion gap: 12 (ref 5–15)
BUN: 16 mg/dL (ref 8–23)
CO2: 24 mmol/L (ref 22–32)
Calcium: 10 mg/dL (ref 8.9–10.3)
Chloride: 99 mmol/L (ref 98–111)
Creatinine, Ser: 0.66 mg/dL (ref 0.61–1.24)
GFR, Estimated: >60 mL/min (ref >60)
Glucose, Bld: 96 mg/dL (ref 70–99)
Potassium: 4.2 mmol/L (ref 3.5–5.1)
Sodium: 134 mmol/L — ABNORMAL LOW (ref 135–145)
Total Bilirubin: 0.3 mg/dL (ref 0.0–1.2)
Total Protein: 7.4 g/dL (ref 6.5–8.1)

## 2024-05-03 LAB — CBC
HCT: 32.9 % — ABNORMAL LOW (ref 39.0–52.0)
Hemoglobin: 10.9 g/dL — ABNORMAL LOW (ref 13.0–17.0)
MCH: 38 pg — ABNORMAL HIGH (ref 26.0–34.0)
MCHC: 33.1 g/dL (ref 30.0–36.0)
MCV: 114.6 fL — ABNORMAL HIGH (ref 80.0–100.0)
Platelets: 230 K/uL (ref 150–400)
RBC: 2.87 MIL/uL — ABNORMAL LOW (ref 4.22–5.81)
RDW: 12.9 % (ref 11.5–15.5)
WBC: 9.1 K/uL (ref 4.0–10.5)
nRBC: 0 % (ref 0.0–0.2)

## 2024-05-03 NOTE — Progress Notes (Signed)
 Patient seen and examined at bedside today.  Comfortable.  No new complaints.  Freely ambulatory.  He is still waiting for SNF bed.  He is feeling frustrated being in the hospital for so long.  He says if he does not get a SNF bed  tomorrow, he wants to go home.  Discharge orders and summary are in place.  No new change in medical management

## 2024-05-03 NOTE — Plan of Care (Signed)
   Problem: Education: Goal: Knowledge of General Education information will improve Description: Including pain rating scale, medication(s)/side effects and non-pharmacologic comfort measures Outcome: Progressing   Problem: Health Behavior/Discharge Planning: Goal: Ability to manage health-related needs will improve Outcome: Progressing   Problem: Activity: Goal: Risk for activity intolerance will decrease Outcome: Progressing

## 2024-05-03 NOTE — Plan of Care (Signed)
  Problem: Education: Goal: Knowledge of General Education information will improve Description: Including pain rating scale, medication(s)/side effects and non-pharmacologic comfort measures Outcome: Progressing   Problem: Health Behavior/Discharge Planning: Goal: Ability to manage health-related needs will improve Outcome: Progressing   Problem: Clinical Measurements: Goal: Ability to maintain clinical measurements within normal limits will improve Outcome: Progressing Goal: Will remain free from infection Outcome: Progressing Goal: Diagnostic test results will improve Outcome: Progressing Goal: Respiratory complications will improve Outcome: Progressing Goal: Cardiovascular complication will be avoided Outcome: Progressing   Problem: Activity: Goal: Risk for activity intolerance will decrease Outcome: Progressing   Problem: Nutrition: Goal: Adequate nutrition will be maintained Outcome: Progressing   Problem: Elimination: Goal: Will not experience complications related to bowel motility Outcome: Progressing Goal: Will not experience complications related to urinary retention Outcome: Progressing   Problem: Skin Integrity: Goal: Risk for impaired skin integrity will decrease Outcome: Progressing   Problem: Safety: Goal: Ability to remain free from injury will improve Outcome: Progressing

## 2024-05-04 ENCOUNTER — Ambulatory Visit: Payer: Self-pay

## 2024-05-04 ENCOUNTER — Ambulatory Visit

## 2024-05-04 MED ORDER — POLYVINYL ALCOHOL 1.4 % OP SOLN
1.0000 [drp] | OPHTHALMIC | Status: DC | PRN
  Administered 2024-05-04: 1 [drp] via OPHTHALMIC
  Filled 2024-05-04: qty 15

## 2024-05-04 NOTE — TOC Progression Note (Addendum)
 Transition of Care [facility]) - Progression Note    Patient Details  Name: David Townsend MRN: [id number] Date of Birth: 14-Jul-1960  Transition of Care [facility]) CM/SW Contact  Toy LITTIE Agar, RN Phone Number:[phone]  05/04/2024, 8:53 AM  Clinical Narrative:    CM has called Kristie with admissions at [facility]. There is no answer secure voicemail has been left.   1037 Cm spoke with Kristie from admissions at [facility]. Insurance shara has been approved. Patient can discharge to facility today. Patient and HCPOA are agreeable to d/c to [facility] today. MD updated awaiting d/c orders and summary.     Expected Discharge Plan: Skilled Nursing Facility Barriers to Discharge: Insurance Authorization               Expected Discharge Plan and Services In-house Referral: NA Discharge Planning Services: CM Consult Post Acute Care Choice: NA Living arrangements for the past 2 months: Single Family Home                 DME Arranged: N/A DME Agency: NA       HH Arranged: NA HH Agency: NA         Social Drivers of Health (SDOH) Interventions SDOH Screenings   Food Insecurity: No Food Insecurity (04/13/2024)  Housing: Low Risk  (04/13/2024)  Transportation Needs: No Transportation Needs (04/13/2024)  Utilities: Not At Risk (04/13/2024)  Alcohol Screen: Medium Risk (12/04/2023)  Depression (PHQ2-9): Low Risk  (12/04/2023)  Tobacco Use: High Risk (04/13/2024)    Readmission Risk Interventions    04/16/2024    1:08 PM  Readmission Risk Prevention Plan  Transportation Screening Complete  Medication Review (RN Care Manager) Referral to Pharmacy  PCP or Specialist appointment within 3-5 days of discharge Complete  HRI or Home Care Consult Complete  SW Recovery Care/Counseling Consult Complete  Palliative Care Screening Not Applicable  Skilled Nursing Facility Complete

## 2024-05-04 NOTE — Progress Notes (Signed)
 Patient seen and examined at bedside today.  Hemodynamically stable.  Comfortable.  Sitting on the bed.  Alert and oriented.  No new complaints today.  Foley catheter in place.    He has a bed at skilled nursing facility today.  Medically stable for discharge today.  No new change in medical management

## 2024-05-04 NOTE — TOC Transition Note (Signed)
 Transition of Care [facility]) - Discharge Note   Patient Details  Name: David Townsend MRN: [id number] Date of Birth: [birth year]  Transition of Care [facility]) CM/SW Contact:  Toy LITTIE Agar, RN Phone [phone]  05/04/2024, 12:00 PM   Clinical Narrative:    CM has confirmed that patient has been cleared to admit to [facility] health and rehab. D/c summary has been faxed. Kristie in admissions gives clear for patient to admit to facility. Transportation has been arranged per PTAR. Nursing please call report to:  [facility] [phone] or [phone] and ask for [facility] nurse. D/c packet is at nurses station in chart. HCPOA [name] has been updated. No other inpatient care manager needs noted.    Final next level of care: Skilled Nursing Facility Barriers to Discharge: Barriers Resolved   Patient Goals and CMS Choice Patient states their goals for this hospitalization and ongoing recovery are:: Patient states that he is ready to go to rehab to build his strength. CMS Medicare.gov Compare Post Acute Care list provided to:: Patient Choice offered to / list presented to : Patient, HC POA / Guardian [place] ownership interest in [facility].provided to:: Patient    Discharge Placement   Existing PASRR number confirmed : 05/04/24          Patient chooses bed at:  [facility]) Patient to be transferred to facility by: PTAR Name of family member notified: [name] [facility] Patient and family notified of of transfer: 05/04/24  Discharge Plan and Services Additional resources added to the After Visit Summary for   In-house Referral: NA Discharge Planning Services: CM Consult Post Acute Care Choice: NA          DME Arranged: N/A DME Agency: NA       HH Arranged: NA HH Agency: NA        Social Drivers of Health (SDOH) Interventions SDOH Screenings   Food Insecurity: No Food Insecurity (04/13/2024)  Housing: Low Risk  (04/13/2024)   Transportation Needs: No Transportation Needs (04/13/2024)  Utilities: Not At Risk (04/13/2024)  Alcohol Screen: Medium Risk (12/04/2023)  Depression (PHQ2-9): Low Risk  (12/04/2023)  Tobacco Use: High Risk (04/13/2024)     Readmission Risk Interventions    04/16/2024    1:08 PM  Readmission Risk Prevention Plan  Transportation Screening Complete  Medication Review (RN Care Manager) Referral to Pharmacy  PCP or Specialist appointment within 3-5 days of discharge Complete  HRI or Home Care Consult Complete  SW Recovery Care/Counseling Consult Complete  Palliative Care Screening Not Applicable  Skilled Nursing Facility Complete

## 2024-05-04 NOTE — Plan of Care (Signed)
  Problem: Education: Goal: Knowledge of General Education information will improve Description: Including pain rating scale, medication(s)/side effects and non-pharmacologic comfort measures Outcome: Progressing   Problem: Health Behavior/Discharge Planning: Goal: Ability to manage health-related needs will improve Outcome: Progressing   Problem: Clinical Measurements: Goal: Will remain free from infection Outcome: Progressing Goal: Respiratory complications will improve Outcome: Progressing   Problem: Nutrition: Goal: Adequate nutrition will be maintained Outcome: Progressing   Problem: Coping: Goal: Level of anxiety will decrease Outcome: Progressing   Problem: Pain Managment: Goal: General experience of comfort will improve and/or be controlled Outcome: Progressing

## 2024-05-04 NOTE — Plan of Care (Signed)
 PMT no charge note  TOC note reviewed Discharge summary and discharge medication list noted.  No new inpatient PMT recommendations at this time Awaiting discharge Recommend outpatient palliative/recommend palliative support at the cancer center.  No charge. Lonia Serve MD [facility] palliative.

## 2024-05-05 ENCOUNTER — Ambulatory Visit: Payer: Self-pay

## 2024-05-05 ENCOUNTER — Ambulatory Visit: Admitting: Family

## 2024-05-11 ENCOUNTER — Telehealth: Payer: Self-pay

## 2024-05-11 ENCOUNTER — Other Ambulatory Visit: Payer: Self-pay | Admitting: Family

## 2024-05-11 ENCOUNTER — Other Ambulatory Visit: Payer: Self-pay

## 2024-05-11 ENCOUNTER — Encounter: Payer: Self-pay | Admitting: Family

## 2024-05-11 ENCOUNTER — Telehealth: Payer: Self-pay | Admitting: Emergency Medicine

## 2024-05-11 DIAGNOSIS — E569 Vitamin deficiency, unspecified: Secondary | ICD-10-CM

## 2024-05-11 DIAGNOSIS — R609 Edema, unspecified: Secondary | ICD-10-CM

## 2024-05-11 DIAGNOSIS — G893 Neoplasm related pain (acute) (chronic): Secondary | ICD-10-CM

## 2024-05-11 DIAGNOSIS — K5900 Constipation, unspecified: Secondary | ICD-10-CM

## 2024-05-11 DIAGNOSIS — R52 Pain, unspecified: Secondary | ICD-10-CM

## 2024-05-11 DIAGNOSIS — C61 Malignant neoplasm of prostate: Secondary | ICD-10-CM

## 2024-05-11 DIAGNOSIS — I10 Essential (primary) hypertension: Secondary | ICD-10-CM

## 2024-05-11 MED ORDER — POLYETHYLENE GLYCOL 3350 17 G PO PACK: 17.0000 g | PACK | Freq: Two times a day (BID) | ORAL | 0 refills | Status: AC

## 2024-05-11 MED ORDER — VALSARTAN 40 MG PO TABS: 40.0000 mg | ORAL_TABLET | Freq: Every day | ORAL | 0 refills | Status: DC

## 2024-05-11 MED ORDER — TYLENOL 8 HOUR ARTHRITIS PAIN 650 MG PO TBCR: 1300.0000 mg | EXTENDED_RELEASE_TABLET | Freq: Two times a day (BID) | ORAL | 0 refills | Status: AC | PRN

## 2024-05-11 MED ORDER — MULTI-VITAMIN/MINERALS PO TABS: 1.0000 | ORAL_TABLET | Freq: Every day | ORAL | 0 refills | Status: AC

## 2024-05-11 MED ORDER — MORPHINE SULFATE 15 MG PO TABS: 15.0000 mg | ORAL_TABLET | ORAL | 0 refills | Status: DC | PRN

## 2024-05-11 MED ORDER — METHOCARBAMOL 500 MG PO TABS: 500.0000 mg | ORAL_TABLET | Freq: Three times a day (TID) | ORAL | 1 refills | Status: AC | PRN

## 2024-05-11 MED ORDER — THIAMINE HCL 100 MG PO TABS: 100.0000 mg | ORAL_TABLET | Freq: Every day | ORAL | 0 refills | Status: AC

## 2024-05-11 MED ORDER — LIDOCAINE 5 % EX PTCH: 1.0000 | MEDICATED_PATCH | CUTANEOUS | 0 refills | Status: AC

## 2024-05-11 MED ORDER — IBUPROFEN 800 MG PO TABS: 800.0000 mg | ORAL_TABLET | Freq: Three times a day (TID) | ORAL | 1 refills | Status: AC | PRN

## 2024-05-11 MED ORDER — FUROSEMIDE 20 MG PO TABS: 20.0000 mg | ORAL_TABLET | Freq: Every day | ORAL | 0 refills | Status: DC

## 2024-05-11 MED ORDER — AMLODIPINE BESYLATE 10 MG PO TABS: 10.0000 mg | ORAL_TABLET | Freq: Every day | ORAL | 0 refills | Status: DC

## 2024-05-11 MED ORDER — DEXAMETHASONE 2 MG PO TABS: ORAL_TABLET | ORAL | 0 refills | Status: DC

## 2024-05-11 MED ORDER — MORPHINE SULFATE ER 60 MG PO TBCR: 60.0000 mg | EXTENDED_RELEASE_TABLET | Freq: Two times a day (BID) | ORAL | 0 refills | Status: DC

## 2024-05-11 MED ORDER — FOLIC ACID 1 MG PO TABS: 1.0000 mg | ORAL_TABLET | Freq: Every day | ORAL | 0 refills | Status: AC

## 2024-05-11 MED ORDER — SENNOSIDES-DOCUSATE SODIUM 8.6-50 MG PO TABS: 2.0000 | ORAL_TABLET | Freq: Two times a day (BID) | ORAL | 0 refills | Status: AC

## 2024-05-11 NOTE — Progress Notes (Signed)
 Respond to patient request about pain medication refills. Refilled MS Contin , and morphine  IR per request.  Patient was seen inpatient, and supposed to follow-up with palliative care as outpatient. I don't see this in place. Palliative care referral placed.  Patient supposed to stop drinking to be safely treated with either chemotherapy, or Pluvicto if eligible.  To determine eligibility for Pluvicto, patient needs a PSMA PET scan.  Chemotherapy and Pluvicto both have potential toxicity to the liver and therefore alcohol  drinking is not recommended due to potential severe toxicity. PSMA PET ordered.  Vertell would you please help coordinate his care?  He needs follow-up PET scan, palliative care and see me a few days after PET scan, thank you.  Levon would you please see him for evaluation as well? Thank you

## 2024-05-11 NOTE — Telephone Encounter (Signed)
 Copied from CRM [id number]. Topic: Clinical - Medication Question >> May 11, 2024  1:32 PM David Townsend wrote: Reason for RMF:Xmpduj P.O.A calling - patient was hospitalized for four weeks and all his medications were packed in with his clothes, the bag disappeared and  now he has no meds- [phone]- he has some ankle swelling going on   I already sent message to pcp waiting on a responds

## 2024-05-11 NOTE — Telephone Encounter (Signed)
-   Complete.  - Request Erleada  from established Oncology.  - I am unable to prescribe Morphine  per [facility] office policy.  - Report to the Emergency Department/Urgent Care/call 911 for immediate medical evaluation.

## 2024-05-12 NOTE — Telephone Encounter (Signed)
 Spoke with the patient made him aware that an RX for pain medication had been sent to the pharmacy by Dr Tina but that he needs to make an appointment with Palliative care for future pain management. We also discussed the need to stop drinking completely if he wants to start treatment. Patient was able to verbalize understanding of all of these instructions. Arland Legions BSN RN

## 2024-05-13 ENCOUNTER — Telehealth: Payer: Self-pay

## 2024-05-13 NOTE — Telephone Encounter (Signed)
 Pt call transferred to the RN. Education provided on what palliative care is, and the reason for the appointment. Pt verbalized understanding. Appt also r/s per pt request. Medication usage reviewed, pt verbalized understanding, no further needs at this time.

## 2024-05-13 NOTE — Progress Notes (Signed)
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 His treatment was delayed due to patient declining his treatment felt to be complications of encephalopathy, but ultimately he decided to complete his treatment.  Narrative: The patient tolerated radiotherapy but still had weakness at the completion of treatment which had been noted for about 2 months prior to presenting for care. He was treated with steroids and was given a taper instruction at the conclusion of his therapy. If he has additional areas of pain, Dr. Hughes has been aware of this pt and would consider osteocool/augmentation.    Plan:   The patient will receive a call in about one month from the radiation oncology department. He will continue follow up with Dr. Tina as well.      Donald KYM Husband, PAC

## 2024-05-13 NOTE — Telephone Encounter (Signed)
 I left voicemail for patient to return my call if appointment on 05/19/2024 does not work for him. I mailed an appointment reminder to address on file.

## 2024-05-19 ENCOUNTER — Inpatient Hospital Stay: Admitting: Family

## 2024-05-19 ENCOUNTER — Inpatient Hospital Stay: Attending: Nurse Practitioner | Admitting: Nurse Practitioner

## 2024-05-19 ENCOUNTER — Encounter: Payer: Self-pay | Admitting: Nurse Practitioner

## 2024-05-19 VITALS — BP 111/66 | HR 82 | Temp 97.3°F | Resp 19 | Ht 69.0 in | Wt 139.0 lb

## 2024-05-19 DIAGNOSIS — Z7189 Other specified counseling: Secondary | ICD-10-CM

## 2024-05-19 DIAGNOSIS — G893 Neoplasm related pain (acute) (chronic): Secondary | ICD-10-CM

## 2024-05-19 DIAGNOSIS — R530 Neoplastic (malignant) related fatigue: Secondary | ICD-10-CM

## 2024-05-19 DIAGNOSIS — C61 Malignant neoplasm of prostate: Secondary | ICD-10-CM

## 2024-05-19 DIAGNOSIS — K5903 Drug induced constipation: Secondary | ICD-10-CM

## 2024-05-19 DIAGNOSIS — M62838 Other muscle spasm: Secondary | ICD-10-CM

## 2024-05-19 DIAGNOSIS — Z515 Encounter for palliative care: Secondary | ICD-10-CM

## 2024-05-19 NOTE — Progress Notes (Signed)
 Palliative Medicine [facility]  [phone] [phone]) [phone]   Name: Zerick Prevette Date: 05/19/2024 MRN: [id number]  DOB: 1960-12-12  Patient Care Team: Jaycee Greig PARAS, NP as PCP - General (Nurse Practitioner)    REASON FOR CONSULTATION: Savaughn Karwowski is a 63 y.o. 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  Palliative is seeing patient for symptom management and goals of care.    SOCIAL HISTORY:     reports that he has been smoking cigarettes. He has a 68 pack-year smoking history. He has been exposed to tobacco smoke. He has never used smokeless tobacco. He reports current alcohol  use of about 7.0 standard drinks of alcohol  per week. He reports that he does not use drugs.  ADVANCE DIRECTIVES:  On file. Reviewed. Patient's documented HCPOA is [employer] and [name].   CODE STATUS: Full code  PAST MEDICAL HISTORY: Past Medical History:  Diagnosis Date   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   Essential hypertension 04/23/2022   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   Hyperlipidemia 03/27/2022   Penile lesion    Prostate cancer metastatic to bone (HCC) 04/23/2022   Urinary retention 04/23/2022    PAST SURGICAL HISTORY:  Past Surgical History:  Procedure Laterality Date   NO PAST SURGERIES     PENILE BIOPSY N/A 01/12/2020   Procedure: EXCISION OF PENILE LESION;  Surgeon: Elisabeth Valli BIRCH, MD;  Location: [facility] ;  Service: Urology;  Laterality: N/A;    HEMATOLOGY/ONCOLOGY HISTORY:  Oncology History  Prostate cancer (HCC)  04/23/2022 Initial Diagnosis   Prostate cancer metastatic to bone (HCC)   04/23/2022 Cancer Staging   Staging form: Prostate, AJCC 8th Edition - Clinical stage from 04/23/2022: Stage IVB (cTX, cNX, pM1b, PSA: 403) - Signed by Lonn Hicks, MD on  04/23/2022 Stage prefix: Initial diagnosis Prostate specific antigen (PSA) range: 20 or greater     ALLERGIES:  has no known allergies.  MEDICATIONS:  Current Outpatient Medications  Medication Sig Dispense Refill   amLODipine  (NORVASC ) 10 MG tablet Take 1 tablet (10 mg total) by mouth daily. 90 tablet 0   dexamethasone  (DECADRON ) 2 MG tablet Take 1 tablet (2 mg total) by mouth 2 (two) times daily for 5 days, THEN 1 tablet (2 mg total) daily for 7 days, THEN 1 tablet (2 mg total) every other day for 7 days. 22 tablet 0   ERLEADA  60 MG tablet Take 120-180 mg by mouth daily.     folic acid  (FOLVITE ) 1 MG tablet Take 1 tablet (1 mg total) by mouth daily. 90 tablet 0   furosemide  (LASIX ) 20 MG tablet Take 1 tablet (20 mg total) by mouth daily. 90 tablet 0   ibuprofen  (ADVIL ) 800 MG tablet Take 1 tablet (800 mg total) by mouth every 8 (eight) hours as needed for fever, mild pain (pain score 1-3) or headache (alternate with tylenol  for fever). 90 tablet 1   Iron , Ferrous Sulfate , 325 (65 Fe) MG TABS Take 325 mg by mouth daily. (Patient not taking: Reported on 04/13/2024) 90 tablet 0   lidocaine  (LIDODERM ) 5 % Place 1 patch onto the skin daily. Remove & Discard patch within 12 hours or as directed by MD 30 patch 0   methocarbamol  (ROBAXIN ) 500 MG tablet Take 1 tablet (500 mg total) by mouth every 8 (eight) hours as needed for muscle spasms. 90 tablet 1  morphine  (MS CONTIN ) 60 MG 12 hr tablet Take 1 tablet (60 mg total) by mouth every 12 (twelve) hours. 30 tablet 0   morphine  (MSIR) 15 MG tablet Take 1 tablet (15 mg total) by mouth every 4 (four) hours as needed for severe pain (pain score 7-10) or moderate pain (pain score 4-6). 30 tablet 0   Multiple Vitamin (MULTIVITAMIN WITH MINERALS) TABS tablet Take 1 tablet by mouth daily. (Patient not taking: Reported on 04/13/2024)     Multiple Vitamins-Minerals (MULTIVITAMIN WITH MINERALS) tablet Take 1 tablet by mouth daily. 90 tablet 0   polyethylene  glycol (MIRALAX  / GLYCOLAX ) 17 g packet Take 17 g by mouth 2 (two) times daily. 100 each 0   senna-docusate (SENOKOT-S) 8.6-50 MG tablet Take 2 tablets by mouth 2 (two) times daily. 360 tablet 0   thiamine  (VITAMIN B-1) 100 MG tablet Take 1 tablet (100 mg total) by mouth daily.     thiamine  (VITAMIN B1) 100 MG tablet Take 1 tablet (100 mg total) by mouth daily. 90 tablet 0   TYLENOL  8 HOUR ARTHRITIS PAIN 650 MG CR tablet Take 2 tablets (1,300 mg total) by mouth 2 (two) times daily as needed for pain. 90 tablet 0   valsartan  (DIOVAN ) 40 MG tablet Take 1 tablet (40 mg total) by mouth daily. 90 tablet 0   No current facility-administered medications for this visit.    VITAL SIGNS: BP 111/66 (BP Location: Left Arm, Patient Position: Sitting)   Pulse 82   Temp (!) 97.3 F (36.3 C) (Temporal)   Resp 19   Ht 5' 9 (1.753 m)   Wt 139 lb (63 kg)   SpO2 100%   BMI 20.53 kg/m  Filed Weights   05/19/24 1308  Weight: 139 lb (63 kg)    Estimated body mass index is 20.53 kg/m as calculated from the following:   Height as of this encounter: 5' 9 (1.753 m).   Weight as of this encounter: 139 lb (63 kg).  LABS: CBC:    Component Value Date/Time   WBC 9.1 05/03/2024 0558   HGB 10.9 (L) 05/03/2024 0558   HGB 13.1 01/04/2023 1049   HCT 32.9 (L) 05/03/2024 0558   HCT 36.2 (L) 01/04/2023 1049   PLT 230 05/03/2024 0558   PLT 167 01/04/2023 1049   MCV 114.6 (H) 05/03/2024 0558   MCV 109 (H) 01/04/2023 1049   NEUTROABS 12.7 (H) 04/13/2024 1217   LYMPHSABS 0.6 (L) 04/13/2024 1217   MONOABS 1.2 (H) 04/13/2024 1217   EOSABS 0.0 04/13/2024 1217   BASOSABS 0.0 04/13/2024 1217   Comprehensive Metabolic Panel:    Component Value Date/Time   NA 134 (L) 05/03/2024 0558   NA 139 01/04/2023 1049   K 4.2 05/03/2024 0558   CL 99 05/03/2024 0558   CO2 24 05/03/2024 0558   BUN 16 05/03/2024 0558   BUN 13 01/04/2023 1049   CREATININE 0.66 05/03/2024 0558   CREATININE 0.80 04/23/2022 0959    CREATININE 1.24 10/29/2015 1339   GLUCOSE 96 05/03/2024 0558   CALCIUM  10.0 05/03/2024 0558   AST 20 05/03/2024 0558   AST 21 04/23/2022 0959   ALT 23 05/03/2024 0558   ALT 13 04/23/2022 0959   ALKPHOS 167 (H) 05/03/2024 0558   BILITOT 0.3 05/03/2024 0558   BILITOT 0.8 01/04/2023 1049   BILITOT 0.4 04/23/2022 0959   PROT 7.4 05/03/2024 0558   PROT 6.5 01/04/2023 1049   ALBUMIN 4.4 05/03/2024 0558   ALBUMIN 4.3 01/04/2023  1049    RADIOGRAPHIC STUDIES: No results found.  PERFORMANCE STATUS (ECOG) : 2 - Symptomatic, <50% confined to bed  Review of Systems  Constitutional:  Positive for activity change and fatigue.  Musculoskeletal:  Positive for arthralgias and gait problem.  Unless otherwise noted, a complete review of systems is negative.  Physical Exam General: NAD Cardiovascular: regular rate and rhythm Pulmonary: clear ant fields Abdomen: soft, nontender, + bowel sounds Extremities: no edema Skin: no rashes Neurological: Alert and oriented x3  Discussed the use of AI scribe software for clinical note transcription with the patient, who gave verbal consent to proceed.  History of Present Illness Capri Raben is a 63 year old male with metastatic prostate cancer with bone involvement who presents to clinic for his initial outpatient palliative visit. He was seen during recent hospitalization by our inpatient team. Patient is accompanied by his close friend Lonni. He is ambulatory with use of a golf club as his cane. He is in a wheelchair today due to complaints of left hip/leg pain. Alert and able to engage appropriately in discussions.   I introduced myself, Maygan RN, and Palliative's role in collaboration with the oncology team. Concept of Palliative Care was introduced as specialized medical care for people and their families living with serious illness.  It focuses on providing relief from the symptoms and stress of a serious illness.  The goal is to improve  quality of life for both the patient and the family. Values and goals of care important to patient and family were attempted to be elicited.  Mr. Pellerito lives in the home alone. He and his significant other, [name] have been together for over a year. His good friend Lonni assist wit appointments etc. He is a former Journalist, [employer]. No children and never married.   At home he is able to perform most ADLs independently however with some limitations. He was recently discharged from a rehabilitation facility 10 days ago after a five-week hospital stay. Initially, his pain was well-managed, and he was able to move around without his cane. However, the pain has returned, and he suspects it may be due to increased activity since returning home.  He has been experiencing constipation, noting he has not had a bowel movement in three days, which he attributes to his pain medication. Education provided on use of stool softeners to prevent constipation related to opioid use. He confirms that he has Miralax  on hand at home. Recommended taking 1-2 times daily consistently. He verbalized understanding.   Patient reports difficulty sleeping initially after returning from rehab but has since found comfort sleeping on the couch, allowing him to sleep in chunks throughout the night. He occasionally wakes up and engages in activities around the house before returning to sleep.  His appetite is good, and he has been eating well, although he lost about 12 pounds during his hospital stay. His current weight is 139 pounds, down from over 150 pounds prior to hospitalization. He is able to perform daily activities such as grocery shopping and cleaning, although he acknowledges his energy levels are not as high as before his hospitalization.  We discussed Mr. Geddis's pain at length. Complete medication, chart, and social history review completed. Patient denies any alcohol  or illicit drug use. Extensive discussions and  explanation of palliative's role in collaboration with his oncology team to assist in patient's pain and symptom management. Education provided on criteria for ongoing pain support via palliative team.   Axon reports he  has been experiencing excruciating pain in his left leg and back since yesterday, which is severe enough to make it difficult for him to stand up from a chair. He takes morphine  60 mg every 12 hours and an immediate-release morphine  as needed, which reduces his pain from an 11 to a 5. He also uses ibuprofen  800 mg every 8 hours and Robaxin  (methocarbamol ) as needed for muscle relaxation. He has not required around the clock use of MSIR. Patient has his pill bottles with him today. Pill count completed. He his taking as directed based on quantity on hand. He feels current regimen has been effective. No adjustments at this time.   We discussed refill protocol including 3 day notice for refills. He and family verbalized understanding. We will continue to closely monitor and support. I will give him a call next week to assess new left hip/leg pain with concern for cancer related pain.   All questions answered and support provided.   Goals of Care We discussed his current illness and what it means in the larger context of his on-going co-morbidities. Natural disease trajectory and expectations were discussed.  Mr. Dissinger has an upcoming PET Scan which I confirmed with him. He understands results will be reviewed by Oncologist and this can also show any concerns or etiology of left hip/leg pain if visible.   Patient has a documented advanced directives on file.  Full code for his CODE STATUS at this time.  His girlfriend [name] is his primary medical decision-maker with support of his close friend Lonni.  I discussed the importance of continued conversation with family and their medical providers regarding overall plan of care and treatment options, ensuring decisions are within the  context of the patients values and GOCs.  Assessment & Plan Established therapeutic relationship. Education provided on palliative's role in collaboration with their Oncology/Radiation team.  Cancer related pain Recent exacerbation of left leg pain, likely due to overactivity post-rehabilitation. Pain rated as 11/10 at worst, reduced to 5/10 with medication. Current regimen includes MS Contin  60 mg every 12 hours and MS Contin  15 mg every 4 hours as needed. Recent increase in activity may have contributed to pain exacerbation.  No adjustments at this time.  Will continue to closely follow and monitor new left leg pain. - Continue MS Contin  60 mg every 12 hours. - Continue MS Contin  15 mg every 4 hours as needed for breakthrough pain. -  Robaxin  (methocarbamol ) 500 mg as needed for muscle spasms. - Perform exercises to strengthen muscles and improve mobility, including leg lifts and heel slides. - Use ice and heat therapy to manage inflammation and pain. - Take breaks during activities to prevent overexertion.  Constipation Likely secondary to opioid use. No bowel movement in the past three days. Previous use of Miralax  was ineffective. - Take Miralax  daily to manage constipation.  If no improvement increase to twice daily.  Follow-up I will plan to follow-up with patient by phone in 1 week and in the clinic in 2 to 3 weeks.  Patient expressed understanding and was in agreement with this plan. He also understands that He can call the clinic at any time with any questions, concerns, or complaints.   Thank you for your referral and allowing Palliative to assist in Mr. Brodric Schauer care.   Number and complexity of problems addressed: HIGH - 1 or more chronic illnesses with SEVERE exacerbation, progression, or side effects of treatment - advanced cancer, pain. Any controlled substances utilized were  prescribed in the context of palliative care.  Visit consisted of counseling and education  dealing with the complex and emotionally intense issues of symptom management and palliative care in the setting of serious and potentially life-threatening illness.  Signed by: Levon Borer, AGPCNP-BC Palliative Medicine Team/[facility]

## 2024-05-20 ENCOUNTER — Telehealth: Payer: Self-pay

## 2024-05-20 NOTE — Telephone Encounter (Signed)
 This RN returned V/M left by Mr. Chiou regarding dexamethasone  refill request. This RN made Pt aware that per Levon NP dexamethasone  prescription was a taper dose and will not be refilled.  Pt informed this RN that he was going to try alternating heat and ice for his back, hip and leg pain, and that he wanted [facility] NP to know the Robaxin  was helping. This RN reassured Pt that she would notify [facility] NP and educated Pt to complete the course of the oral steroid. This RN re-educated Pt to alternate heat and ice in 20 minutes increments, how to take Robaxin , and to call us  back for concerns or changes. Pt verbalized understanding and was agreeable with information provided and plan.

## 2024-05-21 ENCOUNTER — Telehealth: Payer: Self-pay

## 2024-05-21 NOTE — Telephone Encounter (Signed)
 Pt called reporting that he was experiencing increased pain. Pt LVM this AM reporting increased pain, at time of call with pt he reports that his pain has decreased from a 10 to about an 8/10 when up ambulating. At rest pain is a 1/10. RN reviewed medication with pt and how to safely take them. Pt taking MS Contin  and MSIR as prescribed but is only taking robaxin  2x a day. RN recommended taking it q8hrs per script, pt verbalized understanding. Pt reports that he did too much and is paying for it. RN also reeducated on resting and using hot/cold packs to help as well. Pt verbalized understanding and agreement with plan,no further needs at this time.

## 2024-05-22 ENCOUNTER — Emergency Department (HOSPITAL_COMMUNITY)

## 2024-05-22 ENCOUNTER — Inpatient Hospital Stay (HOSPITAL_COMMUNITY)
Admission: EM | Admit: 2024-05-22 | Discharge: 2024-05-27 | DRG: 543 | Disposition: A | Discharge status: Skilled Nursing Facility | Attending: Internal Medicine | Admitting: Internal Medicine

## 2024-05-22 ENCOUNTER — Encounter (HOSPITAL_COMMUNITY): Payer: Self-pay

## 2024-05-22 ENCOUNTER — Encounter (HOSPITAL_COMMUNITY)

## 2024-05-22 ENCOUNTER — Other Ambulatory Visit: Payer: Self-pay

## 2024-05-22 DIAGNOSIS — M4856XA Collapsed vertebra, not elsewhere classified, lumbar region, initial encounter for fracture: Principal | ICD-10-CM | POA: Diagnosis present

## 2024-05-22 DIAGNOSIS — I10 Essential (primary) hypertension: Secondary | ICD-10-CM | POA: Diagnosis present

## 2024-05-22 DIAGNOSIS — R748 Abnormal levels of other serum enzymes: Secondary | ICD-10-CM | POA: Diagnosis present

## 2024-05-22 DIAGNOSIS — F1010 Alcohol abuse, uncomplicated: Secondary | ICD-10-CM | POA: Diagnosis present

## 2024-05-22 DIAGNOSIS — D630 Anemia in neoplastic disease: Secondary | ICD-10-CM | POA: Diagnosis present

## 2024-05-22 DIAGNOSIS — W19XXXA Unspecified fall, initial encounter: Secondary | ICD-10-CM | POA: Diagnosis present

## 2024-05-22 DIAGNOSIS — D696 Thrombocytopenia, unspecified: Secondary | ICD-10-CM | POA: Diagnosis present

## 2024-05-22 DIAGNOSIS — Y92009 Unspecified place in unspecified non-institutional (private) residence as the place of occurrence of the external cause: Secondary | ICD-10-CM | POA: Diagnosis not present

## 2024-05-22 DIAGNOSIS — F17210 Nicotine dependence, cigarettes, uncomplicated: Secondary | ICD-10-CM | POA: Diagnosis present

## 2024-05-22 DIAGNOSIS — M4854XA Collapsed vertebra, not elsewhere classified, thoracic region, initial encounter for fracture: Secondary | ICD-10-CM | POA: Diagnosis present

## 2024-05-22 DIAGNOSIS — E871 Hypo-osmolality and hyponatremia: Secondary | ICD-10-CM | POA: Diagnosis present

## 2024-05-22 DIAGNOSIS — E876 Hypokalemia: Secondary | ICD-10-CM | POA: Diagnosis present

## 2024-05-22 DIAGNOSIS — S22000A Wedge compression fracture of unspecified thoracic vertebra, initial encounter for closed fracture: Secondary | ICD-10-CM

## 2024-05-22 DIAGNOSIS — Z79899 Other long term (current) drug therapy: Secondary | ICD-10-CM

## 2024-05-22 DIAGNOSIS — F10939 Alcohol use, unspecified with withdrawal, unspecified: Secondary | ICD-10-CM

## 2024-05-22 DIAGNOSIS — M8458XA Pathological fracture in neoplastic disease, other specified site, initial encounter for fracture: Principal | ICD-10-CM | POA: Diagnosis present

## 2024-05-22 DIAGNOSIS — M542 Cervicalgia: Secondary | ICD-10-CM | POA: Diagnosis present

## 2024-05-22 DIAGNOSIS — Z515 Encounter for palliative care: Secondary | ICD-10-CM

## 2024-05-22 DIAGNOSIS — K5903 Drug induced constipation: Secondary | ICD-10-CM | POA: Diagnosis present

## 2024-05-22 DIAGNOSIS — M25559 Pain in unspecified hip: Secondary | ICD-10-CM | POA: Diagnosis present

## 2024-05-22 DIAGNOSIS — S32000G Wedge compression fracture of unspecified lumbar vertebra, subsequent encounter for fracture with delayed healing: Secondary | ICD-10-CM | POA: Diagnosis not present

## 2024-05-22 DIAGNOSIS — E882 Lipomatosis, not elsewhere classified: Secondary | ICD-10-CM | POA: Diagnosis present

## 2024-05-22 DIAGNOSIS — N179 Acute kidney failure, unspecified: Secondary | ICD-10-CM | POA: Diagnosis present

## 2024-05-22 DIAGNOSIS — C61 Malignant neoplasm of prostate: Secondary | ICD-10-CM | POA: Diagnosis present

## 2024-05-22 DIAGNOSIS — M4850XA Collapsed vertebra, not elsewhere classified, site unspecified, initial encounter for fracture: Secondary | ICD-10-CM | POA: Diagnosis present

## 2024-05-22 DIAGNOSIS — G893 Neoplasm related pain (acute) (chronic): Secondary | ICD-10-CM | POA: Diagnosis not present

## 2024-05-22 DIAGNOSIS — Z7189 Other specified counseling: Secondary | ICD-10-CM | POA: Diagnosis not present

## 2024-05-22 DIAGNOSIS — E785 Hyperlipidemia, unspecified: Secondary | ICD-10-CM | POA: Diagnosis present

## 2024-05-22 DIAGNOSIS — S32000A Wedge compression fracture of unspecified lumbar vertebra, initial encounter for closed fracture: Secondary | ICD-10-CM

## 2024-05-22 DIAGNOSIS — D7589 Other specified diseases of blood and blood-forming organs: Secondary | ICD-10-CM | POA: Diagnosis present

## 2024-05-22 DIAGNOSIS — C7951 Secondary malignant neoplasm of bone: Secondary | ICD-10-CM | POA: Diagnosis present

## 2024-05-22 DIAGNOSIS — S22000S Wedge compression fracture of unspecified thoracic vertebra, sequela: Secondary | ICD-10-CM | POA: Diagnosis not present

## 2024-05-22 DIAGNOSIS — Z8549 Personal history of malignant neoplasm of other male genital organs: Secondary | ICD-10-CM

## 2024-05-22 DIAGNOSIS — N390 Urinary tract infection, site not specified: Secondary | ICD-10-CM | POA: Diagnosis present

## 2024-05-22 LAB — BASIC METABOLIC PANEL WITH GFR
Anion gap: 12 (ref 5–15)
BUN: 20 mg/dL (ref 8–23)
CO2: 23 mmol/L (ref 22–32)
Calcium: 8.3 mg/dL — ABNORMAL LOW (ref 8.9–10.3)
Chloride: 97 mmol/L — ABNORMAL LOW (ref 98–111)
Creatinine, Ser: 1.31 mg/dL — ABNORMAL HIGH (ref 0.61–1.24)
GFR, Estimated: >60 mL/min
Glucose, Bld: 86 mg/dL (ref 70–99)
Potassium: 3.1 mmol/L — ABNORMAL LOW (ref 3.5–5.1)
Sodium: 132 mmol/L — ABNORMAL LOW (ref 135–145)

## 2024-05-22 LAB — CBC WITH DIFFERENTIAL/PLATELET
Abs Immature Granulocytes: 0.08 K/uL — ABNORMAL HIGH (ref 0.00–0.07)
Basophils Absolute: 0 K/uL (ref 0.0–0.1)
Basophils Relative: 0 %
Eosinophils Absolute: 0 K/uL (ref 0.0–0.5)
Eosinophils Relative: 0 %
HCT: 26 % — ABNORMAL LOW (ref 39.0–52.0)
Hemoglobin: 8.9 g/dL — ABNORMAL LOW (ref 13.0–17.0)
Immature Granulocytes: 1 %
Lymphocytes Relative: 4 %
Lymphs Abs: 0.3 K/uL — ABNORMAL LOW (ref 0.7–4.0)
MCH: 38.2 pg — ABNORMAL HIGH (ref 26.0–34.0)
MCHC: 34.2 g/dL (ref 30.0–36.0)
MCV: 111.6 fL — ABNORMAL HIGH (ref 80.0–100.0)
Monocytes Absolute: 0.4 K/uL (ref 0.1–1.0)
Monocytes Relative: 5 %
Neutro Abs: 6.8 K/uL (ref 1.7–7.7)
Neutrophils Relative %: 90 %
Platelets: 115 K/uL — ABNORMAL LOW (ref 150–400)
RBC: 2.33 MIL/uL — ABNORMAL LOW (ref 4.22–5.81)
RDW: 13.7 % (ref 11.5–15.5)
WBC: 7.5 K/uL (ref 4.0–10.5)
nRBC: 0 % (ref 0.0–0.2)

## 2024-05-22 LAB — HEPATIC FUNCTION PANEL
ALT: 24 U/L (ref 0–44)
AST: 91 U/L — ABNORMAL HIGH (ref 15–41)
Albumin: 3.3 g/dL — ABNORMAL LOW (ref 3.5–5.0)
Alkaline Phosphatase: 172 U/L — ABNORMAL HIGH (ref 38–126)
Bilirubin, Direct: 0.1 mg/dL (ref 0.0–0.2)
Indirect Bilirubin: 0.1 mg/dL — ABNORMAL LOW (ref 0.3–0.9)
Total Bilirubin: 0.2 mg/dL (ref 0.0–1.2)
Total Protein: 5.4 g/dL — ABNORMAL LOW (ref 6.5–8.1)

## 2024-05-22 LAB — I-STAT CG4 LACTIC ACID, ED: Lactic Acid, Venous: 1.1 mmol/L (ref 0.5–1.9)

## 2024-05-22 LAB — PROTIME-INR
INR: 1 (ref 0.8–1.2)
Prothrombin Time: 13.7 s (ref 11.4–15.2)

## 2024-05-22 MED ORDER — ACETAMINOPHEN 650 MG RE SUPP: 650.0000 mg | Freq: Four times a day (QID) | RECTAL | Status: DC | PRN

## 2024-05-22 MED ORDER — MAGNESIUM CITRATE PO SOLN: 1.0000 | Freq: Once | ORAL | Status: DC | PRN

## 2024-05-22 MED ORDER — SODIUM CHLORIDE 0.9 % IV BOLUS: 500.0000 mL | Freq: Once | INTRAVENOUS | Status: DC

## 2024-05-22 MED ORDER — ONDANSETRON HCL 4 MG/2ML IJ SOLN: 4.0000 mg | Freq: Four times a day (QID) | INTRAMUSCULAR | Status: DC | PRN

## 2024-05-22 MED ORDER — IOHEXOL 300 MG/ML  SOLN
100.0000 mL | Freq: Once | INTRAMUSCULAR | Status: AC | PRN
  Administered 2024-05-22: 100 mL via INTRAVENOUS

## 2024-05-22 MED ORDER — METHYLNALTREXONE BROMIDE 12 MG/0.6ML ~~LOC~~ SOLN
12.0000 mg | Freq: Once | SUBCUTANEOUS | Status: AC
  Administered 2024-05-23: 12 mg via SUBCUTANEOUS
  Filled 2024-05-22: qty 0.6

## 2024-05-22 MED ORDER — LACTATED RINGERS IV SOLN: INTRAVENOUS | Status: DC

## 2024-05-22 MED ORDER — SODIUM CHLORIDE 0.9 % IV SOLN
500.0000 mg | Freq: Once | INTRAVENOUS | Status: AC
  Administered 2024-05-22: 500 mg via INTRAVENOUS
  Filled 2024-05-22: qty 5

## 2024-05-22 MED ORDER — POTASSIUM CHLORIDE 10 MEQ/100ML IV SOLN
10.0000 meq | INTRAVENOUS | Status: AC
  Administered 2024-05-23 (×3): 10 meq via INTRAVENOUS
  Filled 2024-05-22 (×3): qty 100

## 2024-05-22 MED ORDER — BISACODYL 5 MG PO TBEC
5.0000 mg | DELAYED_RELEASE_TABLET | Freq: Every day | ORAL | Status: DC | PRN
  Administered 2024-05-26: 5 mg via ORAL
  Filled 2024-05-22: qty 1

## 2024-05-22 MED ORDER — SODIUM CHLORIDE 0.9% FLUSH
3.0000 mL | Freq: Two times a day (BID) | INTRAVENOUS | Status: DC
  Administered 2024-05-22 – 2024-05-27 (×10): 3 mL via INTRAVENOUS

## 2024-05-22 MED ORDER — ACETAMINOPHEN 325 MG PO TABS: 650.0000 mg | ORAL_TABLET | Freq: Four times a day (QID) | ORAL | Status: DC | PRN

## 2024-05-22 MED ORDER — SODIUM CHLORIDE 0.9 % IV BOLUS
1000.0000 mL | Freq: Once | INTRAVENOUS | Status: AC
  Administered 2024-05-22: 1000 mL via INTRAVENOUS

## 2024-05-22 MED ORDER — ONDANSETRON HCL 4 MG PO TABS: 4.0000 mg | ORAL_TABLET | Freq: Four times a day (QID) | ORAL | Status: DC | PRN

## 2024-05-22 MED ORDER — SODIUM CHLORIDE 0.9 % IV SOLN
1.0000 g | Freq: Once | INTRAVENOUS | Status: AC
  Administered 2024-05-22: 1 g via INTRAVENOUS
  Filled 2024-05-22: qty 10

## 2024-05-22 NOTE — H&P (Incomplete)
 " History and Physical    Patient: David Townsend [name] DOB: 1960/07/03 DOA: 05/22/2024 DOS: the patient was seen and examined on 05/22/2024 PCP: Jaycee Greig PARAS, NP  Patient coming from: Home  Chief Complaint:  Chief Complaint  Patient presents with   Fall   Hip Pain   HPI: David Townsend is a 63 y.o. male with medical history significant of prostate cancer with widespread bone mets, htn, alcohol  abuse, and tobacco abuse who presents with severe pain in back after a fall.  The patient reports he was in his usual state of health and trying to sit on the sofa but missed it and ended up with his bottom on the hardwood floor.  He immediately had excruciating pain.  His girlfriend had to call EMS.   Because of the trauma the patient received several imaging studies in the emergency department. He had a CT scan of his C-spine which revealed multilevel degenerative changes without acute abnormality.  He had an x-ray of the pelvis which reveals a new left femoral intertrochanteric lesion worrisome for progressive metastatic disease.  His chest x-ray revealed no acute fracture or dislocation.      Review of Systems: As mentioned in the history of present illness. All other systems reviewed and are negative. Past Medical History:  Diagnosis Date   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   Essential hypertension 04/23/2022   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   Hyperlipidemia 03/27/2022   Penile lesion    Prostate cancer metastatic to bone (HCC) 04/23/2022   Urinary retention 04/23/2022   Past Surgical History:  Procedure Laterality Date   NO PAST SURGERIES     PENILE BIOPSY N/A 01/12/2020   Procedure: EXCISION OF PENILE LESION;  Surgeon: Elisabeth Valli BIRCH, MD;  Location: [facility];  Service: Urology;  Laterality: N/A;   Social History:  reports that he has been smoking cigarettes. He has a 68 pack-year smoking history. He has been exposed to tobacco  smoke. He has never used smokeless tobacco. He reports current alcohol  use of about 7.0 standard drinks of alcohol  per week. He reports that he does not use drugs.  Allergies[1]  Family History  Family history unknown: Yes    Prior to Admission medications  Medication Sig Start Date End Date Taking? Authorizing Provider  amLODipine  (NORVASC ) 10 MG tablet Take 1 tablet (10 mg total) by mouth daily. 05/11/24   Jaycee Greig PARAS, NP  dexamethasone  (DECADRON ) 2 MG tablet Take 1 tablet (2 mg total) by mouth 2 (two) times daily for 5 days, THEN 1 tablet (2 mg total) daily for 7 days, THEN 1 tablet (2 mg total) every other day for 7 days. 05/11/24 05/30/24  Jaycee Greig PARAS, NP  ERLEADA  60 MG tablet Take 120-180 mg by mouth daily. 02/20/22   [provider]  folic acid  (FOLVITE ) 1 MG tablet Take 1 tablet (1 mg total) by mouth daily. 05/11/24   Jaycee Greig PARAS, NP  furosemide  (LASIX ) 20 MG tablet Take 1 tablet (20 mg total) by mouth daily. 05/11/24 06/10/24  Jaycee Greig PARAS, NP  ibuprofen  (ADVIL ) 800 MG tablet Take 1 tablet (800 mg total) by mouth every 8 (eight) hours as needed for fever, mild pain (pain score 1-3) or headache (alternate with tylenol  for fever). 05/11/24   Jaycee Greig PARAS, NP  Iron , Ferrous Sulfate , 325 (65 Fe) MG TABS Take 325 mg by mouth daily. Patient not taking: Reported on 04/13/2024 11/27/23   Jaycee Greig PARAS, NP  lidocaine  (LIDODERM ) 5 % Place 1 patch onto the skin daily. Remove & Discard patch within 12 hours or as directed by MD 05/11/24   Jaycee Greig PARAS, NP  methocarbamol  (ROBAXIN ) 500 MG tablet Take 1 tablet (500 mg total) by mouth every 8 (eight) hours as needed for muscle spasms. 05/11/24   Jaycee Greig PARAS, NP  morphine  (MS CONTIN ) 60 MG 12 hr tablet Take 1 tablet (60 mg total) by mouth every 12 (twelve) hours. 05/11/24   Tina Pauletta BROCKS, MD  morphine  (MSIR) 15 MG tablet Take 1 tablet (15 mg total) by mouth every 4 (four) hours as needed for severe pain (pain score 7-10) or moderate pain (pain  score 4-6). 05/11/24   Tina Pauletta BROCKS, MD  Multiple Vitamin (MULTIVITAMIN WITH MINERALS) TABS tablet Take 1 tablet by mouth daily. Patient not taking: Reported on 04/13/2024 10/04/22   Jillian Buttery, MD  Multiple Vitamins-Minerals (MULTIVITAMIN WITH MINERALS) tablet Take 1 tablet by mouth daily. 05/11/24   Jaycee Greig PARAS, NP  polyethylene glycol (MIRALAX  / GLYCOLAX ) 17 g packet Take 17 g by mouth 2 (two) times daily. 05/11/24   Jaycee Greig PARAS, NP  senna-docusate (SENOKOT-S) 8.6-50 MG tablet Take 2 tablets by mouth 2 (two) times daily. 05/11/24   Jaycee Greig PARAS, NP  thiamine  (VITAMIN B-1) 100 MG tablet Take 1 tablet (100 mg total) by mouth daily. 04/23/24   Amin, Ankit C, MD  thiamine  (VITAMIN B1) 100 MG tablet Take 1 tablet (100 mg total) by mouth daily. 05/11/24   Jaycee Greig PARAS, NP  TYLENOL  8 HOUR ARTHRITIS PAIN 650 MG CR tablet Take 2 tablets (1,300 mg total) by mouth 2 (two) times daily as needed for pain. 05/11/24   Jaycee Greig PARAS, NP  valsartan  (DIOVAN ) 40 MG tablet Take 1 tablet (40 mg total) by mouth daily. 05/11/24   Jaycee Greig PARAS, NP    Physical Exam: Vitals:   05/22/24 1900 05/22/24 1930 05/22/24 1931 05/22/24 2100  BP: (!) 98/50 (!) 108/56    Pulse: 84 81    Resp: 16 12    Temp:    98.7 F (37.1 C)  TempSrc:      SpO2: 98% 98% 98%   Weight:      Height:       Physical Exam:  General: Chronically ill-appearing, disheveled HEENT: Normocephalic, atraumatic, PERRL Cardiovascular: Normal rate and rhythm. Distal pulses intact. Pulmonary: Normal pulmonary effort, normal breath sounds Gastrointestinal: Distended abdomen, full, non-tender, hypoactive bowel sounds Musculoskeletal:No lower ext edema Lymphadenopathy: No cervical LAD. Skin: Skin is warm and dry. Hyperemic areas, hyperpigmented areas Neuro: He is laying to the right in the cot and makes no attempt to straighten himself. He says he is in too much pain to move. AAOx3. PSYCH: Attentive and cooperative  Data Reviewed: {Tip this  will not be part of the note when signed- Document your independent interpretation of telemetry tracing, EKG, lab, Radiology test or any other diagnostic tests. Add any new diagnostic test ordered today. (Optional):[id number]} Results for orders placed or performed during the hospital encounter of 05/22/24 (from the past 24 hours)  Basic metabolic panel     Status: Abnormal   Collection Time: 05/22/24  6:25 PM  Result Value Ref Range   Sodium 132 (L) 135 - 145 mmol/L   Potassium 3.1 (L) 3.5 - 5.1 mmol/L   Chloride 97 (L) 98 - 111 mmol/L   CO2 23 22 - 32 mmol/L   Glucose, Bld 86 70 - 99 mg/dL  BUN 20 8 - 23 mg/dL   Creatinine, Ser 8.68 (H) 0.61 - 1.24 mg/dL   Calcium  8.3 (L) 8.9 - 10.3 mg/dL   GFR, Estimated >39 >39 mL/min   Anion gap 12 5 - 15  CBC with Differential     Status: Abnormal   Collection Time: 05/22/24  6:25 PM  Result Value Ref Range   WBC 7.5 4.0 - 10.5 K/uL   RBC 2.33 (L) 4.22 - 5.81 MIL/uL   Hemoglobin 8.9 (L) 13.0 - 17.0 g/dL   HCT 73.9 (L) 60.9 - 47.9 %   MCV 111.6 (H) 80.0 - 100.0 fL   MCH 38.2 (H) 26.0 - 34.0 pg   MCHC 34.2 30.0 - 36.0 g/dL   RDW 86.2 88.4 - 84.4 %   Platelets 115 (L) 150 - 400 K/uL   nRBC 0.0 0.0 - 0.2 %   Neutrophils Relative % 90 %   Neutro Abs 6.8 1.7 - 7.7 K/uL   Lymphocytes Relative 4 %   Lymphs Abs 0.3 (L) 0.7 - 4.0 K/uL   Monocytes Relative 5 %   Monocytes Absolute 0.4 0.1 - 1.0 K/uL   Eosinophils Relative 0 %   Eosinophils Absolute 0.0 0.0 - 0.5 K/uL   Basophils Relative 0 %   Basophils Absolute 0.0 0.0 - 0.1 K/uL   Immature Granulocytes 1 %   Abs Immature Granulocytes 0.08 (H) 0.00 - 0.07 K/uL  Protime-INR     Status: None   Collection Time: 05/22/24  6:25 PM  Result Value Ref Range   Prothrombin Time 13.7 11.4 - 15.2 seconds   INR 1.0 0.8 - 1.2  Hepatic function panel     Status: Abnormal   Collection Time: 05/22/24  6:25 PM  Result Value Ref Range   Total Protein 5.4 (L) 6.5 - 8.1 g/dL   Albumin 3.3 (L) 3.5 - 5.0 g/dL    AST 91 (H) 15 - 41 U/L   ALT 24 0 - 44 U/L   Alkaline Phosphatase 172 (H) 38 - 126 U/L   Total Bilirubin 0.2 0.0 - 1.2 mg/dL   Bilirubin, Direct 0.1 0.0 - 0.2 mg/dL   Indirect Bilirubin 0.1 (L) 0.3 - 0.9 mg/dL  I-Stat Lactic Acid     Status: None   Collection Time: 05/22/24  6:37 PM  Result Value Ref Range   Lactic Acid, Venous 1.1 0.5 - 1.9 mmol/L   CT T and L spine IMPRESSION: 1. Widespread lucent and sclerotic metastatic disease throughout the thoracic and lumbar spine. 2. Interim development of compression fractures at T10 and T12. Mild retropulsion of T12 vertebral body measuring about 3 mm. 3. Interim compression fractures at L1, inferior endplate of L3, and superior endplate of L4. Mild 2 mm retropulsion of the upper L1 vertebral body without significant canal stenosis. 4. Dorsal epidural lipomatosis with mass effect on the thecal sac/cord which are displaced anteriorly, this is most notable from T2-T3 through T8-T9 level.  Paraspinal and other soft tissues: Mild paravertebral edema at L1.   Assessment and Plan: Multiple vertebral compression fractures post fall in patient with prostate cancer with known bone mets. - Neurosurgery was called by the emergency department.  They recommended brace and stated he is not a candidate for surgery at this time. - Pain control.  As patient is already on MS Contin .  Will just increase his short acting pain medication for now and evaluate. - Consider palliative care involvement to help with symptom management -    2. Constipation -  patient has already tried MiraLAX  at home and it has not worked.  He thinks his last bowel movement was 3 days ago. - Go ahead and try Relistor since he has been unsuccessful with his MiraLAX .   3. AKI 4. Hypokalemia 5.Elevated alk phos and ALT 6. Anemia - Hgb= 8.9 was 10.9 3 weeks ago   Advance Care Planning:   Code Status: Prior ***  Consults: ***  Family Communication: ***  Severity of  Illness: {Observation/Inpatient:21159}  Author: ARTHEA CHILD, MD 05/22/2024 10:41 PM  For on call review [email or web address].        [1] No Known Allergies "

## 2024-05-22 NOTE — H&P (Incomplete)
 " History and Physical    Patient: David Townsend [name] DOB: Mar 14, 1961 DOA: 05/22/2024 DOS: the patient was seen and examined on 05/22/2024 PCP: Jaycee Greig PARAS, NP  Patient coming from: Home  Chief Complaint:  Chief Complaint  Patient presents with   Fall   Hip Pain   HPI: David Townsend is a 63 y.o. male with medical history significant of prostate cancer with widespread bone mets, htn, alcohol  abuse, and tobacco abuse who presents        Review of Systems: As mentioned in the history of present illness. All other systems reviewed and are negative. 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 He has a 68 pack-year smoking history. He has been exposed to tobacco smoke. He has never used smokeless tobacco. He reports current alcohol  use of about 7.0 standard drinks of alcohol  per week. He reports that he does not use drugs.  Allergies[1]  Family History  Family history unknown: Yes    Prior to Admission medications  Medication Sig Start Date End Date Taking? Authorizing Provider  amLODipine  (NORVASC ) 10 MG tablet Take 1 tablet (10 mg total) by mouth daily. 05/11/24   Jaycee Greig PARAS, NP  dexamethasone  (DECADRON ) 2 MG tablet Take 1 tablet (2 mg total) by mouth 2 (two) times daily for 5 days, THEN 1 tablet (2 mg total) daily for 7 days, THEN 1 tablet (2 mg total) every other day for 7 days. 05/11/24 05/30/24  Jaycee Greig PARAS, NP  ERLEADA  60 MG tablet Take 120-180 mg by mouth daily. 02/20/22   [provider]  folic acid  (FOLVITE ) 1 MG tablet Take 1 tablet (1 mg total) by mouth daily. 05/11/24   Jaycee Greig PARAS, NP  furosemide  (LASIX ) 20 MG tablet Take 1 tablet (20 mg total) by mouth daily. 05/11/24 06/10/24  Jaycee Greig PARAS, NP  ibuprofen  (ADVIL ) 800 MG tablet Take 1 tablet (800 mg total) by mouth every 8 (eight) hours as needed for fever, mild pain (pain score 1-3) or headache (alternate with tylenol  for fever). 05/11/24   Jaycee Greig PARAS, NP  Iron , Ferrous Sulfate , 325 (65 Fe) MG TABS Take 325 mg by mouth daily. Patient not taking: Reported on 04/13/2024 11/27/23   Massey, Amy J, NP  lidocaine  (LIDODERM ) 5 % Place 1 patch onto the skin daily. Remove & Discard patch within 12 hours or as directed by MD 05/11/24   Jaycee Greig PARAS, NP  methocarbamol  (ROBAXIN ) 500 MG tablet Take 1 tablet (500 mg total) by mouth every 8 (eight) hours as needed for muscle spasms. 05/11/24   Jaycee Greig PARAS, NP  morphine  (MS CONTIN ) 60 MG 12 hr tablet Take 1 tablet (60 mg total) by mouth every 12 (twelve) hours. 05/11/24   Tina Pauletta BROCKS, MD  morphine  (MSIR) 15 MG tablet Take 1 tablet (15 mg total) by mouth every 4 (four) hours as needed for severe pain (pain score 7-10)  or moderate pain (pain score 4-6). 05/11/24   Tina Pauletta BROCKS, MD  Multiple Vitamin (MULTIVITAMIN WITH MINERALS) TABS tablet Take 1 tablet by mouth daily. Patient not taking: Reported on 04/13/2024 10/04/22   Jillian Buttery, MD  Multiple Vitamins-Minerals (MULTIVITAMIN WITH MINERALS) tablet Take 1 tablet by mouth daily. 05/11/24   Jaycee Greig PARAS, NP  polyethylene glycol (MIRALAX  / GLYCOLAX ) 17 g packet Take 17 g by mouth 2 (two) times daily. 05/11/24   Jaycee Greig PARAS, NP  senna-docusate (SENOKOT-S) 8.6-50 MG tablet Take 2 tablets by mouth 2 (two) times daily. 05/11/24   Jaycee Greig PARAS, NP  thiamine  (VITAMIN B-1) 100 MG tablet Take 1 tablet (100 mg total) by mouth daily. 04/23/24   Amin,  Ankit C, MD  thiamine  (VITAMIN B1) 100 MG tablet Take 1 tablet (100 mg total) by mouth daily. 05/11/24   Jaycee Greig PARAS, NP  TYLENOL  8 HOUR ARTHRITIS PAIN 650 MG CR tablet Take 2 tablets (1,300 mg total) by mouth 2 (two) times daily as needed for pain. 05/11/24   Jaycee Greig PARAS, NP  valsartan  (DIOVAN ) 40 MG tablet Take 1 tablet (40 mg total) by mouth daily. 05/11/24   Jaycee Greig PARAS, NP    Physical Exam: Vitals:   05/22/24 1900 05/22/24 1930 05/22/24 1931 05/22/24 2100  BP: (!) 98/50 (!) 108/56    Pulse: 84 81    Resp: 16 12    Temp:    98.7 F (37.1 C)  TempSrc:      SpO2: 98% 98% 98%   Weight:      Height:       Physical Exam:  General: Chronically ill-appearing, disheveled HEENT: Normocephalic, atraumatic, PERRL Cardiovascular: Normal rate and rhythm. Distal pulses intact. Pulmonary: Normal pulmonary effort, normal breath sounds Gastrointestinal: Distended abdomen, full, non-tender, hypoactive bowel sounds Musculoskeletal:No lower ext edema Lymphadenopathy: No cervical LAD. Skin: Skin is warm and dry. Hyperemic areas, hyperpigmented areas Neuro: He is laying to the right in the cot and makes no attempt to straighten himself. He says he is in too much pain to move. AAOx3. PSYCH: Attentive and cooperative  Data Reviewed: {Tip this will not be part of the note when signed- Document your independent interpretation of telemetry tracing, EKG, lab, Radiology test or any other diagnostic tests. Add any new diagnostic test ordered today. (Optional):[id number]} Results for orders placed or performed during the hospital encounter of 05/22/24 (from the past 24 hours)  Basic metabolic panel     Status: Abnormal   Collection Time: 05/22/24  6:25 PM  Result Value Ref Range   Sodium 132 (L) 135 - 145 mmol/L   Potassium 3.1 (L) 3.5 - 5.1 mmol/L   Chloride 97 (L) 98 - 111 mmol/L   CO2 23 22 - 32 mmol/L   Glucose, Bld 86 70 - 99 mg/dL   BUN 20 8 - 23 mg/dL   Creatinine, Ser 8.68 (H) 0.61 - 1.24  mg/dL   Calcium  8.3 (L) 8.9 - 10.3 mg/dL   GFR, Estimated >39 >39 mL/min   Anion gap 12 5 - 15  CBC with Differential     Status: Abnormal   Collection Time: 05/22/24  6:25 PM  Result Value Ref Range   WBC 7.5 4.0 - 10.5 K/uL   RBC 2.33 (L) 4.22 - 5.81 MIL/uL   Hemoglobin 8.9 (L) 13.0 - 17.0 g/dL   HCT 73.9 (L) 60.9 - 47.9 %   MCV 111.6 (H) 80.0 - 100.0 fL   MCH 38.2 (H)  26.0 - 34.0 pg   MCHC 34.2 30.0 - 36.0 g/dL   RDW 86.2 88.4 - 84.4 %   Platelets 115 (L) 150 - 400 K/uL   nRBC 0.0 0.0 - 0.2 %   Neutrophils Relative % 90 %   Neutro Abs 6.8 1.7 - 7.7 K/uL   Lymphocytes Relative 4 %   Lymphs Abs 0.3 (L) 0.7 - 4.0 K/uL   Monocytes Relative 5 %   Monocytes Absolute 0.4 0.1 - 1.0 K/uL   Eosinophils Relative 0 %   Eosinophils Absolute 0.0 0.0 - 0.5 K/uL   Basophils Relative 0 %   Basophils Absolute 0.0 0.0 - 0.1 K/uL   Immature Granulocytes 1 %   Abs Immature Granulocytes 0.08 (H) 0.00 - 0.07 K/uL  Protime-INR     Status: None   Collection Time: 05/22/24  6:25 PM  Result Value Ref Range   Prothrombin Time 13.7 11.4 - 15.2 seconds   INR 1.0 0.8 - 1.2  Hepatic function panel     Status: Abnormal   Collection Time: 05/22/24  6:25 PM  Result Value Ref Range   Total Protein 5.4 (L) 6.5 - 8.1 g/dL   Albumin 3.3 (L) 3.5 - 5.0 g/dL   AST 91 (H) 15 - 41 U/L   ALT 24 0 - 44 U/L   Alkaline Phosphatase 172 (H) 38 - 126 U/L   Total Bilirubin 0.2 0.0 - 1.2 mg/dL   Bilirubin, Direct 0.1 0.0 - 0.2 mg/dL   Indirect Bilirubin 0.1 (L) 0.3 - 0.9 mg/dL  I-Stat Lactic Acid     Status: None   Collection Time: 05/22/24  6:37 PM  Result Value Ref Range   Lactic Acid, Venous 1.1 0.5 - 1.9 mmol/L   CT T and L spine IMPRESSION: 1. Widespread lucent and sclerotic metastatic disease throughout the thoracic and lumbar spine. 2. Interim development of compression fractures at T10 and T12. Mild retropulsion of T12 vertebral body measuring about 3 mm. 3. Interim compression fractures at L1,  inferior endplate of L3, and superior endplate of L4. Mild 2 mm retropulsion of the upper L1 vertebral body without significant canal stenosis. 4. Dorsal epidural lipomatosis with mass effect on the thecal sac/cord which are displaced anteriorly, this is most notable from T2-T3 through T8-T9 level.  Paraspinal and other soft tissues: Mild paravertebral edema at L1.   Assessment and Plan: Multiple vertebral compression fractures post fall in patient with prostate cancer with known bone mets. - Neurosurgery was called by the emergency department.  They recommended brace and stated he is not a candidate for surgery at this time. - Pain control.  As patient is already on MS Contin .  Will just increase his short acting pain medication for now and evaluate. - Consider palliative care involvement to help with symptom management -    2. Constipation -patient has already tried MiraLAX  at home and it has not worked.  He thinks his last bowel movement was 3 days ago. - Go ahead and try Relistor  since he has been unsuccessful with his MiraLAX .   3. AKI 4. Hypokalemia 5.Elevated alk phos and ALT 6. Anemia - Hgb= 8.9 was 10.9 3 weeks ago   Advance Care Planning:   Code Status: Prior ***  Consults: ***  Family Communication: ***  Severity of Illness: {Observation/Inpatient:21159}  Author: ARTHEA CHILD, MD 05/22/2024 10:41 PM  For on call review [email or web address].      [1] No Known Allergies  "

## 2024-05-22 NOTE — ED Triage Notes (Signed)
 BIB EMS from home, pt suffered mechanical fall landed on his bottom c/o pain to left hip after the fall. Denies LOC, denies hitting his head. No blood thinners. Per EMS pt has an obvious deformity to posterior hip (femoral neck), pelvis feels intact.  Hx Cancer   EMS Vitals: BP 92/44  90%RA 98% 2L

## 2024-05-22 NOTE — ED Provider Notes (Incomplete)
 " [facility] AT [facility] Provider Note   CSN: [id number] Arrival date & time: 05/22/24  1642     Patient presents with: Fall and Hip Pain   David Townsend is a 63 y.o. male.  He is brought in by EMS after a fall at home.  He said he was trying to sit on a loveseat and missed it and fell onto the ground.  Complaining of low back pain.  Denies hitting head or loss of consciousness.  He tells me he has a history of prostate cancer with mets to bone.  EMS was concerned he had left hip pain on exam.  Patient is mostly complaining of low back pain and some cervical pain.  He said these are somewhat chronic in nature but worse since the fall.  Denies feeling short of breath but is currently on oxygen with low saturations by EMS.  {Add pertinent medical, surgical, social history, OB history to YEP:67052} The history is provided by the patient.  Fall This is a new problem. The problem has not changed since onset.Pertinent negatives include no chest pain, no abdominal pain, no headaches and no shortness of breath. The symptoms are aggravated by bending and twisting. Nothing relieves the symptoms. He has tried nothing for the symptoms. The treatment provided no relief.       Prior to Admission medications  Medication Sig Start Date End Date Taking? Authorizing Provider  amLODipine  (NORVASC ) 10 MG tablet Take 1 tablet (10 mg total) by mouth daily. 05/11/24   Jaycee Greig PARAS, NP  dexamethasone  (DECADRON ) 2 MG tablet Take 1 tablet (2 mg total) by mouth 2 (two) times daily for 5 days, THEN 1 tablet (2 mg total) daily for 7 days, THEN 1 tablet (2 mg total) every other day for 7 days. 05/11/24 05/30/24  Jaycee Greig PARAS, NP  ERLEADA  60 MG tablet Take 120-180 mg by mouth daily. 02/20/22   [provider]  folic acid  (FOLVITE ) 1 MG tablet Take 1 tablet (1 mg total) by mouth daily. 05/11/24   Jaycee Greig PARAS, NP  furosemide  (LASIX ) 20 MG tablet Take 1 tablet (20 mg total) by mouth  daily. 05/11/24 06/10/24  Jaycee Greig PARAS, NP  ibuprofen  (ADVIL ) 800 MG tablet Take 1 tablet (800 mg total) by mouth every 8 (eight) hours as needed for fever, mild pain (pain score 1-3) or headache (alternate with tylenol  for fever). 05/11/24   Jaycee Greig PARAS, NP  Iron , Ferrous Sulfate , 325 (65 Fe) MG TABS Take 325 mg by mouth daily. Patient not taking: Reported on 04/13/2024 11/27/23   Jaycee Greig PARAS, NP  lidocaine  (LIDODERM ) 5 % Place 1 patch onto the skin daily. Remove & Discard patch within 12 hours or as directed by MD 05/11/24   Jaycee Greig PARAS, NP  methocarbamol  (ROBAXIN ) 500 MG tablet Take 1 tablet (500 mg total) by mouth every 8 (eight) hours as needed for muscle spasms. 05/11/24   Jaycee Greig PARAS, NP  morphine  (MS CONTIN ) 60 MG 12 hr tablet Take 1 tablet (60 mg total) by mouth every 12 (twelve) hours. 05/11/24   Tina Pauletta BROCKS, MD  morphine  (MSIR) 15 MG tablet Take 1 tablet (15 mg total) by mouth every 4 (four) hours as needed for severe pain (pain score 7-10) or moderate pain (pain score 4-6). 05/11/24   Tina Pauletta BROCKS, MD  Multiple Vitamin (MULTIVITAMIN WITH MINERALS) TABS tablet Take 1 tablet by mouth daily. Patient not taking: Reported on 04/13/2024 10/04/22  Jillian Buttery, MD  Multiple Vitamins-Minerals (MULTIVITAMIN WITH MINERALS) tablet Take 1 tablet by mouth daily. 05/11/24   Jaycee Greig PARAS, NP  polyethylene glycol (MIRALAX  / GLYCOLAX ) 17 g packet Take 17 g by mouth 2 (two) times daily. 05/11/24   Jaycee Greig PARAS, NP  senna-docusate (SENOKOT-S) 8.6-50 MG tablet Take 2 tablets by mouth 2 (two) times daily. 05/11/24   Jaycee Greig PARAS, NP  thiamine  (VITAMIN B-1) 100 MG tablet Take 1 tablet (100 mg total) by mouth daily. 04/23/24   Amin, Ankit C, MD  thiamine  (VITAMIN B1) 100 MG tablet Take 1 tablet (100 mg total) by mouth daily. 05/11/24   Jaycee Greig PARAS, NP  TYLENOL  8 HOUR ARTHRITIS PAIN 650 MG CR tablet Take 2 tablets (1,300 mg total) by mouth 2 (two) times daily as needed for pain. 05/11/24   Jaycee Greig PARAS,  NP  valsartan  (DIOVAN ) 40 MG tablet Take 1 tablet (40 mg total) by mouth daily. 05/11/24   Jaycee Greig PARAS, NP    Allergies: Patient has no known allergies.    Review of Systems  Constitutional:  Negative for fever.  Respiratory:  Negative for shortness of breath.   Cardiovascular:  Negative for chest pain.  Gastrointestinal:  Negative for abdominal pain.  Musculoskeletal:  Positive for back pain and neck pain.  Neurological:  Negative for headaches.    Updated Vital Signs BP (!) 90/54 (BP Location: Right Arm)   Pulse 81   Temp 98.5 F (36.9 C) (Oral)   Resp 12   Ht 5' 9 (1.753 m)   Wt 63 kg   SpO2 93%   BMI 20.51 kg/m   Physical Exam Vitals and nursing note reviewed.  Constitutional:      General: He is not in acute distress.    Appearance: Normal appearance. He is well-developed.  HENT:     Head: Normocephalic and atraumatic.  Eyes:     Conjunctiva/sclera: Conjunctivae normal.  Cardiovascular:     Rate and Rhythm: Normal rate and regular rhythm.     Heart sounds: No murmur heard. Pulmonary:     Effort: Pulmonary effort is normal. No respiratory distress.     Breath sounds: Normal breath sounds.  Abdominal:     Palpations: Abdomen is soft.     Tenderness: There is no abdominal tenderness.  Musculoskeletal:        General: Tenderness present. No deformity.     Cervical back: Neck supple.     Comments: He has no specific tenderness of his hips knees or ankles.  No upper extremity tenderness bilateral.  Does have some lumbar and paralumbar tenderness and some right sided cervical tenderness.  Skin:    General: Skin is warm and dry.     Capillary Refill: Capillary refill takes less than 2 seconds.  Neurological:     General: No focal deficit present.     Mental Status: He is alert and oriented to person, place, and time.     Motor: No weakness.     (all labs ordered are listed, but only abnormal results are displayed) Labs Reviewed - No data to  display  EKG: None  Radiology: No results found.  {Document cardiac monitor, telemetry assessment procedure when appropriate:32947} Procedures   Medications Ordered in the ED  sodium chloride  0.9 % bolus 500 mL (has no administration in time range)    Clinical Course as of 05/22/24 2201  Fri May 22, 2024  1757 Patient's chest and pelvis did not show any  obvious traumatic findings.  Awaiting radiology reading. [MB]  2118 Patient seen during coming back with possible pneumonia.  He also has significant osseous metastatic disease.  New compression fractures of lower thoracic and lumbar spine. [MB]  2200 Discussed with Dr. Arthea [facility] hospitalist who will evaluate patient for admission.  I updated patient and he is agreeable to plan for admission. [MB]  2200 Discussed with neurosurgery PA Bergman.  They recommend TLSO brace clamshell and outpatient follow-up with neurosurgery.  No operative intervention planned though. [MB]    Clinical Course User Index [MB] Towana Ozell BROCKS, MD   {Click here for ABCD2, HEART and other calculators REFRESH Note before signing:1}                              Medical Decision Making Amount and/or Complexity of Data Reviewed Labs: ordered. Radiology: ordered.  Risk Prescription drug management.   This patient complains of ***; this involves an extensive number of treatment Options and is a complaint that carries with it a high risk of complications and morbidity. The differential includes ***  I ordered, reviewed and interpreted labs, which included *** I ordered medication *** and reviewed PMP when indicated. I ordered imaging studies which included *** and I independently    visualized and interpreted imaging which showed *** Additional history obtained from *** Previous records obtained and reviewed *** I consulted *** and discussed lab and imaging findings and discussed disposition.  Cardiac monitoring reviewed, *** Social  determinants considered, *** Critical Interventions: ***  After the interventions stated above, I reevaluated the patient and found *** Admission and further testing considered, ***   {Document critical care time when appropriate  Document review of labs and clinical decision tools ie CHADS2VASC2, etc  Document your independent review of radiology images and any outside records  Document your discussion with family members, caretakers and with consultants  Document social determinants of health affecting pt's care  Document your decision making why or why not admission, treatments were needed:32947:::1}   Final diagnoses:  None    ED Discharge Orders     None        "

## 2024-05-23 DIAGNOSIS — S22000S Wedge compression fracture of unspecified thoracic vertebra, sequela: Secondary | ICD-10-CM | POA: Diagnosis not present

## 2024-05-23 DIAGNOSIS — W19XXXA Unspecified fall, initial encounter: Secondary | ICD-10-CM

## 2024-05-23 DIAGNOSIS — S32000G Wedge compression fracture of unspecified lumbar vertebra, subsequent encounter for fracture with delayed healing: Secondary | ICD-10-CM | POA: Diagnosis not present

## 2024-05-23 LAB — URINALYSIS, ROUTINE W REFLEX MICROSCOPIC
Bilirubin Urine: NEGATIVE
Glucose, UA: NEGATIVE mg/dL
Ketones, ur: NEGATIVE mg/dL
Nitrite: POSITIVE — AB
Protein, ur: 30 mg/dL — AB
Specific Gravity, Urine: 1.031 — ABNORMAL HIGH (ref 1.005–1.030)
WBC, UA: >50 WBC/hpf (ref 0–5)
pH: 6 (ref 5.0–8.0)

## 2024-05-23 LAB — CBC
HCT: 27.2 % — ABNORMAL LOW (ref 39.0–52.0)
Hemoglobin: 9.3 g/dL — ABNORMAL LOW (ref 13.0–17.0)
MCH: 38.1 pg — ABNORMAL HIGH (ref 26.0–34.0)
MCHC: 34.2 g/dL (ref 30.0–36.0)
MCV: 111.5 fL — ABNORMAL HIGH (ref 80.0–100.0)
Platelets: 106 K/uL — ABNORMAL LOW (ref 150–400)
RBC: 2.44 MIL/uL — ABNORMAL LOW (ref 4.22–5.81)
RDW: 13.7 % (ref 11.5–15.5)
WBC: 9.2 K/uL (ref 4.0–10.5)
nRBC: 0.2 % (ref 0.0–0.2)

## 2024-05-23 LAB — COMPREHENSIVE METABOLIC PANEL WITH GFR
ALT: 33 U/L (ref 0–44)
AST: 124 U/L — ABNORMAL HIGH (ref 15–41)
Albumin: 3.2 g/dL — ABNORMAL LOW (ref 3.5–5.0)
Alkaline Phosphatase: 174 U/L — ABNORMAL HIGH (ref 38–126)
Anion gap: 12 (ref 5–15)
BUN: 15 mg/dL (ref 8–23)
CO2: 23 mmol/L (ref 22–32)
Calcium: 8.6 mg/dL — ABNORMAL LOW (ref 8.9–10.3)
Chloride: 99 mmol/L (ref 98–111)
Creatinine, Ser: 0.86 mg/dL (ref 0.61–1.24)
GFR, Estimated: >60 mL/min
Glucose, Bld: 93 mg/dL (ref 70–99)
Potassium: 3.7 mmol/L (ref 3.5–5.1)
Sodium: 134 mmol/L — ABNORMAL LOW (ref 135–145)
Total Bilirubin: <0.2 mg/dL (ref 0.0–1.2)
Total Protein: 5.4 g/dL — ABNORMAL LOW (ref 6.5–8.1)

## 2024-05-23 LAB — MAGNESIUM: Magnesium: 2.1 mg/dL (ref 1.7–2.4)

## 2024-05-23 LAB — GLUCOSE, CAPILLARY: Glucose-Capillary: 106 mg/dL — ABNORMAL HIGH (ref 70–99)

## 2024-05-23 LAB — TYPE AND SCREEN
ABO/RH(D): A POS
Antibody Screen: NEGATIVE

## 2024-05-23 LAB — ABO/RH: ABO/RH(D): A POS

## 2024-05-23 MED ORDER — MORPHINE SULFATE 15 MG PO TABS
15.0000 mg | ORAL_TABLET | ORAL | Status: DC | PRN
  Administered 2024-05-23 – 2024-05-27 (×12): 15 mg via ORAL
  Filled 2024-05-23 (×12): qty 1

## 2024-05-23 MED ORDER — THIAMINE MONONITRATE 100 MG PO TABS
100.0000 mg | ORAL_TABLET | Freq: Every day | ORAL | Status: DC
  Administered 2024-05-23 – 2024-05-27 (×5): 100 mg via ORAL
  Filled 2024-05-23 (×5): qty 1

## 2024-05-23 MED ORDER — LACTATED RINGERS IV BOLUS
1000.0000 mL | Freq: Once | INTRAVENOUS | Status: AC
  Administered 2024-05-23: 1000 mL via INTRAVENOUS

## 2024-05-23 MED ORDER — FOLIC ACID 1 MG PO TABS
1.0000 mg | ORAL_TABLET | Freq: Every day | ORAL | Status: DC
  Administered 2024-05-23 – 2024-05-27 (×5): 1 mg via ORAL
  Filled 2024-05-23 (×5): qty 1

## 2024-05-23 MED ORDER — THIAMINE HCL 100 MG/ML IJ SOLN: 100.0000 mg | Freq: Every day | INTRAMUSCULAR | Status: DC

## 2024-05-23 MED ORDER — SODIUM CHLORIDE 0.9 % IV SOLN
1.0000 g | INTRAVENOUS | Status: DC
  Administered 2024-05-23 – 2024-05-27 (×5): 1 g via INTRAVENOUS
  Filled 2024-05-23 (×5): qty 10

## 2024-05-23 MED ORDER — MORPHINE SULFATE ER 15 MG PO TBCR
60.0000 mg | EXTENDED_RELEASE_TABLET | Freq: Two times a day (BID) | ORAL | Status: DC
  Administered 2024-05-23 – 2024-05-27 (×9): 60 mg via ORAL
  Filled 2024-05-23 (×9): qty 4

## 2024-05-23 MED ORDER — DEXAMETHASONE SOD PHOSPHATE PF 10 MG/ML IJ SOLN
10.0000 mg | Freq: Once | INTRAMUSCULAR | Status: AC
  Administered 2024-05-23: 10 mg via INTRAVENOUS

## 2024-05-23 MED ORDER — CHLORHEXIDINE GLUCONATE CLOTH 2 % EX PADS
6.0000 | MEDICATED_PAD | Freq: Every day | CUTANEOUS | Status: DC
  Administered 2024-05-24 – 2024-05-27 (×4): 6 via TOPICAL

## 2024-05-23 MED ORDER — LACTATED RINGERS IV SOLN: INTRAVENOUS | Status: DC

## 2024-05-23 MED ORDER — ADULT MULTIVITAMIN W/MINERALS CH
1.0000 | ORAL_TABLET | Freq: Every day | ORAL | Status: DC
  Administered 2024-05-23 – 2024-05-27 (×5): 1 via ORAL
  Filled 2024-05-23 (×5): qty 1

## 2024-05-23 NOTE — ED Notes (Signed)
 [facility] has arrived for pt , RN has medicated pt and sent off morning labs, [facility] informed RN that a new BB must be created at [facility] and reports that they will go ahead and take the pt. Pt a/o x4 at time of departure.

## 2024-05-23 NOTE — ED Notes (Signed)
 Patient needed to use the restroom and was wanting to trail getting out of bed. Writer grabbed wheelchair and assisted patient up from the bed. Patient had some difficulty sitting up initially but was able to tolerate it. Patient then was able to pivot with the wheelchair at bedside for transfer. Patient once in wheelchair and pushed into the room did need assistance with standing to get onto the toilet. Patient was not able to stand up directly in a full upright position but did ok with assistance for transfer.

## 2024-05-23 NOTE — Progress Notes (Signed)
 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  Patient was admitted with fall, with resultant multiple vertebral compression fracture and pain.  CT lumbar spine revealed widespread lucent and sclerotic metastatic disease throughout the thoracic and lumbar spine.  Interval development of compression fractures of T10 and T12, dorsal epidural lipomatosis with mass effect on the thecal sac/cord which are displaced anteriorly, most notable from T2-T3 through T8-T9 level.  As per neurosurgery team, patient is not a surgical candidate.  UA revealed large leukocyte esterase, positive nitrite and many bacteria.  Specific gravity was 1.031.  Serum creatinine of 1.21 on presentation, but down to 0.86.  05/23/2024: Continue to manage supportively.  Control pain.  Hydrate patient, managed supportively.  No new complaints today.  Assessment & Plan:   Principal Problem:   Vertebral compression fracture (HCC) Active Problems:   ETOH abuse   Anemia in neoplastic disease   Prostate cancer metastatic to bone (HCC)   Drug-induced constipation   Multiple vertebral compression fractures post fall in patient with prostate cancer with known bone mets. - Neurosurgery, PA Jennetta was called by the emergency department.  They recommended brace and stated he is not a candidate for surgery at this time. - Pain control.  As patient is already on MS Contin .  Will just increase his short acting pain medication for now and evaluate. - Consider palliative care involvement to help with symptom management - Awaiting a brace as recommended by neurosurgery - Consider a dose of Decadron  for mass effect on the thecal sac - Continue home pain  meds of MS contin  plus  MS IR for breakthrough 05/23/2024: Continue to manage supportively.  Adequate pain control.   Constipation: - Patient has been on opiates. - Continue MiraLAX  trial. - 1 dose of Relistor  given.   AKI: - Improved significantly. - Continue to monitor renal function and electrolytes. - Adequate hydration.   Hypokalemia: - Resolved significantly. - Potassium of 3.7.    Elevated alk phos and ALT: - Likely due to cancer with bone mets.    Anemia: -Hemoglobin of 9.3 g/dL today. - Continue to be monitored closely. - Continue to hold Lovenox   - Monitor - Guaiac stool  Thrombocytopenia: - Platelet count of 106. - Continue to monitor closely. -Etiology unclear. - Low threshold to check HIT antibody.  Possible UTI versus colonization: - Urine culture. -UA is positive for nitrite, large leukocyte esterase and many bacteria. - Follow urine culture result.     DVT prophylaxis: SCD. Code Status: Full code Family Communication:  Disposition Plan:    Consultants:  As per collateral information, and neurosurgery team has deemed patient not an ideal surgical candidate.  Procedures:  None.  Antimicrobials:  IV ceftriaxone .   Subjective: No complaints.  Objective: Vitals:   05/23/24 0420 05/23/24 0430 05/23/24 0500 05/23/24 0600  BP: 106/65 (!) 101/57 107/68 125/70  Pulse:  75 73 84  Resp: 14 11 14    Temp: 98.7 F (37.1 C)   98.3 F (36.8 C)  TempSrc:    Oral  SpO2:  95% 97% 97%  Weight:    62.3 kg  Height:    5' 10 (1.778 m)   No intake or output data in the 24 hours ending  05/23/24 0932 Filed Weights   05/22/24 1652 05/23/24 0600  Weight: 63 kg 62.3 kg    Examination:  General exam: Appears calm and comfortable  Respiratory system: Clear to auscultation.  Cardiovascular system: S1 & S2 heard Gastrointestinal system: Soft and nontender.   Central nervous system: Alert and oriented.    Data Reviewed: I have personally reviewed  following labs and imaging studies  CBC: Recent Labs  Lab 05/22/24 1825 05/23/24 0530  WBC 7.5 9.2  NEUTROABS 6.8  --   HGB 8.9* 9.3*  HCT 26.0* 27.2*  MCV 111.6* 111.5*  PLT 115* 106*   Basic Metabolic Panel: Recent Labs  Lab 05/22/24 1825 05/23/24 0530  NA 132* 134*  K 3.1* 3.7  CL 97* 99  CO2 23 23  GLUCOSE 86 93  BUN 20 15  CREATININE 1.31* 0.86  CALCIUM  8.3* 8.6*  MG  --  2.1   GFR: Estimated Creatinine Clearance: 77.5 mL/min (by C-G formula based on SCr of 0.86 mg/dL). Liver Function Tests: Recent Labs  Lab 05/22/24 1825 05/23/24 0530  AST 91* 124*  ALT 24 33  ALKPHOS 172* 174*  BILITOT 0.2 <0.2  PROT 5.4* 5.4*  ALBUMIN 3.3* 3.2*   No results for input(s): LIPASE, AMYLASE in the last 168 hours. No results for input(s): AMMONIA in the last 168 hours. Coagulation Profile: Recent Labs  Lab 05/22/24 1825  INR 1.0   Cardiac Enzymes: No results for input(s): CKTOTAL, CKMB, CKMBINDEX, TROPONINI in the last 168 hours. BNP (last 3 results) No results for input(s): PROBNP in the last 8760 hours. HbA1C: No results for input(s): HGBA1C in the last 72 hours. CBG: Recent Labs  Lab 05/23/24 0644  GLUCAP 106*   Lipid Profile: No results for input(s): CHOL, HDL, LDLCALC, TRIG, CHOLHDL, LDLDIRECT in the last 72 hours. Thyroid  Function Tests: No results for input(s): TSH, T4TOTAL, FREET4, T3FREE, THYROIDAB in the last 72 hours. Anemia Panel: No results for input(s): VITAMINB12, FOLATE, FERRITIN, TIBC, IRON , RETICCTPCT in the last 72 hours. Urine analysis:    Component Value Date/Time   COLORURINE YELLOW 05/22/2024 0057   APPEARANCEUR CLOUDY (A) 05/22/2024 0057   LABSPEC 1.031 (H) 05/22/2024 0057   PHURINE 6.0 05/22/2024 0057   GLUCOSEU NEGATIVE 05/22/2024 0057   HGBUR MODERATE (A) 05/22/2024 0057   BILIRUBINUR NEGATIVE 05/22/2024 0057   BILIRUBINUR negative 08/12/2019 1130   KETONESUR NEGATIVE  05/22/2024 0057   PROTEINUR 30 (A) 05/22/2024 0057   UROBILINOGEN 0.2 08/12/2019 1130   NITRITE POSITIVE (A) 05/22/2024 0057   LEUKOCYTESUR LARGE (A) 05/22/2024 0057   Sepsis Labs: @LABRCNTIP (procalcitonin:4,lacticidven:4)  ) Recent Results (from the past 240 hours)  Culture, blood (routine x 2)     Status: None (Preliminary result)   Collection Time: 05/22/24 10:30 PM   Specimen: BLOOD  Result Value Ref Range Status   Specimen Description   Final    BLOOD RIGHT ANTECUBITAL Performed at [facility], [address] [place], [place] [address]    Special Requests   Final    BOTTLES DRAWN AEROBIC AND ANAEROBIC Blood Culture adequate volume Performed at [facility], [address], [place], [place] [address]    Culture   Final    NO GROWTH < 12 HOURS Performed at [facility], [address] [place], [place] [address]    Report Status PENDING  Incomplete  Culture, blood (routine x 2)     Status: None (Preliminary result)   Collection Time: 05/22/24 10:39 PM  Specimen: BLOOD LEFT FOREARM  Result Value Ref Range Status   Specimen Description   Final    BLOOD LEFT FOREARM Performed at [facility], [address], [place], [place] [address]    Special Requests   Final    BOTTLES DRAWN AEROBIC AND ANAEROBIC Blood Culture results may not be optimal due to an inadequate volume of blood received in culture bottles Performed at [facility], [address] [place], [place] [address]    Culture   Final    NO GROWTH < 12 HOURS Performed at [facility], [address] [place], [place] [address]    Report Status PENDING  Incomplete         Radiology Studies: CT T-SPINE NO CHARGE Result Date: 05/22/2024 CLINICAL DATA:  Trauma history of metastatic disease EXAM: CT Thoracic and Lumbar spine without contrast TECHNIQUE: Multiplanar CT images of the thoracic and lumbar spine were reconstructed from  contemporary CT of the Chest, Abdomen, and Pelvis. RADIATION DOSE REDUCTION: This exam was performed according to the departmental dose-optimization program which includes automated exposure control, adjustment of the mA and/or kV according to patient size and/or use of iterative reconstruction technique. CONTRAST:  None or No additional COMPARISON:  MRI 04/14/2024, 04/08/2024, 03/27/2024, 09/19/2022 FINDINGS: CT THORACIC SPINE FINDINGS Alignment: Stable thoracic alignment Vertebrae: Wide spread lytic and sclerotic skeletal metastatic disease throughout the thoracic spine. Pathologic compression fractures at T3, T4, T5, T6 and T8, grossly similar as compared to the MRI from November. T8 compression fracture with estimated 30% loss of vertebral body height but not progressive. Interim development of superior endplate fracture at T10 with estimated 25% loss of vertebral body height centrally. New moderate compression fracture of T12, approaching 50% loss of vertebral body height. Mild retropulsion of T12 vertebral body measuring about 3 mm. Paraspinal and other soft tissues: Paravertebral edema and soft tissue swelling at multiple levels. Disc levels: Dorsal epidural lipomatosis with mass effect on the thecal sac/cord which are displaced anteriorly, this is most notable from T2-T3 through T8-T9 level. CT LUMBAR SPINE FINDINGS Segmentation: 5 lumbar type vertebrae. Alignment: Normal. Vertebrae: Widespread lucent and sclerotic metastatic disease. Compared with the MRI from October, interval mild to moderate superior endplate compression fracture at L1 with about 25% loss of vertebral body height centrally. Mild 2 mm retropulsion of the upper vertebral body without significant canal stenosis. Mild superior endplate fracture deformity at L4 with about 20% loss of vertebral body height centrally. Mild inferior endplate deformity at L3. Paraspinal and other soft tissues: Mild paravertebral edema at L1. Disc levels: At  L1-L2, patent disc space. No canal stenosis or foraminal narrowing. At L2-L3, no significant disc bulge. Facet degenerative changes. Dorsal epidural lipomatosis with displacement of the thecal sac anteriorly. No foraminal narrowing. At L3-L4, diffuse disc bulge. Facet degenerative changes. There is bilateral foraminal narrowing. At L4-L5, diffuse disc bulge. Hypertrophic facet degenerative changes and ligamentum flavum thickening. At L5-S1, no high-grade canal stenosis. Bilateral facet degenerative changes and right greater than left foraminal narrowing. IMPRESSION: 1. Widespread lucent and sclerotic metastatic disease throughout the thoracic and lumbar spine. 2. Interim development of compression fractures at T10 and T12. Mild retropulsion of T12 vertebral body measuring about 3 mm. 3. Interim compression fractures at L1, inferior endplate of L3, and superior endplate of L4. Mild 2 mm retropulsion of the upper L1 vertebral body without significant canal stenosis. 4. Dorsal epidural lipomatosis with mass effect on the thecal sac/cord which are displaced anteriorly,  this is most notable from T2-T3 through T8-T9 level. Electronically Signed   By: Luke Bun M.D.   On: 05/22/2024 20:58   CT L-SPINE NO CHARGE Result Date: 05/22/2024 CLINICAL DATA:  Trauma history of metastatic disease EXAM: CT Thoracic and Lumbar spine without contrast TECHNIQUE: Multiplanar CT images of the thoracic and lumbar spine were reconstructed from contemporary CT of the Chest, Abdomen, and Pelvis. RADIATION DOSE REDUCTION: This exam was performed according to the departmental dose-optimization program which includes automated exposure control, adjustment of the mA and/or kV according to patient size and/or use of iterative reconstruction technique. CONTRAST:  None or No additional COMPARISON:  MRI 04/14/2024, 04/08/2024, 03/27/2024, 09/19/2022 FINDINGS: CT THORACIC SPINE FINDINGS Alignment: Stable thoracic alignment Vertebrae: Wide  spread lytic and sclerotic skeletal metastatic disease throughout the thoracic spine. Pathologic compression fractures at T3, T4, T5, T6 and T8, grossly similar as compared to the MRI from November. T8 compression fracture with estimated 30% loss of vertebral body height but not progressive. Interim development of superior endplate fracture at T10 with estimated 25% loss of vertebral body height centrally. New moderate compression fracture of T12, approaching 50% loss of vertebral body height. Mild retropulsion of T12 vertebral body measuring about 3 mm. Paraspinal and other soft tissues: Paravertebral edema and soft tissue swelling at multiple levels. Disc levels: Dorsal epidural lipomatosis with mass effect on the thecal sac/cord which are displaced anteriorly, this is most notable from T2-T3 through T8-T9 level. CT LUMBAR SPINE FINDINGS Segmentation: 5 lumbar type vertebrae. Alignment: Normal. Vertebrae: Widespread lucent and sclerotic metastatic disease. Compared with the MRI from October, interval mild to moderate superior endplate compression fracture at L1 with about 25% loss of vertebral body height centrally. Mild 2 mm retropulsion of the upper vertebral body without significant canal stenosis. Mild superior endplate fracture deformity at L4 with about 20% loss of vertebral body height centrally. Mild inferior endplate deformity at L3. Paraspinal and other soft tissues: Mild paravertebral edema at L1. Disc levels: At L1-L2, patent disc space. No canal stenosis or foraminal narrowing. At L2-L3, no significant disc bulge. Facet degenerative changes. Dorsal epidural lipomatosis with displacement of the thecal sac anteriorly. No foraminal narrowing. At L3-L4, diffuse disc bulge. Facet degenerative changes. There is bilateral foraminal narrowing. At L4-L5, diffuse disc bulge. Hypertrophic facet degenerative changes and ligamentum flavum thickening. At L5-S1, no high-grade canal stenosis. Bilateral facet  degenerative changes and right greater than left foraminal narrowing. IMPRESSION: 1. Widespread lucent and sclerotic metastatic disease throughout the thoracic and lumbar spine. 2. Interim development of compression fractures at T10 and T12. Mild retropulsion of T12 vertebral body measuring about 3 mm. 3. Interim compression fractures at L1, inferior endplate of L3, and superior endplate of L4. Mild 2 mm retropulsion of the upper L1 vertebral body without significant canal stenosis. 4. Dorsal epidural lipomatosis with mass effect on the thecal sac/cord which are displaced anteriorly, this is most notable from T2-T3 through T8-T9 level. Electronically Signed   By: Luke Bun M.D.   On: 05/22/2024 20:58   CT CHEST ABDOMEN PELVIS W CONTRAST Result Date: 05/22/2024 CLINICAL DATA:  Blunt trauma EXAM: CT CHEST, ABDOMEN, AND PELVIS WITH CONTRAST TECHNIQUE: Multidetector CT imaging of the chest, abdomen and pelvis was performed following the standard protocol during bolus administration of intravenous contrast. RADIATION DOSE REDUCTION: This exam was performed according to the departmental dose-optimization program which includes automated exposure control, adjustment of the mA and/or kV according to patient size and/or use of iterative reconstruction technique. CONTRAST:  OMNIPAQUE  IOHEXOL  300 MG/ML  SOLN COMPARISON:  Radiographs 05/22/2024, MRI 04/14/2024, CT 11/27/2023, chest x-ray 04/10/2024 FINDINGS: CT CHEST FINDINGS Cardiovascular: Nonaneurysmal aorta. Atherosclerosis. Multi vessel coronary vascular calcification. Normal cardiac size. No pericardial effusion. Mediastinum/Nodes: Patent trachea. No thyroid  mass. No suspicious lymph nodes. Esophagus is grossly unremarkable. Lungs/Pleura: Consolidative airspace disease in the left lower lobe with mild subpleural consolidation in the right lower lobe. Mild bronchiectasis in the lower lobes. Mild bronchiectasis in the left upper lobe. Heterogeneous  ground-glass disease in the left greater than right upper lobes. No pleural effusion or pneumothorax. Musculoskeletal: Lytic and sclerotic lesions within the sternum ribs spine and pelvis corresponding to history of skeletal metastatic disease. Compared with the CT from June 2025, there is progressive metastatic disease in the spine with multiple pathologic compression fractures. CT ABDOMEN PELVIS FINDINGS Hepatobiliary: No focal liver abnormality is seen. No gallstones, gallbladder wall thickening, or biliary dilatation. Pancreas: Unremarkable. No pancreatic ductal dilatation or surrounding inflammatory changes. Spleen: Normal in size without focal abnormality. Adrenals/Urinary Tract: Adrenal glands are normal. Kidneys show no hydronephrosis. Prominence of the ureters without obstructing stone. Catheter in the bladder. Diffuse thick-walled appearance of the bladder with mucosal enhancement. Stomach/Bowel: Stomach is decompressed. There is no dilated small bowel. No acute bowel wall thickening. Moderate large colonic stool burden. Vascular/Lymphatic: Aortic atherosclerosis. No enlarged abdominal or pelvic lymph nodes. Reproductive: Heterogenous enhancing tissue at the prostate bed, series 3, image 1009 and sagittal series 9, image 91. Other: Negative for ascites or free air Musculoskeletal: See separately dictated spine CT. Widespread lytic and sclerotic metastatic disease. IMPRESSION: 1. No CT evidence for acute traumatic abnormality involving the chest abdomen or pelvis. 2. Consolidative airspace disease in the left lower lobe with mild subpleural consolidation in the right lower lobe, findings could be due to pneumonia or aspiration. Heterogeneous ground-glass disease in the left greater than right upper lobes, with some bronchiectasis noted, findings could be due to residual pneumonia or lung inflammatory process with developing post inflammatory scarring. 3. Widespread lytic and sclerotic metastatic disease. 4.  Diffuse thick-walled appearance of the bladder with mucosal enhancement, question cystitis. 5. Irregular enhancing tissue at the prostate bed. 6. Aortic atherosclerosis. Aortic Atherosclerosis (ICD10-I70.0). Electronically Signed   By: Luke Bun M.D.   On: 05/22/2024 20:24   CT Head Wo Contrast Result Date: 05/22/2024 CLINICAL DATA:  Recent fall with headaches and neck pain, initial encounter EXAM: CT HEAD WITHOUT CONTRAST CT CERVICAL SPINE WITHOUT CONTRAST TECHNIQUE: Multidetector CT imaging of the head and cervical spine was performed following the standard protocol without intravenous contrast. Multiplanar CT image reconstructions of the cervical spine were also generated. RADIATION DOSE REDUCTION: This exam was performed according to the departmental dose-optimization program which includes automated exposure control, adjustment of the mA and/or kV according to patient size and/or use of iterative reconstruction technique. COMPARISON:  09/19/2022 FINDINGS: CT HEAD FINDINGS Brain: No evidence of acute infarction, hemorrhage, hydrocephalus, extra-axial collection or mass lesion/mass effect. Mild atrophic changes and chronic white matter ischemic changes are seen. Lacunar infarct in the right thalamus is noted. Vascular: No hyperdense vessel or unexpected calcification. Skull: Normal. Negative for fracture or focal lesion. Sinuses/Orbits: No acute finding. Other: None. CT CERVICAL SPINE FINDINGS Alignment: Loss of the normal cervical lordosis is noted. Skull base and vertebrae: 7 cervical segments are well visualized. Vertebral body height is well maintained. Increased sclerosis is noted in the anterior aspect of the C4 vertebral body likely related to prior prostate metastatic disease given the clinical history.  No acute fracture or acute facet abnormality is noted. Multilevel osteophytic change and facet hypertrophic changes are noted. Soft tissues and spinal canal: Surrounding soft tissue structures are  within normal limits. Upper chest: Visualized lung apices are within normal limits. Other: None IMPRESSION: CT of the head: No acute intracranial abnormality noted. Chronic atrophic and ischemic changes. CT of the cervical spine: Multilevel degenerative change without acute abnormality. Electronically Signed   By: Oneil Devonshire M.D.   On: 05/22/2024 20:04   CT Cervical Spine Wo Contrast Result Date: 05/22/2024 CLINICAL DATA:  Recent fall with headaches and neck pain, initial encounter EXAM: CT HEAD WITHOUT CONTRAST CT CERVICAL SPINE WITHOUT CONTRAST TECHNIQUE: Multidetector CT imaging of the head and cervical spine was performed following the standard protocol without intravenous contrast. Multiplanar CT image reconstructions of the cervical spine were also generated. RADIATION DOSE REDUCTION: This exam was performed according to the departmental dose-optimization program which includes automated exposure control, adjustment of the mA and/or kV according to patient size and/or use of iterative reconstruction technique. COMPARISON:  09/19/2022 FINDINGS: CT HEAD FINDINGS Brain: No evidence of acute infarction, hemorrhage, hydrocephalus, extra-axial collection or mass lesion/mass effect. Mild atrophic changes and chronic white matter ischemic changes are seen. Lacunar infarct in the right thalamus is noted. Vascular: No hyperdense vessel or unexpected calcification. Skull: Normal. Negative for fracture or focal lesion. Sinuses/Orbits: No acute finding. Other: None. CT CERVICAL SPINE FINDINGS Alignment: Loss of the normal cervical lordosis is noted. Skull base and vertebrae: 7 cervical segments are well visualized. Vertebral body height is well maintained. Increased sclerosis is noted in the anterior aspect of the C4 vertebral body likely related to prior prostate metastatic disease given the clinical history. No acute fracture or acute facet abnormality is noted. Multilevel osteophytic change and facet hypertrophic  changes are noted. Soft tissues and spinal canal: Surrounding soft tissue structures are within normal limits. Upper chest: Visualized lung apices are within normal limits. Other: None IMPRESSION: CT of the head: No acute intracranial abnormality noted. Chronic atrophic and ischemic changes. CT of the cervical spine: Multilevel degenerative change without acute abnormality. Electronically Signed   By: Oneil Devonshire M.D.   On: 05/22/2024 20:04   DG Pelvis Portable Result Date: 05/22/2024 EXAM: 1 or 2 VIEW(S) XRAY OF THE PELVIS 05/22/2024 05:55:00 PM COMPARISON: Comparison 11/27/2023. CLINICAL HISTORY: fall pain FINDINGS: BONES AND JOINTS: Bony sclerotic metastases are present in the pelvis, though not as well visualized on today's radiograph compared to the comparison CT. There is a new 8 mm sclerotic lesion in the left intertrochanteric region. No pelvic bone diastasis. Mild multilevel degenerative disc disease of the lumbar spine. SOFT TISSUES: The soft tissues are unremarkable. IMPRESSION: 1. No acute fracture, pelvic bone diastasis, or dislocation. 2. New 8 mm sclerotic density within the left femoral intertrochanteric region is worrisome for progressive metastatic disease. The majority of the sclerotic lesions on the prior CT are not as well visualized on today's radiograph. Electronically signed by: Rogelia Myers MD 05/22/2024 06:35 PM EST RP Workstation: [id number]   DG Chest Port 1 View Result Date: 05/22/2024 EXAM: 1 VIEW(S) XRAY OF THE CHEST 05/22/2024 05:54:00 PM COMPARISON: 04/14/2024, 11/27/2023 CLINICAL HISTORY: fall pain FINDINGS: LUNGS AND PLEURA: Mild persistent reticulation in the left upper lung zone otherwise, significant improvement of the lungs compared to the prior study. No pleural effusion. No pneumothorax. HEART AND MEDIASTINUM: No acute abnormality of the cardiac and mediastinal silhouettes. BONES AND SOFT TISSUES: Multilevel thoracic osteophytosis. IMPRESSION: 1. Significant  improvement  of the lungs compared to the prior study, with mild persistent reticulation in the left upper lung zone, possibly scarring. Otherwise, no acute cardiopulmonary abnormality. Mhm Electronically signed by: Rogelia Myers MD 05/22/2024 06:30 PM EST RP Workstation: [id number]        Scheduled Meds:  folic acid   1 mg Oral Daily   morphine   60 mg Oral Q12H   multivitamin with minerals  1 tablet Oral Daily   sodium chloride  flush  3 mL Intravenous Q12H   thiamine   100 mg Oral Daily   Or   thiamine   100 mg Intravenous Daily   Continuous Infusions:  lactated ringers  125 mL/hr at 05/23/24 0042     LOS: 1 day    Time spent: 35 minutes.    Leatrice Chapel, MD  [facility] Pager #: [phone] 7PM-7AM contact night coverage as above    "

## 2024-05-23 NOTE — ED Notes (Addendum)
 MD made aware of pt soft bp, and that pt would not be able to tolerate morphine  with current bp of 93/52, please see orders, pt will be medicated per [facility] with fluid and once bp has increased Morphine  will be given. Pt resting on stretcher, RR even and unlabored, pt with eyes closed, RN woke pt and asked about pain and pt reports 5/10 and tolerable, pt denies needing medication at this very moment. Bed low and locked, rails x2, call light in reach, pt on cardiac monitor , bp cuff and pulse ox, pt awaiting bed at [facility].

## 2024-05-23 NOTE — ED Notes (Signed)
 Had to adjust potassium due to patient had to use the restroom and was unable to take pump due to battery was dead.

## 2024-05-24 DIAGNOSIS — S22000S Wedge compression fracture of unspecified thoracic vertebra, sequela: Secondary | ICD-10-CM | POA: Diagnosis not present

## 2024-05-24 MED ORDER — LIDOCAINE 5 % EX PTCH
1.0000 | MEDICATED_PATCH | CUTANEOUS | Status: DC
  Administered 2024-05-25 – 2024-05-26 (×2): 1 via TRANSDERMAL
  Filled 2024-05-24 (×3): qty 1

## 2024-05-24 MED ORDER — NICOTINE 14 MG/24HR TD PT24
14.0000 mg | MEDICATED_PATCH | Freq: Every day | TRANSDERMAL | Status: DC
  Administered 2024-05-24 – 2024-05-27 (×4): 14 mg via TRANSDERMAL
  Filled 2024-05-24 (×4): qty 1

## 2024-05-24 NOTE — Progress Notes (Signed)
 PT Cancellation Note  Patient Details Name: Finneus Kaneshiro MRN: [id number] DOB: Feb 26, 1961   Cancelled Treatment:    Reason Eval/Treat Not Completed: Other (comment)  Noted previous note that pt refused TLSO from ortho tech. States he has a brace at home and wants his friend Virgie) to bring it here. Explained it may not be the right brace and he is getting weaker the longer he stays in the bed waiting for a brace. He continued to refuse to allow ortho tech to deliver the TLSO. He tried to call [name] to confirm she can bring brace to him and had to leave her a voicemail. Amber, RN made aware of barrier to increasing his mobility.    Macario RAMAN, PT Acute Rehabilitation Services  Office [phone]  Macario SHAUNNA Soja 05/24/2024, 11:20 AM

## 2024-05-24 NOTE — Progress Notes (Signed)
 Orthopedic Tech Progress Note Patient Details:  David Townsend [birth year] [id number] Saturday AM OT stated patient has brace @ home. Family would possibly bring it. Patient ID: [name], male   DOB: 17-Feb-1961, 63 y.o.   MRN: [id number]  Giovanni LITTIE Lukes 05/24/2024, 8:40 AM

## 2024-05-24 NOTE — Plan of Care (Signed)
  Problem: Pain Managment: Goal: General experience of comfort will improve and/or be controlled Outcome: Progressing   Problem: Coping: Goal: Level of anxiety will decrease Outcome: Progressing   Problem: Safety: Goal: Ability to remain free from injury will improve Outcome: Progressing

## 2024-05-24 NOTE — Progress Notes (Signed)
 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  Patient was admitted with fall, with resultant multiple vertebral compression fracture and pain.  CT lumbar spine revealed widespread lucent and sclerotic metastatic disease throughout the thoracic and lumbar spine.  Interval development of compression fractures of T10 and T12, dorsal epidural lipomatosis with mass effect on the thecal sac/cord which are displaced anteriorly, most notable from T2-T3 through T8-T9 level.  As per neurosurgery team, patient is not a surgical candidate.  UA revealed large leukocyte esterase, positive nitrite and many bacteria.  Specific gravity was 1.031.  Serum creatinine of 1.21 on presentation, but down to 0.86.  05/23/2024: Continue to manage supportively.  Control pain.  Hydrate patient, managed supportively.  No new complaints today.  05/24/2024: Patient seen.  Pain is controlled.  No new changes.  PT/OT have recommended SNF.  Pursue disposition.  Assessment & Plan:   Principal Problem:   Vertebral compression fracture (HCC) Active Problems:   ETOH abuse   Anemia in neoplastic disease   Prostate cancer metastatic to bone (HCC)   Drug-induced constipation   Multiple vertebral compression fractures post fall in patient with prostate cancer with known bone mets. - Neurosurgery, PA Jennetta was called by the emergency department.  They recommended brace and stated he is not a candidate for surgery at this time. - Pain control.  As patient is already on MS Contin .  Will just increase his short acting pain medication for now and evaluate. - Consider palliative care involvement to help with symptom management - Awaiting a brace as  recommended by neurosurgery - Consider a dose of Decadron  for mass effect on the thecal sac - Continue home pain meds of MS contin  plus  MS IR for breakthrough 05/23/2024: Continue to manage supportively.  Adequate pain control. 05/24/2024: SNF is recommended.  Pursue disposition.   Constipation: - Patient has been on opiates. - Continue MiraLAX  trial. - 1 dose of Relistor  given.   AKI: - Resolved. - Continue to monitor renal function and electrolytes. - Adequate hydration.   Hypokalemia: - Resolved significantly. - Last potassium was 3.7.   .    Elevated alk phos and ALT: - Likely due to cancer with bone mets.    Anemia: - Last hemoglobin was 9.3 g/dL.   - Continue to be monitor closely.  Thrombocytopenia: - Last platelet count was 106. - Continue to monitor closely. - Repeat CBC in the morning.  - Low threshold to check HIT antibody.  Possible UTI versus colonization: - No urine culture visualized.    -UA is positive for nitrite, large leukocyte esterase and many bacteria.   DVT prophylaxis: SCD. Code Status: Full code Family Communication:  Disposition Plan:    Consultants:  As per collateral information, and neurosurgery team has deemed patient not an ideal surgical candidate.  Procedures:  None.  Antimicrobials:  IV ceftriaxone .   Subjective: No complaints.  Objective: Vitals:   05/23/24 2000 05/24/24 0443 05/24/24 0736 05/24/24 1457  BP: (!) 102/51 (!) 114/59 (!) 115/59 137/78  Pulse: 75 79 83 100  Resp:  18 18 16   Temp:  (!) 100.4 F (38 C) 98.4 F (36.9 C) (!) 100.9 F (38.3 C)  TempSrc:  Oral  SpO2:  (!) 89% 91% 96%  Weight:      Height:        Intake/Output Summary (Last 24 hours) at 05/24/2024 1947 Last data filed at 05/24/2024 1700 Gross per 24 hour  Intake 1686.11 ml  Output 2000 ml  Net -313.89 ml   Filed Weights   05/22/24 1652 05/23/24 0600  Weight: 63 kg 62.3 kg    Examination:  General exam: Appears calm and  comfortable  Respiratory system: Clear to auscultation.  Cardiovascular system: S1 & S2 heard Gastrointestinal system: Soft and nontender.   Central nervous system: Alert and oriented.    Data Reviewed: I have personally reviewed following labs and imaging studies  CBC: Recent Labs  Lab 05/22/24 1825 05/23/24 0530  WBC 7.5 9.2  NEUTROABS 6.8  --   HGB 8.9* 9.3*  HCT 26.0* 27.2*  MCV 111.6* 111.5*  PLT 115* 106*   Basic Metabolic Panel: Recent Labs  Lab 05/22/24 1825 05/23/24 0530  NA 132* 134*  K 3.1* 3.7  CL 97* 99  CO2 23 23  GLUCOSE 86 93  BUN 20 15  CREATININE 1.31* 0.86  CALCIUM  8.3* 8.6*  MG  --  2.1   GFR: Estimated Creatinine Clearance: 77.5 mL/min (by C-G formula based on SCr of 0.86 mg/dL). Liver Function Tests: Recent Labs  Lab 05/22/24 1825 05/23/24 0530  AST 91* 124*  ALT 24 33  ALKPHOS 172* 174*  BILITOT 0.2 <0.2  PROT 5.4* 5.4*  ALBUMIN 3.3* 3.2*   No results for input(s): LIPASE, AMYLASE in the last 168 hours. No results for input(s): AMMONIA in the last 168 hours. Coagulation Profile: Recent Labs  Lab 05/22/24 1825  INR 1.0   Cardiac Enzymes: No results for input(s): CKTOTAL, CKMB, CKMBINDEX, TROPONINI in the last 168 hours. BNP (last 3 results) No results for input(s): PROBNP in the last 8760 hours. HbA1C: No results for input(s): HGBA1C in the last 72 hours. CBG: Recent Labs  Lab 05/23/24 0644  GLUCAP 106*   Lipid Profile: No results for input(s): CHOL, HDL, LDLCALC, TRIG, CHOLHDL, LDLDIRECT in the last 72 hours. Thyroid  Function Tests: No results for input(s): TSH, T4TOTAL, FREET4, T3FREE, THYROIDAB in the last 72 hours. Anemia Panel: No results for input(s): VITAMINB12, FOLATE, FERRITIN, TIBC, IRON , RETICCTPCT in the last 72 hours. Urine analysis:    Component Value Date/Time   COLORURINE YELLOW 05/22/2024 0057   APPEARANCEUR CLOUDY (A) 05/22/2024 0057   LABSPEC  1.031 (H) 05/22/2024 0057   PHURINE 6.0 05/22/2024 0057   GLUCOSEU NEGATIVE 05/22/2024 0057   HGBUR MODERATE (A) 05/22/2024 0057   BILIRUBINUR NEGATIVE 05/22/2024 0057   BILIRUBINUR negative 08/12/2019 1130   KETONESUR NEGATIVE 05/22/2024 0057   PROTEINUR 30 (A) 05/22/2024 0057   UROBILINOGEN 0.2 08/12/2019 1130   NITRITE POSITIVE (A) 05/22/2024 0057   LEUKOCYTESUR LARGE (A) 05/22/2024 0057   Sepsis Labs: @LABRCNTIP (procalcitonin:4,lacticidven:4)  ) Recent Results (from the past 240 hours)  Culture, blood (routine x 2)     Status: None (Preliminary result)   Collection Time: 05/22/24 10:30 PM   Specimen: BLOOD  Result Value Ref Range Status   Specimen Description   Final    BLOOD RIGHT ANTECUBITAL Performed at [facility], [address] [place], [place] [address]    Special Requests   Final    BOTTLES DRAWN AEROBIC AND ANAEROBIC Blood Culture adequate volume Performed at [facility], [address] [place], [place] [address]    Culture  Final    NO GROWTH 1 DAY Performed at [facility], [address], [place], [place] [address]    Report Status PENDING  Incomplete  Culture, blood (routine x 2)     Status: None (Preliminary result)   Collection Time: 05/22/24 10:39 PM   Specimen: BLOOD LEFT FOREARM  Result Value Ref Range Status   Specimen Description   Final    BLOOD LEFT FOREARM Performed at [facility], [address], [place], [place] [address]    Special Requests   Final    BOTTLES DRAWN AEROBIC AND ANAEROBIC Blood Culture results may not be optimal due to an inadequate volume of blood received in culture bottles Performed at [facility], [address], [place], [place] [address]    Culture   Final    NO GROWTH 1 DAY Performed at [facility], [address] [place], [place] [address]    Report Status PENDING  Incomplete         Radiology Studies: CT T-SPINE NO  CHARGE Result Date: 05/22/2024 CLINICAL DATA:  Trauma history of metastatic disease EXAM: CT Thoracic and Lumbar spine without contrast TECHNIQUE: Multiplanar CT images of the thoracic and lumbar spine were reconstructed from contemporary CT of the Chest, Abdomen, and Pelvis. RADIATION DOSE REDUCTION: This exam was performed according to the departmental dose-optimization program which includes automated exposure control, adjustment of the mA and/or kV according to patient size and/or use of iterative reconstruction technique. CONTRAST:  None or No additional COMPARISON:  MRI 04/14/2024, 04/08/2024, 03/27/2024, 09/19/2022 FINDINGS: CT THORACIC SPINE FINDINGS Alignment: Stable thoracic alignment Vertebrae: Wide spread lytic and sclerotic skeletal metastatic disease throughout the thoracic spine. Pathologic compression fractures at T3, T4, T5, T6 and T8, grossly similar as compared to the MRI from November. T8 compression fracture with estimated 30% loss of vertebral body height but not progressive. Interim development of superior endplate fracture at T10 with estimated 25% loss of vertebral body height centrally. New moderate compression fracture of T12, approaching 50% loss of vertebral body height. Mild retropulsion of T12 vertebral body measuring about 3 mm. Paraspinal and other soft tissues: Paravertebral edema and soft tissue swelling at multiple levels. Disc levels: Dorsal epidural lipomatosis with mass effect on the thecal sac/cord which are displaced anteriorly, this is most notable from T2-T3 through T8-T9 level. CT LUMBAR SPINE FINDINGS Segmentation: 5 lumbar type vertebrae. Alignment: Normal. Vertebrae: Widespread lucent and sclerotic metastatic disease. Compared with the MRI from October, interval mild to moderate superior endplate compression fracture at L1 with about 25% loss of vertebral body height centrally. Mild 2 mm retropulsion of the upper vertebral body without significant canal stenosis. Mild  superior endplate fracture deformity at L4 with about 20% loss of vertebral body height centrally. Mild inferior endplate deformity at L3. Paraspinal and other soft tissues: Mild paravertebral edema at L1. Disc levels: At L1-L2, patent disc space. No canal stenosis or foraminal narrowing. At L2-L3, no significant disc bulge. Facet degenerative changes. Dorsal epidural lipomatosis with displacement of the thecal sac anteriorly. No foraminal narrowing. At L3-L4, diffuse disc bulge. Facet degenerative changes. There is bilateral foraminal narrowing. At L4-L5, diffuse disc bulge. Hypertrophic facet degenerative changes and ligamentum flavum thickening. At L5-S1, no high-grade canal stenosis. Bilateral facet degenerative changes and right greater than left foraminal narrowing. IMPRESSION: 1. Widespread lucent and sclerotic metastatic disease throughout the thoracic and lumbar spine. 2. Interim development of compression fractures at T10 and T12. Mild retropulsion of  T12 vertebral body measuring about 3 mm. 3. Interim compression fractures at L1, inferior endplate of L3, and superior endplate of L4. Mild 2 mm retropulsion of the upper L1 vertebral body without significant canal stenosis. 4. Dorsal epidural lipomatosis with mass effect on the thecal sac/cord which are displaced anteriorly, this is most notable from T2-T3 through T8-T9 level. Electronically Signed   By: Luke Bun M.D.   On: 05/22/2024 20:58   CT L-SPINE NO CHARGE Result Date: 05/22/2024 CLINICAL DATA:  Trauma history of metastatic disease EXAM: CT Thoracic and Lumbar spine without contrast TECHNIQUE: Multiplanar CT images of the thoracic and lumbar spine were reconstructed from contemporary CT of the Chest, Abdomen, and Pelvis. RADIATION DOSE REDUCTION: This exam was performed according to the departmental dose-optimization program which includes automated exposure control, adjustment of the mA and/or kV according to patient size and/or use of  iterative reconstruction technique. CONTRAST:  None or No additional COMPARISON:  MRI 04/14/2024, 04/08/2024, 03/27/2024, 09/19/2022 FINDINGS: CT THORACIC SPINE FINDINGS Alignment: Stable thoracic alignment Vertebrae: Wide spread lytic and sclerotic skeletal metastatic disease throughout the thoracic spine. Pathologic compression fractures at T3, T4, T5, T6 and T8, grossly similar as compared to the MRI from November. T8 compression fracture with estimated 30% loss of vertebral body height but not progressive. Interim development of superior endplate fracture at T10 with estimated 25% loss of vertebral body height centrally. New moderate compression fracture of T12, approaching 50% loss of vertebral body height. Mild retropulsion of T12 vertebral body measuring about 3 mm. Paraspinal and other soft tissues: Paravertebral edema and soft tissue swelling at multiple levels. Disc levels: Dorsal epidural lipomatosis with mass effect on the thecal sac/cord which are displaced anteriorly, this is most notable from T2-T3 through T8-T9 level. CT LUMBAR SPINE FINDINGS Segmentation: 5 lumbar type vertebrae. Alignment: Normal. Vertebrae: Widespread lucent and sclerotic metastatic disease. Compared with the MRI from October, interval mild to moderate superior endplate compression fracture at L1 with about 25% loss of vertebral body height centrally. Mild 2 mm retropulsion of the upper vertebral body without significant canal stenosis. Mild superior endplate fracture deformity at L4 with about 20% loss of vertebral body height centrally. Mild inferior endplate deformity at L3. Paraspinal and other soft tissues: Mild paravertebral edema at L1. Disc levels: At L1-L2, patent disc space. No canal stenosis or foraminal narrowing. At L2-L3, no significant disc bulge. Facet degenerative changes. Dorsal epidural lipomatosis with displacement of the thecal sac anteriorly. No foraminal narrowing. At L3-L4, diffuse disc bulge. Facet  degenerative changes. There is bilateral foraminal narrowing. At L4-L5, diffuse disc bulge. Hypertrophic facet degenerative changes and ligamentum flavum thickening. At L5-S1, no high-grade canal stenosis. Bilateral facet degenerative changes and right greater than left foraminal narrowing. IMPRESSION: 1. Widespread lucent and sclerotic metastatic disease throughout the thoracic and lumbar spine. 2. Interim development of compression fractures at T10 and T12. Mild retropulsion of T12 vertebral body measuring about 3 mm. 3. Interim compression fractures at L1, inferior endplate of L3, and superior endplate of L4. Mild 2 mm retropulsion of the upper L1 vertebral body without significant canal stenosis. 4. Dorsal epidural lipomatosis with mass effect on the thecal sac/cord which are displaced anteriorly, this is most notable from T2-T3 through T8-T9 level. Electronically Signed   By: Luke Bun M.D.   On: 05/22/2024 20:58   CT CHEST ABDOMEN PELVIS W CONTRAST Result Date: 05/22/2024 CLINICAL DATA:  Blunt trauma EXAM: CT CHEST, ABDOMEN, AND PELVIS WITH CONTRAST TECHNIQUE: Multidetector CT imaging of the  chest, abdomen and pelvis was performed following the standard protocol during bolus administration of intravenous contrast. RADIATION DOSE REDUCTION: This exam was performed according to the departmental dose-optimization program which includes automated exposure control, adjustment of the mA and/or kV according to patient size and/or use of iterative reconstruction technique. CONTRAST:  OMNIPAQUE  IOHEXOL  300 MG/ML  SOLN COMPARISON:  Radiographs 05/22/2024, MRI 04/14/2024, CT 11/27/2023, chest x-ray 04/10/2024 FINDINGS: CT CHEST FINDINGS Cardiovascular: Nonaneurysmal aorta. Atherosclerosis. Multi vessel coronary vascular calcification. Normal cardiac size. No pericardial effusion. Mediastinum/Nodes: Patent trachea. No thyroid  mass. No suspicious lymph nodes. Esophagus is grossly unremarkable. Lungs/Pleura:  Consolidative airspace disease in the left lower lobe with mild subpleural consolidation in the right lower lobe. Mild bronchiectasis in the lower lobes. Mild bronchiectasis in the left upper lobe. Heterogeneous ground-glass disease in the left greater than right upper lobes. No pleural effusion or pneumothorax. Musculoskeletal: Lytic and sclerotic lesions within the sternum ribs spine and pelvis corresponding to history of skeletal metastatic disease. Compared with the CT from June 2025, there is progressive metastatic disease in the spine with multiple pathologic compression fractures. CT ABDOMEN PELVIS FINDINGS Hepatobiliary: No focal liver abnormality is seen. No gallstones, gallbladder wall thickening, or biliary dilatation. Pancreas: Unremarkable. No pancreatic ductal dilatation or surrounding inflammatory changes. Spleen: Normal in size without focal abnormality. Adrenals/Urinary Tract: Adrenal glands are normal. Kidneys show no hydronephrosis. Prominence of the ureters without obstructing stone. Catheter in the bladder. Diffuse thick-walled appearance of the bladder with mucosal enhancement. Stomach/Bowel: Stomach is decompressed. There is no dilated small bowel. No acute bowel wall thickening. Moderate large colonic stool burden. Vascular/Lymphatic: Aortic atherosclerosis. No enlarged abdominal or pelvic lymph nodes. Reproductive: Heterogenous enhancing tissue at the prostate bed, series 3, image 1009 and sagittal series 9, image 91. Other: Negative for ascites or free air Musculoskeletal: See separately dictated spine CT. Widespread lytic and sclerotic metastatic disease. IMPRESSION: 1. No CT evidence for acute traumatic abnormality involving the chest abdomen or pelvis. 2. Consolidative airspace disease in the left lower lobe with mild subpleural consolidation in the right lower lobe, findings could be due to pneumonia or aspiration. Heterogeneous ground-glass disease in the left greater than right upper  lobes, with some bronchiectasis noted, findings could be due to residual pneumonia or lung inflammatory process with developing post inflammatory scarring. 3. Widespread lytic and sclerotic metastatic disease. 4. Diffuse thick-walled appearance of the bladder with mucosal enhancement, question cystitis. 5. Irregular enhancing tissue at the prostate bed. 6. Aortic atherosclerosis. Aortic Atherosclerosis (ICD10-I70.0). Electronically Signed   By: Luke Bun M.D.   On: 05/22/2024 20:24   CT Head Wo Contrast Result Date: 05/22/2024 CLINICAL DATA:  Recent fall with headaches and neck pain, initial encounter EXAM: CT HEAD WITHOUT CONTRAST CT CERVICAL SPINE WITHOUT CONTRAST TECHNIQUE: Multidetector CT imaging of the head and cervical spine was performed following the standard protocol without intravenous contrast. Multiplanar CT image reconstructions of the cervical spine were also generated. RADIATION DOSE REDUCTION: This exam was performed according to the departmental dose-optimization program which includes automated exposure control, adjustment of the mA and/or kV according to patient size and/or use of iterative reconstruction technique. COMPARISON:  09/19/2022 FINDINGS: CT HEAD FINDINGS Brain: No evidence of acute infarction, hemorrhage, hydrocephalus, extra-axial collection or mass lesion/mass effect. Mild atrophic changes and chronic white matter ischemic changes are seen. Lacunar infarct in the right thalamus is noted. Vascular: No hyperdense vessel or unexpected calcification. Skull: Normal. Negative for fracture or focal lesion. Sinuses/Orbits: No acute finding. Other: None. CT  CERVICAL SPINE FINDINGS Alignment: Loss of the normal cervical lordosis is noted. Skull base and vertebrae: 7 cervical segments are well visualized. Vertebral body height is well maintained. Increased sclerosis is noted in the anterior aspect of the C4 vertebral body likely related to prior prostate metastatic disease given the  clinical history. No acute fracture or acute facet abnormality is noted. Multilevel osteophytic change and facet hypertrophic changes are noted. Soft tissues and spinal canal: Surrounding soft tissue structures are within normal limits. Upper chest: Visualized lung apices are within normal limits. Other: None IMPRESSION: CT of the head: No acute intracranial abnormality noted. Chronic atrophic and ischemic changes. CT of the cervical spine: Multilevel degenerative change without acute abnormality. Electronically Signed   By: Oneil Devonshire M.D.   On: 05/22/2024 20:04   CT Cervical Spine Wo Contrast Result Date: 05/22/2024 CLINICAL DATA:  Recent fall with headaches and neck pain, initial encounter EXAM: CT HEAD WITHOUT CONTRAST CT CERVICAL SPINE WITHOUT CONTRAST TECHNIQUE: Multidetector CT imaging of the head and cervical spine was performed following the standard protocol without intravenous contrast. Multiplanar CT image reconstructions of the cervical spine were also generated. RADIATION DOSE REDUCTION: This exam was performed according to the departmental dose-optimization program which includes automated exposure control, adjustment of the mA and/or kV according to patient size and/or use of iterative reconstruction technique. COMPARISON:  09/19/2022 FINDINGS: CT HEAD FINDINGS Brain: No evidence of acute infarction, hemorrhage, hydrocephalus, extra-axial collection or mass lesion/mass effect. Mild atrophic changes and chronic white matter ischemic changes are seen. Lacunar infarct in the right thalamus is noted. Vascular: No hyperdense vessel or unexpected calcification. Skull: Normal. Negative for fracture or focal lesion. Sinuses/Orbits: No acute finding. Other: None. CT CERVICAL SPINE FINDINGS Alignment: Loss of the normal cervical lordosis is noted. Skull base and vertebrae: 7 cervical segments are well visualized. Vertebral body height is well maintained. Increased sclerosis is noted in the anterior  aspect of the C4 vertebral body likely related to prior prostate metastatic disease given the clinical history. No acute fracture or acute facet abnormality is noted. Multilevel osteophytic change and facet hypertrophic changes are noted. Soft tissues and spinal canal: Surrounding soft tissue structures are within normal limits. Upper chest: Visualized lung apices are within normal limits. Other: None IMPRESSION: CT of the head: No acute intracranial abnormality noted. Chronic atrophic and ischemic changes. CT of the cervical spine: Multilevel degenerative change without acute abnormality. Electronically Signed   By: Oneil Devonshire M.D.   On: 05/22/2024 20:04        Scheduled Meds:  Chlorhexidine  Gluconate Cloth  6 each Topical Daily   folic acid   1 mg Oral Daily   lidocaine   1 patch Transdermal Q24H   morphine   60 mg Oral Q12H   multivitamin with minerals  1 tablet Oral Daily   nicotine   14 mg Transdermal Daily   sodium chloride  flush  3 mL Intravenous Q12H   thiamine   100 mg Oral Daily   Or   thiamine   100 mg Intravenous Daily   Continuous Infusions:  cefTRIAXone  (ROCEPHIN )  IV 1 g (05/24/24 1254)     LOS: 2 days    Time spent: 35 minutes.    Leatrice Chapel, MD  [facility] Pager #: [phone] 7PM-7AM contact night coverage as above    "

## 2024-05-24 NOTE — Evaluation (Signed)
 Occupational Therapy Evaluation Patient Details Name: David Townsend MRN: [id number] DOB: July 17, 1960 Today's Date: 05/24/2024   History of Present Illness   Pt is a 63 y.o. male presenting to [facility] ED for cancer-related pain and  multiple falls. Recently in  for same , DC 11/7. PMH significant for prostate cancer with spinal metastases, rib fx, anemia of chronic disease, GERD, HTN, HLD, cancer-related pain, tobacco use disorder and alcohol  use disorder. Imaging shows progression of diffuse bone metastasis to skull, ribs, spine, pelvis, humerus, and femurs.     Clinical Impressions Pt states he had been home x 1 month from rehab however pt confused and not oriented to time or place (January; [name]). Pt premedicated and TLSO donned EOB, pt able to stand with mod A @ RW level however unable to ambulate due to pain. Requires overall Max to total A with ADL due to pain and generalized weakness. Patient will benefit from continued inpatient follow up therapy, <3 hours/day to maximize functional level of independence. Pt agreeable to post acute rehab however he wants to go to a different facility. Acute OT to follow.   Pt asking for a nicotine  patch, Pt may benefit from lidocaine  patches for his back. Discussed with nsg.      If plan is discharge home, recommend the following:   Assistance with cooking/housework;Direct supervision/assist for medications management;Direct supervision/assist for financial management;Assist for transportation;Help with stairs or ramp for entrance;A lot of help with walking and/or transfers;A lot of help with bathing/dressing/bathroom     Functional Status Assessment   Patient has had a recent decline in their functional status and demonstrates the ability to make significant improvements in function in a reasonable and predictable amount of time.     Equipment Recommendations   Other (comment);BSC/3in1     Recommendations for Other Services          Precautions/Restrictions   Precautions Precautions: Back;Fall Precaution Booklet Issued: No Recall of Precautions/Restrictions: Impaired Precaution/Restrictions Comments: instructed in back precautions and lifting restrictions as related to ADLs and IADLs Required Braces or Orthoses: Spinal Brace Spinal Brace: Thoracolumbosacral orthotic Restrictions Weight Bearing Restrictions Per Provider Order: No Other Position/Activity Restrictions:  (Spinal precautions)     Mobility Bed Mobility Overal bed mobility: Needs Assistance Bed Mobility: Sidelying to Sit, Sit to Sidelying   Sidelying to sit:  (sitting EOB on entry)     Sit to sidelying: Mod assist, Used rails General bed mobility comments: assist for trunk and to gently elevate BLE  Began education on log rolling for back precautions; has previous back precaution information fro prior admission.   Transfers Overall transfer level: Needs assistance Equipment used: Rolling walker (2 wheels) Transfers: Sit to/from Stand Sit to Stand: Mod assist                  Balance Overall balance assessment: Needs assistance Sitting-balance support: Feet supported Sitting balance-Leahy Scale: Fair     Standing balance support: No upper extremity supported Standing balance-Leahy Scale: Poor                             ADL either performed or assessed with clinical judgement   ADL Overall ADL's : Needs assistance/impaired Eating/Feeding: Sitting;Set up   Grooming: Wash/dry hands;Set up;Bed level   Upper Body Bathing: Moderate assistance;Bed level   Lower Body Bathing: Total assistance;Sit to/from stand   Upper Body Dressing : Sitting;Maximal assistance   Lower Body Dressing: Total assistance;Sit  to/from Database Administrator:  (unable)   Toileting- Architect and Hygiene: Total assistance;Sit to/from stand (foley)       Functional mobility during ADLs: Rolling walker (2 wheels);Moderate  assistance; attempted mobility however pt stating I can't I can't; took several small shuffling steps EOB before having to sit       Vision Baseline Vision/History: 1 Wears glasses Ability to See in Adequate Light: 0 Adequate Patient Visual Report: No change from baseline       Perception         Praxis         Pertinent Vitals/Pain Pain Assessment Pain Assessment: 0-10 Pain Score: 10-Worst pain ever (none if not moving) Pain Location: back Pain Descriptors / Indicators: Grimacing, Crushing, Moaning Pain Intervention(s): Limited activity within patient's tolerance, Repositioned, Premedicated before session     Extremity/Trunk Assessment Upper Extremity Assessment Upper Extremity Assessment: Generalized weakness (ROM WFL)   Lower Extremity Assessment Lower Extremity Assessment: Defer to PT evaluation   Cervical / Trunk Assessment Cervical / Trunk Assessment: Other exceptions Cervical / Trunk Exceptions: spinal mets adn multiple fxs   Communication Communication Communication: No apparent difficulties   Cognition Arousal: Alert Behavior During Therapy: WFL for tasks assessed/performed Cognition: Cognition impaired   Orientation impairments: Time, Situation, Place Awareness: Intellectual awareness impaired, Online awareness impaired Memory impairment (select all impairments): Working civil service fast streamer, Conservation officer, historic buildings, Short-term memory Attention impairment (select first level of impairment): Sustained attention Executive functioning impairment (select all impairments): Initiation, Sequencing, Reasoning, Problem solving OT - Cognition Comments: asking for beer out of the frig adn a cigarette                 Following commands: Impaired Following commands impaired: Follows one step commands with increased time     Cueing  General Comments   Cueing Techniques: Verbal cues;Visual cues      Exercises Exercises: Other exercises Other Exercises Other  Exercises: incentive spirometer x 5 - able to pull 1500 ml   Shoulder Instructions      Home Living Family/patient expects to be discharged to:: Private residence Living Arrangements: Alone   Type of Home: House Home Access: Stairs to enter Secretary/administrator of Steps: 4 Entrance Stairs-Rails: Right;Left Home Layout: One level     Bathroom Shower/Tub: Chief Strategy Officer: Standard Bathroom Accessibility: Yes How Accessible: Accessible via walker Home Equipment: Rolling Walker (2 wheels)          Prior Functioning/Environment Prior Level of Function : History of Falls (last six months);Driving             Mobility Comments: was  ambulating  w/ no device per pt (unsure of reliability) ADLs Comments: independent, 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, 2023    OT Problem List: Pain;Decreased coordination;Impaired balance (sitting and/or standing);Decreased activity tolerance;Decreased range of motion;Decreased strength;Decreased knowledge of use of DME or AE;Decreased knowledge of precautions;Cardiopulmonary status limiting activity   OT Treatment/Interventions: Self-care/ADL training;DME and/or AE instruction;Therapeutic activities;Cognitive remediation/compensation;Patient/family education;Balance training;Therapeutic exercise      OT Goals(Current goals can be found in the care plan section)   Acute Rehab OT Goals Patient Stated Goal: to not have so much pain OT Goal Formulation: With patient Time For Goal Achievement: 06/07/24 Potential to Achieve Goals: Fair   OT Frequency:  Min 2X/week    Co-evaluation PT/OT/SLP Co-Evaluation/Treatment: Yes Reason for Co-Treatment: Complexity of the  patient's impairments (multi-system involvement);Necessary to address cognition/behavior during functional activity;For patient/therapist safety PT goals addressed during session:  Mobility/safety with mobility OT goals addressed during session: ADL's and self-care      AM-PAC OT 6 Clicks Daily Activity     Outcome Measure Help from another person eating meals?: None Help from another person taking care of personal grooming?: A Little Help from another person toileting, which includes using toliet, bedpan, or urinal?: Total Help from another person bathing (including washing, rinsing, drying)?: A Lot Help from another person to put on and taking off regular upper body clothing?: A Lot Help from another person to put on and taking off regular lower body clothing?: Total 6 Click Score: 13   End of Session Equipment Utilized During Treatment: Gait belt;Rolling walker (2 wheels) Nurse Communication: Mobility status;Other (comment);Patient requests pain meds;Precautions (nicotine  patch; potential use of pain patch)  Activity Tolerance: Patient limited by pain Patient left: in bed;with call bell/phone within reach;with bed alarm set  OT Visit Diagnosis: Unsteadiness on feet (R26.81);Muscle weakness (generalized) (M62.81);Other symptoms and signs involving cognitive function;History of falling (Z91.81);Pain Pain - part of body:  (back)                Time: 8483-8454 OT Time Calculation (min): 29 min Charges:  OT General Charges $OT Visit: 1 Visit OT Evaluation $OT Eval Moderate Complexity: 1 Mod  Kavaughn Faucett, OT/L   Acute OT Clinical Specialist Acute Rehabilitation Services Pager [phone] Office [phone]   [facility] 05/24/2024, 4:05 PM

## 2024-05-24 NOTE — Evaluation (Signed)
 Physical Therapy Evaluation Patient Details Name: David Townsend MRN: [id number] DOB: 10-19-1960 Today's Date: 05/24/2024  History of Present Illness  Pt is a 63 y.o. male presenting to [facility] ED for cancer-related pain and  multiple falls. Recently in  for same , DC 11/7. PMH significant for prostate cancer with spinal metastases, rib fx, anemia of chronic disease, GERD, HTN, HLD, cancer-related pain, tobacco use disorder and alcohol  use disorder. Imaging shows progression of diffuse bone metastasis to skull, ribs, spine, pelvis, humerus, and femurs.   Clinical Impression  Pt in bed upon arrival of PT, agreeable to evaluation at this time. Prior to admission the pt was ambulating with use of RW, reports no falls other than one prior to admission. However, pt is unreliable historian and no family is present. The pt was sitting EOB upon arrival, but required modA of 2 to complete sit-stand transfer and return to supine. Pt completed sit-stand x2 with RW and modA, poor tolerance for maintaining standing, and was unable to manage lateral step or standing marches due to 10/10 pain. The pt was then returned to supine where he reports pain returns to 0/10 once all movement is ceased. Given significant level of assist needed for all mobility, recommend continued skilled PT acutely and post-acte inpatient rehab <3hours/day.      If plan is discharge home, recommend the following: Two people to help with walking and/or transfers;Two people to help with bathing/dressing/bathroom;Assistance with cooking/housework;Direct supervision/assist for medications management;Direct supervision/assist for financial management;Assist for transportation;Help with stairs or ramp for entrance;Supervision due to cognitive status   Can travel by private vehicle   No    Equipment Recommendations None recommended by PT  Recommendations for Other Services       Functional Status Assessment Patient has had a recent decline in  their functional status and/or demonstrates limited ability to make significant improvements in function in a reasonable and predictable amount of time     Precautions / Restrictions Precautions Precautions: Back;Fall Precaution Booklet Issued: No Recall of Precautions/Restrictions: Impaired Precaution/Restrictions Comments: instructed in back precautions and lifting restrictions as related to ADLs and IADLs Required Braces or Orthoses: Spinal Brace Spinal Brace: Thoracolumbosacral orthotic Restrictions Weight Bearing Restrictions Per Provider Order: No Other Position/Activity Restrictions:  (Spinal precautions)      Mobility  Bed Mobility Overal bed mobility: Needs Assistance Bed Mobility: Sit to Sidelying, Rolling Rolling: Mod assist, Used rails, +2 for safety/equipment Sidelying to sit:  (sitting EOB on entry)     Sit to sidelying: Mod assist, Used rails, +2 for physical assistance General bed mobility comments: assist to trunk and LE, poor adherence to spinal precautions/log roll    Transfers Overall transfer level: Needs assistance Equipment used: Rolling walker (2 wheels) Transfers: Sit to/from Stand Sit to Stand: Mod assist, +2 physical assistance           General transfer comment: modA of 2 to rise to standing, no overt buckling but poor standing tolerance. unable to manage lateral step or marching in place    Ambulation/Gait               General Gait Details: pt unable to manage standing marches or lateral steps despite max cues, modA of 2 and lateral wt shift facilitation      Balance Overall balance assessment: Needs assistance Sitting-balance support: Feet supported Sitting balance-Leahy Scale: Fair     Standing balance support: No upper extremity supported Standing balance-Leahy Scale: Poor Standing balance comment: dependent on BUE support and modA  of 2, unable to step                             Pertinent Vitals/Pain Pain  Assessment Pain Assessment: 0-10 Pain Score: 10-Worst pain ever (when standing) Pain Location: back Pain Descriptors / Indicators: Grimacing, Crushing, Moaning Pain Intervention(s): Limited activity within patient's tolerance, Monitored during session, Premedicated before session, Repositioned    Home Living Family/patient expects to be discharged to:: Private residence Living Arrangements: Alone   Type of Home: House Home Access: Stairs to enter Entrance Stairs-Rails: Doctor, General Practice of Steps: 4   Home Layout: One level Home Equipment: Agricultural Consultant (2 wheels)      Prior Function Prior Level of Function : History of Falls (last six months);Driving             Mobility Comments: was  ambulating  w/ no device per pt (unsure of reliability) ADLs Comments: independent, 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     Extremity/Trunk Assessment   Upper Extremity Assessment Upper Extremity Assessment: Defer to OT evaluation    Lower Extremity Assessment Lower Extremity Assessment: Generalized weakness    Cervical / Trunk Assessment Cervical / Trunk Assessment: Other exceptions Cervical / Trunk Exceptions: spinal mets adn multiple fxs  Communication   Communication Communication: No apparent difficulties    Cognition Arousal: Alert Behavior During Therapy: WFL for tasks assessed/performed   PT - Cognitive impairments: No family/caregiver present to determine baseline, Orientation, Awareness, Initiation, Sequencing, Problem solving, Safety/Judgement   Orientation impairments: Time, Situation, Place                   PT - Cognition Comments: pt reports unable to recall the fall resulting in admission, believes month is january, and that he left rehab 1 month ago (d/c to rehab 20 days ago and only stayed 1 week). Pt with delay in all command following, asking for beer and  cigarette, then asking therapist to just grab one from the fridge despite stating that he is at [facility] Following commands: Impaired Following commands impaired: Follows one step commands with increased time     Cueing Cueing Techniques: Verbal cues, Visual cues     General Comments      Exercises Other Exercises Other Exercises: incentive spirometer x 5 - able to pull 1500 ml   Assessment/Plan    PT Assessment Patient needs continued PT services  PT Problem List Decreased strength;Decreased range of motion;Decreased activity tolerance;Decreased balance;Decreased mobility;Pain;Decreased safety awareness       PT Treatment Interventions DME instruction;Functional mobility training;Therapeutic activities;Therapeutic exercise;Patient/family education    PT Goals (Current goals can be found in the Care Plan section)  Acute Rehab PT Goals Patient Stated Goal: to reduce pain PT Goal Formulation: With patient Time For Goal Achievement: 06/07/24 Potential to Achieve Goals: Fair    Frequency Min 2X/week     Co-evaluation   Reason for Co-Treatment: Complexity of the patient's impairments (multi-system involvement);Necessary to address cognition/behavior during functional activity;For patient/therapist safety PT goals addressed during session: Mobility/safety with mobility OT goals addressed during session: ADL's and self-care       AM-PAC PT 6 Clicks Mobility  Outcome Measure Help needed turning from your back to your side while in a flat bed without using bedrails?: A Lot Help needed moving from lying on your back to sitting  on the side of a flat bed without using bedrails?: A Lot Help needed moving to and from a bed to a chair (including a wheelchair)?: Total Help needed standing up from a chair using your arms (e.g., wheelchair or bedside chair)?: A Lot Help needed to walk in hospital room?: Total Help needed climbing 3-5 steps with a railing? : Total 6  Click Score: 9    End of Session Equipment Utilized During Treatment: Gait belt;Back brace Activity Tolerance: Patient limited by pain Patient left: in bed;with call bell/phone within reach;with bed alarm set Nurse Communication: Mobility status (pt request for nicotine  patch) PT Visit Diagnosis: Unsteadiness on feet (R26.81);Pain;Muscle weakness (generalized) (M62.81) Pain - part of body:  (back)    Time: 8483-8455 PT Time Calculation (min) (ACUTE ONLY): 28 min   Charges:   PT Evaluation $PT Eval Moderate Complexity: 1 Mod   PT General Charges $$ ACUTE PT VISIT: 1 Visit         Izetta Call, PT, DPT   Acute Rehabilitation Department Office [phone] Secure Chat Communication Preferred  Izetta JULIANNA Call 05/24/2024, 4:51 PM

## 2024-05-25 ENCOUNTER — Inpatient Hospital Stay

## 2024-05-25 DIAGNOSIS — S32000G Wedge compression fracture of unspecified lumbar vertebra, subsequent encounter for fracture with delayed healing: Secondary | ICD-10-CM | POA: Diagnosis not present

## 2024-05-25 LAB — URINALYSIS, ROUTINE W REFLEX MICROSCOPIC
Bilirubin Urine: NEGATIVE
Glucose, UA: NEGATIVE mg/dL
Ketones, ur: 15 mg/dL — AB
Nitrite: POSITIVE — AB
Protein, ur: NEGATIVE mg/dL
Specific Gravity, Urine: 1.01 (ref 1.005–1.030)
pH: 6 (ref 5.0–8.0)

## 2024-05-25 LAB — CBC WITH DIFFERENTIAL/PLATELET
Abs Immature Granulocytes: 0.07 K/uL (ref 0.00–0.07)
Basophils Absolute: 0 K/uL (ref 0.0–0.1)
Basophils Relative: 0 %
Eosinophils Absolute: 0 K/uL (ref 0.0–0.5)
Eosinophils Relative: 0 %
HCT: 27.1 % — ABNORMAL LOW (ref 39.0–52.0)
Hemoglobin: 9.5 g/dL — ABNORMAL LOW (ref 13.0–17.0)
Immature Granulocytes: 1 %
Lymphocytes Relative: 5 %
Lymphs Abs: 0.3 K/uL — ABNORMAL LOW (ref 0.7–4.0)
MCH: 38 pg — ABNORMAL HIGH (ref 26.0–34.0)
MCHC: 35.1 g/dL (ref 30.0–36.0)
MCV: 108.4 fL — ABNORMAL HIGH (ref 80.0–100.0)
Monocytes Absolute: 0.2 K/uL (ref 0.1–1.0)
Monocytes Relative: 3 %
Neutro Abs: 6.4 K/uL (ref 1.7–7.7)
Neutrophils Relative %: 91 %
Platelets: 81 K/uL — ABNORMAL LOW (ref 150–400)
RBC: 2.5 MIL/uL — ABNORMAL LOW (ref 4.22–5.81)
RDW: 13.4 % (ref 11.5–15.5)
Smear Review: NORMAL
WBC: 7 K/uL (ref 4.0–10.5)
nRBC: 0 % (ref 0.0–0.2)

## 2024-05-25 LAB — URINALYSIS, MICROSCOPIC (REFLEX)

## 2024-05-25 LAB — RENAL FUNCTION PANEL
Albumin: 2.9 g/dL — ABNORMAL LOW (ref 3.5–5.0)
Anion gap: 13 (ref 5–15)
BUN: 7 mg/dL — ABNORMAL LOW (ref 8–23)
CO2: 24 mmol/L (ref 22–32)
Calcium: 8 mg/dL — ABNORMAL LOW (ref 8.9–10.3)
Chloride: 93 mmol/L — ABNORMAL LOW (ref 98–111)
Creatinine, Ser: 0.55 mg/dL — ABNORMAL LOW (ref 0.61–1.24)
GFR, Estimated: >60 mL/min
Glucose, Bld: 79 mg/dL (ref 70–99)
Phosphorus: 2.9 mg/dL (ref 2.5–4.6)
Potassium: 3 mmol/L — ABNORMAL LOW (ref 3.5–5.1)
Sodium: 130 mmol/L — ABNORMAL LOW (ref 135–145)

## 2024-05-25 LAB — MAGNESIUM: Magnesium: 1.3 mg/dL — ABNORMAL LOW (ref 1.7–2.4)

## 2024-05-25 MED ORDER — ALUM & MAG HYDROXIDE-SIMETH 200-200-20 MG/5ML PO SUSP
15.0000 mL | Freq: Four times a day (QID) | ORAL | Status: DC | PRN
  Administered 2024-05-25: 15 mL via ORAL
  Filled 2024-05-25: qty 30

## 2024-05-25 MED ORDER — GUAIFENESIN-DM 100-10 MG/5ML PO SYRP
5.0000 mL | ORAL_SOLUTION | ORAL | Status: DC | PRN
  Administered 2024-05-25 – 2024-05-26 (×3): 5 mL via ORAL
  Filled 2024-05-25 (×3): qty 10

## 2024-05-25 MED ORDER — POTASSIUM CHLORIDE 20 MEQ PO PACK
40.0000 meq | PACK | ORAL | Status: AC
  Administered 2024-05-25 – 2024-05-26 (×3): 40 meq via ORAL
  Filled 2024-05-25 (×3): qty 2

## 2024-05-25 MED ORDER — MAGNESIUM SULFATE 4 GM/100ML IV SOLN
4.0000 g | Freq: Once | INTRAVENOUS | Status: AC
  Administered 2024-05-25: 4 g via INTRAVENOUS
  Filled 2024-05-25: qty 100

## 2024-05-25 NOTE — Plan of Care (Signed)
  Problem: Education: Goal: Knowledge of General Education information will improve Description: Including pain rating scale, medication(s)/side effects and non-pharmacologic comfort measures Outcome: Progressing   Problem: Activity: Goal: Risk for activity intolerance will decrease Outcome: Progressing   Problem: Pain Managment: Goal: General experience of comfort will improve and/or be controlled Outcome: Progressing

## 2024-05-25 NOTE — TOC Transition Note (Signed)
 Transition of Care [facility]) - Discharge Note   Patient Details  Name: David Townsend MRN: [id number] Date of Birth: [birth year]  [facility] CM/SW Contact:  Bridget Cordella Simmonds, LCSW Phone Number: 05/25/2024, 12:12 PM   Clinical Narrative:   CSW met with pt regarding PT recommendation for SNF.  Pt from home with girlfriend [name], no current services.  Pt reports no current cancer treatments.  Pt is agreeable to SNF, permission given to send out referral in the hub.  Permission given to speak with [name] and with friend Lonni.    Referral sent out in hub for SNF.       Barriers to Discharge: Continued Medical Work up, SNF Pending bed offer   Patient Goals and CMS Choice Patient states their goals for this hospitalization and ongoing recovery are:: get back to where I was with walking          Discharge Placement                       Discharge Plan and Services Additional resources added to the After Visit Summary for   In-house Referral: Clinical Social Work   Post Acute Care Choice: Skilled Nursing Facility                               Social Drivers of Health (SDOH) Interventions SDOH Screenings   Food Insecurity: No Food Insecurity (05/23/2024)  Housing: Low Risk (05/23/2024)  Transportation Needs: No Transportation Needs (05/23/2024)  Utilities: Not At Risk (05/23/2024)  Alcohol  Screen: Medium Risk (12/04/2023)  Depression (PHQ2-9): Low Risk (12/04/2023)  Tobacco Use: High Risk (05/22/2024)     Readmission Risk Interventions    04/16/2024    1:08 PM  Readmission Risk Prevention Plan  Transportation Screening Complete  Medication Review (RN Care Manager) Referral to Pharmacy  PCP or Specialist appointment within 3-5 days of discharge Complete  HRI or Home Care Consult Complete  SW Recovery Care/Counseling Consult Complete  Palliative Care Screening Not Applicable  Skilled Nursing Facility Complete

## 2024-05-25 NOTE — [place] FL2 (Signed)
 " [place]  MEDICAID FL2 LEVEL OF CARE FORM     IDENTIFICATION  Patient Name: David Townsend Birthdate: [birth year] Sex: male Admission Date (Current Location): 05/22/2024  [facility] and [place] Number:  Producer, Television/film/video and Address:  [facility] [place], [address], [place], [place] [address]      Provider Number: [id number]  Attending Physician Name and Address:  Rosario Leatrice FERNS, MD  Relative Name and Phone Number:  Delphia [name] Benne   [phone]    Current Level of Care: Hospital Recommended Level of Care: Skilled Nursing Facility Prior Approval Number:    Date Approved/Denied:   PASRR Number: [id number]  Discharge Plan: SNF    Current Diagnoses: Patient Active Problem List   Diagnosis Date Noted   Vertebral compression fracture (HCC) 05/22/2024   Drug-induced constipation 04/26/2024   Prostate cancer metastatic to bone (HCC) 04/23/2024   Cancer associated pain 04/21/2024   Frequent falls 04/13/2024   Palliative care encounter 04/09/2024   Goals of care, counseling/discussion 04/09/2024   Medication management 04/09/2024   Counseling and coordination of care 04/09/2024   Secondary malignant neoplasm of bone (HCC) 04/09/2024   Metastatic carcinoma to bone (HCC) 04/09/2024   Chronic low back pain without sciatica 04/08/2024   Weakness 04/08/2024   Cancer-related pain 04/07/2024   Lobar pneumonia, unspecified organism 09/28/2022   ARDS (adult respiratory distress syndrome) (HCC) 09/28/2022   Rhabdomyolysis 09/19/2022   Hyponatremia 09/19/2022   Sepsis (HCC) 09/19/2022   Prostate cancer (HCC) 04/23/2022   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   Tobacco abuse 04/23/2022   Essential hypertension 04/23/2022   Urinary retention 04/23/2022   Hyperlipidemia 03/27/2022   ETOH abuse 11/01/2015    Orientation RESPIRATION BLADDER Height & Weight     Self, Time, Situation, Place  O2 Continent,  Indwelling catheter Weight: 137 lb 4.8 oz (62.3 kg) Height:  5' 10 (177.8 cm)  BEHAVIORAL SYMPTOMS/MOOD NEUROLOGICAL BOWEL NUTRITION STATUS      Continent Diet (see discharge summary)  AMBULATORY STATUS COMMUNICATION OF NEEDS Skin   Extensive Assist Verbally Normal                       Personal Care Assistance Level of Assistance  Bathing, Feeding, Dressing, Total care Bathing Assistance: Maximum assistance Feeding assistance: Limited assistance Dressing Assistance: Maximum assistance Total Care Assistance: Maximum assistance   Functional Limitations Info  Sight, Hearing, Speech Sight Info: Adequate Hearing Info: Adequate Speech Info: Adequate    SPECIAL CARE FACTORS FREQUENCY  PT (By licensed PT), OT (By licensed OT)     PT Frequency: 5x week OT Frequency: 5x week            Contractures Contractures Info: Not present    Additional Factors Info  Code Status, Allergies Code Status Info: full Allergies Info: NKA           Current Medications (05/25/2024):  This is the current hospital active medication list Current Facility-Administered Medications  Medication Dose Route Frequency Provider Last Rate Last Admin   acetaminophen  (TYLENOL ) tablet 650 mg  650 mg Oral Q6H PRN Arthea Child, MD       Or   acetaminophen  (TYLENOL ) suppository 650 mg  650 mg Rectal Q6H PRN Arthea Child, MD       bisacodyl  (DULCOLAX) EC tablet 5 mg  5 mg Oral Daily PRN Arthea Child, MD       cefTRIAXone  (ROCEPHIN ) 1 g in sodium  chloride 0.9 % 100 mL IVPB  1 g Intravenous Q24H Rosario Eland I, MD 200 mL/hr at 05/24/24 1254 1 g at 05/24/24 1254   Chlorhexidine  Gluconate Cloth 2 % PADS 6 each  6 each Topical Daily Rosario Eland I, MD   6 each at 05/25/24 1004   folic acid  (FOLVITE ) tablet 1 mg  1 mg Oral Daily Claiborne, Claudia, MD   1 mg at 05/25/24 1003   guaiFENesin -dextromethorphan  (ROBITUSSIN DM) 100-10 MG/5ML syrup 5 mL  5 mL Oral Q4H PRN Rosario Eland  I, MD   5 mL at 05/25/24 0129   lidocaine  (LIDODERM ) 5 % 1 patch  1 patch Transdermal Q24H Rosario Eland I, MD       magnesium  citrate solution 1 Bottle  1 Bottle Oral Once PRN Claiborne, Claudia, MD       morphine  (MS CONTIN ) 12 hr tablet 60 mg  60 mg Oral Q12H Claiborne, Claudia, MD   60 mg at 05/25/24 0457   morphine  (MSIR) tablet 15 mg  15 mg Oral Q4H PRN Claiborne, Claudia, MD   15 mg at 05/25/24 1002   multivitamin with minerals tablet 1 tablet  1 tablet Oral Daily Claiborne, Claudia, MD   1 tablet at 05/25/24 1003   nicotine  (NICODERM CQ  - dosed in mg/24 hours) patch 14 mg  14 mg Transdermal Daily Ogbata, Sylvester I, MD   14 mg at 05/25/24 1005   ondansetron  (ZOFRAN ) tablet 4 mg  4 mg Oral Q6H PRN Arthea Child, MD       Or   ondansetron  (ZOFRAN ) injection 4 mg  4 mg Intravenous Q6H PRN Arthea Child, MD       sodium chloride  flush (NS) 0.9 % injection 3 mL  3 mL Intravenous Q12H Arthea Child, MD   3 mL at 05/25/24 1005   thiamine  (VITAMIN B1) tablet 100 mg  100 mg Oral Daily Claiborne, Claudia, MD   100 mg at 05/25/24 1003   Or   thiamine  (VITAMIN B1) injection 100 mg  100 mg Intravenous Daily Claiborne, Claudia, MD         Discharge Medications: Please see discharge summary for a list of discharge medications.  Relevant Imaging Results:  Relevant Lab Results:   Additional Information SSN: [id number]  Bridget Cordella Simmonds, LCSW     "

## 2024-05-25 NOTE — Progress Notes (Signed)
 Mobility Specialist Progress Note:    05/25/24 1133  Mobility  Activity Pivoted/transferred from chair to bed  Level of Assistance Minimal assist, patient does 75% or more (+2)  Assistive Device Front wheel walker  Distance Ambulated (ft) 8 ft  Activity Response Tolerated well  Mobility Referral Yes  Mobility visit 1 Mobility  Mobility Specialist Start Time (ACUTE ONLY) 1122  Mobility Specialist Stop Time (ACUTE ONLY) 1133  Mobility Specialist Time Calculation (min) (ACUTE ONLY) 11 min   After transferring pt to chair, pt requested MS to return 15 minutes later to transfer to bed d/t potential pain. Received pt in chair and agreeable to transfer. Pt required MinA +2 STS w/ RW management. Pt c/o back pain, otherwise tolerated well. Left pt in bed with alarm on. Personal belongings and call light within reach. All needs met.  Lavanda Pollack Mobility Specialist  Please contact via [employer] or  Rehab Office [phone]

## 2024-05-25 NOTE — Progress Notes (Signed)
 " PROGRESS NOTE    David Townsend  [name] DOB: Oct 30, 1960 DOA: 05/22/2024 PCP: Jaycee Greig PARAS, NP  Outpatient Specialists:     Brief Narrative:  Patient is a 63 year old male past medical history significant for metastatic prostate cancer (mets to the bone), history of penile cancer, anemia, hypertension and hyperlipidemia.  Patient was admitted with fall, with resultant multiple vertebral compression fracture and pain.  CT lumbar spine revealed widespread lucent and sclerotic metastatic disease throughout the thoracic and lumbar spine.  Interval development of compression fractures of T10 and T12, dorsal epidural lipomatosis with mass effect on the thecal sac/cord which are displaced anteriorly, most notable from T2-T3 through T8-T9 level.  As per neurosurgery team, patient is not a surgical candidate.  UA revealed large leukocyte esterase, positive nitrite and many bacteria.  Specific gravity was 1.031.  Serum creatinine of 1.21 on presentation, but down to 0.86.  05/23/2024: Continue to manage supportively.  Control pain.  Hydrate patient, managed supportively.  No new complaints today.  05/25/2024: Patient seen.  Pain is controlled.  No new changes.  PT/OT have recommended SNF.  Awaiting disposition.   Assessment & Plan:   Principal Problem:   Vertebral compression fracture (HCC) Active Problems:   ETOH abuse   Anemia in neoplastic disease   Prostate cancer metastatic to bone (HCC)   Drug-induced constipation   Multiple vertebral compression fractures post fall in patient with prostate cancer with known bone mets. - Neurosurgery, PA Jennetta was called by the emergency department.  They recommended brace and stated he is not a candidate for surgery at this time. - Pain control.  As patient is already on MS Contin .  Will just increase his short acting pain medication for now and evaluate. - Consider palliative care involvement to help with symptom management - Awaiting a brace  as recommended by neurosurgery - Consider a dose of Decadron  for mass effect on the thecal sac - Continue home pain meds of MS contin  plus  MS IR for breakthrough 05/23/2024: Continue to manage supportively.  Adequate pain control. 05/25/2024: SNF is recommended.  Pursue disposition.   Constipation: - Patient has been on opiates. - Continue MiraLAX  trial. - 1 dose of Relistor  given.   AKI: - Resolved. - Continue to monitor renal function and electrolytes. - Adequate hydration.   Hypokalemia: - Potassium of 3 today. - Replete.  Continue to monitor and replete.   Hypomagnesemia: - Magnesium  of 1.3 today. - IV magnesium  4 g x 1 dose. - Renal panel and magnesium  level in the morning.  Elevated alk phos and ALT: - Likely due to cancer with bone mets.    Anemia: - Last hemoglobin was 9.5 g/dL.   - Continue to be monitor closely.  Thrombocytopenia: - Platelet count of 81 today. - Continue to monitor closely.   - Low threshold to check HIT antibody.  Possible UTI versus colonization: - No urine culture visualized.    -UA is positive for nitrite, large leukocyte esterase and many bacteria. - Completed course of IV Rocephin .   DVT prophylaxis: SCD. Code Status: Full code Family Communication:  Disposition Plan:    Consultants:  As per collateral information, and neurosurgery team has deemed patient not an ideal surgical candidate.  Procedures:  None.  Antimicrobials:  IV ceftriaxone .   Subjective: No complaints.  Objective: Vitals:   05/24/24 1457 05/25/24 0000 05/25/24 0726 05/25/24 1356  BP: 137/78 133/80 116/62 (!) 141/62  Pulse: 100 90 96 99  Resp: 16 18 20  18  Temp: (!) 100.9 F (38.3 C) 99.8 F (37.7 C) 99 F (37.2 C) 97.8 F (36.6 C)  TempSrc:  Oral    SpO2: 96% 94% 94% 91%  Weight:      Height:        Intake/Output Summary (Last 24 hours) at 05/25/2024 1735 Last data filed at 05/25/2024 0100 Gross per 24 hour  Intake --  Output 650 ml   Net -650 ml   Filed Weights   05/22/24 1652 05/23/24 0600  Weight: 63 kg 62.3 kg    Examination:  General exam: Appears calm and comfortable  Respiratory system: Clear to auscultation.  Cardiovascular system: S1 & S2 heard Gastrointestinal system: Soft and nontender.   Central nervous system: Alert and oriented.    Data Reviewed: I have personally reviewed following labs and imaging studies  CBC: Recent Labs  Lab 05/22/24 1825 05/23/24 0530 05/25/24 0340  WBC 7.5 9.2 7.0  NEUTROABS 6.8  --  6.4  HGB 8.9* 9.3* 9.5*  HCT 26.0* 27.2* 27.1*  MCV 111.6* 111.5* 108.4*  PLT 115* 106* 81*   Basic Metabolic Panel: Recent Labs  Lab 05/22/24 1825 05/23/24 0530 05/25/24 0340  NA 132* 134* 130*  K 3.1* 3.7 3.0*  CL 97* 99 93*  CO2 23 23 24   GLUCOSE 86 93 79  BUN 20 15 7*  CREATININE 1.31* 0.86 0.55*  CALCIUM  8.3* 8.6* 8.0*  MG  --  2.1 1.3*  PHOS  --   --  2.9   GFR: Estimated Creatinine Clearance: 83.3 mL/min (A) (by C-G formula based on SCr of 0.55 mg/dL (L)). Liver Function Tests: Recent Labs  Lab 05/22/24 1825 05/23/24 0530 05/25/24 0340  AST 91* 124*  --   ALT 24 33  --   ALKPHOS 172* 174*  --   BILITOT 0.2 <0.2  --   PROT 5.4* 5.4*  --   ALBUMIN 3.3* 3.2* 2.9*   No results for input(s): LIPASE, AMYLASE in the last 168 hours. No results for input(s): AMMONIA in the last 168 hours. Coagulation Profile: Recent Labs  Lab 05/22/24 1825  INR 1.0   Cardiac Enzymes: No results for input(s): CKTOTAL, CKMB, CKMBINDEX, TROPONINI in the last 168 hours. BNP (last 3 results) No results for input(s): PROBNP in the last 8760 hours. HbA1C: No results for input(s): HGBA1C in the last 72 hours. CBG: Recent Labs  Lab 05/23/24 0644  GLUCAP 106*   Lipid Profile: No results for input(s): CHOL, HDL, LDLCALC, TRIG, CHOLHDL, LDLDIRECT in the last 72 hours. Thyroid  Function Tests: No results for input(s): TSH, T4TOTAL, FREET4,  T3FREE, THYROIDAB in the last 72 hours. Anemia Panel: No results for input(s): VITAMINB12, FOLATE, FERRITIN, TIBC, IRON , RETICCTPCT in the last 72 hours. Urine analysis:    Component Value Date/Time   COLORURINE YELLOW 05/25/2024 0136   APPEARANCEUR HAZY (A) 05/25/2024 0136   LABSPEC 1.010 05/25/2024 0136   PHURINE 6.0 05/25/2024 0136   GLUCOSEU NEGATIVE 05/25/2024 0136   HGBUR SMALL (A) 05/25/2024 0136   BILIRUBINUR NEGATIVE 05/25/2024 0136   BILIRUBINUR negative 08/12/2019 1130   KETONESUR 15 (A) 05/25/2024 0136   PROTEINUR NEGATIVE 05/25/2024 0136   UROBILINOGEN 0.2 08/12/2019 1130   NITRITE POSITIVE (A) 05/25/2024 0136   LEUKOCYTESUR SMALL (A) 05/25/2024 0136   Sepsis Labs: @LABRCNTIP (procalcitonin:4,lacticidven:4)  ) Recent Results (from the past 240 hours)  Culture, blood (routine x 2)     Status: None (Preliminary result)   Collection Time: 05/22/24 10:30 PM   Specimen:  BLOOD  Result Value Ref Range Status   Specimen Description   Final    BLOOD RIGHT ANTECUBITAL Performed at [facility], [address] [place], [place] [address]    Special Requests   Final    BOTTLES DRAWN AEROBIC AND ANAEROBIC Blood Culture adequate volume Performed at [facility], [address] [place], [place] [address]    Culture   Final    NO GROWTH 2 DAYS Performed at [facility], [address], [place], [place] [address]    Report Status PENDING  Incomplete  Culture, blood (routine x 2)     Status: None (Preliminary result)   Collection Time: 05/22/24 10:39 PM   Specimen: BLOOD LEFT FOREARM  Result Value Ref Range Status   Specimen Description   Final    BLOOD LEFT FOREARM Performed at [facility], [address], [place], [place] [address]    Special Requests   Final    BOTTLES DRAWN AEROBIC AND ANAEROBIC Blood Culture results may not be optimal due to an inadequate volume of blood received in culture  bottles Performed at [facility], [address] [place], [place] [address]    Culture   Final    NO GROWTH 2 DAYS Performed at [facility], [address], [place], [place] [address]    Report Status PENDING  Incomplete         Radiology Studies: No results found.       Scheduled Meds:  Chlorhexidine  Gluconate Cloth  6 each Topical Daily   folic acid   1 mg Oral Daily   lidocaine   1 patch Transdermal Q24H   morphine   60 mg Oral Q12H   multivitamin with minerals  1 tablet Oral Daily   nicotine   14 mg Transdermal Daily   sodium chloride  flush  3 mL Intravenous Q12H   thiamine   100 mg Oral Daily   Or   thiamine   100 mg Intravenous Daily   Continuous Infusions:  cefTRIAXone  (ROCEPHIN )  IV 1 g (05/25/24 1341)     LOS: 3 days    Time spent: 35 minutes.    Leatrice Chapel, MD  [facility] Pager #: [phone] 7PM-7AM contact night coverage as above    "

## 2024-05-25 NOTE — Progress Notes (Signed)
 Transition of Care [facility]) - CAGE-AID Screening   Patient Details  Name: David Townsend MRN: [id number] Date of Birth: 07/01/1960  [facility]) CM/SW Contact:    Sallyanne MALVA Mettle, RN Phone Number: 05/25/2024, 10:20 PM   Clinical Narrative: Pt reports 5-6 beers daily, states he doesn't experience withdrawal s/s when he misses a day. Resources offered, pt declines. TOC consult for substance abuse already ordered, CIWA ordered.    CAGE-AID Screening:    Have You Ever Felt You Ought to Cut Down on Your Drinking or Drug Use?: No Have People Annoyed You By Critizing Your Drinking Or Drug Use?: No Have You Felt Bad Or Guilty About Your Drinking Or Drug Use?: No Have You Ever Had a Drink or Used Drugs First Thing In The Morning to Steady Your Nerves or to Get Rid of a Hangover?: No CAGE-AID Score: 0  Substance Abuse Education Offered: Yes

## 2024-05-26 DIAGNOSIS — G893 Neoplasm related pain (acute) (chronic): Secondary | ICD-10-CM

## 2024-05-26 DIAGNOSIS — Z7189 Other specified counseling: Secondary | ICD-10-CM

## 2024-05-26 DIAGNOSIS — K5903 Drug induced constipation: Secondary | ICD-10-CM

## 2024-05-26 DIAGNOSIS — S32000A Wedge compression fracture of unspecified lumbar vertebra, initial encounter for closed fracture: Secondary | ICD-10-CM

## 2024-05-26 DIAGNOSIS — S22000A Wedge compression fracture of unspecified thoracic vertebra, initial encounter for closed fracture: Secondary | ICD-10-CM | POA: Diagnosis not present

## 2024-05-26 DIAGNOSIS — F1010 Alcohol abuse, uncomplicated: Secondary | ICD-10-CM

## 2024-05-26 DIAGNOSIS — Z515 Encounter for palliative care: Secondary | ICD-10-CM

## 2024-05-26 LAB — RENAL FUNCTION PANEL
Albumin: 3 g/dL — ABNORMAL LOW (ref 3.5–5.0)
Anion gap: 12 (ref 5–15)
BUN: 5 mg/dL — ABNORMAL LOW (ref 8–23)
CO2: 24 mmol/L (ref 22–32)
Calcium: 8.3 mg/dL — ABNORMAL LOW (ref 8.9–10.3)
Chloride: 91 mmol/L — ABNORMAL LOW (ref 98–111)
Creatinine, Ser: 0.48 mg/dL — ABNORMAL LOW (ref 0.61–1.24)
GFR, Estimated: >60 mL/min
Glucose, Bld: 90 mg/dL (ref 70–99)
Phosphorus: 2.6 mg/dL (ref 2.5–4.6)
Potassium: 3.1 mmol/L — ABNORMAL LOW (ref 3.5–5.1)
Sodium: 127 mmol/L — ABNORMAL LOW (ref 135–145)

## 2024-05-26 LAB — CBC WITH DIFFERENTIAL/PLATELET
Abs Immature Granulocytes: 0.03 K/uL (ref 0.00–0.07)
Basophils Absolute: 0 K/uL (ref 0.0–0.1)
Basophils Relative: 0 %
Eosinophils Absolute: 0 K/uL (ref 0.0–0.5)
Eosinophils Relative: 0 %
HCT: 26 % — ABNORMAL LOW (ref 39.0–52.0)
Hemoglobin: 9.3 g/dL — ABNORMAL LOW (ref 13.0–17.0)
Immature Granulocytes: 1 %
Lymphocytes Relative: 9 %
Lymphs Abs: 0.4 K/uL — ABNORMAL LOW (ref 0.7–4.0)
MCH: 37.8 pg — ABNORMAL HIGH (ref 26.0–34.0)
MCHC: 35.8 g/dL (ref 30.0–36.0)
MCV: 105.7 fL — ABNORMAL HIGH (ref 80.0–100.0)
Monocytes Absolute: 0.3 K/uL (ref 0.1–1.0)
Monocytes Relative: 5 %
Neutro Abs: 4.3 K/uL (ref 1.7–7.7)
Neutrophils Relative %: 85 %
Platelets: 70 K/uL — ABNORMAL LOW (ref 150–400)
RBC: 2.46 MIL/uL — ABNORMAL LOW (ref 4.22–5.81)
RDW: 13.2 % (ref 11.5–15.5)
WBC: 5.1 K/uL (ref 4.0–10.5)
nRBC: 0 % (ref 0.0–0.2)

## 2024-05-26 LAB — MAGNESIUM: Magnesium: 2.2 mg/dL (ref 1.7–2.4)

## 2024-05-26 MED ORDER — POTASSIUM CHLORIDE CRYS ER 20 MEQ PO TBCR
40.0000 meq | EXTENDED_RELEASE_TABLET | ORAL | Status: AC
  Administered 2024-05-26 (×2): 40 meq via ORAL
  Filled 2024-05-26 (×2): qty 2

## 2024-05-26 MED ORDER — SODIUM CHLORIDE 1 G PO TABS
1.0000 g | ORAL_TABLET | Freq: Three times a day (TID) | ORAL | Status: DC
  Administered 2024-05-26 – 2024-05-27 (×4): 1 g via ORAL
  Filled 2024-05-26 (×4): qty 1

## 2024-05-26 MED ORDER — SENNOSIDES-DOCUSATE SODIUM 8.6-50 MG PO TABS
1.0000 | ORAL_TABLET | Freq: Two times a day (BID) | ORAL | Status: DC
  Administered 2024-05-26 – 2024-05-27 (×3): 1 via ORAL
  Filled 2024-05-26 (×3): qty 1

## 2024-05-26 MED ORDER — MORPHINE SULFATE (PF) 2 MG/ML IV SOLN: 2.0000 mg | INTRAVENOUS | Status: DC | PRN

## 2024-05-26 MED ORDER — DEXAMETHASONE 4 MG PO TABS
4.0000 mg | ORAL_TABLET | Freq: Every day | ORAL | Status: DC
  Administered 2024-05-26 – 2024-05-27 (×2): 4 mg via ORAL
  Filled 2024-05-26 (×2): qty 1

## 2024-05-26 MED ORDER — POLYETHYLENE GLYCOL 3350 17 G PO PACK
17.0000 g | PACK | Freq: Every day | ORAL | Status: DC
  Administered 2024-05-26: 17 g via ORAL

## 2024-05-26 MED ORDER — BISACODYL 10 MG RE SUPP: 10.0000 mg | Freq: Every day | RECTAL | Status: DC | PRN

## 2024-05-26 MED ORDER — METHOCARBAMOL 500 MG PO TABS
500.0000 mg | ORAL_TABLET | Freq: Three times a day (TID) | ORAL | Status: DC | PRN
  Administered 2024-05-26 – 2024-05-27 (×2): 500 mg via ORAL
  Filled 2024-05-26 (×3): qty 1

## 2024-05-26 NOTE — Progress Notes (Signed)
 " PROGRESS NOTE    David Townsend  [name] DOB: 1961-05-25 DOA: 05/22/2024 PCP: Jaycee Greig PARAS, NP   Brief Narrative:  63 year old male past medical history significant for metastatic prostate cancer (mets to the bone), history of penile cancer, anemia, hypertension and hyperlipidemia was admitted with fall, with resultant multiple vertebral compression fracture and pain. CT lumbar spine revealed widespread lucent and sclerotic metastatic disease throughout the thoracic and lumbar spine. Interval development of compression fractures of T10 and T12, dorsal epidural lipomatosis with mass effect on the thecal sac/cord which are displaced anteriorly, most notable from T2-T3 through T8-T9 level. As per neurosurgery team, patient is not a surgical candidate.  UA was suggestive of UTI: He was started on IV antibiotics.  PT recommended SNF placement.  TOC consulted.  Assessment & Plan:   Multiple vertebral compression fractures status post fall, patient with prostate cancer with known bony mets - Imaging as above. As per neurosurgery team, patient is not a surgical candidate.  Neurosurgery recommended brace -Continue current pain management.  Continue bowel management -PT recommended SNF placement.  TOC consulted. - Consult palliative care for goals of care discussion and to help with pain management  Probable UTI: Present on admission - Completed treatment with Rocephin   Constipation -Add bowel regimen  AKI - Resolved  Hyponatremia - Sodium 127 today.  Start salt tabs.  Monitor  Hypokalemia - Replace.  Repeat a.m. labs  Hypomagnesemia - Resolved  Anemia of chronic disease Macrocytosis - From chronic illnesses.  Hemoglobin stable.  Monitor intermittently.  No signs of bleeding  Thrombocytopenia -Question cause.  Platelets trending downwards.  Monitor.  No signs of bleeding     DVT prophylaxis: SCDs Code Status: Full Family Communication: None at bedside Disposition  Plan: Status is: Inpatient Remains inpatient appropriate because: Of severity of illness    Consultants: Neurosurgery consulted by ED provider.  Consult palliative care  Procedures: None  Antimicrobials: None   Subjective: Patient seen and examined at bedside.  Slow to respond, poor historian.  No fever, seizures or agitation reported.  Objective: Vitals:   05/25/24 1356 05/25/24 1941 05/26/24 0417 05/26/24 0747  BP: (!) 141/62 117/75 131/72 100/69  Pulse: 99 90 91 84  Resp: 18 18 17 16   Temp: 97.8 F (36.6 C) 98.8 F (37.1 C) 98.5 F (36.9 C) 98.3 F (36.8 C)  TempSrc:      SpO2: 91% 91% 99% 94%  Weight:      Height:        Intake/Output Summary (Last 24 hours) at 05/26/2024 1006 Last data filed at 05/26/2024 0200 Gross per 24 hour  Intake 60 ml  Output 1550 ml  Net -1490 ml   Filed Weights   05/22/24 1652 05/23/24 0600  Weight: 63 kg 62.3 kg    Examination:  General exam: Appears calm and comfortable.  Chronically ill and deconditioned appearing Respiratory system: Bilateral decreased breath sounds at bases with scattered crackles Cardiovascular system: S1 & S2 heard, Rate controlled Gastrointestinal system: Abdomen is nondistended, soft and nontender. Normal bowel sounds heard. Extremities: No cyanosis, clubbing, edema  Central nervous system: Awake, extremely slow to respond.  Poor historian.  No focal neurological deficits. Moving extremities Skin: No rashes, lesions or ulcers Psychiatry: Flat affect.  Not agitated.  Data Reviewed: I have personally reviewed following labs and imaging studies  CBC: Recent Labs  Lab 05/22/24 1825 05/23/24 0530 05/25/24 0340 05/26/24 0439  WBC 7.5 9.2 7.0 5.1  NEUTROABS 6.8  --  6.4 4.3  HGB 8.9* 9.3* 9.5* 9.3*  HCT 26.0* 27.2* 27.1* 26.0*  MCV 111.6* 111.5* 108.4* 105.7*  PLT 115* 106* 81* 70*   Basic Metabolic Panel: Recent Labs  Lab 05/22/24 1825 05/23/24 0530 05/25/24 0340 05/26/24 0439  NA 132*  134* 130* 127*  K 3.1* 3.7 3.0* 3.1*  CL 97* 99 93* 91*  CO2 23 23 24 24   GLUCOSE 86 93 79 90  BUN 20 15 7* 5*  CREATININE 1.31* 0.86 0.55* 0.48*  CALCIUM  8.3* 8.6* 8.0* 8.3*  MG  --  2.1 1.3* 2.2  PHOS  --   --  2.9 2.6   GFR: Estimated Creatinine Clearance: 83.3 mL/min (A) (by C-G formula based on SCr of 0.48 mg/dL (L)). Liver Function Tests: Recent Labs  Lab 05/22/24 1825 05/23/24 0530 05/25/24 0340 05/26/24 0439  AST 91* 124*  --   --   ALT 24 33  --   --   ALKPHOS 172* 174*  --   --   BILITOT 0.2 <0.2  --   --   PROT 5.4* 5.4*  --   --   ALBUMIN 3.3* 3.2* 2.9* 3.0*   No results for input(s): LIPASE, AMYLASE in the last 168 hours. No results for input(s): AMMONIA in the last 168 hours. Coagulation Profile: Recent Labs  Lab 05/22/24 1825  INR 1.0   Cardiac Enzymes: No results for input(s): CKTOTAL, CKMB, CKMBINDEX, TROPONINI in the last 168 hours. BNP (last 3 results) No results for input(s): PROBNP in the last 8760 hours. HbA1C: No results for input(s): HGBA1C in the last 72 hours. CBG: Recent Labs  Lab 05/23/24 0644  GLUCAP 106*   Lipid Profile: No results for input(s): CHOL, HDL, LDLCALC, TRIG, CHOLHDL, LDLDIRECT in the last 72 hours. Thyroid  Function Tests: No results for input(s): TSH, T4TOTAL, FREET4, T3FREE, THYROIDAB in the last 72 hours. Anemia Panel: No results for input(s): VITAMINB12, FOLATE, FERRITIN, TIBC, IRON , RETICCTPCT in the last 72 hours. Sepsis Labs: Recent Labs  Lab 05/22/24 1837  LATICACIDVEN 1.1    Recent Results (from the past 240 hours)  Culture, blood (routine x 2)     Status: None (Preliminary result)   Collection Time: 05/22/24 10:30 PM   Specimen: BLOOD  Result Value Ref Range Status   Specimen Description   Final    BLOOD RIGHT ANTECUBITAL Performed at [facility], [address], [place], [place] [address]    Special Requests   Final     BOTTLES DRAWN AEROBIC AND ANAEROBIC Blood Culture adequate volume Performed at [facility], [address] [place], [place] [address]    Culture   Final    NO GROWTH 3 DAYS Performed at [facility], [address] [place], [place] [address]    Report Status PENDING  Incomplete  Culture, blood (routine x 2)     Status: None (Preliminary result)   Collection Time: 05/22/24 10:39 PM   Specimen: BLOOD LEFT FOREARM  Result Value Ref Range Status   Specimen Description   Final    BLOOD LEFT FOREARM Performed at [facility], [address], [place], [place] [address]    Special Requests   Final    BOTTLES DRAWN AEROBIC AND ANAEROBIC Blood Culture results may not be optimal due to an inadequate volume of blood received in culture bottles Performed at [facility], [address], [place], [place] [address]    Culture   Final    NO  GROWTH 3 DAYS Performed at [facility] [place], [address] [place], [place] [address]    Report Status PENDING  Incomplete         Radiology Studies: No results found.      Scheduled Meds:  Chlorhexidine  Gluconate Cloth  6 each Topical Daily   folic acid   1 mg Oral Daily   lidocaine   1 patch Transdermal Q24H   morphine   60 mg Oral Q12H   multivitamin with minerals  1 tablet Oral Daily   nicotine   14 mg Transdermal Daily   sodium chloride  flush  3 mL Intravenous Q12H   thiamine   100 mg Oral Daily   Or   thiamine   100 mg Intravenous Daily   Continuous Infusions:  cefTRIAXone  (ROCEPHIN )  IV 1 g (05/25/24 1341)          Sophie Mao, MD [facility] 05/26/2024, 10:06 AM   "

## 2024-05-26 NOTE — Progress Notes (Signed)
 Mobility Specialist Progress Note:   05/26/24 1042  Mobility  Activity Pivoted/transferred from bed to chair  Level of Assistance Minimal assist, patient does 75% or more (+2)  Assistive Device Front wheel walker  Distance Ambulated (ft) 5 ft  Activity Response Tolerated well  Mobility Referral Yes  Mobility visit 1 Mobility  Mobility Specialist Start Time (ACUTE ONLY) 1039  Mobility Specialist Stop Time (ACUTE ONLY) 1050  Mobility Specialist Time Calculation (min) (ACUTE ONLY) 11 min   Received pt in BED and agreeable to transfer. Pt required MinA +2 STS. Pt c/o back pain, otherwise tolerated well. Left pt in chair with alarm on. Personal belongings and call light within reach. All needs met.  Lavanda Pollack Mobility Specialist  Please contact via [employer] or  Rehab Office [phone]

## 2024-05-26 NOTE — TOC Progression Note (Signed)
 Transition of Care [facility]) - Progression Note    Patient Details  Name: David Townsend MRN: [id number] Date of Birth: 09-28-1960  Transition of Care [facility]) CM/SW Contact  Almarie CHRISTELLA Goodie, [place] Phone Number: 05/26/2024, 11:27 AM  Clinical Narrative:   CSW met with patient to provide bed offers for SNF. Patient said he has been at [facility] before would like to return. CSW contacted [facility] to accept bed offer and ask them to start insurance authorization. CSW to follow.    Expected Discharge Plan: Skilled Nursing Facility Barriers to Discharge: English As A Second Language Teacher, Continued Medical Work up               Expected Discharge Plan and Services In-house Referral: Clinical Social Work   Post Acute Care Choice: Skilled Nursing Facility Living arrangements for the past 2 months: Single Family Home                                       Social Drivers of Health (SDOH) Interventions SDOH Screenings   Food Insecurity: No Food Insecurity (05/23/2024)  Housing: Low Risk (05/23/2024)  Transportation Needs: No Transportation Needs (05/23/2024)  Utilities: Not At Risk (05/23/2024)  Alcohol  Screen: Medium Risk (12/04/2023)  Depression (PHQ2-9): Low Risk (12/04/2023)  Tobacco Use: High Risk (05/22/2024)    Readmission Risk Interventions    04/16/2024    1:08 PM  Readmission Risk Prevention Plan  Transportation Screening Complete  Medication Review (RN Care Manager) Referral to Pharmacy  PCP or Specialist appointment within 3-5 days of discharge Complete  HRI or Home Care Consult Complete  SW Recovery Care/Counseling Consult Complete  Palliative Care Screening Not Applicable  Skilled Nursing Facility Complete

## 2024-05-26 NOTE — Consult Note (Signed)
 "                              Consultation Note Date: 05/26/2024   Patient Name: David Townsend  DOB: 02/02/1961  MRN: [id number]  Age / Sex: 63 y.o., male  PCP: Jaycee Greig PARAS, NP Referring Physician: Cheryle Page, MD  Reason for Consultation: Establishing goals of care and Pain control  HPI/Patient Profile: 63 y.o. male  with past medical history of prostate cancer with widespread bone mets, penile cancer, anemia, hyperlipidemia, htn, admitted on 05/22/2024 with severe back pain and fall.   In the ED, patient had imaging which showed new left femoral intertrochanteric lesion on x-ray concerning for progressive metastatic disease.  Also had CT chest abdomen pelvis which showed progressive metastatic disease in the spine with multiple pathologic compression fractures, some left lower lobe consolidation, thickening of bladder wall with question of cystitis, irregular prostate.  Neurosurgery was consulted given finding of dorsal epidural lipomatosis and patient was deemed to not be a surgical candidate.  He is admitted for pain control due to acute vertebral compression fractures, as well as AKI and potential UTI for which he was started on IV antibiotics.  PMT has been consulted to assist with pain control and goals of care conversation.  Clinical Assessment and Goals of Care:  I have reviewed medical records including EPIC notes from this hospitalization, outpatient palliative care note, labs and imaging, MAR, assessed the patient and then met at the bedside to discuss diagnosis prognosis, GOC, EOL wishes, disposition and options.  Patient has hyponatremia with sodium of 127, down from 130 yesterday.  Potassium at 3.1.  I introduced Palliative Medicine as specialized medical care for people living with serious illness. It focuses on providing relief from the symptoms and stress of a serious illness. The goal is to improve quality of life for both the patient and the family.  We discussed a  brief life review of the patient and then focused on their current illness.   I attempted to elicit values and goals of care important to the patient.    Medical History Review and Understanding:  We discussed patient's acute illness in the context of their chronic comorbidities.  Patient was unaware of current treatment for UTI and thinks he has not received antibiotics yet.  Social History: Patient lives with his significant other.  Functional and Nutritional State: Albumin of 3.0 12/23.  Palliative Symptoms: Severe back pain, not rated though described as bad Constipation Endorses emotional distress though falls asleep before clarifying  Advance Directives: Reviewed documentation currently on file from 04/08/2024.  Patient's primary HCPOA is [employer], his significant other.  Secondary HCPOA/friend Christopher.  [employer] indicates desire for all life-prolonging measures available and to live as long as possible.  Discussion: Patient is minimally conversing today.  He recalls meeting my colleague in the outpatient palliative care clinic last week.  He is able to recount his fall and findings of new compression fractures in addition to older wounds that were present already.  Symptom management strategies were discussed with him including steroids for bone pain, IV morphine  as needed.  He was agreeable.  He tells me he did not run the last time he had a bowel movement.  He is not doing good emotionally, but falls asleep before discussing more.  After a few more times attempting to discuss his thoughts and feelings, offered to try again tomorrow after improvement of his pain  and for more effective conversation.  He was agreeable.   The difference between aggressive medical intervention and comfort care was considered in light of the patient's goals of care. Hospice and Palliative Care services outpatient were explained and offered.   Discussed the importance of continued  conversation with family and the medical providers regarding overall plan of care and treatment options, ensuring decisions are within the context of the patients values and GOCs.   Questions and concerns were addressed.  Hard Choices booklet left for review. The family was encouraged to call with questions or concerns.  PMT will continue to support holistically.   SUMMARY OF RECOMMENDATIONS   - Continue full code/full scope treatment - Ordered Decadron  4 mg p.o. daily.  Patient was supposed to receive final outpatient dosing of taper from 12/20 to 12/26.  Will temporarily increase again given acute pathological fractures - Ordered morphine  2 mg IV every 3 hours as needed for severe breakthrough pain - Continue efforts to discuss goals of care as patient is able - PMT will continue to follow and support   Prognosis:  Worrisome prognosis with progressive metastatic prostate cancer  Discharge Planning: Skilled Nursing Facility for rehab with Palliative care service follow-up      Primary Diagnoses: Present on Admission:  ETOH abuse  Anemia in neoplastic disease  Prostate cancer metastatic to bone (HCC)  Drug-induced constipation  Vertebral compression fracture (HCC)   Physical Exam Vitals and nursing note reviewed.  Constitutional:      General: He is not in acute distress.    Appearance: He is ill-appearing.     Interventions: Nasal cannula in place.  HENT:     Head: Normocephalic and atraumatic.  Cardiovascular:     Rate and Rhythm: Normal rate.  Pulmonary:     Effort: Pulmonary effort is normal. No respiratory distress.  Skin:    General: Skin is warm and dry.  Neurological:     Mental Status: He is easily aroused.     Comments: Falls asleep throughout attempts at conversation    Vital Signs: BP 125/73 (BP Location: Left Arm)   Pulse 94   Temp 99.3 F (37.4 C)   Resp 18   Ht 5' 10 (1.778 m)   Wt 62.3 kg   SpO2 91%   BMI 19.70 kg/m  Pain Scale: 0-10    Pain Score: 7    SpO2: SpO2: 91 % O2 Device:SpO2: 91 % O2 Flow Rate: .O2 Flow Rate (L/min): 2 L/min   Palliative Assessment/Data:TBD    Landra Howze SHAUNNA Fell, PA-C  Palliative Medicine Team Team phone # [phone]  Thank you for allowing the Palliative Medicine Team to assist in the care of this patient. Please utilize secure chat with additional questions, if there is no response within 30 minutes please call the above phone number.  Palliative Medicine Team providers are available by phone from 7am to 7pm daily and can be reached through the team cell phone.  Should this patient require assistance outside of these hours, please call the patient's attending physician.   Billing based on MDM: High  Problems Addressed: One acute or chronic illness or injury that poses a threat to life or bodily function  Amount and/or Complexity of Data: Category 1:Review of prior external note(s) from each unique source, Review of the result(s) of each unique test, and Assessment requiring an independent historian(s), Category 2:Independent interpretation of a test performed by another physician/other qualified health care professional (not separately reported), and Category 3:Discussion of  management or test interpretation with external physician/other qualified health care professional/appropriate source (not separately reported)  Risks: Parenteral controlled substances   "

## 2024-05-26 NOTE — Plan of Care (Signed)
  Problem: Coping: Goal: Level of anxiety will decrease Outcome: Progressing   Problem: Activity: Goal: Risk for activity intolerance will decrease Outcome: Progressing   Problem: Pain Managment: Goal: General experience of comfort will improve and/or be controlled Outcome: Progressing

## 2024-05-26 NOTE — Progress Notes (Signed)
 Physical Therapy Treatment Patient Details Name: David Townsend MRN: [id number] DOB: 06/11/1960 Today's Date: 05/26/2024   History of Present Illness Pt is a 63 y.o. male presenting to [facility] ED for cancer-related pain and multiple falls. Recently in for same, DC 11/7. PMH: Prostate cancer with spinal metastases, rib fx, anemia of chronic disease, GERD, HTN, HLD, cancer-related pain, tobacco use disorder and alcohol  use disorder. Imaging shows progression of diffuse bone metastasis to skull, ribs, spine, pelvis, humerus, and femurs.    PT Comments  Pt received in supine, awake but drowsy, sig other (?) present in room and encouraging him, pt agreeable to work on transfer to EOB with encouragement. Pt c/o pain/fatigue after transfer to EOB but BP checked and stable. Pt confused, disoriented to location, time and situation and refusing standing transfer training. Worked instead on lateral seated scooting toward HOB and instruction on log roll technique for safety with bed mobility, pt limited receptiveness to instruction and needing modA to maxA to perform functional tasks this date, even with use of hospital bed rails and bed pad assist. Pt positioned in semi-sidelying and PTA encouraging staff to assist him with Q2H rolling for pressure relief/comfort at this time. Pt falling asleep at end of session. Patient will benefit from continued inpatient follow up therapy, <3 hours/day.    If plan is discharge home, recommend the following: Two people to help with walking and/or transfers;Two people to help with bathing/dressing/bathroom;Assistance with cooking/housework;Direct supervision/assist for medications management;Direct supervision/assist for financial management;Assist for transportation;Help with stairs or ramp for entrance;Supervision due to cognitive status   Can travel by private vehicle     No  Equipment Recommendations  None recommended by PT (may need hoyer lift and hospital bed pending  progress)    Recommendations for Other Services       Precautions / Restrictions Precautions Precautions: Back;Fall Precaution Booklet Issued: No Recall of Precautions/Restrictions: Impaired Precaution/Restrictions Comments: nstructed in back precautions and lifting restrictions as related to ADLs and IADLs Required Braces or Orthoses: Spinal Brace Spinal Brace: Thoracolumbosacral orthotic Restrictions Weight Bearing Restrictions Per Provider Order: No Other Position/Activity Restrictions:  (Spinal precautions)     Mobility  Bed Mobility Overal bed mobility: Needs Assistance Bed Mobility: Sidelying to Sit, Sit to Sidelying, Rolling Rolling: Mod assist, Used rails Sidelying to sit: Mod assist, Used rails, HOB elevated     Sit to sidelying: Mod assist, Used rails General bed mobility comments: assist to trunk and LE, poor adherence to spinal precautions/log roll. Increased time/effort to perform and multimodal cues for better technique and use of bed rail.    Transfers Overall transfer level: Needs assistance                Lateral/Scoot Transfers: Mod assist, Max assist General transfer comment: Pt refusing STS despite encouragement, instead worked  on lateral scooting along EOB toward [facility] on his R side. mod to maxA to achieve propulsion while scooting; cues to avoid excessive trunk flexion.    Ambulation/Gait               General Gait Details: Pt refusing to stand; TBD next session   Stairs             Wheelchair Mobility     Tilt Bed    Modified Rankin (Stroke Patients Only)       Balance Overall balance assessment: Needs assistance Sitting-balance support: Feet supported, Bilateral upper extremity supported Sitting balance-Leahy Scale: Poor Sitting balance - Comments: pt appears heavily reliant on  BUE support; needing CGA to minA to prevent excessive trunk flexion       Standing balance comment: pt refusing to attempt                             Communication Communication Communication: No apparent difficulties Factors Affecting Communication: Reduced clarity of speech (at times mumbling due to lethargy)  Cognition Arousal: Lethargic, Alert (slightly more alert once seated, but more lethargic in supine at beginning and end of session.) Behavior During Therapy: Agitated, Lability   PT - Cognitive impairments: Orientation, Awareness, Initiation, Sequencing, Problem solving, Safety/Judgement, Memory, Attention   Orientation impairments: Place, Situation, Time                   PT - Cognition Comments: Pt with delay in all command following, needed reorientation to location/situation and time, sig other or friend present in room and encouraging him, she reports that he has a back precaution handout at home. Pt thinks it is 5am, not 5pm, reoriented. Pt reluctant to follow PTA cues on safer body mechanics for back precs and irritable with encouragement to get up, but agreeable with encouragement from male friend in room and PTA. Pt given specific instructions on plan throughout and often stating what are we doing now? although PTA already just told him the plan/what to do next. Per chart review, pt with history of ETOH which may be impacting cognition as he is only ~4 days from last drink. Following commands: Impaired Following commands impaired: Only follows one step commands consistently, Follows one step commands with increased time, Follows multi-step commands inconsistently    Cueing Cueing Techniques: Verbal cues, Visual cues, Gestural cues, Tactile cues  Exercises      General Comments General comments (skin integrity, edema, etc.): pt demonstrates absent recall for back precuations with the exception of no lifting, pt declines to don TLSO so instead worked on seated balance and lateral scooting prior to return to supine      Pertinent Vitals/Pain Pain Assessment Pain Assessment:  PAINAD Breathing: normal Negative Vocalization: occasional moan/groan, low speech, negative/disapproving quality Facial Expression: facial grimacing Body Language: tense, distressed pacing, fidgeting Consolability: distracted or reassured by voice/touch PAINAD Score: 5 Pain Intervention(s): Limited activity within patient's tolerance, Monitored during session, Repositioned, Patient requesting pain meds-RN notified, Other (comment) (pt defers ice)    Home Living                          Prior Function            PT Goals (current goals can now be found in the care plan section) Acute Rehab PT Goals Patient Stated Goal: to reduce pain PT Goal Formulation: With patient Time For Goal Achievement: 06/07/24 Progress towards PT goals: Not progressing toward goals - comment (pain/lethargy limiting participation)    Frequency    Min 2X/week      PT Plan      Co-evaluation              AM-PAC PT 6 Clicks Mobility   Outcome Measure  Help needed turning from your back to your side while in a flat bed without using bedrails?: A Lot Help needed moving from lying on your back to sitting on the side of a flat bed without using bedrails?: A Lot Help needed moving to and from a bed to a chair (including a wheelchair)?: A Lot  Help needed standing up from a chair using your arms (e.g., wheelchair or bedside chair)?: Total (anticipate +2 assist needed) Help needed to walk in hospital room?: Total Help needed climbing 3-5 steps with a railing? : Total 6 Click Score: 9    End of Session Equipment Utilized During Treatment: Gait belt (pt refused back brace/standing trial this date due to pain/fatigue) Activity Tolerance: Patient limited by pain;Patient limited by lethargy Patient left: in bed;with call bell/phone within reach;with bed alarm set;with family/visitor present;Other (comment) (young male visitor in room; pt semi-sidelying in bed toward his L) Nurse  Communication: Mobility status;Patient requests pain meds;Precautions (assist him to roll Q2H; pt lethargic but also c/o pain) PT Visit Diagnosis: Unsteadiness on feet (R26.81);Pain;Muscle weakness (generalized) (M62.81) Pain - part of body:  (back)     Time: 1755-1816 PT Time Calculation (min) (ACUTE ONLY): 21 min  Charges:    $Therapeutic Activity: 8-22 mins PT General Charges $$ ACUTE PT VISIT: 1 Visit                     Tanique Matney P., PTA Acute Rehabilitation Services Secure Chat Preferred 9a-5:30pm Office: [phone]    Connell HERO [facility] 05/26/2024, 6:59 PM

## 2024-05-27 DIAGNOSIS — S22000A Wedge compression fracture of unspecified thoracic vertebra, initial encounter for closed fracture: Secondary | ICD-10-CM | POA: Diagnosis not present

## 2024-05-27 LAB — BASIC METABOLIC PANEL WITH GFR
Anion gap: 15 (ref 5–15)
BUN: 8 mg/dL (ref 8–23)
CO2: 22 mmol/L (ref 22–32)
Calcium: 8.6 mg/dL — ABNORMAL LOW (ref 8.9–10.3)
Chloride: 94 mmol/L — ABNORMAL LOW (ref 98–111)
Creatinine, Ser: 0.47 mg/dL — ABNORMAL LOW (ref 0.61–1.24)
GFR, Estimated: >60 mL/min
Glucose, Bld: 110 mg/dL — ABNORMAL HIGH (ref 70–99)
Potassium: 4.3 mmol/L (ref 3.5–5.1)
Sodium: 131 mmol/L — ABNORMAL LOW (ref 135–145)

## 2024-05-27 LAB — MAGNESIUM: Magnesium: 1.9 mg/dL (ref 1.7–2.4)

## 2024-05-27 MED ORDER — MORPHINE SULFATE ER 60 MG PO TBCR: 60.0000 mg | EXTENDED_RELEASE_TABLET | Freq: Two times a day (BID) | ORAL | 0 refills | Status: AC

## 2024-05-27 MED ORDER — SODIUM CHLORIDE 1 G PO TABS: 1.0000 g | ORAL_TABLET | Freq: Three times a day (TID) | ORAL | 0 refills | Status: AC

## 2024-05-27 MED ORDER — POLYETHYLENE GLYCOL 3350 17 G PO PACK
17.0000 g | PACK | Freq: Two times a day (BID) | ORAL | Status: DC
  Administered 2024-05-27: 17 g via ORAL
  Filled 2024-05-27: qty 1

## 2024-05-27 MED ORDER — BISACODYL 10 MG RE SUPP: 10.0000 mg | Freq: Every day | RECTAL | 0 refills | Status: AC | PRN

## 2024-05-27 MED ORDER — MORPHINE SULFATE 15 MG PO TABS: 15.0000 mg | ORAL_TABLET | Freq: Four times a day (QID) | ORAL | 0 refills | Status: AC | PRN

## 2024-05-27 MED ORDER — DEXAMETHASONE 4 MG PO TABS: 4.0000 mg | ORAL_TABLET | Freq: Every day | ORAL | 0 refills | Status: AC

## 2024-05-27 NOTE — TOC Progression Note (Signed)
 Transition of Care [facility]) - Progression Note    Patient Details  Name: David Townsend MRN: [id number] Date of Birth: 02/23/1961  Transition of Care [facility]) CM/SW Contact  Bridget Cordella Simmonds, LCSW Phone Number: 05/27/2024, 9:44 AM  Clinical Narrative:   Message from [facility]: [name] approved, she can receive pt today, will need DC summary by 1pm.  MD informed.     Expected Discharge Plan: Skilled Nursing Facility Barriers to Discharge: English As A Second Language Teacher, Continued Medical Work up               Expected Discharge Plan and Services In-house Referral: Clinical Social Work   Post Acute Care Choice: Skilled Nursing Facility Living arrangements for the past 2 months: Single Family Home                                       Social Drivers of Health (SDOH) Interventions SDOH Screenings   Food Insecurity: No Food Insecurity (05/23/2024)  Housing: Low Risk (05/23/2024)  Transportation Needs: No Transportation Needs (05/23/2024)  Utilities: Not At Risk (05/23/2024)  Alcohol  Screen: Medium Risk (12/04/2023)  Depression (PHQ2-9): Low Risk (12/04/2023)  Tobacco Use: High Risk (05/22/2024)    Readmission Risk Interventions    04/16/2024    1:08 PM  Readmission Risk Prevention Plan  Transportation Screening Complete  Medication Review (RN Care Manager) Referral to Pharmacy  PCP or Specialist appointment within 3-5 days of discharge Complete  HRI or Home Care Consult Complete  SW Recovery Care/Counseling Consult Complete  Palliative Care Screening Not Applicable  Skilled Nursing Facility Complete

## 2024-05-27 NOTE — Progress Notes (Signed)
 Report called and given to [facility], LPN at [facility]. All of nurse's questions answered to her satisfaction. Pt to be transported by [facility]. Awaiting pick-up.

## 2024-05-27 NOTE — Plan of Care (Signed)
" °  Problem: Health Behavior/Discharge Planning: Goal: Ability to manage health-related needs will improve Outcome: Progressing   Problem: Clinical Measurements: Goal: Will remain free from infection Outcome: Progressing Goal: Diagnostic test results will improve Outcome: Progressing Goal: Respiratory complications will improve Outcome: Progressing Goal: Cardiovascular complication will be avoided Outcome: Progressing   Problem: Nutrition: Goal: Adequate nutrition will be maintained Outcome: Progressing   Problem: Coping: Goal: Level of anxiety will decrease Outcome: Progressing   Problem: Elimination: Goal: Will not experience complications related to bowel motility Outcome: Progressing   Problem: Pain Managment: Goal: General experience of comfort will improve and/or be controlled Outcome: Progressing   Problem: Safety: Goal: Ability to remain free from injury will improve Outcome: Progressing   Problem: Skin Integrity: Goal: Risk for impaired skin integrity will decrease Outcome: Progressing   "

## 2024-05-27 NOTE — Discharge Summary (Signed)
 Physician Discharge Summary  David Townsend [name] DOB: 05-21-1961 DOA: 05/22/2024  PCP: Jaycee Greig PARAS, NP  Admit date: 05/22/2024 Discharge date: 05/27/2024  Admitted From: Home Disposition: SNF  Recommendations for Outpatient Follow-up:  Follow up with SNF provider at earliest convenience Outpatient follow-up with palliative care/oncology/urology  follow up in ED if symptoms worsen or new appear   Home Health: No Equipment/Devices: None  Discharge Condition: Stable CODE STATUS: Full Diet recommendation: Heart healthy  Brief/Interim Summary: 63 year old male past medical history significant for metastatic prostate cancer (mets to the bone), history of penile cancer, anemia, hypertension and hyperlipidemia was admitted with fall, with resultant multiple vertebral compression fracture and pain. CT lumbar spine revealed widespread lucent and sclerotic metastatic disease throughout the thoracic and lumbar spine. Interval development of compression fractures of T10 and T12, dorsal epidural lipomatosis with mass effect on the thecal sac/cord which are displaced anteriorly, most notable from T2-T3 through T8-T9 level. As per neurosurgery team, patient is not a surgical candidate.  UA was suggestive of UTI: He was started on IV antibiotics: He has completed 5 days of IV antibiotics.  PT recommended SNF placement.  Palliative care consulted for goals of care discussion as well: Currently remains full code.  He is currently hemodynamically stable for discharge to SNF.  He will be discharged to SNF once bed is available.  Discharge Diagnoses:   Multiple vertebral compression fractures status post fall, patient with prostate cancer with known bony mets - Imaging as above. As per neurosurgery team, patient is not a surgical candidate.  Neurosurgery recommended brace -Continue current pain management.  Continue bowel management -PT recommended SNF placement.  TOC following. - Palliative  care evaluation appreciated: Patient has been started on oral Decadron  as per palliative care which will be continued for now at 4 mg daily and the dose can be tapered as an outpatient by palliative care and/or oncology. - He is currently hemodynamically stable for discharge to SNF.  He will be discharged to SNF once bed is available.   Probable UTI: Present on admission - Completed 5 days treatment with Rocephin    Constipation - Continue bowel regimen   AKI - Resolved   Hyponatremia - Sodium improving to 131 today.  Continue salt tabs for now.  Monitor as an outpatient   Hypokalemia - Resolved   Hypomagnesemia - Resolved   Anemia of chronic disease Macrocytosis - From chronic illnesses.  Hemoglobin stable.  Monitor intermittently as an outpatient.  No signs of bleeding   Thrombocytopenia -Question cause.  Platelets trending downwards.  Monitor as an outpatient.  No signs of bleeding  Discharge Instructions   Allergies as of 05/27/2024   No Known Allergies      Medication List     STOP taking these medications    amLODipine  10 MG tablet Commonly known as: NORVASC    furosemide  20 MG tablet Commonly known as: LASIX    valsartan  40 MG tablet Commonly known as: DIOVAN        TAKE these medications    bisacodyl  10 MG suppository Commonly known as: DULCOLAX Place 1 suppository (10 mg total) rectally daily as needed for severe constipation.   dexamethasone  4 MG tablet Commonly known as: DECADRON  Take 1 tablet (4 mg total) by mouth daily. Start taking on: May 28, 2024 What changed:  medication strength See the new instructions.   Erleada  60 MG tablet Generic drug: apalutamide  Take 120-180 mg by mouth daily.   folic acid  1 MG tablet Commonly known as: FOLVITE   Take 1 tablet (1 mg total) by mouth daily.   ibuprofen  800 MG tablet Commonly known as: ADVIL  Take 1 tablet (800 mg total) by mouth every 8 (eight) hours as needed for fever, mild pain (pain  score 1-3) or headache (alternate with tylenol  for fever).   Iron  (Ferrous Sulfate ) 325 (65 Fe) MG Tabs Take 325 mg by mouth daily.   lidocaine  5 % Commonly known as: Lidoderm  Place 1 patch onto the skin daily. Remove & Discard patch within 12 hours or as directed by MD   methocarbamol  500 MG tablet Commonly known as: ROBAXIN  Take 1 tablet (500 mg total) by mouth every 8 (eight) hours as needed for muscle spasms.   morphine  15 MG tablet Commonly known as: MSIR Take 1 tablet (15 mg total) by mouth every 6 (six) hours as needed for severe pain (pain score 7-10) or moderate pain (pain score 4-6). What changed: when to take this   morphine  60 MG 12 hr tablet Commonly known as: MS CONTIN  Take 1 tablet (60 mg total) by mouth every 12 (twelve) hours. What changed: Another medication with the same name was changed. Make sure you understand how and when to take each.   multivitamin with minerals tablet Take 1 tablet by mouth daily.   polyethylene glycol 17 g packet Commonly known as: MIRALAX  / GLYCOLAX  Take 17 g by mouth 2 (two) times daily. What changed:  when to take this reasons to take this   senna-docusate 8.6-50 MG tablet Commonly known as: Senokot-S Take 2 tablets by mouth 2 (two) times daily.   sodium chloride  1 g tablet Take 1 tablet (1 g total) by mouth 3 (three) times daily with meals.   thiamine  100 MG tablet Commonly known as: VITAMIN B1 Take 1 tablet (100 mg total) by mouth daily.   Tylenol  8 Hour Arthritis Pain 650 MG CR tablet Generic drug: acetaminophen  Take 2 tablets (1,300 mg total) by mouth 2 (two) times daily as needed for pain.        Contact information for after-discharge care     Destination     [facility], [place] .   Service: Skilled Nursing Contact information: [address] [place]  [address] [phone]                    Allergies[1]  Consultations: Neurosurgery consulted by ED  provider. palliative care    Procedures/Studies: CT T-SPINE NO CHARGE Result Date: 05/22/2024 CLINICAL DATA:  Trauma history of metastatic disease EXAM: CT Thoracic and Lumbar spine without contrast TECHNIQUE: Multiplanar CT images of the thoracic and lumbar spine were reconstructed from contemporary CT of the Chest, Abdomen, and Pelvis. RADIATION DOSE REDUCTION: This exam was performed according to the departmental dose-optimization program which includes automated exposure control, adjustment of the mA and/or kV according to patient size and/or use of iterative reconstruction technique. CONTRAST:  None or No additional COMPARISON:  MRI 04/14/2024, 04/08/2024, 03/27/2024, 09/19/2022 FINDINGS: CT THORACIC SPINE FINDINGS Alignment: Stable thoracic alignment Vertebrae: Wide spread lytic and sclerotic skeletal metastatic disease throughout the thoracic spine. Pathologic compression fractures at T3, T4, T5, T6 and T8, grossly similar as compared to the MRI from November. T8 compression fracture with estimated 30% loss of vertebral body height but not progressive. Interim development of superior endplate fracture at T10 with estimated 25% loss of vertebral body height centrally. New moderate compression fracture of T12, approaching 50% loss of vertebral body height. Mild retropulsion of T12 vertebral body measuring  about 3 mm. Paraspinal and other soft tissues: Paravertebral edema and soft tissue swelling at multiple levels. Disc levels: Dorsal epidural lipomatosis with mass effect on the thecal sac/cord which are displaced anteriorly, this is most notable from T2-T3 through T8-T9 level. CT LUMBAR SPINE FINDINGS Segmentation: 5 lumbar type vertebrae. Alignment: Normal. Vertebrae: Widespread lucent and sclerotic metastatic disease. Compared with the MRI from October, interval mild to moderate superior endplate compression fracture at L1 with about 25% loss of vertebral body height centrally. Mild 2 mm retropulsion  of the upper vertebral body without significant canal stenosis. Mild superior endplate fracture deformity at L4 with about 20% loss of vertebral body height centrally. Mild inferior endplate deformity at L3. Paraspinal and other soft tissues: Mild paravertebral edema at L1. Disc levels: At L1-L2, patent disc space. No canal stenosis or foraminal narrowing. At L2-L3, no significant disc bulge. Facet degenerative changes. Dorsal epidural lipomatosis with displacement of the thecal sac anteriorly. No foraminal narrowing. At L3-L4, diffuse disc bulge. Facet degenerative changes. There is bilateral foraminal narrowing. At L4-L5, diffuse disc bulge. Hypertrophic facet degenerative changes and ligamentum flavum thickening. At L5-S1, no high-grade canal stenosis. Bilateral facet degenerative changes and right greater than left foraminal narrowing. IMPRESSION: 1. Widespread lucent and sclerotic metastatic disease throughout the thoracic and lumbar spine. 2. Interim development of compression fractures at T10 and T12. Mild retropulsion of T12 vertebral body measuring about 3 mm. 3. Interim compression fractures at L1, inferior endplate of L3, and superior endplate of L4. Mild 2 mm retropulsion of the upper L1 vertebral body without significant canal stenosis. 4. Dorsal epidural lipomatosis with mass effect on the thecal sac/cord which are displaced anteriorly, this is most notable from T2-T3 through T8-T9 level. Electronically Signed   By: Luke Bun M.D.   On: 05/22/2024 20:58   CT L-SPINE NO CHARGE Result Date: 05/22/2024 CLINICAL DATA:  Trauma history of metastatic disease EXAM: CT Thoracic and Lumbar spine without contrast TECHNIQUE: Multiplanar CT images of the thoracic and lumbar spine were reconstructed from contemporary CT of the Chest, Abdomen, and Pelvis. RADIATION DOSE REDUCTION: This exam was performed according to the departmental dose-optimization program which includes automated exposure control,  adjustment of the mA and/or kV according to patient size and/or use of iterative reconstruction technique. CONTRAST:  None or No additional COMPARISON:  MRI 04/14/2024, 04/08/2024, 03/27/2024, 09/19/2022 FINDINGS: CT THORACIC SPINE FINDINGS Alignment: Stable thoracic alignment Vertebrae: Wide spread lytic and sclerotic skeletal metastatic disease throughout the thoracic spine. Pathologic compression fractures at T3, T4, T5, T6 and T8, grossly similar as compared to the MRI from November. T8 compression fracture with estimated 30% loss of vertebral body height but not progressive. Interim development of superior endplate fracture at T10 with estimated 25% loss of vertebral body height centrally. New moderate compression fracture of T12, approaching 50% loss of vertebral body height. Mild retropulsion of T12 vertebral body measuring about 3 mm. Paraspinal and other soft tissues: Paravertebral edema and soft tissue swelling at multiple levels. Disc levels: Dorsal epidural lipomatosis with mass effect on the thecal sac/cord which are displaced anteriorly, this is most notable from T2-T3 through T8-T9 level. CT LUMBAR SPINE FINDINGS Segmentation: 5 lumbar type vertebrae. Alignment: Normal. Vertebrae: Widespread lucent and sclerotic metastatic disease. Compared with the MRI from October, interval mild to moderate superior endplate compression fracture at L1 with about 25% loss of vertebral body height centrally. Mild 2 mm retropulsion of the upper vertebral body without significant canal stenosis. Mild superior endplate fracture deformity at  L4 with about 20% loss of vertebral body height centrally. Mild inferior endplate deformity at L3. Paraspinal and other soft tissues: Mild paravertebral edema at L1. Disc levels: At L1-L2, patent disc space. No canal stenosis or foraminal narrowing. At L2-L3, no significant disc bulge. Facet degenerative changes. Dorsal epidural lipomatosis with displacement of the thecal sac  anteriorly. No foraminal narrowing. At L3-L4, diffuse disc bulge. Facet degenerative changes. There is bilateral foraminal narrowing. At L4-L5, diffuse disc bulge. Hypertrophic facet degenerative changes and ligamentum flavum thickening. At L5-S1, no high-grade canal stenosis. Bilateral facet degenerative changes and right greater than left foraminal narrowing. IMPRESSION: 1. Widespread lucent and sclerotic metastatic disease throughout the thoracic and lumbar spine. 2. Interim development of compression fractures at T10 and T12. Mild retropulsion of T12 vertebral body measuring about 3 mm. 3. Interim compression fractures at L1, inferior endplate of L3, and superior endplate of L4. Mild 2 mm retropulsion of the upper L1 vertebral body without significant canal stenosis. 4. Dorsal epidural lipomatosis with mass effect on the thecal sac/cord which are displaced anteriorly, this is most notable from T2-T3 through T8-T9 level. Electronically Signed   By: Luke Bun M.D.   On: 05/22/2024 20:58   CT CHEST ABDOMEN PELVIS W CONTRAST Result Date: 05/22/2024 CLINICAL DATA:  Blunt trauma EXAM: CT CHEST, ABDOMEN, AND PELVIS WITH CONTRAST TECHNIQUE: Multidetector CT imaging of the chest, abdomen and pelvis was performed following the standard protocol during bolus administration of intravenous contrast. RADIATION DOSE REDUCTION: This exam was performed according to the departmental dose-optimization program which includes automated exposure control, adjustment of the mA and/or kV according to patient size and/or use of iterative reconstruction technique. CONTRAST:  OMNIPAQUE  IOHEXOL  300 MG/ML  SOLN COMPARISON:  Radiographs 05/22/2024, MRI 04/14/2024, CT 11/27/2023, chest x-ray 04/10/2024 FINDINGS: CT CHEST FINDINGS Cardiovascular: Nonaneurysmal aorta. Atherosclerosis. Multi vessel coronary vascular calcification. Normal cardiac size. No pericardial effusion. Mediastinum/Nodes: Patent trachea. No thyroid  mass. No  suspicious lymph nodes. Esophagus is grossly unremarkable. Lungs/Pleura: Consolidative airspace disease in the left lower lobe with mild subpleural consolidation in the right lower lobe. Mild bronchiectasis in the lower lobes. Mild bronchiectasis in the left upper lobe. Heterogeneous ground-glass disease in the left greater than right upper lobes. No pleural effusion or pneumothorax. Musculoskeletal: Lytic and sclerotic lesions within the sternum ribs spine and pelvis corresponding to history of skeletal metastatic disease. Compared with the CT from June 2025, there is progressive metastatic disease in the spine with multiple pathologic compression fractures. CT ABDOMEN PELVIS FINDINGS Hepatobiliary: No focal liver abnormality is seen. No gallstones, gallbladder wall thickening, or biliary dilatation. Pancreas: Unremarkable. No pancreatic ductal dilatation or surrounding inflammatory changes. Spleen: Normal in size without focal abnormality. Adrenals/Urinary Tract: Adrenal glands are normal. Kidneys show no hydronephrosis. Prominence of the ureters without obstructing stone. Catheter in the bladder. Diffuse thick-walled appearance of the bladder with mucosal enhancement. Stomach/Bowel: Stomach is decompressed. There is no dilated small bowel. No acute bowel wall thickening. Moderate large colonic stool burden. Vascular/Lymphatic: Aortic atherosclerosis. No enlarged abdominal or pelvic lymph nodes. Reproductive: Heterogenous enhancing tissue at the prostate bed, series 3, image 1009 and sagittal series 9, image 91. Other: Negative for ascites or free air Musculoskeletal: See separately dictated spine CT. Widespread lytic and sclerotic metastatic disease. IMPRESSION: 1. No CT evidence for acute traumatic abnormality involving the chest abdomen or pelvis. 2. Consolidative airspace disease in the left lower lobe with mild subpleural consolidation in the right lower lobe, findings could be due to pneumonia or aspiration.  Heterogeneous ground-glass disease in the left greater than right upper lobes, with some bronchiectasis noted, findings could be due to residual pneumonia or lung inflammatory process with developing post inflammatory scarring. 3. Widespread lytic and sclerotic metastatic disease. 4. Diffuse thick-walled appearance of the bladder with mucosal enhancement, question cystitis. 5. Irregular enhancing tissue at the prostate bed. 6. Aortic atherosclerosis. Aortic Atherosclerosis (ICD10-I70.0). Electronically Signed   By: Luke Bun M.D.   On: 05/22/2024 20:24   CT Head Wo Contrast Result Date: 05/22/2024 CLINICAL DATA:  Recent fall with headaches and neck pain, initial encounter EXAM: CT HEAD WITHOUT CONTRAST CT CERVICAL SPINE WITHOUT CONTRAST TECHNIQUE: Multidetector CT imaging of the head and cervical spine was performed following the standard protocol without intravenous contrast. Multiplanar CT image reconstructions of the cervical spine were also generated. RADIATION DOSE REDUCTION: This exam was performed according to the departmental dose-optimization program which includes automated exposure control, adjustment of the mA and/or kV according to patient size and/or use of iterative reconstruction technique. COMPARISON:  09/19/2022 FINDINGS: CT HEAD FINDINGS Brain: No evidence of acute infarction, hemorrhage, hydrocephalus, extra-axial collection or mass lesion/mass effect. Mild atrophic changes and chronic white matter ischemic changes are seen. Lacunar infarct in the right thalamus is noted. Vascular: No hyperdense vessel or unexpected calcification. Skull: Normal. Negative for fracture or focal lesion. Sinuses/Orbits: No acute finding. Other: None. CT CERVICAL SPINE FINDINGS Alignment: Loss of the normal cervical lordosis is noted. Skull base and vertebrae: 7 cervical segments are well visualized. Vertebral body height is well maintained. Increased sclerosis is noted in the anterior aspect of the C4  vertebral body likely related to prior prostate metastatic disease given the clinical history. No acute fracture or acute facet abnormality is noted. Multilevel osteophytic change and facet hypertrophic changes are noted. Soft tissues and spinal canal: Surrounding soft tissue structures are within normal limits. Upper chest: Visualized lung apices are within normal limits. Other: None IMPRESSION: CT of the head: No acute intracranial abnormality noted. Chronic atrophic and ischemic changes. CT of the cervical spine: Multilevel degenerative change without acute abnormality. Electronically Signed   By: Oneil Devonshire M.D.   On: 05/22/2024 20:04   CT Cervical Spine Wo Contrast Result Date: 05/22/2024 CLINICAL DATA:  Recent fall with headaches and neck pain, initial encounter EXAM: CT HEAD WITHOUT CONTRAST CT CERVICAL SPINE WITHOUT CONTRAST TECHNIQUE: Multidetector CT imaging of the head and cervical spine was performed following the standard protocol without intravenous contrast. Multiplanar CT image reconstructions of the cervical spine were also generated. RADIATION DOSE REDUCTION: This exam was performed according to the departmental dose-optimization program which includes automated exposure control, adjustment of the mA and/or kV according to patient size and/or use of iterative reconstruction technique. COMPARISON:  09/19/2022 FINDINGS: CT HEAD FINDINGS Brain: No evidence of acute infarction, hemorrhage, hydrocephalus, extra-axial collection or mass lesion/mass effect. Mild atrophic changes and chronic white matter ischemic changes are seen. Lacunar infarct in the right thalamus is noted. Vascular: No hyperdense vessel or unexpected calcification. Skull: Normal. Negative for fracture or focal lesion. Sinuses/Orbits: No acute finding. Other: None. CT CERVICAL SPINE FINDINGS Alignment: Loss of the normal cervical lordosis is noted. Skull base and vertebrae: 7 cervical segments are well visualized. Vertebral body  height is well maintained. Increased sclerosis is noted in the anterior aspect of the C4 vertebral body likely related to prior prostate metastatic disease given the clinical history. No acute fracture or acute facet abnormality is noted. Multilevel osteophytic change and facet hypertrophic changes are noted. Soft  tissues and spinal canal: Surrounding soft tissue structures are within normal limits. Upper chest: Visualized lung apices are within normal limits. Other: None IMPRESSION: CT of the head: No acute intracranial abnormality noted. Chronic atrophic and ischemic changes. CT of the cervical spine: Multilevel degenerative change without acute abnormality. Electronically Signed   By: Oneil Devonshire M.D.   On: 05/22/2024 20:04   DG Pelvis Portable Result Date: 05/22/2024 EXAM: 1 or 2 VIEW(S) XRAY OF THE PELVIS 05/22/2024 05:55:00 PM COMPARISON: Comparison 11/27/2023. CLINICAL HISTORY: fall pain FINDINGS: BONES AND JOINTS: Bony sclerotic metastases are present in the pelvis, though not as well visualized on today's radiograph compared to the comparison CT. There is a new 8 mm sclerotic lesion in the left intertrochanteric region. No pelvic bone diastasis. Mild multilevel degenerative disc disease of the lumbar spine. SOFT TISSUES: The soft tissues are unremarkable. IMPRESSION: 1. No acute fracture, pelvic bone diastasis, or dislocation. 2. New 8 mm sclerotic density within the left femoral intertrochanteric region is worrisome for progressive metastatic disease. The majority of the sclerotic lesions on the prior CT are not as well visualized on today's radiograph. Electronically signed by: Rogelia Myers MD 05/22/2024 06:35 PM EST RP Workstation: [id number]   DG Chest Port 1 View Result Date: 05/22/2024 EXAM: 1 VIEW(S) XRAY OF THE CHEST 05/22/2024 05:54:00 PM COMPARISON: 04/14/2024, 11/27/2023 CLINICAL HISTORY: fall pain FINDINGS: LUNGS AND PLEURA: Mild persistent reticulation in the left upper lung zone  otherwise, significant improvement of the lungs compared to the prior study. No pleural effusion. No pneumothorax. HEART AND MEDIASTINUM: No acute abnormality of the cardiac and mediastinal silhouettes. BONES AND SOFT TISSUES: Multilevel thoracic osteophytosis. IMPRESSION: 1. Significant improvement of the lungs compared to the prior study, with mild persistent reticulation in the left upper lung zone, possibly scarring. Otherwise, no acute cardiopulmonary abnormality. Mhm Electronically signed by: Rogelia Myers MD 05/22/2024 06:30 PM EST RP Workstation: [id number]      Subjective: Patient seen and examined at bedside.  Still slow to respond and poor historian.  No agitation, seizures or vomiting reported.  Complains of constipation.  Discharge Exam: Vitals:   05/27/24 0405 05/27/24 0722  BP: 122/73 114/74  Pulse: 79 75  Resp: 17 18  Temp: 98.2 F (36.8 C) 98.1 F (36.7 C)  SpO2: 95% 97%    General: On room air.  No distress.  Looks chronically ill and deconditioned. ENT/neck: No thyromegaly.  JVD is not elevated  respiratory: Decreased breath sounds at bases bilaterally with some crackles; no wheezing  CVS: S1-S2 heard, rate controlled currently Abdominal: Soft, nontender, slightly distended; no organomegaly, bowel sounds are heard Extremities: Trace lower extremity edema; no cyanosis  CNS: Awake and alert.  Still slow to respond and a poor historian.  No focal neurologic deficit.  Moves extremities Lymph: No obvious lymphadenopathy Skin: No obvious ecchymosis/lesions  psych: Mostly flat affect with no signs of agitation.   Musculoskeletal: No obvious joint swelling/deformity    The results of significant diagnostics from this hospitalization (including imaging, microbiology, ancillary and laboratory) are listed below for reference.     Microbiology: Recent Results (from the past 240 hours)  Culture, blood (routine x 2)     Status: None (Preliminary result)   Collection  Time: 05/22/24 10:30 PM   Specimen: BLOOD  Result Value Ref Range Status   Specimen Description   Final    BLOOD RIGHT ANTECUBITAL Performed at [facility], [address] [place], [place] [address]    Special Requests  Final    BOTTLES DRAWN AEROBIC AND ANAEROBIC Blood Culture adequate volume Performed at [facility], [address], [place], [place] [address]    Culture   Final    NO GROWTH 3 DAYS Performed at [facility], [address], [place], [place] [address]    Report Status PENDING  Incomplete  Culture, blood (routine x 2)     Status: None (Preliminary result)   Collection Time: 05/22/24 10:39 PM   Specimen: BLOOD LEFT FOREARM  Result Value Ref Range Status   Specimen Description   Final    BLOOD LEFT FOREARM Performed at [facility], [address] [place], [place] [address]    Special Requests   Final    BOTTLES DRAWN AEROBIC AND ANAEROBIC Blood Culture results may not be optimal due to an inadequate volume of blood received in culture bottles Performed at [facility], [address], [place], [place] [address]    Culture   Final    NO GROWTH 3 DAYS Performed at [facility], [address], [place], [place] [address]    Report Status PENDING  Incomplete     Labs: BNP (last 3 results) No results for input(s): BNP in the last 8760 hours. Basic Metabolic Panel: Recent Labs  Lab 05/22/24 1825 05/23/24 0530 05/25/24 0340 05/26/24 0439 05/27/24 0502  NA 132* 134* 130* 127* 131*  K 3.1* 3.7 3.0* 3.1* 4.3  CL 97* 99 93* 91* 94*  CO2 23 23 24 24 22   GLUCOSE 86 93 79 90 110*  BUN 20 15 7* 5* 8  CREATININE 1.31* 0.86 0.55* 0.48* 0.47*  CALCIUM  8.3* 8.6* 8.0* 8.3* 8.6*  MG  --  2.1 1.3* 2.2 1.9  PHOS  --   --  2.9 2.6  --    Liver Function Tests: Recent Labs  Lab 05/22/24 1825 05/23/24 0530 05/25/24 0340 05/26/24 0439  AST 91* 124*  --   --   ALT 24 33  --   --    ALKPHOS 172* 174*  --   --   BILITOT 0.2 <0.2  --   --   PROT 5.4* 5.4*  --   --   ALBUMIN 3.3* 3.2* 2.9* 3.0*   No results for input(s): LIPASE, AMYLASE in the last 168 hours. No results for input(s): AMMONIA in the last 168 hours. CBC: Recent Labs  Lab 05/22/24 1825 05/23/24 0530 05/25/24 0340 05/26/24 0439  WBC 7.5 9.2 7.0 5.1  NEUTROABS 6.8  --  6.4 4.3  HGB 8.9* 9.3* 9.5* 9.3*  HCT 26.0* 27.2* 27.1* 26.0*  MCV 111.6* 111.5* 108.4* 105.7*  PLT 115* 106* 81* 70*   Cardiac Enzymes: No results for input(s): CKTOTAL, CKMB, CKMBINDEX, TROPONINI in the last 168 hours. BNP: Invalid input(s): POCBNP CBG: Recent Labs  Lab 05/23/24 0644  GLUCAP 106*   D-Dimer No results for input(s): DDIMER in the last 72 hours. Hgb A1c No results for input(s): HGBA1C in the last 72 hours. Lipid Profile No results for input(s): CHOL, HDL, LDLCALC, TRIG, CHOLHDL, LDLDIRECT in the last 72 hours. Thyroid  function studies No results for input(s): TSH, T4TOTAL, T3FREE, THYROIDAB in the last 72 hours.  Invalid input(s): FREET3 Anemia work up No results for input(s): VITAMINB12, FOLATE, FERRITIN, TIBC, IRON , RETICCTPCT in the last 72 hours. Urinalysis    Component Value Date/Time   COLORURINE YELLOW 05/25/2024 0136   APPEARANCEUR HAZY (A) 05/25/2024 0136   LABSPEC 1.010 05/25/2024  0136   PHURINE 6.0 05/25/2024 0136   GLUCOSEU NEGATIVE 05/25/2024 0136   HGBUR SMALL (A) 05/25/2024 0136   BILIRUBINUR NEGATIVE 05/25/2024 0136   BILIRUBINUR negative 08/12/2019 1130   KETONESUR 15 (A) 05/25/2024 0136   PROTEINUR NEGATIVE 05/25/2024 0136   UROBILINOGEN 0.2 08/12/2019 1130   NITRITE POSITIVE (A) 05/25/2024 0136   LEUKOCYTESUR SMALL (A) 05/25/2024 0136   Sepsis Labs Recent Labs  Lab 05/22/24 1825 05/23/24 0530 05/25/24 0340 05/26/24 0439  WBC 7.5 9.2 7.0 5.1   Microbiology Recent Results (from the past 240 hours)  Culture, blood  (routine x 2)     Status: None (Preliminary result)   Collection Time: 05/22/24 10:30 PM   Specimen: BLOOD  Result Value Ref Range Status   Specimen Description   Final    BLOOD RIGHT ANTECUBITAL Performed at [facility], [address] [place], [place] [address]    Special Requests   Final    BOTTLES DRAWN AEROBIC AND ANAEROBIC Blood Culture adequate volume Performed at [facility], [address] [place], [place] [address]    Culture   Final    NO GROWTH 3 DAYS Performed at [facility], [address], [place], [place] [address]    Report Status PENDING  Incomplete  Culture, blood (routine x 2)     Status: None (Preliminary result)   Collection Time: 05/22/24 10:39 PM   Specimen: BLOOD LEFT FOREARM  Result Value Ref Range Status   Specimen Description   Final    BLOOD LEFT FOREARM Performed at [facility], [address] [place], [place] [address]    Special Requests   Final    BOTTLES DRAWN AEROBIC AND ANAEROBIC Blood Culture results may not be optimal due to an inadequate volume of blood received in culture bottles Performed at [facility], [address] [place], [place] [address]    Culture   Final    NO GROWTH 3 DAYS Performed at [facility], [address] [place], [place] [address]    Report Status PENDING  Incomplete     Time coordinating discharge: 35 minutes  SIGNED:   Sophie Mao, MD  [facility] 05/27/2024, 10:34 AM      [1] No Known Allergies

## 2024-05-27 NOTE — TOC Transition Note (Signed)
 Transition of Care [facility]) - Discharge Note   Patient Details  Name: David Townsend MRN: [id number] Date of Birth: 04/22/1961  [facility] CM/SW Contact:  Bridget Cordella Simmonds, LCSW Phone Number: 05/27/2024, 11:29 AM   Clinical Narrative:   Pt discharging to [facility], room [address].  RN call report to [phone].  PTAR called [phone].     Final next level of care: Skilled Nursing Facility Barriers to Discharge: Barriers Resolved   Patient Goals and CMS Choice Patient states their goals for this hospitalization and ongoing recovery are:: get back to where I was with walking          Discharge Placement              Patient chooses bed at: [facility] Patient to be transferred to facility by: ptar Name of family member notified: [name]/friend Patient and family notified of of transfer: 05/27/24  Discharge Plan and Services Additional resources added to the After Visit Summary for   In-house Referral: Clinical Social Work   Post Acute Care Choice: Skilled Nursing Facility                               Social Drivers of Health (SDOH) Interventions SDOH Screenings   Food Insecurity: No Food Insecurity (05/23/2024)  Housing: Low Risk (05/23/2024)  Transportation Needs: No Transportation Needs (05/23/2024)  Utilities: Not At Risk (05/23/2024)  Alcohol  Screen: Medium Risk (12/04/2023)  Depression (PHQ2-9): Low Risk (12/04/2023)  Tobacco Use: High Risk (05/22/2024)     Readmission Risk Interventions    04/16/2024    1:08 PM  Readmission Risk Prevention Plan  Transportation Screening Complete  Medication Review (RN Care Manager) Referral to Pharmacy  PCP or Specialist appointment within 3-5 days of discharge Complete  HRI or Home Care Consult Complete  SW Recovery Care/Counseling Consult Complete  Palliative Care Screening Not Applicable  Skilled Nursing Facility Complete

## 2024-05-27 NOTE — Progress Notes (Signed)
 Patient discharged to SNF. Left unit on stretcher pushed by [facility] staff. Left in stable condition.

## 2024-05-28 LAB — CULTURE, BLOOD (ROUTINE X 2)
Culture: NO GROWTH
Culture: NO GROWTH
Special Requests: ADEQUATE

## 2024-06-03 ENCOUNTER — Telehealth: Payer: Self-pay | Admitting: *Deleted

## 2024-06-03 NOTE — Telephone Encounter (Signed)
 Received a secure chat that patient is in Rehab and want be out until next week.  I called and spoke with patient to inform him per Dr. Tina, once he reschedules his PET scan he will need to call us  and reschedule his appointment with Dr. Tina.  Patient verbalized understanding.

## 2024-06-05 ENCOUNTER — Inpatient Hospital Stay

## 2024-06-08 ENCOUNTER — Inpatient Hospital Stay: Admitting: Nurse Practitioner

## 2024-06-09 ENCOUNTER — Telehealth: Payer: Self-pay

## 2024-06-09 NOTE — Telephone Encounter (Signed)
 Patient was recently DC from the hospital for a back injury and is now in rehab. He complains of back pain and has been told to stay in bed. Their is no clear reason why the patient called as he had no questions. We did discuss the need for a PET scan and to call us  when it got scheduled. Patient David Townsend of these instructions. Arland Legions BSN RN

## 2024-06-09 NOTE — Telephone Encounter (Signed)
 Patient called and asked for a call back. No other information. Arland Legions BSN RN

## 2024-06-15 NOTE — Progress Notes (Signed)
 RN spoke with patient, he remains in rehab at [facility].  RN will follow up next week to review any discharge plans and coordinate for [facility] PET and MD follow up.  RN provided my direct number.

## 2024-06-25 ENCOUNTER — Telehealth: Payer: Self-pay | Admitting: Family

## 2024-06-25 NOTE — Telephone Encounter (Signed)
 Copied from CRM #[id number]. Topic: General - Other >> Jun 25, 2024  9:46 AM Winona SAUNDERS wrote: Olam from [facility] calling to verify if NP Amy Jaycee will oversee the pt care with them as he is being discharged from rehab today. Olam would also like to know if the pt will need a follow in office before they start their care. Call back [phone] feel free to ask for [facility]

## 2024-06-25 NOTE — Telephone Encounter (Signed)
 Please advise.

## 2024-06-26 ENCOUNTER — Telehealth: Payer: Self-pay

## 2024-06-26 NOTE — Transitions of Care (Post Inpatient/ED Visit) (Signed)
 "  06/26/2024  Name: David Townsend MRN: [id number] DOB: [birth year]  Today's TOC FU Call Status: Today's TOC FU Call Status:: Successful TOC FU Call Completed Unsuccessful Call (1st Attempt) Date: 06/26/24 [facility] FU Call Complete Date: 06/26/24  Patient's Name and Date of Birth confirmed. DOB, Name  Transition Care Management Follow-up Telephone Call Date of Discharge: 06/25/24 Discharge Facility: Other [facility]) Name of Other (Non[facility]) Discharge Facility: [facility] Type of Discharge: Inpatient Admission Primary Inpatient Discharge Diagnosis:: sepsis How have you been since you were released from the hospital?: Same Any questions or concerns?: No  Items Reviewed: Did you receive and understand the discharge instructions provided?: Yes Medications obtained,verified, and reconciled?: Yes (Medications Reviewed) Any new allergies since your discharge?: No Dietary orders reviewed?: Yes Do you have support at home?: No  Medications Reviewed Today: Medications Reviewed Today     Reviewed by Emmitt Pan, LPN (Licensed Practical Nurse) on 06/26/24 at 1013  Med List Status: <None>   Medication Order Taking? Sig Documenting Provider Last Dose Status Informant  bisacodyl  (DULCOLAX) 10 MG suppository [id number]  Place 1 suppository (10 mg total) rectally daily as needed for severe constipation.  Patient not taking: Reported on 06/26/2024   Cheryle Page, MD  Active   buprenorphine (BUTRANS) 5 MCG/HR PTWK [id number] Yes 1 patch once a week. [provider]  Active   dexamethasone  (DECADRON ) 4 MG tablet [id number] Yes Take 1 tablet (4 mg total) by mouth daily. Cheryle Page, MD  Active   ERLEADA  60 MG tablet [id number] Yes Take 120-180 mg by mouth daily. [provider]  Active Spouse/Significant Other, Multiple Informants, Pharmacy Records           Med Note MARISA, NATHANEL LOISE Kitchens Apr 13, 2024  7:27 PM) The patient's girlfriend questions this, but he told me is has  been taking these hit and miss and that he takes 2-3 tablets (120-180 mg) on the days he DOES take them.  folic acid  (FOLVITE ) 1 MG tablet [id number] Yes Take 1 tablet (1 mg total) by mouth daily. Jaycee Greig PARAS, NP  Active Spouse/Significant Other, Multiple Informants, Pharmacy Records  ibuprofen  (ADVIL ) 800 MG tablet [id number] Yes Take 1 tablet (800 mg total) by mouth every 8 (eight) hours as needed for fever, mild pain (pain score 1-3) or headache (alternate with tylenol  for fever). Jaycee Greig PARAS, NP  Active Spouse/Significant Other, Multiple Informants, Pharmacy Records  Iron , Ferrous Sulfate , 325 (65 Fe) MG TABS [id number] Yes Take 325 mg by mouth daily. Jaycee Greig PARAS, NP  Active Multiple Informants, Spouse/Significant Other, Pharmacy Records  lidocaine  (LIDODERM ) 5 % [id number] Yes Place 1 patch onto the skin daily. Remove & Discard patch within 12 hours or as directed by MD Jaycee Greig PARAS, NP  Active Spouse/Significant Other, Multiple Informants, Pharmacy Records  methocarbamol  (ROBAXIN ) 500 MG tablet [id number] Yes Take 1 tablet (500 mg total) by mouth every 8 (eight) hours as needed for muscle spasms. Jaycee Greig PARAS, NP  Active Spouse/Significant Other, Multiple Informants, Pharmacy Records  morphine  (MS CONTIN ) 60 MG 12 hr tablet [id number] Yes Take 1 tablet (60 mg total) by mouth every 12 (twelve) hours. Cheryle Page, MD  Active   morphine  (MSIR) 15 MG tablet [id number] Yes Take 1 tablet (15 mg total) by mouth every 6 (six) hours as needed for severe pain (pain score 7-10) or moderate pain (pain score 4-6). Cheryle Page, MD  Active   Multiple Vitamins-Minerals (MULTIVITAMIN WITH MINERALS) tablet [id number] Yes Take 1 tablet by  mouth daily. Jaycee Greig PARAS, NP  Active Spouse/Significant Other, Multiple Informants, Pharmacy Records  polyethylene glycol (MIRALAX  / GLYCOLAX ) 17 g packet [id number] Yes Take 17 g by mouth 2 (two) times daily.  Patient taking differently: Take 17 g by mouth daily as needed  for mild constipation or moderate constipation.   Jaycee Greig PARAS, NP  Active Spouse/Significant Other, Multiple Informants, Pharmacy Records  senna-docusate (SENOKOT-S) 8.6-50 MG tablet [id number]  Take 2 tablets by mouth 2 (two) times daily.  Patient not taking: Reported on 06/26/2024   Jaycee Greig PARAS, NP  Active Spouse/Significant Other, Multiple Informants, Pharmacy Records  sodium chloride  1 g tablet [id number] Yes Take 1 tablet (1 g total) by mouth 3 (three) times daily with meals. Cheryle Page, MD  Active   thiamine  (VITAMIN B1) 100 MG tablet [id number] Yes Take 1 tablet (100 mg total) by mouth daily. Jaycee Greig PARAS, NP  Active Spouse/Significant Other, Multiple Informants, Pharmacy Records  TYLENOL  8 HOUR ARTHRITIS PAIN 650 MG CR tablet [id number] Yes Take 2 tablets (1,300 mg total) by mouth 2 (two) times daily as needed for pain. Jaycee Greig PARAS, NP  Active Spouse/Significant Other, Multiple Informants, Pharmacy Records            Home Care and Equipment/Supplies: Were Home Health Services Ordered?: Yes Name of Home Health Agency:: unknown Has Agency set up a time to come to your home?: No EMR reviewed for Home Health Orders: Home Health Not Ordered Any new equipment or medical supplies ordered?: NA  Functional Questionnaire: Do you need assistance with bathing/showering or dressing?: No Do you need assistance with meal preparation?: No Do you need assistance with eating?: No Do you have difficulty maintaining continence: No Do you need assistance with getting out of bed/getting out of a chair/moving?: No Do you have difficulty managing or taking your medications?: No  Follow up appointments reviewed: PCP Follow-up appointment confirmed?: NA Specialist Hospital Follow-up appointment confirmed?: No Reason Specialist Follow-Up Not Confirmed: Patient has Specialist Provider Number and will Call for Appointment Do you need transportation to your follow-up appointment?: No Do you  understand care options if your condition(s) worsen?: Yes-patient verbalized understanding    SIGNATURE Julian Lemmings, LPN [facility] Nurse Health Advisor Direct Dial [phone]  "

## 2024-06-26 NOTE — Transitions of Care (Post Inpatient/ED Visit) (Signed)
" ° °  06/26/2024  Name: David Townsend MRN: [id number] DOB: 12/31/1960  Today's TOC FU Call Status: Today's TOC FU Call Status:: Unsuccessful Call (1st Attempt) Unsuccessful Call (1st Attempt) Date: 06/26/24  Attempted to reach the patient regarding the most recent Inpatient/ED visit.  Follow Up Plan: Additional outreach attempts will be made to reach the patient to complete the Transitions of Care (Post Inpatient/ED visit) call.   Signature Julian Lemmings, LPN [facility] Nurse Health Advisor Direct Dial [phone]  "

## 2024-06-26 NOTE — Telephone Encounter (Signed)
 Please specify which part of patient's care [facility] is referring to overseeing. Follow-up with primary provider as needed and/or recommended.

## 2024-06-30 NOTE — Telephone Encounter (Signed)
 Spoke to lisa from [facility] --after pt been to the hospital ordered PT and OT-but insurance have been denied  due to out of network, but in the process for Prior Authorization.

## 2024-06-30 NOTE — Telephone Encounter (Signed)
 Noted.

## 2024-07-01 ENCOUNTER — Telehealth: Payer: Self-pay

## 2024-07-01 ENCOUNTER — Other Ambulatory Visit: Payer: Self-pay | Admitting: Nurse Practitioner

## 2024-07-01 DIAGNOSIS — C61 Malignant neoplasm of prostate: Secondary | ICD-10-CM

## 2024-07-01 DIAGNOSIS — C7951 Secondary malignant neoplasm of bone: Secondary | ICD-10-CM

## 2024-07-01 DIAGNOSIS — Z515 Encounter for palliative care: Secondary | ICD-10-CM

## 2024-07-01 DIAGNOSIS — K5903 Drug induced constipation: Secondary | ICD-10-CM

## 2024-07-01 MED ORDER — LUBIPROSTONE 8 MCG PO CAPS: 8.0000 ug | ORAL_CAPSULE | Freq: Two times a day (BID) | ORAL | 0 refills | Status: AC

## 2024-07-01 NOTE — Telephone Encounter (Signed)
 Pt called reporting no BM since Monday 1/26. Pt reports that he has ongoing constipation that is worsened by his narcotics despite use of mirilax. Pt requested Movantik to help with constipation, see orders for details.  Patient reports that he is now home from rehab and is doing well, he has a scheduled visit with [facility] on 2/11, pt made aware that he cannot see both [facility] palliative and another provider for pain management, pt verbalized understanding. No questions regarding medications, no further needs at this time. Pt knows to call the office with any changes, questions, or updates.

## 2024-07-03 ENCOUNTER — Telehealth: Payer: Self-pay

## 2024-07-03 NOTE — Telephone Encounter (Signed)
 Called the patient to inform him that our physicians are not willing to try him with pain medication. When he was told he had stop drinking he states he still drinks daily. He asked what he needed to do. I explained he needs to stop drinking now make an appointment for lab testing and to speak with palliative care and Dr Tina. Patient was receptive to all my suggestions, states he has trouble with transportation explained we will help with that to come to his appointment. States he will get himself together and call in a few days. Arland Legions BSN RN

## 2024-07-03 NOTE — Telephone Encounter (Signed)
 Spoke with the patient he states he is out of his MS contin  tomorrow only has 2 tablets left. He states it has been  renewed by this office I can not find any evidence of MS contin  being order by this office. Message sent to Dr Tina and Levon Borer. Arland Legions BSN RN

## 2024-07-07 ENCOUNTER — Other Ambulatory Visit: Payer: Self-pay

## 2024-07-07 ENCOUNTER — Telehealth: Payer: Self-pay | Admitting: Nurse Practitioner

## 2024-07-07 DIAGNOSIS — Z515 Encounter for palliative care: Secondary | ICD-10-CM

## 2024-07-07 DIAGNOSIS — C61 Malignant neoplasm of prostate: Secondary | ICD-10-CM

## 2024-07-08 ENCOUNTER — Inpatient Hospital Stay

## 2024-07-08 ENCOUNTER — Inpatient Hospital Stay: Admitting: Nurse Practitioner

## 2024-07-09 ENCOUNTER — Telehealth: Payer: Self-pay

## 2024-07-09 NOTE — Telephone Encounter (Signed)
 Scheduled patient for scan review appt on 2/20. Called and spoke with the patient, he is aware.

## 2024-07-13 ENCOUNTER — Inpatient Hospital Stay

## 2024-07-16 ENCOUNTER — Encounter (HOSPITAL_COMMUNITY)

## 2024-07-22 ENCOUNTER — Inpatient Hospital Stay

## 2024-07-24 ENCOUNTER — Inpatient Hospital Stay
